# Patient Record
Sex: Female | Born: 1945
Health system: Southern US, Community
[De-identification: ages and names within clinical notes are randomized; demographics above are authoritative.]

## PROBLEM LIST (undated history)

## (undated) ENCOUNTER — Emergency Department (HOSPITAL_COMMUNITY): Admission: EM | Payer: BC Managed Care – PPO | Source: Home / Self Care

## (undated) DIAGNOSIS — M4310 Spondylolisthesis, site unspecified: Secondary | ICD-10-CM

## (undated) DIAGNOSIS — I10 Essential (primary) hypertension: Secondary | ICD-10-CM

## (undated) DIAGNOSIS — L309 Dermatitis, unspecified: Secondary | ICD-10-CM

## (undated) DIAGNOSIS — N8501 Benign endometrial hyperplasia: Secondary | ICD-10-CM

## (undated) DIAGNOSIS — I341 Nonrheumatic mitral (valve) prolapse: Secondary | ICD-10-CM

## (undated) DIAGNOSIS — H269 Unspecified cataract: Secondary | ICD-10-CM

## (undated) DIAGNOSIS — E7800 Pure hypercholesterolemia, unspecified: Secondary | ICD-10-CM

## (undated) DIAGNOSIS — M7989 Other specified soft tissue disorders: Secondary | ICD-10-CM

## (undated) DIAGNOSIS — H409 Unspecified glaucoma: Secondary | ICD-10-CM

## (undated) DIAGNOSIS — K219 Gastro-esophageal reflux disease without esophagitis: Secondary | ICD-10-CM

## (undated) DIAGNOSIS — E119 Type 2 diabetes mellitus without complications: Secondary | ICD-10-CM

## (undated) DIAGNOSIS — C801 Malignant (primary) neoplasm, unspecified: Secondary | ICD-10-CM

## (undated) HISTORY — DX: Essential (primary) hypertension: I10

## (undated) HISTORY — DX: Benign endometrial hyperplasia: N85.01

## (undated) HISTORY — DX: Unspecified glaucoma: H40.9

## (undated) HISTORY — PX: OTHER SURGICAL HISTORY: SHX169

## (undated) HISTORY — DX: Unspecified cataract: H26.9

## (undated) HISTORY — DX: Pure hypercholesterolemia, unspecified: E78.00

## (undated) HISTORY — DX: Type 2 diabetes mellitus without complications: E11.9

## (undated) HISTORY — PX: MOUTH SURGERY: SHX715

## (undated) HISTORY — DX: Spondylolisthesis, site unspecified: M43.10

---

## 2004-09-27 ENCOUNTER — Emergency Department (HOSPITAL_COMMUNITY): Admission: EM | Admit: 2004-09-27 | Discharge: 2004-09-28 | Payer: Self-pay | Admitting: Emergency Medicine

## 2004-10-15 ENCOUNTER — Encounter: Admission: RE | Admit: 2004-10-15 | Discharge: 2005-01-13 | Payer: Self-pay | Admitting: Sports Medicine

## 2005-01-14 ENCOUNTER — Encounter: Admission: RE | Admit: 2005-01-14 | Discharge: 2005-01-17 | Payer: Self-pay | Admitting: Sports Medicine

## 2010-06-12 ENCOUNTER — Encounter: Admission: RE | Admit: 2010-06-12 | Discharge: 2010-06-12 | Payer: Self-pay | Admitting: Orthopedic Surgery

## 2010-08-08 ENCOUNTER — Encounter: Admission: RE | Admit: 2010-08-08 | Discharge: 2010-08-08 | Payer: Self-pay | Admitting: Family Medicine

## 2010-09-18 ENCOUNTER — Ambulatory Visit (HOSPITAL_COMMUNITY): Admission: RE | Admit: 2010-09-18 | Discharge: 2010-09-18 | Payer: Self-pay | Admitting: Neurological Surgery

## 2011-02-05 LAB — GLUCOSE, CAPILLARY: Glucose-Capillary: 162 mg/dL — ABNORMAL HIGH (ref 70–99)

## 2011-10-02 IMAGING — CT CT L SPINE W/ CM
3 of 9 series · 12 of 33 positions shown, 14 images · non-contrast
Comparison: CT lumbar spine 08/08/2010.

CLINICAL DATA: Back and leg pain.

LUMBAR MYELOGRAM AND POST MYELOGRAM CT
MYELOGRAM LUMBAR
TECHNIQUE: Contrast was injected by Dr.Tuferu at the L1-2 level.
Contrast was negotiated into the lumbar spine without difficulty.
Fluoroscopy time:  1.1-minute
TECHNIQUE: Multidetector CT imaging of the lumbar spine was
performed following myelography.  Multiplanar CT image
reconstructions were also generated.

[Series 4: 2mm axial soft tissue · axial · 0.29mm/px · z∈[-315,-195]mm · 4 of 102 slices shown]
[im 21/102  soft-tissue]
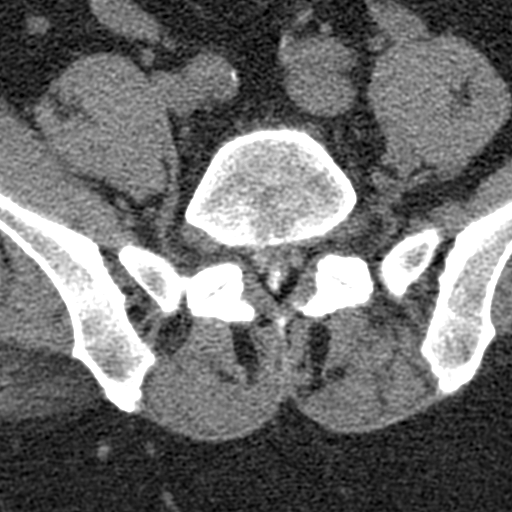
[im 41/102  soft-tissue]
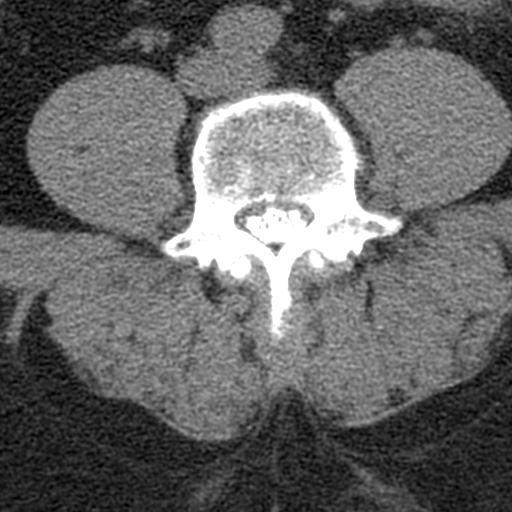
[im 61/102  soft-tissue]
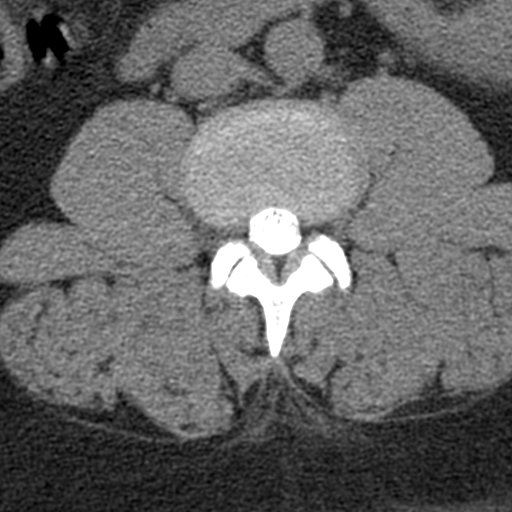
[im 81/102  soft-tissue]
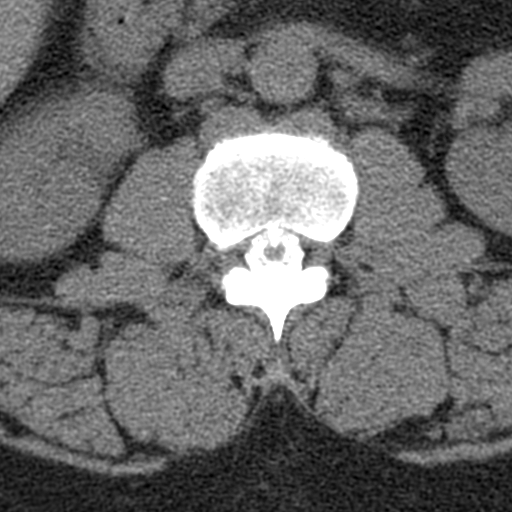

[Series 603: disc space st · axial · 0.27mm/px · z∈[-359,-253]mm · 3 of 55 slices shown, 4 images]
[im 1/55  soft-tissue]
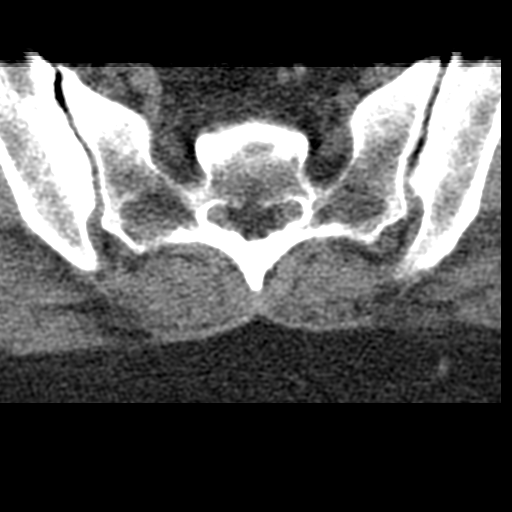
[im 1/55  bone]
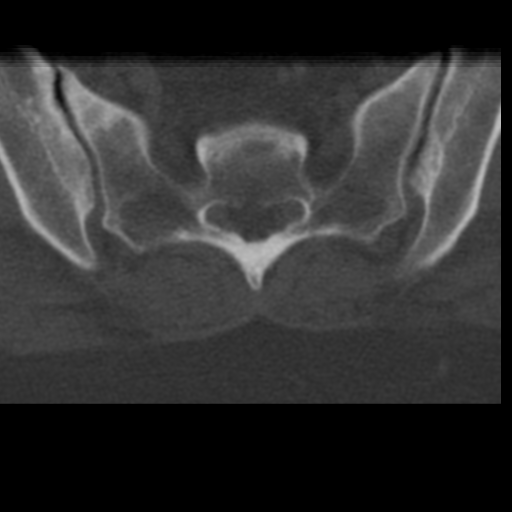
[im 28/55  bone]
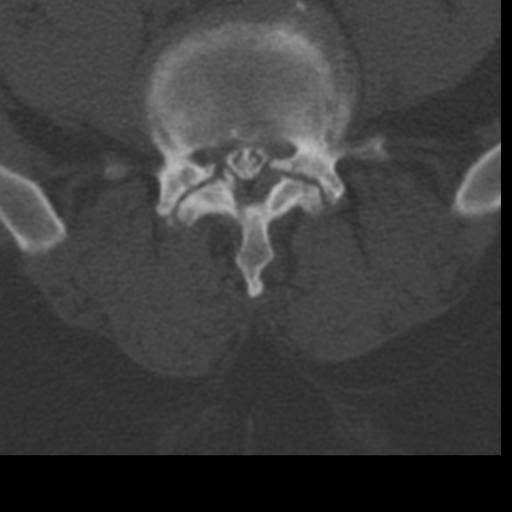
[im 55/55  bone]
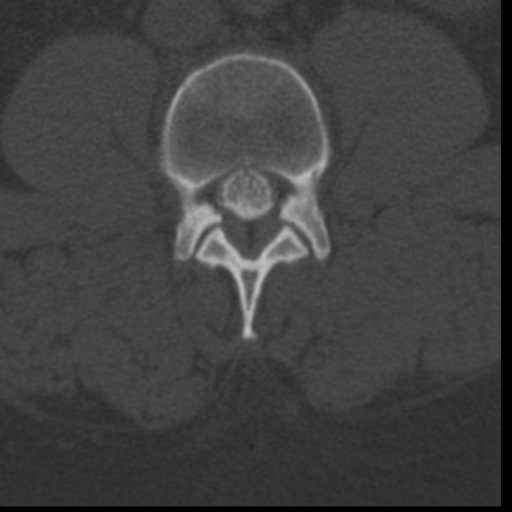

[Series 606: st sag · sagittal · 0.40mm/px · 5 of 44 slices shown, 6 images]
[im 15/44  bone]
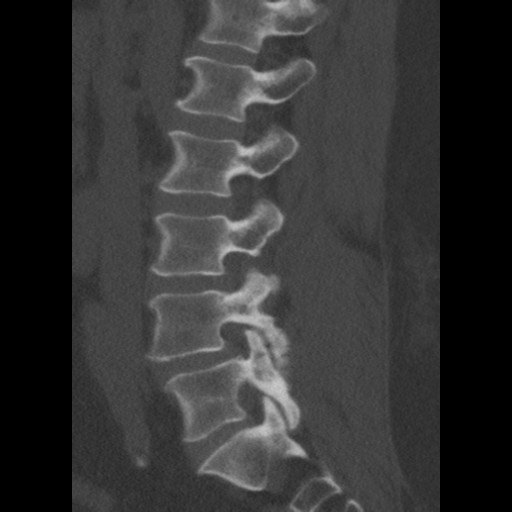
[im 18/44  bone]
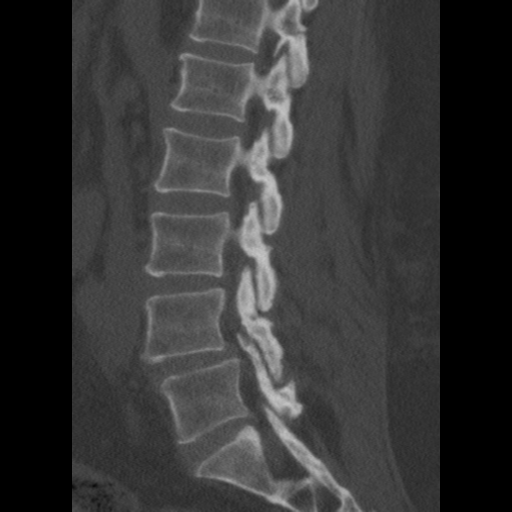
[im 22/44  soft-tissue]
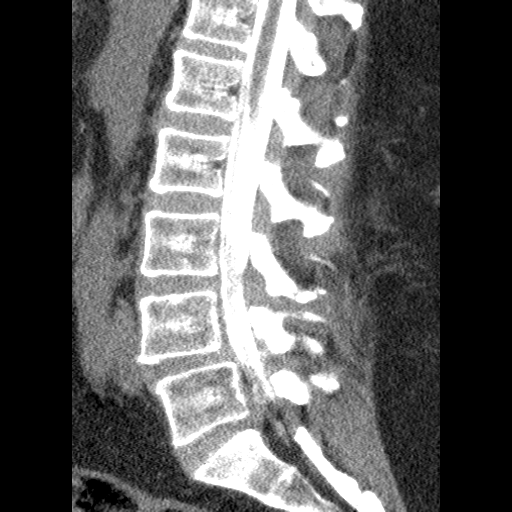
[im 22/44  bone]
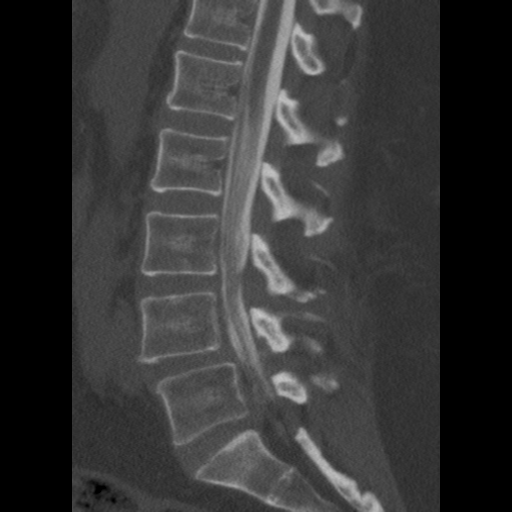
[im 26/44  bone]
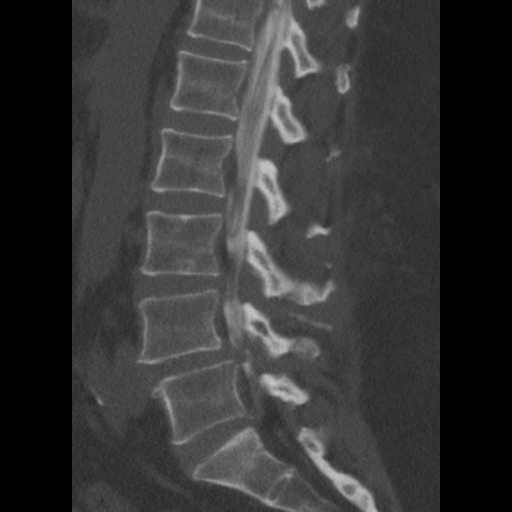
[im 29/44  bone]
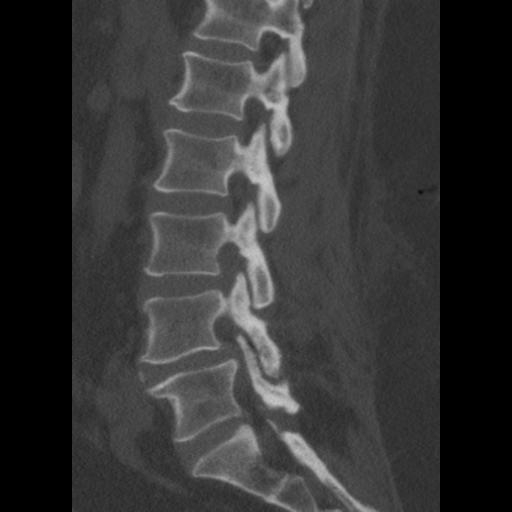

[12 of 33 positions shown; findings below may reference images not displayed]

FINDINGS: Mild anterior slip of L4.

No abnormal motion between flexion and extension.

Mild circumferential narrowing L3-4.

Impression upon the lateral aspect of the thecal sac at the L4-5
level and underfilling of the L5 nerve roots more notable on the
left.
IMPRESSION: Mild anterior slip of L4.

No abnormal motion between flexion and extension.

Mild circumferential narrowing L3-4.

Impression upon the lateral aspect of the thecal sac at the L4-5
level and underfilling of the L5 nerve roots more notable on the
left.

CT MYELOGRAPHY LUMBAR SPINE
FINDINGS: Last fully open disc space is labeled L5-S1.  Present
examination incorporates from T12 through the mid to lower sacrum.
Conus just below the L1-2 disc space.

Iliac arteries and lower abdominal aorta calcifications.

T12-L1:  Negative.

L1-2:  Negative.

L2-3:  Mild  ligamentum flavum hypertrophy.  Slight prominence
ventral epidural veins.  Mild prominence epidural fat.  Very mild
narrowing of the thecal sac.

L3-4:  Mild bilateral facet joint degenerative changes.  Moderate
ligamentum flavum hypertrophy.  Moderate bulge.  Moderate bilateral
foraminal narrowing.  Mild to moderate spinal stenosis slightly
greater on the left.

L4-5:  Moderate bilateral facet joint degenerative changes.
Anterior slip of L4 by 4.8 mm.  Ligament flavum hypertrophy.
Moderate bulge slightly greater to the left.  Moderate bilateral
lateral recess stenosis greater on the left.  Moderate spinal
stenosis.  Mild to moderate bilateral foraminal narrowing.

L5-S1:  Mild bilateral facet joint degenerative changes.  Mild
bulge.  Very mild bilateral foraminal narrowing spinal stenosis.
IMPRESSION: L3-4 mild bilateral facet joint degenerative changes.  Moderate
ligamentum flavum hypertrophy.  Moderate bulge.  Moderate bilateral
foraminal narrowing.  Mild to moderate spinal stenosis slightly
greater on the left.

L4-5 moderate bilateral facet joint degenerative changes.  Anterior
slip of L4 by 4.8 mm.  Ligament flavum hypertrophy.  Moderate bulge
slightly greater to the left.  Moderate bilateral  lateral recess
stenosis greater on the left.  Moderate spinal stenosis.  Mild to
moderate bilateral foraminal narrowing.

L5-S1 mild bulge.  Very mild bilateral foraminal narrowing spinal
stenosis.

Please see above.

## 2013-05-13 ENCOUNTER — Ambulatory Visit
Admission: RE | Admit: 2013-05-13 | Discharge: 2013-05-13 | Disposition: A | Discharge status: Home / Self Care | Payer: BC Managed Care – PPO | Source: Ambulatory Visit | Attending: Family Medicine | Admitting: Family Medicine

## 2013-05-13 ENCOUNTER — Other Ambulatory Visit: Payer: Self-pay | Admitting: Family Medicine

## 2013-05-13 DIAGNOSIS — M25572 Pain in left ankle and joints of left foot: Secondary | ICD-10-CM

## 2015-01-16 ENCOUNTER — Ambulatory Visit
Admission: RE | Admit: 2015-01-16 | Discharge: 2015-01-16 | Disposition: A | Discharge status: Home / Self Care | Payer: BC Managed Care – PPO | Source: Ambulatory Visit | Attending: Family Medicine | Admitting: Family Medicine

## 2015-01-16 ENCOUNTER — Other Ambulatory Visit: Payer: Self-pay | Admitting: Family Medicine

## 2015-01-16 DIAGNOSIS — G542 Cervical root disorders, not elsewhere classified: Secondary | ICD-10-CM

## 2016-05-14 ENCOUNTER — Ambulatory Visit: Payer: Self-pay | Admitting: Gynecology

## 2016-06-06 ENCOUNTER — Encounter: Payer: Self-pay | Admitting: Gynecology

## 2016-06-06 ENCOUNTER — Ambulatory Visit (INDEPENDENT_AMBULATORY_CARE_PROVIDER_SITE_OTHER): Payer: BC Managed Care – PPO | Admitting: Gynecology

## 2016-06-06 VITALS — BP 124/76 | Ht 59.0 in | Wt 222.0 lb

## 2016-06-06 DIAGNOSIS — Z124 Encounter for screening for malignant neoplasm of cervix: Secondary | ICD-10-CM

## 2016-06-06 DIAGNOSIS — N841 Polyp of cervix uteri: Secondary | ICD-10-CM

## 2016-06-06 DIAGNOSIS — N950 Postmenopausal bleeding: Secondary | ICD-10-CM | POA: Diagnosis not present

## 2016-06-06 DIAGNOSIS — N9089 Other specified noninflammatory disorders of vulva and perineum: Secondary | ICD-10-CM | POA: Diagnosis not present

## 2016-06-06 NOTE — Progress Notes (Signed)
Erica [name] [birth year] [id number]        70 y.o.  G2P2  new patient referred due to postmenopausal bleeding. Patient notes it's been years since she has had a gynecologic exam. Over the past 8 months or so she has noticed spotting on and off. Light, never heavy and no associated cramping or pelvic discomfort. Also notes a growth on her left perineal region. Has progressively gotten larger over the last several months. No history of significant gynecologic abnormalities in the past.  No significant menopausal symptoms such as hot flushes night sweats or vaginal dryness.  Had ultrasound ordered by her primary physician due to the postmenopausal bleeding which shows the uterus with a 9 mm endometrial echo. Right left ovaries not visualized. No adnexal pathology noted.  Past medical history,surgical history, problem list, medications, allergies, family history and social history were all reviewed and documented as reviewed in the EPIC chart.  ROS:  Performed with pertinent positives and negatives included in the history, assessment and plan.   Additional significant findings :  None   Exam: [name] assistant Filed Vitals:   06/06/16 1032  BP: 124/76  Height: 4\' 11"  (1.499 m)  Weight: 222 lb (100.699 kg)   General appearance:  Normal affect, orientation and appearance. Skin: Grossly normal HEENT: Without gross lesions.  No cervical or supraclavicular adenopathy. Thyroid normal.  Lungs:  Clear without wheezing, rales or rhonchi Cardiac: RR, without RMG Abdominal:  Soft, nontender, without masses, guarding, rebound, organomegaly or hernia Breasts:  Without masses, retractions, discharge or axillary adenopathy. Pelvic:  Ext/BUS/Vagina with atrophic changes. 3 cm pedunculated fleshy mass crease of the left gluteal fold and thigh consistent with lipoma.  Cervix with endocervical polyp extruding from the os. Polyp was biopsied off and sent to pathology. Pap smear done  Uterus unable  to palpate due to abdominal girth but no gross masses or tenderness   Adnexa without gross masses or tenderness    Anus and perineum normal   Rectovaginal normal sphincter tone without palpated masses or tenderness.    Assessment/Plan:  70 y.o. G2P2 female with history as above.  1. Postmenopausal bleeding. The question is whether this is from the cervical polyp or is from other pathology higher up. Endometrial echo generous at 9 mm. Recommended follow up with sonohysterogram to rule out endometrial pathology and patient agrees to schedule. Various scenarios and differential reviewed to include hyperplasia and cancer. Patient will schedule her sonohysterogram and follow up for this.  We will call the patient with the endocervical polyp pathology results in several days. 2. Fleshy mass left crease of groin consistent with lipoma. Bothersome to the patient. Will plan to excise either intraoperatively if patient and supple ultimately having hysteroscopy D&C or in the office if we clear her endometrium. Will require several sutures. 3. Pap smear done today. No history of abnormal Pap smears previously. Patient reports it has been "years" since her last Pap smear. 4. Mammography 10/2015. Continue with annual mammography when due. SBE monthly reviewed. 5. Colonoscopy never. I strongly recommended patient schedule a screening colonoscopy. Second-most common cancer in women stressed. Patient acknowledges my recommendation. She will not commit to scheduling now though. 6. DEXA never. Offered the schedule today but patient declined. Again she understands my recommendation but does not commit to schedule at this point. 7. Health maintenance. No routine lab work done as this is done elsewhere. Follow up for the sonohysterogram as scheduled and the cervical polyp pathology results.  Anastasio Auerbach MD, 11:15 AM 06/06/2016

## 2016-06-06 NOTE — Addendum Note (Signed)
Addended by: Nelva Nay on: 06/06/2016 12:04 PM   Modules accepted: Orders

## 2016-06-06 NOTE — Patient Instructions (Signed)
Follow up for the sonohysterogram as scheduled.  Office will call you with the biopsy results from the cervical polyp I removed.

## 2016-06-10 LAB — PATHOLOGY

## 2016-06-10 LAB — PAP IG W/ RFLX HPV ASCU

## 2016-06-11 ENCOUNTER — Other Ambulatory Visit: Payer: Self-pay | Admitting: Gynecology

## 2016-06-11 DIAGNOSIS — N950 Postmenopausal bleeding: Secondary | ICD-10-CM

## 2016-06-17 ENCOUNTER — Telehealth: Payer: Self-pay | Admitting: Gynecology

## 2016-06-17 NOTE — Telephone Encounter (Signed)
06/17/16-I tried to reach pt today but no answering machine on home phone. I wanted to advised her that her [facility] ins will cover the sonohysterogram/bx if needed with her $25 copay. Per [email or web address] [phone]

## 2016-06-27 ENCOUNTER — Ambulatory Visit (INDEPENDENT_AMBULATORY_CARE_PROVIDER_SITE_OTHER): Payer: BC Managed Care – PPO

## 2016-06-27 ENCOUNTER — Encounter: Payer: Self-pay | Admitting: Gynecology

## 2016-06-27 ENCOUNTER — Ambulatory Visit (INDEPENDENT_AMBULATORY_CARE_PROVIDER_SITE_OTHER): Payer: BC Managed Care – PPO | Admitting: Gynecology

## 2016-06-27 ENCOUNTER — Other Ambulatory Visit: Payer: Self-pay | Admitting: Gynecology

## 2016-06-27 VITALS — BP 140/84

## 2016-06-27 DIAGNOSIS — N9489 Other specified conditions associated with female genital organs and menstrual cycle: Secondary | ICD-10-CM | POA: Diagnosis not present

## 2016-06-27 DIAGNOSIS — R9389 Abnormal findings on diagnostic imaging of other specified body structures: Secondary | ICD-10-CM

## 2016-06-27 DIAGNOSIS — N950 Postmenopausal bleeding: Secondary | ICD-10-CM

## 2016-06-27 DIAGNOSIS — R938 Abnormal findings on diagnostic imaging of other specified body structures: Secondary | ICD-10-CM | POA: Diagnosis not present

## 2016-06-27 DIAGNOSIS — N840 Polyp of corpus uteri: Secondary | ICD-10-CM

## 2016-06-27 DIAGNOSIS — L918 Other hypertrophic disorders of the skin: Secondary | ICD-10-CM

## 2016-06-27 NOTE — Progress Notes (Signed)
    Erica [name] 01/24/1946 [id number]        70 y.o.  G2P2 presents for sonohysterogram. History of post menopausal bleeding with ultrasound showing thickened endometrial echo. Recently had mixed endometrial/endocervical polyp removed from her cervix. Also has a large pedunculated skin tag from her inner upper left thigh that she wants removed.  Past medical history,surgical history, problem list, medications, allergies, family history and social history were all reviewed and documented in the EPIC chart.  Directed ROS with pertinent positives and negatives documented in the history of present illness/assessment and plan.  Exam: Erica [name] assistant Vitals:   06/27/16 1256  BP: 140/84   General appearance:  Normal Abdomen obese with no gross masses or tenderness Elevate external BUS vagina atrophic and tight admitting the thin long narrow speculum. Cervix atrophic. Uterus unable to palpate. Adnexa without gross masses or tenderness  Ultrasound shows uterus small with heterogeneous echo pattern. Endometrial echo 12 mm. Right and left ovaries atrophic and normal in appearance. Cul-de-sac negative.  Sonohysterogram performed, sterile technique, single-tooth tenaculum anterior lip stabilization, easy catheter introduction, good distention with 22 x 18 x 25 mm solid-appearing mass within the cavity consistent with polyp. Endometrial sample taken. Take patient tolerated well  Assessment/Plan:  70 y.o. G2P2 with postmenopausal bleeding and probable large endometrial polyp. Patient will follow up for biopsy results. Recommended proceeding with hysteroscopy D&C with resection of the polyp. Also resection of the large upper inner thigh polypoid mass. I reviewed in general's involved with both of these procedures and postoperative stitches discussed. Patient will follow up for pathology results. Patient will proceed to schedule her surgery. We'll have preoperative clearance by her primary  physician.    Anastasio Auerbach MD, 1:06 PM 06/27/2016

## 2016-06-27 NOTE — Patient Instructions (Signed)
Office will call you with biopsy results and to arrange surgery. 

## 2016-07-01 ENCOUNTER — Telehealth: Payer: Self-pay

## 2016-07-01 NOTE — Telephone Encounter (Signed)
I called and left message for patient to call me about scheduling surgery. 

## 2016-07-02 ENCOUNTER — Telehealth: Payer: Self-pay

## 2016-07-02 MED ORDER — MISOPROSTOL 200 MCG PO TABS: ORAL_TABLET | ORAL | 0 refills | Status: DC

## 2016-07-02 NOTE — Telephone Encounter (Signed)
I called patient to inform her that I scheduled her appt with her PCP Dr. Minette Brine for 07/13/16 at 12:15pm. Records have been faxed.    I am holding time for her surgeyr on Tues., Sept 12 and will determine time closer to date and let her know.   Pre op appt with Dr. Loetta Rough was scheduled.  I advised patient about the need for Cytotec tab vaginally hs before surgery and that Rx was sent to her pharmacy.  I did let her know that she might experience some cramping or light bleeding after using the Cytotec that evening and that is not a problem and just indicates the medication is working.

## 2016-07-10 ENCOUNTER — Telehealth: Payer: Self-pay

## 2016-07-10 NOTE — Telephone Encounter (Signed)
Patient called with some general questions about surgery that is scheduled. Questions were answered.

## 2016-07-25 DIAGNOSIS — N8501 Benign endometrial hyperplasia: Secondary | ICD-10-CM

## 2016-07-25 HISTORY — DX: Benign endometrial hyperplasia: N85.01

## 2016-07-30 ENCOUNTER — Telehealth: Payer: Self-pay

## 2016-07-30 ENCOUNTER — Ambulatory Visit (INDEPENDENT_AMBULATORY_CARE_PROVIDER_SITE_OTHER): Payer: BC Managed Care – PPO | Admitting: Gynecology

## 2016-07-30 VITALS — BP 134/80

## 2016-07-30 DIAGNOSIS — N840 Polyp of corpus uteri: Secondary | ICD-10-CM | POA: Diagnosis not present

## 2016-07-30 DIAGNOSIS — N950 Postmenopausal bleeding: Secondary | ICD-10-CM | POA: Diagnosis not present

## 2016-07-30 DIAGNOSIS — L989 Disorder of the skin and subcutaneous tissue, unspecified: Secondary | ICD-10-CM | POA: Diagnosis not present

## 2016-07-30 MED ORDER — OXYCODONE-ACETAMINOPHEN 5-325 MG PO TABS: 1.0000 | ORAL_TABLET | ORAL | 0 refills | Status: DC | PRN

## 2016-07-30 NOTE — Progress Notes (Signed)
Erica [name] 11-28-1945 [id number]   Preoperative consult  Chief complaint: Post menopausal bleeding, endometrial polyp, lipomatous skin mass left thigh  History of present illness: 70 y.o. G2P2 with history of postmenopausal bleeding over the past 9 months spotting on and off. Initial evaluation showed a cervical polyp which was removed and histologically was a mixed endocervical endometrial polyp.  Pap smear was also done which was normal.  She had a follow up sonohysterogram which showed a 22 x 18 x 25 mm solid-appearing mass intracavitary consistent with polyp.  Endometrial biopsy showed polypoid cystic atrophy.  Patient is admitted for hysteroscopy D&C with resection of the endometrial polyp. She also has a 3 cm fleshy pedunculated lipomatous mass upper inner left thigh that she would like removed. Has been present for years unchanged.  Past medical history,surgical history, medications, allergies, family history and social history were all reviewed and documented in the EPIC chart.  ROS:  Was performed and pertinent positives and negatives are included in the history of present illness.  Exam:  [name] assistant Vitals:   07/30/16 1030  BP: 134/80   General: well developed, well nourished female, no acute distress HEENT: normal  Lungs: clear to auscultation without wheezing, rales or rhonchi  Cardiac: regular rate without rubs, murmurs or gallops  Abdomen: soft, nontender without masses, guarding, rebound, organomegaly  Pelvic: external bus vagina: normal excepting 3 cm pedunculated fleshy lipomatous mass upper inner left thigh Cervix: grossly normal  Uterus: Difficult to palpate due to abdominal girth. No gross masses or tenderness  Adnexa: without gross masses or tenderness    Assessment/Plan:  70 y.o. G2P2 with postmenopausal bleeding initial evaluation showing a mixed endocervical/endometrial polyp. Follow up sonohysterogram showing a large endometrial polyp for  hysteroscopy D&C and resection of the endometrial polyp. Also has fleshy lipomatous mass upper inner left thigh that she would like removed.  I reviewed the proposed surgery with the patient to include the expected intraoperative and postoperative courses as well as the recovery period. The use of the hysteroscope, resectoscope and the D&C portion were all discussed. Excision of the mass with subsequent sutures and the need for postoperative care was discussed. The risks of surgery to include infection, prolonged antibiotics, hemorrhage necessitating transfusion and the risks of transfusion, including transfusion reaction, hepatitis, HIV, mad cow disease and other unknown entities were all discussed understood and accepted. The risk of damage to internal organs during the procedure, either immediately recognized or delay recognized, including vagina, cervix, uterus, possible perforation causing damage to bowel, bladder, ureters, vessels and nerves necessitating major exploratory reparative surgery and future reparative surgeries including bladder repair, ureteral damage repair, bowel resection, ostomy formation was also discussed understood and accepted. The potential for distended media absorption leading to metabolic complications such as fluid overload, coma and seizures was also discussed understood and accepted. She understands there are no guarantees that this will relieve her bleeding which may continue. The patient's questions were answered to her satisfaction and she is ready to proceed with surgery.  She was given a prescription for oxycodone/acetaminophen 5/325 #10 to have available for postoperative pain. Patient also saw her primary physician for preoperative clearance.    Anastasio Auerbach MD, 10:50 AM 07/30/2016

## 2016-07-30 NOTE — Telephone Encounter (Signed)
I called patient and per DPR access note left a detailed message on her work phone.  I called today to [place] to see if CPT codes [phone] and 11200 required prior authorization.  I was told that [phone] was a valid and billable code with no prior auth required but that 11200 was an uncovered service. Patient was informed of this.

## 2016-07-30 NOTE — Patient Instructions (Signed)
Your procedure is scheduled on:  Tuesday, Sept . 12, 2017  Enter through the [employer] [facility] at:  6:00 AM  Pick up the phone at the desk and dial [phone].  Call this number if you have problems the morning of surgery: [phone].  Remember: Do NOT eat food or drink after:  Midnight Monday  Take these medicines the morning of surgery with a SIP OF WATER:  Triamterene  Do NOT wear jewelry (body piercing), metal hair clips/bobby pins, make-up, or nail polish. Do NOT wear lotions, powders, or perfumes.  You may wear deodorant. Do NOT shave for 48 hours prior to surgery. Do NOT bring valuables to the hospital. Contacts, dentures, or bridgework may not be worn into surgery.  Have a responsible adult drive you home and stay with you for 24 hours after your procedure

## 2016-07-30 NOTE — H&P (Signed)
  Erica [name] 04-May-1946 [id number]   History and Physical  Chief complaint: Post menopausal bleeding, endometrial polyp, lipomatous skin mass left thigh  History of present illness: 70 y.o. G2P2 with history of postmenopausal bleeding over the past 9 months spotting on and off. Initial evaluation showed a cervical polyp which was removed and histologically was a mixed endocervical endometrial polyp.  Pap smear was also done which was normal.  She had a follow up sonohysterogram which showed a 22 x 18 x 25 mm solid-appearing mass intracavitary consistent with polyp.  Endometrial biopsy showed polypoid cystic atrophy.  Patient is admitted for hysteroscopy D&C with resection of the endometrial polyp. She also has a 3 cm fleshy pedunculated lipomatous mass upper inner left thigh that she would like removed. Has been present for years unchanged.  Past medical history,surgical history, medications, allergies, family history and social history were all reviewed and documented in the EPIC chart.  ROS:  Was performed and pertinent positives and negatives are included in the history of present illness.  Exam:  [name] assistant 07/30/2016 Vitals:   07/30/16 1030  BP: 134/80   General: well developed, well nourished female, no acute distress HEENT: normal  Lungs: clear to auscultation without wheezing, rales or rhonchi  Cardiac: regular rate without rubs, murmurs or gallops  Abdomen: soft, nontender without masses, guarding, rebound, organomegaly  Pelvic: external bus vagina: normal excepting 3 cm pedunculated fleshy lipomatous mass upper inner left thigh Cervix: grossly normal  Uterus: Difficult to palpate due to abdominal girth. No gross masses or tenderness  Adnexa: without gross masses or tenderness    Assessment/Plan:  70 y.o. G2P2 with postmenopausal bleeding initial evaluation showing a mixed endocervical/endometrial polyp. Follow up sonohysterogram showing a large endometrial polyp  for hysteroscopy D&C and resection of the endometrial polyp. Also has fleshy lipomatous mass upper inner left thigh that she would like removed.  I reviewed the proposed surgery with the patient to include the expected intraoperative and postoperative courses as well as the recovery period. The use of the hysteroscope, resectoscope and the D&C portion were all discussed. Excision of the mass with subsequent sutures and the need for postoperative care was discussed. The risks of surgery to include infection, prolonged antibiotics, hemorrhage necessitating transfusion and the risks of transfusion, including transfusion reaction, hepatitis, HIV, mad cow disease and other unknown entities were all discussed understood and accepted. The risk of damage to internal organs during the procedure, either immediately recognized or delay recognized, including vagina, cervix, uterus, possible perforation causing damage to bowel, bladder, ureters, vessels and nerves necessitating major exploratory reparative surgery and future reparative surgeries including bladder repair, ureteral damage repair, bowel resection, ostomy formation was also discussed understood and accepted. The potential for distended media absorption leading to metabolic complications such as fluid overload, coma and seizures was also discussed understood and accepted. She understands there are no guarantees that this will relieve her bleeding which may continue. The patient's questions were answered to her satisfaction and she is ready to proceed with surgery.  She was given a prescription for oxycodone/acetaminophen 5/325 #10 to have available for postoperative pain. Patient also saw her primary physician for preoperative clearance.   Anastasio Auerbach MD, 10:58 AM 07/30/2016

## 2016-07-30 NOTE — Patient Instructions (Signed)
Followup for surgery as scheduled. 

## 2016-07-31 ENCOUNTER — Telehealth: Payer: Self-pay

## 2016-07-31 ENCOUNTER — Other Ambulatory Visit: Payer: Self-pay

## 2016-07-31 ENCOUNTER — Encounter (HOSPITAL_COMMUNITY): Payer: Self-pay

## 2016-07-31 ENCOUNTER — Encounter (HOSPITAL_COMMUNITY)
Admission: RE | Admit: 2016-07-31 | Discharge: 2016-07-31 | Disposition: A | Discharge status: Home / Self Care | Payer: BC Managed Care – PPO | Source: Ambulatory Visit | Attending: Gynecology | Admitting: Gynecology

## 2016-07-31 DIAGNOSIS — Z01818 Encounter for other preprocedural examination: Secondary | ICD-10-CM | POA: Diagnosis present

## 2016-07-31 HISTORY — DX: Nonrheumatic mitral (valve) prolapse: I34.1

## 2016-07-31 HISTORY — DX: Gastro-esophageal reflux disease without esophagitis: K21.9

## 2016-07-31 HISTORY — DX: Other specified soft tissue disorders: M79.89

## 2016-07-31 HISTORY — DX: Dermatitis, unspecified: L30.9

## 2016-07-31 LAB — COMPREHENSIVE METABOLIC PANEL
ALT: 30 U/L (ref 14–54)
ANION GAP: 9 (ref 5–15)
AST: 26 U/L (ref 15–41)
Albumin: 4.6 g/dL (ref 3.5–5.0)
Alkaline Phosphatase: 63 U/L (ref 38–126)
BILIRUBIN TOTAL: 0.5 mg/dL (ref 0.3–1.2)
BUN: 13 mg/dL (ref 6–20)
CHLORIDE: 99 mmol/L — AB (ref 101–111)
CO2: 27 mmol/L (ref 22–32)
Calcium: 9.7 mg/dL (ref 8.9–10.3)
Creatinine, Ser: 0.75 mg/dL (ref 0.44–1.00)
Glucose, Bld: 285 mg/dL — ABNORMAL HIGH (ref 65–99)
POTASSIUM: 3.7 mmol/L (ref 3.5–5.1)
Sodium: 135 mmol/L (ref 135–145)
TOTAL PROTEIN: 7.5 g/dL (ref 6.5–8.1)

## 2016-07-31 LAB — CBC
HCT: 43.8 % (ref 36.0–46.0)
Hemoglobin: 15 g/dL (ref 12.0–15.0)
MCH: 28.6 pg (ref 26.0–34.0)
MCHC: 34.2 g/dL (ref 30.0–36.0)
MCV: 83.4 fL (ref 78.0–100.0)
PLATELETS: 183 10*3/uL (ref 150–400)
RBC: 5.25 MIL/uL — ABNORMAL HIGH (ref 3.87–5.11)
RDW: 13.1 % (ref 11.5–15.5)
WBC: 7.8 10*3/uL (ref 4.0–10.5)

## 2016-07-31 NOTE — Telephone Encounter (Signed)
Erica [name] saw this patient for pre op today. Hyst D&C and removal of lesion on thigh is scheduled for 08/05/16.   Patient told her she is a diet controlled diabetic.  Her random glucose today was 285.  I have put the note from her PCP on your desk to remind you.  He said "she tends to have poor compliance with her medications" HgbA1c was 11.1. "she promises she will work hard to take her medications regularly between now and her procedure." "I think she can be medically cleared for surgery, although her blood sugar should be closely monitored during admission".

## 2016-07-31 NOTE — Telephone Encounter (Signed)
I think she should be okay as this is a relatively minor procedure.

## 2016-07-31 NOTE — Pre-Procedure Instructions (Addendum)
Dr. Sharlene Motts made aware of Erica [name] glucose elevated glucose. He did not recommend surgery cancellation, however he does want her to follow up with her primary care provider for better glucose control long term. I also left a message for Juliann Pulse at Dr. Zelphia Cairo office to make them aware as well.

## 2016-08-01 NOTE — Pre-Procedure Instructions (Signed)
Erica [name] returned call that she did receive the information in reference to Erica [name]'s elevated glucose.  She has faxed over medical clearance from primary care provider, that clearance has been place in Erica [name]'s chart.

## 2016-08-04 MED ORDER — METRONIDAZOLE IN NACL 5-0.79 MG/ML-% IV SOLN
500.0000 mg | INTRAVENOUS | Status: AC
  Administered 2016-08-05: 500 mg via INTRAVENOUS
  Filled 2016-08-04: qty 100

## 2016-08-04 MED ORDER — GENTAMICIN SULFATE 40 MG/ML IJ SOLN
5.0000 mg/kg | INTRAVENOUS | Status: AC
  Administered 2016-08-05: 112.5 mg via INTRAVENOUS
  Filled 2016-08-04: qty 12.25

## 2016-08-05 ENCOUNTER — Ambulatory Visit (HOSPITAL_COMMUNITY)
Admission: RE | Admit: 2016-08-05 | Discharge: 2016-08-05 | Disposition: A | Discharge status: Home / Self Care | Payer: BC Managed Care – PPO | Source: Ambulatory Visit | Attending: Gynecology | Admitting: Gynecology

## 2016-08-05 ENCOUNTER — Ambulatory Visit (HOSPITAL_COMMUNITY): Payer: BC Managed Care – PPO | Admitting: Anesthesiology

## 2016-08-05 ENCOUNTER — Encounter (HOSPITAL_COMMUNITY)
Admission: RE | Disposition: A | Discharge status: Home / Self Care | Payer: Self-pay | Source: Ambulatory Visit | Attending: Gynecology

## 2016-08-05 DIAGNOSIS — L989 Disorder of the skin and subcutaneous tissue, unspecified: Secondary | ICD-10-CM | POA: Diagnosis not present

## 2016-08-05 DIAGNOSIS — N950 Postmenopausal bleeding: Secondary | ICD-10-CM | POA: Diagnosis not present

## 2016-08-05 DIAGNOSIS — Z6841 Body Mass Index (BMI) 40.0 and over, adult: Secondary | ICD-10-CM | POA: Diagnosis not present

## 2016-08-05 DIAGNOSIS — L918 Other hypertrophic disorders of the skin: Secondary | ICD-10-CM | POA: Insufficient documentation

## 2016-08-05 DIAGNOSIS — N840 Polyp of corpus uteri: Secondary | ICD-10-CM | POA: Insufficient documentation

## 2016-08-05 HISTORY — PX: DILATATION & CURETTAGE/HYSTEROSCOPY WITH MYOSURE: SHX6511

## 2016-08-05 HISTORY — PX: EXCISION OF SKIN TAG: SHX6270

## 2016-08-05 LAB — GLUCOSE, CAPILLARY
Glucose-Capillary: 273 mg/dL — ABNORMAL HIGH (ref 65–99)
Glucose-Capillary: 243 mg/dL — ABNORMAL HIGH (ref 65–99)
Glucose-Capillary: 250 mg/dL — ABNORMAL HIGH (ref 65–99)

## 2016-08-05 SURGERY — DILATATION & CURETTAGE/HYSTEROSCOPY WITH MYOSURE
Anesthesia: General | Site: Uterus

## 2016-08-05 MED ORDER — INSULIN ASPART 100 UNIT/ML ~~LOC~~ SOLN
SUBCUTANEOUS | Status: AC
  Filled 2016-08-05: qty 1

## 2016-08-05 MED ORDER — ONDANSETRON HCL 4 MG/2ML IJ SOLN
INTRAMUSCULAR | Status: AC
  Filled 2016-08-05: qty 2

## 2016-08-05 MED ORDER — LIDOCAINE-EPINEPHRINE (PF) 1 %-1:200000 IJ SOLN
INTRAMUSCULAR | Status: AC
  Filled 2016-08-05: qty 30

## 2016-08-05 MED ORDER — INSULIN ASPART 100 UNIT/ML ~~LOC~~ SOLN
SUBCUTANEOUS | Status: DC | PRN
  Administered 2016-08-05: 8 [IU] via SUBCUTANEOUS

## 2016-08-05 MED ORDER — KETOROLAC TROMETHAMINE 30 MG/ML IJ SOLN
INTRAMUSCULAR | Status: AC
  Filled 2016-08-05: qty 1

## 2016-08-05 MED ORDER — PROMETHAZINE HCL 25 MG/ML IJ SOLN
6.2500 mg | INTRAMUSCULAR | Status: DC | PRN
Start: 2016-08-05 — End: 2016-08-05

## 2016-08-05 MED ORDER — PROPOFOL 10 MG/ML IV BOLUS
INTRAVENOUS | Status: DC | PRN
  Administered 2016-08-05 (×2): 100 mg via INTRAVENOUS

## 2016-08-05 MED ORDER — FENTANYL CITRATE (PF) 100 MCG/2ML IJ SOLN
INTRAMUSCULAR | Status: AC
  Filled 2016-08-05: qty 2

## 2016-08-05 MED ORDER — LIDOCAINE-EPINEPHRINE 1 %-1:100000 IJ SOLN
INTRAMUSCULAR | Status: AC
  Filled 2016-08-05: qty 1

## 2016-08-05 MED ORDER — PROPOFOL 10 MG/ML IV BOLUS
INTRAVENOUS | Status: AC
  Filled 2016-08-05: qty 20

## 2016-08-05 MED ORDER — LIDOCAINE HCL (CARDIAC) 20 MG/ML IV SOLN
INTRAVENOUS | Status: AC
  Filled 2016-08-05: qty 5

## 2016-08-05 MED ORDER — LIDOCAINE HCL 1 % IJ SOLN
INTRAMUSCULAR | Status: AC
  Filled 2016-08-05: qty 20

## 2016-08-05 MED ORDER — ONDANSETRON HCL 4 MG/2ML IJ SOLN
INTRAMUSCULAR | Status: DC | PRN
  Administered 2016-08-05: 4 mg via INTRAVENOUS

## 2016-08-05 MED ORDER — OXYCODONE HCL 5 MG/5ML PO SOLN: 5.0000 mg | Freq: Once | ORAL | Status: DC | PRN

## 2016-08-05 MED ORDER — KETOROLAC TROMETHAMINE 30 MG/ML IJ SOLN
INTRAMUSCULAR | Status: DC | PRN
  Administered 2016-08-05: 15 mg via INTRAVENOUS

## 2016-08-05 MED ORDER — FENTANYL CITRATE (PF) 100 MCG/2ML IJ SOLN
INTRAMUSCULAR | Status: DC | PRN
  Administered 2016-08-05 (×3): 50 ug via INTRAVENOUS

## 2016-08-05 MED ORDER — MIDAZOLAM HCL 2 MG/2ML IJ SOLN
INTRAMUSCULAR | Status: AC
  Filled 2016-08-05: qty 2

## 2016-08-05 MED ORDER — FENTANYL CITRATE (PF) 100 MCG/2ML IJ SOLN: 25.0000 ug | INTRAMUSCULAR | Status: DC | PRN

## 2016-08-05 MED ORDER — SODIUM CHLORIDE 0.9 % IR SOLN
Status: DC | PRN
  Administered 2016-08-05: 3000 mL

## 2016-08-05 MED ORDER — OXYCODONE HCL 5 MG PO TABS: 5.0000 mg | ORAL_TABLET | Freq: Once | ORAL | Status: DC | PRN

## 2016-08-05 MED ORDER — SCOPOLAMINE 1 MG/3DAYS TD PT72: 1.0000 | MEDICATED_PATCH | Freq: Once | TRANSDERMAL | Status: DC

## 2016-08-05 MED ORDER — LIDOCAINE HCL 1 % IJ SOLN
INTRAMUSCULAR | Status: DC | PRN
Start: 2016-08-05 — End: 2016-08-05
  Administered 2016-08-05: 10 mL

## 2016-08-05 MED ORDER — LACTATED RINGERS IV SOLN
INTRAVENOUS | Status: DC
  Administered 2016-08-05 (×2): via INTRAVENOUS

## 2016-08-05 MED ORDER — MIDAZOLAM HCL 2 MG/2ML IJ SOLN
INTRAMUSCULAR | Status: DC | PRN
  Administered 2016-08-05: 1 mg via INTRAVENOUS

## 2016-08-05 SURGICAL SUPPLY — 19 items
CATH ROBINSON RED A/P 16FR (CATHETERS) ×4 IMPLANT
CLOTH BEACON ORANGE TIMEOUT ST (SAFETY) ×4 IMPLANT
CONTAINER PREFILL 10% NBF 60ML (FORM) ×8 IMPLANT
DEVICE MYOSURE LITE (MISCELLANEOUS) IMPLANT
DEVICE MYOSURE REACH (MISCELLANEOUS) ×4 IMPLANT
FILTER ARTHROSCOPY CONVERTOR (FILTER) ×4 IMPLANT
GLOVE BIO SURGEON STRL SZ7.5 (GLOVE) ×8 IMPLANT
GLOVE BIOGEL PI IND STRL 7.0 (GLOVE) ×2 IMPLANT
GLOVE BIOGEL PI INDICATOR 7.0 (GLOVE) ×2
GOWN STRL REUS W/TWL LRG LVL3 (GOWN DISPOSABLE) ×8 IMPLANT
PACK VAGINAL MINOR WOMEN LF (CUSTOM PROCEDURE TRAY) ×4 IMPLANT
PAD OB MATERNITY 4.3X12.25 (PERSONAL CARE ITEMS) ×4 IMPLANT
SEAL ROD LENS SCOPE MYOSURE (ABLATOR) ×4 IMPLANT
SUT VIC AB 3-0 SH 27 (SUTURE) ×2
SUT VIC AB 3-0 SH 27X BRD (SUTURE) ×2 IMPLANT
TOWEL OR 17X24 6PK STRL BLUE (TOWEL DISPOSABLE) ×8 IMPLANT
TUBING AQUILEX INFLOW (TUBING) ×4 IMPLANT
TUBING AQUILEX OUTFLOW (TUBING) ×4 IMPLANT
WATER STERILE IRR 1000ML POUR (IV SOLUTION) ×4 IMPLANT

## 2016-08-05 NOTE — Anesthesia Preprocedure Evaluation (Signed)
Anesthesia Evaluation  Patient identified by MRN, date of birth, ID band Patient awake    Reviewed: Allergy & Precautions, NPO status , Patient's Chart, lab work & pertinent test results  Airway Mallampati: II  TM Distance: >3 FB Neck ROM: Full    Dental no notable dental hx.    Pulmonary neg pulmonary ROS,    Pulmonary exam normal breath sounds clear to auscultation       Cardiovascular hypertension, Normal cardiovascular exam Rhythm:Regular Rate:Normal     Neuro/Psych negative neurological ROS  negative psych ROS   GI/Hepatic negative GI ROS, Neg liver ROS,   Endo/Other  diabetesMorbid obesity  Renal/GU negative Renal ROS  negative genitourinary   Musculoskeletal negative musculoskeletal ROS (+)   Abdominal   Peds negative pediatric ROS (+)  Hematology negative hematology ROS (+)   Anesthesia Other Findings   Reproductive/Obstetrics negative OB ROS                             Anesthesia Physical Anesthesia Plan  ASA: III  Anesthesia Plan: General   Post-op Pain Management:    Induction: Intravenous  Airway Management Planned: LMA  Additional Equipment:   Intra-op Plan:   Post-operative Plan: Extubation in OR  Informed Consent: I have reviewed the patients History and Physical, chart, labs and discussed the procedure including the risks, benefits and alternatives for the proposed anesthesia with the patient or authorized representative who has indicated his/her understanding and acceptance.   Dental advisory given  Plan Discussed with: CRNA and Surgeon  Anesthesia Plan Comments:         Anesthesia Quick Evaluation

## 2016-08-05 NOTE — Transfer of Care (Signed)
Immediate Anesthesia Transfer of Care Note  Patient: Erica [name]  Procedure(s) Performed: Procedure(s): DILATATION & CURETTAGE/HYSTEROSCOPY WITH MYOSURE (N/A) EXCISION OF SKIN TAG (Left)  Patient Location: PACU  Anesthesia Type:General  Level of Consciousness: awake, alert  and oriented  Airway & Oxygen Therapy: Patient Spontanous Breathing and Patient connected to nasal cannula oxygen  Post-op Assessment: Report given to RN and Post -op Vital signs reviewed and stable  Post vital signs: Reviewed and stable  Last Vitals:  Vitals:   08/05/16 0646 08/05/16 0651  BP:  (!) 141/87  Pulse: 96   Resp: 20   Temp: 37.1 C     Last Pain:  Vitals:   08/05/16 0646  TempSrc: Oral      Patients Stated Pain Goal: 3 ([id number])  Complications: No apparent anesthesia complications

## 2016-08-05 NOTE — Discharge Instructions (Signed)
°  Post Anesthesia Home Care Instructions ° °Activity: °Get plenty of rest for the remainder of the day. A responsible adult should stay with you for 24 hours following the procedure.  °For the next 24 hours, DO NOT: °-Drive a car °-Operate machinery °-Drink alcoholic beverages °-Take any medication unless instructed by your physician °-Make any legal decisions or sign important papers. ° °Meals: °Start with liquid foods such as gelatin or soup. Progress to regular foods as tolerated. Avoid greasy, spicy, heavy foods. If nausea and/or vomiting occur, drink only clear liquids until the nausea and/or vomiting subsides. Call your physician if vomiting continues. ° °Special Instructions/Symptoms: °Your throat may feel dry or sore from the anesthesia or the breathing tube placed in your throat during surgery. If this causes discomfort, gargle with warm salt water. The discomfort should disappear within 24 hours. ° °If you had a scopolamine patch placed behind your ear for the management of post- operative nausea and/or vomiting: ° °1. The medication in the patch is effective for 72 hours, after which it should be removed.  Wrap patch in a tissue and discard in the trash. Wash hands thoroughly with soap and water. °2. You may remove the patch earlier than 72 hours if you experience unpleasant side effects which may include dry mouth, dizziness or visual disturbances. °3. Avoid touching the patch. Wash your hands with soap and water after contact with the patch. °  ° ° °Postoperative Instructions Hysteroscopy D & C ° °Dr. Fontaine and the nursing staff have discussed postoperative instructions with you.  If you have any questions please ask them before you leave the hospital, or call Dr Fontaine’s office at [phone].   ° °We would like to emphasize the following instructions: ° ° °? Call the office to make your follow-up appointment as recommended by Dr Fontaine (usually 1-2 weeks). ° °? You were given a prescription,  or one was ordered for you at the pharmacy you designated.  Get that prescription filled and take the medication according to instructions. ° °? You may eat a regular diet, but slowly until you start having bowel movements. ° °? Drink plenty of water daily. ° °? Nothing in the vagina (intercourse, douching, objects of any kind) for two weeks.  When reinitiating intercourse, if it is uncomfortable, stop and make an appointment with Dr Fontaine to be evaluated. ° °? No driving for one to two days until the effects of anesthesia has worn off.  No traveling out of town for several days. ° °? You may shower, but no baths for one week.  Walking up and down stairs is ok.  No heavy lifting, prolonged standing, repeated bending or any “working out” until your post op check. ° °? Rest frequently, listen to your body and do not push yourself and overdo it. ° °? Call if: ° °o Your pain medication does not seem strong enough. °o Worsening pain or abdominal bloating °o Persistent nausea or vomiting °o Difficulty with urination or bowel movements. °o Temperature of 101 degrees or higher. °o Heavy vaginal bleeding.  If your period is due, you may use tampons. °o You have any questions or concerns ° ° ° ° °

## 2016-08-05 NOTE — Progress Notes (Signed)
Inpatient Diabetes Program Recommendations  AACE/ADA: New Consensus Statement on Inpatient Glycemic Control (2015)  Target Ranges:  Prepandial:   less than 140 mg/dL      Peak postprandial:   less than 180 mg/dL (1-2 hours)      Critically ill patients:  140 - 180 mg/dL   Results for MIRTA, COGSWELL (MRN [id number]) as of 08/05/2016 07:52  Ref. Range 08/05/2016 06:22  Glucose-Capillary Latest Ref Range: 65 - 99 mg/dL 273 (H)   Review of Glycemic Control  Diabetes history: DM2 Outpatient Diabetes medications: Metformin 500 mg BID (note on home medication list that patient has not taken in months) Current orders for Inpatient glycemic control: None; in Peri-Op/OR   Inpatient Diabetes Program Recommendations: Correction (SSI): If patient is admitted following surgery, while inpatient please consider using Glycemic Control order set to order CBGs with Novolog correction scale ACHS. Oral Agents: Noted Metformin 500 mg BID is listed on home medication list with note that patient has not taken in months. Patient's initial glucose was 273 mg/dl at 6:22 am today. Patient needs to be on DM medication as an outpatient. HgbA1C: Please consider ordering an A1C to evaluate glycemic control over the past 2-3 months. Outpatient Follow Up: Please have patient to follow up with PCP regarding glycemic control. Diet: If admitted and ordered diet, please consider ordering a Carb Modified diet.  Thanks, Barnie Alderman, RN, MSN, CDE Diabetes Coordinator Inpatient Diabetes Program [phone] (Team Pager from [place] to [place]) [phone] ([place] office) [phone] [facility] office) [phone] [facility] office)

## 2016-08-05 NOTE — H&P (Signed)
  The patient was examined.  I reviewed the proposed surgery and consent form with the patient.  The dictated history and physical is current and accurate and all questions were answered. The patient is ready to proceed with surgery and has a realistic understanding and expectation for the outcome.   Anastasio Auerbach MD, 7:04 AM 08/05/2016

## 2016-08-05 NOTE — OR Nursing (Signed)
Dr Sharlene Motts, Anesthesia, aware of blood sugar  273, pt does not take any meds for her diabetes, no treatment at this time. Will proceed with surgery, Dr Phineas Real aware. Kristine Royal, RN

## 2016-08-05 NOTE — Anesthesia Procedure Notes (Signed)
Procedure Name: LMA Insertion Date/Time: 08/05/2016 7:42 AM Performed by: Jonna Munro Pre-anesthesia Checklist: Patient identified, Emergency Drugs available, Suction available, Patient being monitored and Timeout performed Patient Re-evaluated:Patient Re-evaluated prior to inductionOxygen Delivery Method: Circle system utilized Preoxygenation: Pre-oxygenation with 100% oxygen Intubation Type: IV induction Ventilation: Mask ventilation without difficulty LMA: LMA with gastric port inserted LMA Size: 4.0 Number of attempts: 2 Placement Confirmation: positive ETCO2 and breath sounds checked- equal and bilateral Tube secured with: Tape Comments: 4 LMA inserted, unable to ventilate well, removed LMA mask ventilated easily. Placed 4 LMA supreme with adequate ventilation established, VSS.

## 2016-08-05 NOTE — Op Note (Signed)
Erica [name] 11-20-1946 [id number]   Post Operative Note   Date of surgery:  08/05/2016  Pre Op Dx:  Postmenopausal bleeding, endometrial polyp, skin tag  Post Op Dx:  Postmenopausal bleeding, endometrial polyp, skin tag  Procedure:  Hysteroscopy, D&C, Myosure resection of endometrial polyp, excision of skin tag  Surgeon:  Anastasio Auerbach  Anesthesia:  General  EBL:  Minimal  Distended media discrepancy:  [id number] cc saline  Complications:  None  Specimen:  #1 endometrial polyp #2 endometrial curetting #3 skin tag to pathology  Findings: EUA:  External BUS vagina with lipomatous skin tag left gluteal fold. Atrophic genital changes. Cervix atrophic. Uterus unable to palpate due to abdominal girth but no gross pelvic masses.   Hysteroscopy:  Adequate noting fundus, right/left tubal ostia, anterior posterior endometrial surfaces, lower uterine segment, endocervical canal all visualized. Large endometrial polyp from fundus extending down to the upper cervical canal. Resected in its entirety to the level the surrounding endometrium. Endometrium otherwise atrophic in appearance.  Procedure:  The patient was taken to the operating room, was placed in the low dorsal lithotomy position, underwent general anesthesia, received a perineal/vaginal preparation with Betadine solution per nursing personnel. The timeout was performed by the surgical team. An EUA was performed. The patient was draped in the usual fashion. The cervix was visualized with a speculum, anterior lip grasped with a single-tooth tenaculum and a paracervical block was placed using 10 cc's of 1% lidocaine. The cervix was gently dilated to admit the Myosure hysteroscope and hysteroscopy was performed with findings noted above. Using the Myosure Reach resectoscopic wand the polyp was resected in its entirety to the level the surrounding endometrium. A gentle sharp curettage was performed. Both specimens were sent separately to  pathology.  Repeat hysteroscopy showed an empty cavity with good distention and no evidence of perforation. The instruments were removed and adequate hemostasis was visualized at the tenaculum site and external cervical os. Attention was then turned to the large lipomatous skin tag. The skin tag was elevated, injected with 1% lidocaine at the base and sharply excised to the level the surrounding skin. The skin was then reapproximated using 3-0 Vicryl and interrupted cutaneous stitch. Adequate hemostasis was achieved and a sterile dressing was applied. The patient was given intraoperative Toradol, was awakened without difficulty and was taken to the recovery room in good condition having tolerated the procedure well.     Anastasio Auerbach MD, 8:10 AM 08/05/2016

## 2016-08-05 NOTE — Anesthesia Postprocedure Evaluation (Signed)
Anesthesia Post Note  Patient: KIMBERLIE TINNEY  Procedure(s) Performed: Procedure(s) (LRB): DILATATION & CURETTAGE/HYSTEROSCOPY WITH MYOSURE (N/A) EXCISION OF SKIN TAG (Left)  Patient location during evaluation: PACU Anesthesia Type: General Level of consciousness: awake and alert Pain management: pain level controlled Vital Signs Assessment: post-procedure vital signs reviewed and stable Respiratory status: spontaneous breathing, nonlabored ventilation, respiratory function stable and patient connected to nasal cannula oxygen Cardiovascular status: blood pressure returned to baseline and stable Postop Assessment: no signs of nausea or vomiting Anesthetic complications: no    Last Vitals:  Vitals:   08/05/16 0915 08/05/16 0930  BP: 111/71   Pulse: 75 84  Resp: 16 15  Temp:      Last Pain:  Vitals:   08/05/16 0820  TempSrc:   PainSc: 0-No pain                 Lenzy Kerschner S

## 2016-08-06 ENCOUNTER — Telehealth: Payer: Self-pay

## 2016-08-06 NOTE — Telephone Encounter (Signed)
Post op visit changed to 08/18/16 at 2:00pm. Patient informed. She will check with employer to see if forms or she needs a note. She will let me know.

## 2016-08-06 NOTE — Telephone Encounter (Signed)
Patient had surgery yesterday. She is asking about return to work date. She said she does not have disability insurance but does have plenty of earned time to take off.  She said she would like to take 2 weeks off to allow the place on her thigh to recover as she sits all day.  She said that would be 9/26, however she has her post op visit with you on 9/28 and wondered if she should plan on returning after that.  Either is fine with her.  Ok for 2 weeks out of work? 9/28?

## 2016-08-06 NOTE — Telephone Encounter (Signed)
2 weeks okay with return 9/26, bring her in before for postop visit

## 2016-08-07 ENCOUNTER — Encounter (HOSPITAL_COMMUNITY): Payer: Self-pay | Admitting: Gynecology

## 2016-08-08 ENCOUNTER — Telehealth: Payer: Self-pay

## 2016-08-08 NOTE — Telephone Encounter (Signed)
I put sutures through the skin. I did not put any tape so this probably is a residual from her bandage. She can remove all of the tape but leave the sutures alone.

## 2016-08-08 NOTE — Telephone Encounter (Signed)
Patient informed. 

## 2016-08-08 NOTE — Telephone Encounter (Signed)
Patient said there is a piece of tape on the area you excised on her thigh. ? Steri-strip.  She asked should she do anything to it?

## 2016-08-18 ENCOUNTER — Encounter: Payer: Self-pay | Admitting: Gynecology

## 2016-08-18 ENCOUNTER — Ambulatory Visit (INDEPENDENT_AMBULATORY_CARE_PROVIDER_SITE_OTHER): Payer: BC Managed Care – PPO | Admitting: Gynecology

## 2016-08-18 ENCOUNTER — Telehealth: Payer: Self-pay

## 2016-08-18 VITALS — BP 140/84

## 2016-08-18 DIAGNOSIS — Z9889 Other specified postprocedural states: Secondary | ICD-10-CM

## 2016-08-18 NOTE — Patient Instructions (Signed)
Follow up July 2018 for annual exam and follow up ultrasound

## 2016-08-18 NOTE — Progress Notes (Signed)
    Erica [name] 08/21/1946 [id number]        70 y.o.  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 Has done well but still having a lot of discomfort at the thigh incision site.  Past medical history,surgical history, problem list, medications, allergies, family history and social history were all reviewed and documented in the EPIC chart.  Directed ROS with pertinent positives and negatives documented in the history of present illness/assessment and plan.  Exam: Erica [name] assistant Vitals:   08/18/16 1411  BP: 140/84   General appearance:  Normal Abdomen soft without masses or tenderness Pelvic external BUS vagina atrophic. Cervix atrophic. Uterus unable to palpate but no gross masses or tenderness. Adnexa without gross masses or tenderness.  Left upper thigh incision site healing nicely. 5 Vicryl sutures still in place. All were removed with skin remaining intact.  Assessment/Plan:  70 y.o. G2P2 with normal postoperative visit. Added 1 week additional time for recovery at her request due to tenderness at her incision site. Incision itself is healing perfectly without evidence of infection irritation or erythema. Reviewed pictures from the surgery and pathology. The endometrial polyp did show focal simple hyperplasia without atypia. Surrounding endometrial curettage showed inactive endometrium. Discussed this with the patient in the probable removal in the polyp. Did recommend follow up ultrasound in one year to look at the endometrial echo possible biopsy. Patient knows the importance of follow up and will follow up then.    Erica Auerbach MD, 2:32 PM 08/18/2016

## 2016-08-18 NOTE — Telephone Encounter (Signed)
Letter mailed to patient for RTW 08/25/16 per Dr. Loetta Rough office note and patient's request.

## 2016-08-19 ENCOUNTER — Other Ambulatory Visit: Payer: Self-pay

## 2016-08-19 ENCOUNTER — Encounter: Payer: Self-pay | Admitting: Gynecology

## 2016-08-19 NOTE — Progress Notes (Unsigned)
Letter for employer mailed to patient today. Copy is in EPIC chart.

## 2016-08-21 ENCOUNTER — Ambulatory Visit: Payer: BC Managed Care – PPO | Admitting: Gynecology

## 2017-04-08 ENCOUNTER — Encounter: Payer: Self-pay | Admitting: Gynecology

## 2017-06-26 ENCOUNTER — Encounter: Payer: BC Managed Care – PPO | Admitting: Gynecology

## 2017-07-20 ENCOUNTER — Encounter: Payer: BC Managed Care – PPO | Admitting: Gynecology

## 2017-07-23 ENCOUNTER — Ambulatory Visit (INDEPENDENT_AMBULATORY_CARE_PROVIDER_SITE_OTHER): Payer: BC Managed Care – PPO | Admitting: Orthopedic Surgery

## 2017-07-23 ENCOUNTER — Encounter (INDEPENDENT_AMBULATORY_CARE_PROVIDER_SITE_OTHER): Payer: Self-pay | Admitting: Orthopedic Surgery

## 2017-07-23 DIAGNOSIS — B353 Tinea pedis: Secondary | ICD-10-CM | POA: Diagnosis not present

## 2017-07-23 DIAGNOSIS — B351 Tinea unguium: Secondary | ICD-10-CM | POA: Diagnosis not present

## 2017-07-23 NOTE — Progress Notes (Signed)
Office Visit Note   Patient: Erica [name]           Date of Birth: 09/03/1946           MRN: [id number] Visit Date: 07/23/2017              Requested by: No referring provider defined for this encounter. PCP: Shirline Frees, MD  Chief Complaint  Patient presents with  . Right Foot - Follow-up      HPI: Patient presents complaining of a painful ulcer in the fourth webspace between the fourth and fifth toe. Patient states that she has been using cotton in the webspace. She states this is been present for several months. 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 She states she has left-sided lumbar spine pain and discussed that she was recommended possible lumbar spine surgery by Dr. Ellene Route. Patient also complains of onychomycotic nails.  Assessment & Plan: Visit Diagnoses:  1. Fungal infection right foot   2. Onychomycosis     Plan: Recommended superglue on her toenails for 6 months 1 week at a time. Recommended the wall medical compression sock to wake away the moisture and try to resolve the interdigital ulcer. Discussed that if this does not work in 4 weeks to call and follow up will reevaluation.  Follow-Up Instructions: Return if symptoms worsen or fail to improve.   Ortho Exam  Patient is alert, oriented, no adenopathy, well-dressed, normal affect, normal respiratory effort. Examination patient does have left-sided radicular pain that radiates down the lateral aspect of her left leg she has no focal motor weakness. Examination of right foot she has a good dorsalis pedis pulse she has had a hard scab in the webspace between the fourth and fifth toe there is no drainage no odor no cellulitis. There are no corns or calluses no evidence of pressure points.  Imaging: No results found. No images are attached to the encounter.  Labs: No results found for: HGBA1C,  ESRSEDRATE, CRP, LABURIC, REPTSTATUS, GRAMSTAIN, CULT, LABORGA  Orders:  No orders of the defined types were placed in this encounter.  No orders of the defined types were placed in this encounter.    Procedures: No procedures performed  Clinical Data: No additional findings.  ROS:  All other systems negative, except as noted in the HPI. Review of Systems  Objective: Vital Signs: There were no vitals taken for this visit.  Specialty Comments:  No specialty comments available.  PMFS History: There are no active problems to display for this patient.  Past Medical History:  Diagnosis Date  . Bilateral swelling of feet   . Cataract   . Diabetes mellitus without complication (HCC)    diet controlled  . Eczema   . Elevated cholesterol   . GERD (gastroesophageal reflux disease)   . Glaucoma   . Hypertension   . Mitral valve prolapse   . 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 Spondylisthesis     Family History  Problem Relation Age of Onset  . Heart disease Mother   . Heart disease Father   . Breast cancer Sister 9    Past Surgical History:  Procedure Laterality Date  . CESAREAN SECTION    . DILATATION & CURETTAGE/HYSTEROSCOPY WITH MYOSURE N/A 08/05/2016   Procedure: DILATATION &  CURETTAGE/HYSTEROSCOPY WITH MYOSURE;  Surgeon: Anastasio Auerbach, MD;  Location: [facility];  Service: Gynecology;  Laterality: N/A;  . EXCISION OF SKIN TAG Left 08/05/2016   Procedure: EXCISION OF SKIN TAG;  Surgeon: Anastasio Auerbach, MD;  Location: [facility];  Service: Gynecology;  Laterality: Left;  . MOUTH SURGERY    . spinal tap     Social History   Occupational History  . Not on file.   Social History Main Topics  . Smoking status: Never Smoker  . Smokeless tobacco: Never Used  . Alcohol use No  . Drug use: No  . Sexual activity: Not Currently     Birth control/ protection: Post-menopausal     Comment: 1st intercourse 71 yo-Fewer than 5 partners

## 2017-08-05 ENCOUNTER — Encounter: Payer: Self-pay | Admitting: Gynecology

## 2017-08-05 ENCOUNTER — Ambulatory Visit (INDEPENDENT_AMBULATORY_CARE_PROVIDER_SITE_OTHER): Payer: BC Managed Care – PPO | Admitting: Gynecology

## 2017-08-05 VITALS — BP 126/80 | Ht 59.0 in | Wt 200.0 lb

## 2017-08-05 DIAGNOSIS — Z01411 Encounter for gynecological examination (general) (routine) with abnormal findings: Secondary | ICD-10-CM | POA: Diagnosis not present

## 2017-08-05 DIAGNOSIS — N952 Postmenopausal atrophic vaginitis: Secondary | ICD-10-CM

## 2017-08-05 DIAGNOSIS — N8500 Endometrial hyperplasia, unspecified: Secondary | ICD-10-CM | POA: Diagnosis not present

## 2017-08-05 NOTE — Progress Notes (Signed)
    Erica [name] 11-Mar-1946 [id number]        71 y.o.  G2P2 for annual gynecologic exam.  History of postmenopausal bleeding last year. Ultimately underwent hysteroscopy D&C where pathology showed a hyperplastic polyp without atypia. Surrounding endometrium was atrophic. Has done well since with no bleeding.  Past medical history,surgical history, problem list, medications, allergies, family history and social history were all reviewed and documented as reviewed in the EPIC chart.  ROS:  Performed with pertinent positives and negatives included in the history, assessment and plan.   Additional significant findings :  None   Exam: Wandra Scot assistant Vitals:   08/05/17 1014  BP: 126/80  Weight: 200 lb (90.7 kg)  Height: 4\' 11"  (1.499 m)   Body mass index is 40.4 kg/m.  General appearance:  Normal affect, orientation and appearance. Skin: Grossly normal HEENT: Without gross lesions.  No cervical or supraclavicular adenopathy. Thyroid normal.  Lungs:  Clear without wheezing, rales or rhonchi Cardiac: RR, without RMG Abdominal:  Soft, nontender, without masses, guarding, rebound, organomegaly or hernia Breasts:  Examined lying and sitting without masses, retractions, discharge or axillary adenopathy. Pelvic:  Ext, BUS, Vagina: With atrophic changes  Cervix: With atrophic changes  Uterus: Difficult to palpate but no gross masses or tenderness  Adnexa: Without gross masses or tenderness    Anus and perineum: Normal   Rectovaginal: Normal sphincter tone without palpated masses or tenderness.    Assessment/Plan:  71 y.o. G2P2 female for annual gynecologic exam.   1. History of hyperplastic polyp without atypia and surrounding atrophic endometrium. Options for management to include expectant and report any bleeding, ultrasound for endometrial echo assessment, endometrial biopsy all reviewed. The pros and cons of each approach discussed. Ultimately we decided on ultrasound for  endometrial echo assessment and she'll schedule this in follow up for this. 2. Mammography coming due in December and I reminded her to schedule this. Rest exam normal today. 3. Pap smear 2017. No Pap smear done today. No history of abnormal Pap smears previously. 4. Colonoscopy never. I again strongly recommended patient schedule a screening colonoscopy. Second most common cancer in women. Benefits of precancerous polyp removal and early detection reviewed. Patient acknowledges my recommendation but does not appear that she is going to schedule. Names and numbers provided 5. DEXA never. I strongly recommended patient schedule a baseline bone density. Issues of osteoporosis and fracture discussed with available treatment available. Patient acknowledges my recommendation but at this time does not appear that she wants to schedule. 6. Health maintenance. No routine lab work ordered as she does this elsewhere. Follow up for the ultrasound. Follow up in one year for annual exam.   Anastasio Auerbach MD, 10:36 AM 08/05/2017

## 2017-08-05 NOTE — Patient Instructions (Addendum)
Follow up for the ultrasound as scheduled.  Schedule your bone density  Schedule your colonoscopy with either:  [facility]   Address: [address], [place], [place] [address]  [phone]    or  [facility]  Address: [address], [place], [place] [address]  [phone]

## 2017-08-17 ENCOUNTER — Other Ambulatory Visit: Payer: Self-pay | Admitting: Gynecology

## 2017-08-17 ENCOUNTER — Ambulatory Visit (INDEPENDENT_AMBULATORY_CARE_PROVIDER_SITE_OTHER): Payer: BC Managed Care – PPO | Admitting: Gynecology

## 2017-08-17 ENCOUNTER — Encounter: Payer: Self-pay | Admitting: Gynecology

## 2017-08-17 ENCOUNTER — Ambulatory Visit (INDEPENDENT_AMBULATORY_CARE_PROVIDER_SITE_OTHER): Payer: BC Managed Care – PPO

## 2017-08-17 VITALS — BP 130/84

## 2017-08-17 DIAGNOSIS — R102 Pelvic and perineal pain: Secondary | ICD-10-CM

## 2017-08-17 DIAGNOSIS — N840 Polyp of corpus uteri: Secondary | ICD-10-CM | POA: Diagnosis not present

## 2017-08-17 DIAGNOSIS — N8500 Endometrial hyperplasia, unspecified: Secondary | ICD-10-CM

## 2017-08-17 NOTE — Progress Notes (Signed)
    Erica [name] [birth year] [id number]        71 y.o.  G2P2 presents for ultrasound. History of hysteroscopy D&C last year which showed a hyperplastic polyp without atypia and the surrounding endometrium on curettage showed atrophic endometrium. I ordered the ultrasound to be sure that she was not redeveloping hyperplastic changes/polyp.  Past medical history,surgical history, problem list, medications, allergies, family history and social history were all reviewed and documented in the EPIC chart.  Directed ROS with pertinent positives and negatives documented in the history of present illness/assessment and plan.  Exam: Vitals:   08/17/17 1138  BP: 130/84   General appearance:  Normal  Ultrasound transvaginal and transabdominal shows uterus normal size and echotexture. Endometrial echo 4.6 mm. Right ovary atrophic. Right adnexa negative. Left ovary not visualized. Left adnexa negative. Cul-de-sac negative.  Assessment/Plan:  71 y.o. G2P2 with history of hyperplastic polyp without atypia resected last year. Surrounding endometrium was atrophic. Ultrasound shows endometrial echo 4.6 mm. Prior ultrasound endometrial echo was 12 mm before her polypectomy. I reviewed with the patient she has a thin endometrium. Unlikely at 4.6 mm she has any significant pathology. I did discuss the 4 mm ACOG guidelines versus 5 mm. Patient's comfortable with no further evaluation as am I at this point. Patient knows to call or follow up if any vaginal bleeding as she had with her prior polyp. Follow up in one year for annual exam    Anastasio Auerbach MD, 11:49 AM 08/17/2017

## 2017-08-17 NOTE — Patient Instructions (Signed)
Follow up in one year for annual exam 

## 2017-09-01 ENCOUNTER — Other Ambulatory Visit: Payer: Self-pay | Admitting: Family Medicine

## 2017-09-01 ENCOUNTER — Ambulatory Visit
Admission: RE | Admit: 2017-09-01 | Discharge: 2017-09-01 | Disposition: A | Discharge status: Home / Self Care | Payer: BC Managed Care – PPO | Source: Ambulatory Visit | Attending: Family Medicine | Admitting: Family Medicine

## 2017-09-01 DIAGNOSIS — M5432 Sciatica, left side: Secondary | ICD-10-CM

## 2017-09-01 DIAGNOSIS — R0789 Other chest pain: Secondary | ICD-10-CM

## 2017-11-09 ENCOUNTER — Ambulatory Visit (INDEPENDENT_AMBULATORY_CARE_PROVIDER_SITE_OTHER): Payer: BC Managed Care – PPO | Admitting: Orthopedic Surgery

## 2017-11-09 ENCOUNTER — Encounter (INDEPENDENT_AMBULATORY_CARE_PROVIDER_SITE_OTHER): Payer: Self-pay | Admitting: Orthopedic Surgery

## 2017-11-09 VITALS — Ht 59.0 in | Wt 200.0 lb

## 2017-11-09 DIAGNOSIS — L84 Corns and callosities: Secondary | ICD-10-CM | POA: Diagnosis not present

## 2017-11-09 DIAGNOSIS — B353 Tinea pedis: Secondary | ICD-10-CM | POA: Diagnosis not present

## 2017-11-09 DIAGNOSIS — B351 Tinea unguium: Secondary | ICD-10-CM

## 2017-11-09 NOTE — Progress Notes (Signed)
Office Visit Note   Patient: Erica [name]           Date of Birth: 04/07/1946           MRN: [id number] Visit Date: 11/09/2017              Requested by: Shirline Frees, MD [address], [place] [id number] PCP: Shirline Frees, MD  Chief Complaint  Patient presents with  . Right Foot - Follow-up    Right foot ulcer 4th webspace       HPI: Patient is a 71 year old woman who presents with a painful corn fourth webspace PIP joint of the fourth and fifth toe.  Assessment & Plan: Visit Diagnoses:  1. Fungal infection right foot   2. Onychomycosis   3. Corn of foot     Plan: Recommended wearing the pad to unload pressure in the fourth webspace recommended a new balance extra wide flat shoes she does not need arch support due to her chronic posterior tibial tendon insufficiency.  Follow-Up Instructions: Return if symptoms worsen or fail to improve.   Ortho Exam  Patient is alert, oriented, no adenopathy, well-dressed, normal affect, normal respiratory effort. Examination patient has an antalgic gait she has good pulses her foot is plantigrade with pes planus secondary to chronic posterior tibial tendon insufficiency.  She has no active fungal infection at this time.  She does have a painful corn in the fourth webspace over the PIP joint of the fourth and fifth toes.  After informed consent a 10 blade knife was used to pare the callus patient tolerated this well there is no bleeding no open ulcer.  A webspace pad was placed and she was given additional samples.  Imaging: No results found. No images are attached to the encounter.  Labs: No results found for: HGBA1C, ESRSEDRATE, CRP, LABURIC, REPTSTATUS, GRAMSTAIN, CULT, LABORGA  @LABSALLVALUES (HGBA1)@  Body mass index is 40.4 kg/m.  Orders:  No orders of the defined types were placed in this encounter.  No orders of the defined types were placed in this encounter.    Procedures: No  procedures performed  Clinical Data: No additional findings.  ROS:  All other systems negative, except as noted in the HPI. Review of Systems  Objective: Vital Signs: Ht 4\' 11"  (1.499 m)   Wt 200 lb (90.7 kg)   BMI 40.40 kg/m   Specialty Comments:  No specialty comments available.  PMFS History: There are no active problems to display for this patient.  Past Medical History:  Diagnosis Date  . Bilateral swelling of feet   . Cataract   . Diabetes mellitus without complication (HCC)    diet controlled  . Eczema   . Elevated cholesterol   . GERD (gastroesophageal reflux disease)   . Glaucoma   . Hypertension   . Mitral valve prolapse   . 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 Spondylisthesis     Family History  Problem Relation Age of Onset  . Heart disease Mother   . Heart disease Father   . Breast cancer Sister 74    Past Surgical History:  Procedure Laterality Date  . CESAREAN SECTION    . DILATATION & CURETTAGE/HYSTEROSCOPY WITH MYOSURE N/A 08/05/2016   Procedure: DILATATION & CURETTAGE/HYSTEROSCOPY WITH MYOSURE;  Surgeon: Anastasio Auerbach, MD;  Location: [facility];  Service: Gynecology;  Laterality:  N/A;  . EXCISION OF SKIN TAG Left 08/05/2016   Procedure: EXCISION OF SKIN TAG;  Surgeon: Anastasio Auerbach, MD;  Location: [facility];  Service: Gynecology;  Laterality: Left;  . MOUTH SURGERY    . spinal tap     Social History   Occupational History  . Not on file  Tobacco Use  . Smoking status: Never Smoker  . Smokeless tobacco: Never Used  Substance and Sexual Activity  . Alcohol use: No    Alcohol/week: 0.0 oz  . Drug use: No  . Sexual activity: Not Currently    Birth control/protection: Post-menopausal    Comment: 1st intercourse 71 yo-Fewer than 5 partners

## 2018-08-18 ENCOUNTER — Other Ambulatory Visit: Payer: Self-pay | Admitting: Family Medicine

## 2018-08-18 ENCOUNTER — Ambulatory Visit
Admission: RE | Admit: 2018-08-18 | Discharge: 2018-08-18 | Disposition: A | Discharge status: Home / Self Care | Payer: BC Managed Care – PPO | Source: Ambulatory Visit | Attending: Family Medicine | Admitting: Family Medicine

## 2018-08-18 DIAGNOSIS — M47812 Spondylosis without myelopathy or radiculopathy, cervical region: Secondary | ICD-10-CM

## 2018-09-16 ENCOUNTER — Encounter: Payer: BC Managed Care – PPO | Admitting: Gynecology

## 2018-11-11 ENCOUNTER — Ambulatory Visit: Payer: BC Managed Care – PPO | Admitting: Gynecology

## 2018-11-11 ENCOUNTER — Encounter: Payer: Self-pay | Admitting: Gynecology

## 2018-11-11 VITALS — BP 124/82 | Ht 59.0 in | Wt 207.0 lb

## 2018-11-11 DIAGNOSIS — Z124 Encounter for screening for malignant neoplasm of cervix: Secondary | ICD-10-CM

## 2018-11-11 DIAGNOSIS — N952 Postmenopausal atrophic vaginitis: Secondary | ICD-10-CM

## 2018-11-11 DIAGNOSIS — Z01419 Encounter for gynecological examination (general) (routine) without abnormal findings: Secondary | ICD-10-CM

## 2018-11-11 NOTE — Progress Notes (Signed)
    Erica [name] 03-12-1946 [id number]        72 y.o.  G2P2 for annual gynecologic exam.  Without gynecologic complaints.  Past medical history,surgical history, problem list, medications, allergies, family history and social history were all reviewed and documented as reviewed in the EPIC chart.  ROS:  Performed with pertinent positives and negatives included in the history, assessment and plan.   Additional significant findings : None   Exam: Erica [name] assistant Vitals:   11/11/18 1119  BP: 124/82  Weight: 207 lb (93.9 kg)  Height: 4\' 11"  (1.499 m)   Body mass index is 41.81 kg/m.  General appearance:  Normal affect, orientation and appearance. Skin: Grossly normal HEENT: Without gross lesions.  No cervical or supraclavicular adenopathy. Thyroid normal.  Lungs:  Clear without wheezing, rales or rhonchi Cardiac: RR, without RMG Abdominal:  Soft, nontender, without masses, guarding, rebound, organomegaly or hernia Breasts:  Examined lying and sitting without masses, retractions, discharge or axillary adenopathy. Pelvic:  Ext, BUS, Vagina: With atrophic changes  Cervix: With atrophic changes  Uterus: Able to palpate but no gross masses or tenderness  Adnexa: Without gross masses or tenderness    Anus and perineum: Normal   Rectovaginal: Normal sphincter tone without palpated masses or tenderness.    Assessment/Plan:  72 y.o. G2P2 female for annual gynecologic exam.   1. Postmenopausal.  No significant menopausal symptoms or any vaginal bleeding.  Report any vaginal bleeding. 2. Mammography due now and patient is going to schedule.  Breast exam normal today. 3. Pap smear 2017.  Pap smear done today.  No history of abnormal Pap smears previously.  Options to stop screening per current screening guidelines based on age reviewed.  Will readdress on an annual basis. 4. Colonoscopy never.  I again strongly recommended patient schedule a screening colonoscopy.  Second most  common cancer in women stressed.  The patient refuses. 5. DEXA never.  I again strongly recommended she schedule a baseline bone density.  The patient refuses. 6. Health maintenance.  No routine lab work done as patient does this elsewhere.  Follow-up 1 year, sooner as needed.   Anastasio Auerbach MD, 12:13 PM 11/11/2018

## 2018-11-11 NOTE — Patient Instructions (Signed)
Please consider scheduling a screening colonoscopy and a bone density.  Follow-up in 1 year for annual exam

## 2018-11-15 LAB — PAP IG W/ RFLX HPV ASCU

## 2019-08-31 ENCOUNTER — Encounter: Payer: Self-pay | Admitting: Gynecology

## 2019-11-15 ENCOUNTER — Other Ambulatory Visit: Payer: Self-pay

## 2019-11-16 ENCOUNTER — Ambulatory Visit: Payer: BC Managed Care – PPO | Admitting: Gynecology

## 2019-11-16 ENCOUNTER — Encounter: Payer: Self-pay | Admitting: Gynecology

## 2019-11-16 VITALS — BP 130/76 | Ht 59.0 in | Wt 221.0 lb

## 2019-11-16 DIAGNOSIS — N952 Postmenopausal atrophic vaginitis: Secondary | ICD-10-CM | POA: Diagnosis not present

## 2019-11-16 DIAGNOSIS — Z01419 Encounter for gynecological examination (general) (routine) without abnormal findings: Secondary | ICD-10-CM

## 2019-11-16 NOTE — Patient Instructions (Signed)
Schedule colonoscopy  Schedule bone density  Follow-up in 1 year for annual exam

## 2019-11-16 NOTE — Progress Notes (Signed)
    Erica [name] [birth year] [id number]        73 y.o.  G2P2 for annual gynecologic exam.  Without gynecologic complaints  Past medical history,surgical history, problem list, medications, allergies, family history and social history were all reviewed and documented as reviewed in the EPIC chart.  ROS:  Performed with pertinent positives and negatives included in the history, assessment and plan.   Additional significant findings : None   Exam: [name] assistant Vitals:   11/16/19 1055  BP: 130/76  Weight: 221 lb (100.2 kg)  Height: 4\' 11"  (1.499 m)   Body mass index is 44.64 kg/m.  General appearance:  Normal affect, orientation and appearance. Skin: Grossly normal HEENT: Without gross lesions.  No cervical or supraclavicular adenopathy. Thyroid normal.  Lungs:  Clear without wheezing, rales or rhonchi Cardiac: RR, without RMG Abdominal:  Soft, nontender, without masses, guarding, rebound, organomegaly or hernia Breasts:  Examined lying and sitting without masses, retractions, discharge or axillary adenopathy. Pelvic:  Ext, BUS, Vagina: With atrophic changes  Cervix: With atrophic changes  Uterus: Unable to palpate.  No gross masses or tenderness  Adnexa: Without masses or tenderness    Anus and perineum: Normal   Rectovaginal: Normal sphincter tone without palpated masses or tenderness.    Assessment/Plan:  73 y.o. G2P2 female for annual gynecologic exam.   1. Postmenopausal.No significant menopausal symptoms or any vaginal bleeding. 2. Pap smear 2019.  No Pap smear done today.  No history of abnormal Pap smears. 3. DEXA never.  Strongly recommended patient schedule baseline DEXA.  Patient refuses at this time. 4. Colonoscopy never.  Strongly recommended patient schedule colonoscopy.  Patient refuses at this time. 5. Mammography scheduled end of December breast exam normal today. 6. Health maintenance.  No routine lab work done as patient does this elsewhere.   Follow-up in 1 year, sooner as needed.   Anastasio Auerbach MD, 11:20 AM 11/16/2019

## 2020-04-14 ENCOUNTER — Ambulatory Visit: Payer: BC Managed Care – PPO | Attending: Internal Medicine

## 2020-04-14 DIAGNOSIS — Z23 Encounter for immunization: Secondary | ICD-10-CM

## 2020-04-14 NOTE — Progress Notes (Signed)
   Covid-19 Vaccination Clinic  Name:  Erica [name]    MRN: [id number] DOB: [birth year]  04/14/2020  Ms. Read was observed post Covid-19 immunization for 15 minutes without incident. She was provided with Vaccine Information Sheet and instruction to access the V-Safe system.   [name] was instructed to call 911 with any severe reactions post vaccine: Marland Kitchen Difficulty breathing  . Swelling of face and throat  . A fast heartbeat  . A bad rash all over body  . Dizziness and weakness   Immunizations Administered    Name Date Dose VIS Date Route   Pfizer COVID-19 Vaccine 04/14/2020 11:56 AM 0.3 mL 01/18/2019 Intramuscular   Manufacturer: [employer], [employer]   Lot: [id number]   [place]: [id number]

## 2020-05-07 ENCOUNTER — Ambulatory Visit: Payer: BC Managed Care – PPO

## 2020-05-10 ENCOUNTER — Ambulatory Visit: Payer: BC Managed Care – PPO | Attending: Internal Medicine

## 2020-05-10 DIAGNOSIS — Z23 Encounter for immunization: Secondary | ICD-10-CM

## 2020-05-10 NOTE — Progress Notes (Signed)
   Covid-19 Vaccination Clinic  Name:  Erica [name]    MRN: [id number] DOB: [birth year]  05/10/2020  [name] was observed post Covid-19 immunization for 15 minutes without incident. She was provided with Vaccine Information Sheet and instruction to access the V-Safe system.   [name] was instructed to call 911 with any severe reactions post vaccine: Marland Kitchen Difficulty breathing  . Swelling of face and throat  . A fast heartbeat  . A bad rash all over body  . Dizziness and weakness   Immunizations Administered    Name Date Dose VIS Date Route   Pfizer COVID-19 Vaccine 05/10/2020  4:43 PM 0.3 mL 01/18/2019 Intramuscular   Manufacturer: [place]   Lot: [id number]   [place]: [id number]

## 2020-10-23 ENCOUNTER — Telehealth: Payer: Self-pay | Admitting: Hematology

## 2020-10-23 NOTE — Telephone Encounter (Signed)
Received a new pt referral from Dr. Kenton Kingfisher for leukocytosis. Ms. Handyside has been cld and scheduled to see Dr. Irene Limbo on 12/1 at 840am. Pt aware to arrive at 8:15am to be checked in on time.

## 2020-10-23 NOTE — Progress Notes (Signed)
HEMATOLOGY/ONCOLOGY CONSULTATION NOTE  Date of Service: 10/24/2020  Patient Care Team: Shirline Frees, MD as PCP - General (Family Medicine)  CHIEF COMPLAINTS/PURPOSE OF CONSULTATION:  Leukocytosis  HISTORY OF PRESENTING ILLNESS:   Erica [name] is a wonderful 74 y.o. female who has been referred to [place] by Dr. Kenton Kingfisher for evaluation and management of leukocytosis and thrombocytosis. The pt reports that she is doing well overall.   The pt reports that her labs in [name] of 2021 were normal. Her most recent labs was the first time that she was told of her leukocytosis and thrombocytosis. Pt notes that she has been more fatigued over the last month. Pt hit her leg in August and has had consistent leg swelling since then. She is currently on Lasix for leg swelling. Pt does not feel that her Diabetes has been stable as she has been eating too many sugary foods and was told to work on weight loss. Her Optometrist saw something of concern during her eye exam, which prompted him to order the 10/12/20 labs. Pt does not think that she was diagnosed with Diabetic Retinopathy. She has Glaucoma and avoids steroid use for that reason. Pt notes that Penicilin and Sulfa drugs cause rash.  Most recent lab results (10/12/2020) of CBC is as follows: all values are WNL except for WBC at 42.1K, RDW at 15.7, PLT at 875K, Neutro Abs at 25.3K, Lymphs Abs at 6.3K, Mono Abs at 1.3K, Baso Abs at 2.1K, nRBC at 7, Glucose at 274.  On review of systems, pt reports fatigue, leg swelling and denies bone pain, abnormal/excessive bleeding, low appetite, unexpected weight loss, back pain, abdominal pain and any other symptoms.   On PMHx the pt reports Diabetes, D&C, Cataract Surgery, Glaucoma, HTN, HLD, Eczema. On Social Hx the pt reports that she is a non-smoker and does not drink much alcohol. Pt currently works in the [employer] office at [employer]. On Family Hx the pt reports that her sister had Breast Cancer.  MEDICAL  HISTORY:  Past Medical History:  Diagnosis Date  . Bilateral swelling of feet   . Cataract   . Diabetes mellitus without complication (HCC)    diet controlled  . Eczema   . Elevated cholesterol   . GERD (gastroesophageal reflux disease)   . Glaucoma   . Hypertension   . Mitral valve prolapse   . 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 Spondylisthesis     SURGICAL HISTORY: Past Surgical History:  Procedure Laterality Date  . CESAREAN SECTION    . DILATATION & CURETTAGE/HYSTEROSCOPY WITH MYOSURE N/A 08/05/2016   Procedure: DILATATION & CURETTAGE/HYSTEROSCOPY WITH MYOSURE;  Surgeon: Anastasio Auerbach, MD;  Location: [facility];  Service: Gynecology;  Laterality: N/A;  . EXCISION OF SKIN TAG Left 08/05/2016   Procedure: EXCISION OF SKIN TAG;  Surgeon: Anastasio Auerbach, MD;  Location: [facility];  Service: Gynecology;  Laterality: Left;  . MOUTH SURGERY    . spinal tap      SOCIAL HISTORY: Social History   Socioeconomic History  . Marital status: Divorced    Spouse name: Not on file  . Number of children: Not on file  . Years of education: Not on file  . Highest education level: Not on file  Occupational History  . Not on file  Tobacco Use  . Smoking status: Never Smoker  . Smokeless tobacco: Never  Used  Vaping Use  . Vaping Use: Never used  Substance and Sexual Activity  . Alcohol use: No    Alcohol/week: 0.0 standard drinks  . Drug use: No  . Sexual activity: Not Currently    Birth control/protection: Post-menopausal    Comment: 1st intercourse 74 yo-Fewer than 5 partners  Other Topics Concern  . Not on file  Social History Narrative  . Not on file   Social Determinants of Health   Financial Resource Strain:   . Difficulty of Paying Living Expenses: Not on file  Food Insecurity:   . Worried About Charity fundraiser in the Last  Year: Not on file  . Ran Out of Food in the Last Year: Not on file  Transportation Needs:   . Lack of Transportation (Medical): Not on file  . Lack of Transportation (Non-Medical): Not on file  Physical Activity:   . Days of Exercise per Week: Not on file  . Minutes of Exercise per Session: Not on file  Stress:   . Feeling of Stress : Not on file  Social Connections:   . Frequency of Communication with Friends and Family: Not on file  . Frequency of Social Gatherings with Friends and Family: Not on file  . Attends Religious Services: Not on file  . Active Member of Clubs or Organizations: Not on file  . Attends Archivist Meetings: Not on file  . Marital Status: Not on file  Intimate Partner Violence:   . Fear of Current or Ex-Partner: Not on file  . Emotionally Abused: Not on file  . Physically Abused: Not on file  . Sexually Abused: Not on file    FAMILY HISTORY: Family History  Problem Relation Age of Onset  . Heart disease Mother   . Heart disease Father   . Breast cancer Sister 63    ALLERGIES:  is allergic to penicillins and sulfa antibiotics.  MEDICATIONS:  Current Outpatient Medications  Medication Sig Dispense Refill  . doxycycline (VIBRA-TABS) 100 MG tablet Take 100 mg by mouth 2 (two) times daily.    . furosemide (LASIX) 20 MG tablet Take 20 mg by mouth daily.    Marland Kitchen gabapentin (NEURONTIN) 100 MG capsule Take 100 mg by mouth 3 (three) times daily.    Marland Kitchen latanoprost (XALATAN) 0.005 % ophthalmic solution Place 1 drop into both eyes at bedtime.     Marland Kitchen loratadine (CLARITIN) 10 MG tablet Take 10 mg by mouth daily as needed for allergies.    . metFORMIN (GLUCOPHAGE) 500 MG tablet Take by mouth 2 (two) times daily with a meal.    . naproxen sodium (ALEVE) 220 MG tablet Take 220 mg by mouth daily as needed (for foot pain).    . propranolol (INDERAL) 10 MG tablet Take 10 mg by mouth 3 (three) times daily.    . traMADol (ULTRAM) 50 MG tablet Take 50 mg by mouth  every 6 (six) hours as needed for moderate pain.     Marland Kitchen triamcinolone cream (KENALOG) 0.1 % Apply 1 application topically 2 (two) times daily. Reported on 06/06/2016    . triamterene-hydrochlorothiazide (DYAZIDE) 37.5-25 MG capsule Take 1 capsule by mouth daily.     No current facility-administered medications for this visit.    REVIEW OF SYSTEMS:    10 Point review of Systems was done is negative except as noted above.  PHYSICAL EXAMINATION: ECOG PERFORMANCE STATUS: 1 - Symptomatic but completely ambulatory  . Vitals:   10/24/20 0909  BP:  140/72  Pulse: 78  Resp: 18  Temp: 99.1 F (37.3 C)  SpO2: 99%   Filed Weights   10/24/20 0909  Weight: 208 lb 1.6 oz (94.4 kg)   .Body mass index is 42.03 kg/m.  GENERAL:alert, in no acute distress and comfortable SKIN: no acute rashes, no significant lesions EYES: conjunctiva are pink and non-injected, sclera anicteric OROPHARYNX: MMM, no exudates, no oropharyngeal erythema or ulceration NECK: supple, no JVD LYMPH:  no palpable lymphadenopathy in the cervical, axillary or inguinal regions LUNGS: clear to auscultation b/l with normal respiratory effort HEART: regular rate & rhythm ABDOMEN:  normoactive bowel sounds , non tender, not distended. Barely palpable spleen. Extremity: no pedal edema PSYCH: alert & oriented x 3 with fluent speech NEURO: no focal motor/sensory deficits  LABORATORY DATA:  I have reviewed the data as listed  . CBC Latest Ref Rng & Units 10/24/2020 07/31/2016  WBC 4.0 - 10.5 K/uL 45.5(H) 7.8  Hemoglobin 12.0 - 15.0 g/dL 11.6(L) 15.0  Hematocrit 36 - 46 % 36.0 43.8  Platelets 150 - 400 K/uL 841(H) 183    . CMP Latest Ref Rng & Units 10/24/2020 07/31/2016  Glucose 70 - 99 mg/dL 300(H) 285(H)  BUN 8 - 23 mg/dL 13 13  Creatinine 0.44 - 1.00 mg/dL 0.93 0.75  Sodium 135 - 145 mmol/L 136 135  Potassium 3.5 - 5.1 mmol/L 4.5 3.7  Chloride 98 - 111 mmol/L 101 99(L)  CO2 22 - 32 mmol/L 25 27  Calcium 8.9 - 10.3  mg/dL 9.6 9.7  Total Protein 6.5 - 8.1 g/dL 6.8 7.5  Total Bilirubin 0.3 - 1.2 mg/dL 0.5 0.5  Alkaline Phos 38 - 126 U/L 97 63  AST 15 - 41 U/L 42(H) 26  ALT 0 - 44 U/L 33 30     RADIOGRAPHIC STUDIES: I have personally reviewed the radiological images as listed and agreed with the findings in the report. No results found.  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 PLAN: -Discussed patient's most recent labs from 10/12/2020, severe neutrophilia and thrombocytosis, Glucose is elevated. -Advised pt that the pattern of WBC elevation could be due to a primary bone marrow disorder (most likely CML) or inflammation from a reactive process. -Advised pt that a complete w/o includes blood work, Abdominal US, and a BM Bx. Pt is adamant that she will not have a BM Bx under any circumstances. She is also not interested in getting an abdominal US. -Advised pt that it is unlikely that we will get a definitive diagnosis without the completion of the w/o - pt is okay with this.  -Discussed CDC guidelines regarding the [place] booster. Recommend she receive the booster at this time as there is a concern for a primary bone marrow problem.  -Will get labs today - including molecular studies -Will see back in 10-12 days    FOLLOW UP: Labs today RTC with Dr Irene Limbo in 10-12 days   All of the patients questions were answered with apparent satisfaction. The patient knows to call the clinic with any problems, questions or concerns.  I spent 45 mins counseling the patient face to face. The total time spent in the appointment was 60  minutes and more than 50% was on counseling and direct patient cares.    Sullivan Lone MD [place] AAHIVMS [facility] Hematology/Oncology Physician [place]  [place]  (Office):       [phone] (Work cell):   [phone] (Fax):           [phone]  10/24/2020 4:50 PM  I, Yevette Edwards, am acting as a scribe for Dr. Sullivan Lone.   .I have reviewed the above documentation for accuracy and completeness, and I agree with the above. Brunetta Genera MD   ADDENDUM  After flow cytometry results available sowing 19% I called the patient by phone and discussed concerning for MPN with accelerated phase vs blast crisis vs overt Acute leukemia and need for BM Bx for further evaluation and treatment determination. Patient noted that she refuses a BM Bx and invasive evaluation under all circumstances and notes "do not try to convince me about BM Bx I simply will not do it". Patient understands concerns for acute leukemia and notes " she is not keen on any aggressive interventions and wants to just wait on remaining labs and then will decide how to proceed". She was recommended to go to ED for any acute new symptoms pending lab results  .Brunetta Genera MD

## 2020-10-24 ENCOUNTER — Inpatient Hospital Stay: Payer: BC Managed Care – PPO | Attending: Hematology | Admitting: Hematology

## 2020-10-24 ENCOUNTER — Inpatient Hospital Stay: Payer: BC Managed Care – PPO

## 2020-10-24 ENCOUNTER — Other Ambulatory Visit: Payer: Self-pay

## 2020-10-24 VITALS — BP 140/72 | HR 78 | Temp 99.1°F | Resp 18 | Ht 59.0 in | Wt 208.1 lb

## 2020-10-24 DIAGNOSIS — D75839 Thrombocytosis, unspecified: Secondary | ICD-10-CM | POA: Insufficient documentation

## 2020-10-24 DIAGNOSIS — D471 Chronic myeloproliferative disease: Secondary | ICD-10-CM

## 2020-10-24 DIAGNOSIS — D72829 Elevated white blood cell count, unspecified: Secondary | ICD-10-CM | POA: Diagnosis not present

## 2020-10-24 LAB — CMP (CANCER CENTER ONLY)
ALT: 33 U/L (ref 0–44)
AST: 42 U/L — ABNORMAL HIGH (ref 15–41)
Albumin: 3.9 g/dL (ref 3.5–5.0)
Alkaline Phosphatase: 97 U/L (ref 38–126)
Anion gap: 10 (ref 5–15)
BUN: 13 mg/dL (ref 8–23)
CO2: 25 mmol/L (ref 22–32)
Calcium: 9.6 mg/dL (ref 8.9–10.3)
Chloride: 101 mmol/L (ref 98–111)
Creatinine: 0.93 mg/dL (ref 0.44–1.00)
GFR, Estimated: >60 mL/min (ref >60)
Glucose, Bld: 300 mg/dL — ABNORMAL HIGH (ref 70–99)
Potassium: 4.5 mmol/L (ref 3.5–5.1)
Sodium: 136 mmol/L (ref 135–145)
Total Bilirubin: 0.5 mg/dL (ref 0.3–1.2)
Total Protein: 6.8 g/dL (ref 6.5–8.1)

## 2020-10-24 LAB — SAMPLE TO BLOOD BANK

## 2020-10-24 LAB — CBC WITH DIFFERENTIAL/PLATELET
Abs Immature Granulocytes: 2.3 10*3/uL — ABNORMAL HIGH (ref 0.00–0.07)
Band Neutrophils: 7 %
Eosinophils Absolute: 0 10*3/uL (ref 0.0–0.5)
Lymphs Abs: 7.3 10*3/uL — ABNORMAL HIGH (ref 0.7–4.0)
Monocytes Absolute: 1.8 10*3/uL — ABNORMAL HIGH (ref 0.1–1.0)
RBC: 3.81 MIL/uL — ABNORMAL LOW (ref 3.87–5.11)
RDW: 17.6 % — ABNORMAL HIGH (ref 11.5–15.5)

## 2020-10-24 LAB — SURGICAL PATHOLOGY

## 2020-10-24 LAB — URIC ACID: Uric Acid, Serum: 3.9 mg/dL (ref 2.5–7.1)

## 2020-10-24 NOTE — Patient Instructions (Signed)
Thank you for choosing [facility] to provide your oncology and hematology care.   Should you have questions after your visit to the [facility] ([facility]), please contact this office at [phone] between 8:30 AM and 4:30 PM.  Voice mails left after 4:00 PM may not be returned until the following business day.  Calls received after 4:30 PM will be answered by an off-site Nurse Triage Line.    Prescription Refills:  Please have your pharmacy contact us directly for most prescription requests.  Contact the office directly for refills of narcotics (pain medications). Allow 48-72 hours for refills.  Appointments: Please contact the [facility] scheduling department [phone] for questions regarding [facility] appointment scheduling.  Contact the schedulers with any scheduling changes so that your appointment can be rescheduled in a timely manner.   Central Scheduling for [facility] [phone] - Call to schedule procedures such as PET scans, CT scans, MRI, Ultrasound, etc.  To afford each patient quality time with our providers, please arrive 30 minutes before your scheduled appointment time.  If you arrive late for your appointment, you may be asked to reschedule.  We strive to give you quality time with our providers, and arriving late affects you and other patients whose appointments are after yours. If you are a no show for multiple scheduled visits, you may be dismissed from the clinic at the providers discretion.     Resources: [facility] Social Workers [phone] for additional information on assistance programs or assistance connecting with community support programs   [place] DSS  [phone]: Information regarding food stamps, Medicaid, and utility assistance [place] [employer] [place] 336-333-6589   [place] [employer]'s shared-ride transportation service for eligible riders who have a disability that prevents them from riding the fixed route bus.   Medicare  Rights Center 800-333-4114 Helps people with Medicare understand their rights and benefits, navigate the Medicare system, and secure the quality healthcare they deserve [employer] 800-227-2345 Assists patients locate various types of support and financial assistance Cancer Care: 1-800-813-HOPE (4673) Provides financial assistance, online support groups, medication/co-pay assistance.   Transportation Assistance for appointments at [facility]: Transportation Coordinator [phone]  Again, thank you for choosing  Cancer Center for your care.       

## 2020-10-25 LAB — CBC WITH DIFFERENTIAL/PLATELET
Band Neutrophils: 0 %
Basophils Absolute: 0 10*3/uL (ref 0.0–0.1)
Blasts: 16 %
Eosinophils Absolute: 0 10*3/uL (ref 0.0–0.5)
Eosinophils Relative: 0 %
HCT: 36 % (ref 36.0–46.0)
Hemoglobin: 11.6 g/dL — ABNORMAL LOW (ref 12.0–15.0)
Lymphs Abs: 6.8 10*3/uL — ABNORMAL HIGH (ref 0.7–4.0)
MCH: 30.4 pg (ref 26.0–34.0)
MCHC: 32.2 g/dL (ref 30.0–36.0)
MCV: 94.5 fL (ref 80.0–100.0)
Metamyelocytes Relative: 0 %
Monocytes Relative: 3 %
Myelocytes: 4 %
Neutrophils Relative %: 62 %
Platelets: 841 10*3/uL — ABNORMAL HIGH (ref 150–400)
WBC: 45.5 10*3/uL — ABNORMAL HIGH (ref 4.0–10.5)
nRBC: 4.5 % — ABNORMAL HIGH (ref 0.0–0.2)
nRBC: 6 /100 WBC — ABNORMAL HIGH

## 2020-10-25 LAB — PATHOLOGIST SMEAR REVIEW

## 2020-10-26 LAB — FLOW CYTOMETRY

## 2020-11-01 LAB — MPN-ET/MYELOFIBROSIS (JAK2 V617F-MPL515-CALR)

## 2020-11-01 LAB — BCR ABL1 FISH (GENPATH)

## 2020-11-01 LAB — BCR/ABL

## 2020-11-06 ENCOUNTER — Telehealth: Payer: Self-pay | Admitting: *Deleted

## 2020-11-06 NOTE — Telephone Encounter (Signed)
Per Dr. Irene Limbo - contacted patient to offer appt on 12/15 at 12:30. Attempted home number x 2 - no answer. Attempted cell number x 2 - no answer and "VM not set up"

## 2020-11-07 ENCOUNTER — Other Ambulatory Visit: Payer: Self-pay | Admitting: Hematology

## 2020-11-07 ENCOUNTER — Telehealth: Payer: Self-pay | Admitting: Pharmacist

## 2020-11-07 ENCOUNTER — Telehealth: Payer: Self-pay

## 2020-11-07 ENCOUNTER — Other Ambulatory Visit: Payer: Self-pay

## 2020-11-07 ENCOUNTER — Inpatient Hospital Stay (HOSPITAL_BASED_OUTPATIENT_CLINIC_OR_DEPARTMENT_OTHER): Payer: BC Managed Care – PPO | Admitting: Hematology

## 2020-11-07 VITALS — BP 124/48 | HR 79 | Temp 99.0°F | Resp 18 | Ht 59.0 in | Wt 207.9 lb

## 2020-11-07 DIAGNOSIS — C9210 Chronic myeloid leukemia, BCR/ABL-positive, not having achieved remission: Secondary | ICD-10-CM | POA: Diagnosis not present

## 2020-11-07 DIAGNOSIS — D75839 Thrombocytosis, unspecified: Secondary | ICD-10-CM

## 2020-11-07 DIAGNOSIS — D72829 Elevated white blood cell count, unspecified: Secondary | ICD-10-CM | POA: Diagnosis not present

## 2020-11-07 MED ORDER — DASATINIB 100 MG PO TABS: 100.0000 mg | ORAL_TABLET | Freq: Every day | ORAL | 1 refills | Status: DC

## 2020-11-07 MED ORDER — ASPIRIN EC 81 MG PO TBEC: 81.0000 mg | DELAYED_RELEASE_TABLET | Freq: Every day | ORAL | 1 refills | Status: DC

## 2020-11-07 MED ORDER — ALLOPURINOL 100 MG PO TABS: 100.0000 mg | ORAL_TABLET | Freq: Two times a day (BID) | ORAL | 0 refills | Status: DC

## 2020-11-07 MED FILL — ALLOPURINOL 100 MG TABS: 100 | 30 days supply | Qty: 60 | Fill #0

## 2020-11-07 NOTE — Telephone Encounter (Signed)
Oral Oncology Patient Advocate Encounter  Received notification from [facility]  that prior authorization for [employer] is required.  PA submitted on CoverMyMeds Key [id number] Status is pending  Oral Oncology Clinic will continue to follow.  [facility] Phone [phone] Fax [phone] 11/07/2020 2:12 PM

## 2020-11-07 NOTE — Telephone Encounter (Signed)
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  Prescription dose and frequency assessed for appropriateness. Appropriate for therapy initiation.   CBC w/ Diff, CMP, and uric acid from 10/24/20 assessed, WBC 45.5 K/uL, pltc 841 K/uL, uric acid 3.9 mg/dL WNL - no baseline dose adjustments required.  Current medication list in Epic reviewed, DDIs with Sprycel identified:  Category C DDI between Sprycel and aspirin as well as naproxen - concomitant use of agents can increase bleed risk in patient. No dose adjustments recommended or change in therapy. Would recommend patient utilize acetaminophen PRN instead of naproxen.   Evaluated chart and no patient barriers to medication adherence noted.   Prescription has been e-scribed to the [facility] for benefits analysis and approval.  Oral Oncology Clinic will continue to follow for insurance authorization, copayment issues, initial counseling and start date.  Leron Croak, PharmD, BCPS Hematology/Oncology Clinical Pharmacist [facility] [phone] 11/07/2020 3:15 PM

## 2020-11-07 NOTE — Telephone Encounter (Signed)
Oral Oncology Patient Advocate Encounter  Prior Authorization for Sprycel has been approved.    PA# [id number] Effective dates: 11/07/20 through 06/07/21  Patients co-pay is $250  Oral Oncology Clinic will continue to follow.   Erica [name] Patient [place] Phone [phone] Fax [phone] 11/07/2020 4:11 PM

## 2020-11-07 NOTE — Telephone Encounter (Addendum)
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  Counseled patient on administration, dosing, side effects, monitoring, drug-food interactions, safe handling, storage, and disposal.  Patient will take Sprycel 100 mg tablets, 1 tablet by mouth once daily without regard to food.  Patient instructed that administering with food may decrease GI irritation.   Patient knows to avoid grapefruit or grapefruit juice while on therapy with Sprycel.  Sprycel start date: 11/12/20  Adverse effects include but are not limited to: fluid retention, nausea, diarrhea, rash, fatigue, dyspnea, hemorrhage, and thrombocytopenia.  Patient will obtain anti diarrheal and alert the office of 4 or more loose stools above baseline.  Reviewed with patient importance of keeping a medication schedule and plan for any missed doses. No barriers to medication adherence identified.  Medication reconciliation performed and medication/allergy list updated.  Insurance authorization currently pending for Sprycel . Copay card was obtained for patient bringing the copay for 1st fill of Sprycel to $0. Patient will pick up Sprycel fis rom [facility] on 11/09/20.   All questions answered.  Ms. Atkerson voiced understanding and appreciation.   Medication education handout given to patient. Patient knows to call the office with questions or concerns. Oral Chemotherapy Clinic phone number provided to patient.   Leron Croak, PharmD, BCPS Hematology/Oncology Clinical Pharmacist [facility] [phone] 11/07/2020 3:21 PM

## 2020-11-07 NOTE — Progress Notes (Signed)
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 female who has been referred to [place] by Dr. Kenton Kingfisher for evaluation and management of leukocytosis and thrombocytosis. The pt reports that she is doing well overall.   The pt reports that her labs in January of 2021 were normal. Her most recent labs was the first time that she was told of her leukocytosis and thrombocytosis. Pt notes that she has been more fatigued over the last month. Pt hit her leg in August and has had consistent leg swelling since then. She is currently on Lasix for leg swelling. Pt does not feel that her Diabetes has been stable as she has been eating too many sugary foods and was told to work on weight loss. Her Optometrist saw something of concern during her eye exam, which prompted him to order the 10/12/20 labs. Pt does not think that she was diagnosed with Diabetic Retinopathy. She has Glaucoma and avoids steroid use for that reason. Pt notes that Penicilin and Sulfa drugs cause rash.  Most recent lab results (10/12/2020) of CBC is as follows: all values are WNL except for WBC at 42.1K, RDW at 15.7, PLT at 875K, Neutro Abs at 25.3K, Lymphs Abs at 6.3K, Mono Abs at 1.3K, Baso Abs at 2.1K, nRBC at 7, Glucose at 274.  On review of systems, pt reports fatigue, leg swelling and denies bone pain, abnormal/excessive bleeding, low appetite, unexpected weight loss, back pain, abdominal pain and any other symptoms.   On PMHx the pt reports Diabetes, D&C, Cataract Surgery, Glaucoma, HTN, HLD, Eczema. On Social Hx the pt reports that she is a non-smoker and does not drink much alcohol. Pt currently works in the [employer] office at [employer]. On Family  Hx the pt reports that her sister had Breast Cancer.  INTERVAL HISTORY:   Erica [name] is a wonderful 74 y.o. female who is here for evaluation and management of leukocytosis and thrombocytosis. The patient's last visit with [place] was on 10/24/2020. The pt reports that she is doing well overall.  The pt reports she has had no new symptoms or concerns since our last visit. She is having some fatigue and stress associated with her new diagnosis. Pt has a Mammogram scheduled for 11/22/20. She has a follow up with Dr. Kenton Kingfisher at the beginning of the new year.   Of note since the patient's last visit, pt has had Flow Pathology Report [id number]) completed on 10/24/2020 with results revealing "Significant CD34-positive blastic population identified (19%)."  Lab results (10/24/20) of CBC w/diff and CMP is as follows: all values are WNL except for WBC at 45.5K, RBC at 3.81, Hgb at 11.6, RDW at 17.6, PLT at 841K, nRBC at 4.5, Neutro Abs at 28.2K, Lymphs Abs at 6.8K, Mono Abs at 1.4K, Abs Immature Granulocytes at 1.82K, Glucose at 300, AST at 42. 10/24/2020 Uric acid at 3.9  On review of systems, pt reports fatigue, stress and denies fevers, chills, night sweats, new bone pain, abdominal pain and any other symptoms.  MEDICAL HISTORY:  Past Medical History:  Diagnosis Date  . Bilateral swelling of feet   . Cataract   . Diabetes mellitus without complication (HCC)    diet controlled  . Eczema   .  Elevated cholesterol   . GERD (gastroesophageal reflux disease)   . Glaucoma   . Hypertension   . Mitral valve prolapse   . 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 Spondylisthesis     SURGICAL HISTORY: Past Surgical History:  Procedure Laterality Date  . CESAREAN SECTION    . DILATATION & CURETTAGE/HYSTEROSCOPY WITH MYOSURE N/A 08/05/2016   Procedure:  DILATATION & CURETTAGE/HYSTEROSCOPY WITH MYOSURE;  Surgeon: Anastasio Auerbach, MD;  Location: [facility];  Service: Gynecology;  Laterality: N/A;  . EXCISION OF SKIN TAG Left 08/05/2016   Procedure: EXCISION OF SKIN TAG;  Surgeon: Anastasio Auerbach, MD;  Location: [facility];  Service: Gynecology;  Laterality: Left;  . MOUTH SURGERY    . spinal tap      SOCIAL HISTORY: Social History   Socioeconomic History  . Marital status: Divorced    Spouse name: Not on file  . Number of children: Not on file  . Years of education: Not on file  . Highest education level: Not on file  Occupational History  . Not on file  Tobacco Use  . Smoking status: Never Smoker  . Smokeless tobacco: Never Used  Vaping Use  . Vaping Use: Never used  Substance and Sexual Activity  . Alcohol use: No    Alcohol/week: 0.0 standard drinks  . Drug use: No  . Sexual activity: Not Currently    Birth control/protection: Post-menopausal    Comment: 1st intercourse 74 yo-Fewer than 5 partners  Other Topics Concern  . Not on file  Social History Narrative  . Not on file   Social Determinants of Health   Financial Resource Strain: Not on file  Food Insecurity: Not on file  Transportation Needs: Not on file  Physical Activity: Not on file  Stress: Not on file  Social Connections: Not on file  Intimate Partner Violence: Not on file    FAMILY HISTORY: Family History  Problem Relation Age of Onset  . Heart disease Mother   . Heart disease Father   . Breast cancer Sister 63    ALLERGIES:  is allergic to penicillins and sulfa antibiotics.  MEDICATIONS:  Current Outpatient Medications  Medication Sig Dispense Refill  . allopurinol (ZYLOPRIM) 100 MG tablet Take 1 tablet (100 mg total) by mouth 2 (two) times daily. 60 tablet 0  . aspirin EC 81 MG tablet Take 1 tablet (81 mg total) by mouth daily. Swallow whole. 30 tablet 1  . dasatinib (SPRYCEL) 100 MG tablet Take 1 tablet (100 mg total) by mouth daily. 30 tablet  1  . doxycycline (VIBRA-TABS) 100 MG tablet Take 100 mg by mouth 2 (two) times daily.    . furosemide (LASIX) 20 MG tablet Take 20 mg by mouth daily.    Marland Kitchen gabapentin (NEURONTIN) 100 MG capsule Take 100 mg by mouth 3 (three) times daily.    Marland Kitchen latanoprost (XALATAN) 0.005 % ophthalmic solution Place 1 drop into both eyes at bedtime.     Marland Kitchen loratadine (CLARITIN) 10 MG tablet Take 10 mg by mouth daily as needed for allergies.    . metFORMIN (GLUCOPHAGE) 500 MG tablet Take by mouth 2 (two) times daily with a meal.    . naproxen sodium (ALEVE) 220 MG tablet Take 220 mg by mouth daily as needed (for foot pain).    . propranolol (INDERAL) 10 MG tablet Take 10 mg by mouth  3 (three) times daily.    . traMADol (ULTRAM) 50 MG tablet Take 50 mg by mouth every 6 (six) hours as needed for moderate pain.     Marland Kitchen triamcinolone cream (KENALOG) 0.1 % Apply 1 application topically 2 (two) times daily. Reported on 06/06/2016    . triamterene-hydrochlorothiazide (DYAZIDE) 37.5-25 MG capsule Take 1 capsule by mouth daily.     No current facility-administered medications for this visit.    REVIEW OF SYSTEMS:   A 10+ POINT REVIEW OF SYSTEMS WAS OBTAINED including neurology, dermatology, psychiatry, cardiac, respiratory, lymph, extremities, GI, GU, Musculoskeletal, constitutional, breasts, reproductive, HEENT.  All pertinent positives are noted in the HPI.  All others are negative.   PHYSICAL EXAMINATION: ECOG PERFORMANCE STATUS: 1 - Symptomatic but completely ambulatory  . Vitals:   11/07/20 1318  BP: (!) 124/48  Pulse: 79  Resp: 18  Temp: 99 F (37.2 C)  SpO2: 99%   Filed Weights   11/07/20 1318  Weight: 207 lb 14.4 oz (94.3 kg)   .Body mass index is 41.99 kg/m.  Exam was given in a chair   GENERAL:alert, in no acute distress and comfortable SKIN: no acute rashes, no significant lesions EYES: conjunctiva are pink and non-injected, sclera anicteric OROPHARYNX: MMM, no exudates, no oropharyngeal  erythema or ulceration NECK: supple, no JVD LYMPH:  no palpable lymphadenopathy in the cervical, axillary or inguinal regions LUNGS: clear to auscultation b/l with normal respiratory effort HEART: regular rate & rhythm ABDOMEN:  normoactive bowel sounds , non tender, not distended. No palpable hepatosplenomegaly.  Extremity: no pedal edema PSYCH: alert & oriented x 3 with fluent speech NEURO: no focal motor/sensory deficits   LABORATORY DATA:  I have reviewed the data as listed  . CBC Latest Ref Rng & Units 10/24/2020 07/31/2016  WBC 4.0 - 10.5 K/uL 45.5(H) 7.8  Hemoglobin 12.0 - 15.0 g/dL 11.6(L) 15.0  Hematocrit 36.0 - 46.0 % 36.0 43.8  Platelets 150 - 400 K/uL 841(H) 183    . CMP Latest Ref Rng & Units 10/24/2020 07/31/2016  Glucose 70 - 99 mg/dL 300(H) 285(H)  BUN 8 - 23 mg/dL 13 13  Creatinine 0.44 - 1.00 mg/dL 0.93 0.75  Sodium 135 - 145 mmol/L 136 135  Potassium 3.5 - 5.1 mmol/L 4.5 3.7  Chloride 98 - 111 mmol/L 101 99(L)  CO2 22 - 32 mmol/L 25 27  Calcium 8.9 - 10.3 mg/dL 9.6 9.7  Total Protein 6.5 - 8.1 g/dL 6.8 7.5  Total Bilirubin 0.3 - 1.2 mg/dL 0.5 0.5  Alkaline Phos 38 - 126 U/L 97 63  AST 15 - 41 U/L 42(H) 26  ALT 0 - 44 U/L 33 30   10/24/2020 Quantitative BCR/ABL:   10/24/2020 FISH - BCR/ABL:   10/24/2020 JAK2, MPL, & CALR sequencing:   10/24/2020 Flow Pathology Report [phone]):   RADIOGRAPHIC STUDIES: I have personally reviewed the radiological images as listed and agreed with the findings in the report. No results found.  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  Leucocytosis with myeloid left shift and increased blast 19% on flow cytometry  Thrombocytosis Patient declined BM Bx and other invasive evaluation Significant anxiety issues and chooses to pursue a limited conservative attempt at treatment. Previous discussion about BM Bx led to the patient not wanting to engage in any treatment or  f/u considerations. PLAN: -Discussed pt labwork, 10/24/20; WBC continue to climb, mild anemia, PLT are steady, Glucose is significantly  elevated, Uric acid is WNL. -Discussed 10/24/2020 Flow Pathology [phone]) which revealed "Significant CD34-positive blastic population identified (19%)." -Advised pt that her increasing WBC & PLT could impair the formation of other blood cells, causing anemia, and increase the risk of blood clots, hear attacks, and strokes.  -Advised pt that typical ALL would not show as much PLT elevation and would show more dramatic cytopenias. More likely CML.  -Advised pt that blasts in chronic phase CML are typically <10%. Accelerated phase CML is characterized by blasts between 11-19%.  -Advised pt that if blasts >20% this would be considered an acute leukemia -Advised pt that treating as if this is CML without complete evaluation could cause suboptimal treatment and missed diagnosis pt is okay with this.  -Advised pt that if she does not respond to Dasatinib we would likely have to complete additional testing for a definitive diagnosis- patient is non commital regarding this. -previously discussed her medical situation with her PCP - Dr Kenton Kingfisher as well who also helped with explaining the situation to her. -Advised pt that Dasatinib can cause fatigue and cytopenias and starting treatment would require frequent labwork.  -Advised pt that there are no contraindications to completing a Mammogram at this time. -Recommend pt hold Cataract procedure until blood counts are stable.  -Discussed referral to a mental health advocate - pt declines at this time.  -Will give pt the contact information for Edwyna Shell, our Social Worker. -Recommend pt f/u with Dr. Kenton Kingfisher for DM management . -Recommend pt begin a daily baby Aspirin -Rx Dasatinib @ 100mg  po daily. If tolerated and based on response might need to escalate to higher dose for accelerated phase CML   FOLLOW UP: Will  setup based on start date for dasatinib  The total time spent in the appt was 40 minutes and more than 50% was on counseling and direct patient cares.  All of the patient's questions were answered with apparent satisfaction. The patient knows to call the clinic with any problems, questions or concerns.    Sullivan Lone MD [place] AAHIVMS [facility] Hematology/Oncology Physician [facility]  (Office):       [phone] (Work cell):  [phone] (Fax):           [phone]  11/07/2020 3:27 PM  I, Yevette Edwards, am acting as a scribe for Dr. Sullivan Lone.   .I have reviewed the above documentation for accuracy and completeness, and I agree with the above. Brunetta Genera MD

## 2020-11-07 NOTE — Telephone Encounter (Signed)
Obtained a Sprycel BMS Copay Card to reduce patient's copay to $0.  Patient is allowed $15000 per year.  Bin [id number] PCN [id number] ID [id number]  [place] Patient [place] Phone [phone] Fax [phone] 11/07/2020 4:12 PM

## 2020-11-07 NOTE — Telephone Encounter (Signed)
Contacted patient - she will come today at 12:30 PM

## 2020-11-08 MED FILL — SPRYCEL 100 MG TABLET: 100 | 30 days supply | Qty: 30 | Fill #0

## 2020-11-09 NOTE — Addendum Note (Signed)
Addended byBritt Boozer on: 11/09/2020 03:24 PM   Modules accepted: Orders

## 2020-11-12 ENCOUNTER — Other Ambulatory Visit: Payer: Self-pay | Admitting: Hematology

## 2020-11-12 DIAGNOSIS — D471 Chronic myeloproliferative disease: Secondary | ICD-10-CM

## 2020-11-12 NOTE — Progress Notes (Signed)
Started on dasatinib for CML--Accelerated phase  High priority scheduling msg sent to schedule for clinic f/u with me in 2 weeks with labs for tox check (Dasatinib started on 12/20)  .[place]

## 2020-11-13 ENCOUNTER — Telehealth: Payer: Self-pay | Admitting: *Deleted

## 2020-11-13 ENCOUNTER — Telehealth: Payer: Self-pay | Admitting: Hematology

## 2020-11-13 NOTE — Telephone Encounter (Signed)
Scheduled apt per 12/20 sch msg - pt is aware of appt date and time

## 2020-11-13 NOTE — Telephone Encounter (Signed)
Pt called and stated she gets dizzy when taking allopurinol with aspirin and sprycel so she completely stopped taking it. Advised pt to take at least one tablet and at bedtime until Provider is made aware and directions are given. Also advised pt to space medication out and not to drive if having dizziness. Pt verbalized understanding.

## 2020-11-27 ENCOUNTER — Ambulatory Visit
Admission: RE | Admit: 2020-11-27 | Discharge: 2020-11-27 | Disposition: A | Discharge status: Home / Self Care | Payer: BC Managed Care – PPO | Source: Ambulatory Visit | Attending: Family Medicine | Admitting: Family Medicine

## 2020-11-27 ENCOUNTER — Other Ambulatory Visit: Payer: Self-pay | Admitting: Family Medicine

## 2020-11-27 DIAGNOSIS — M25552 Pain in left hip: Secondary | ICD-10-CM

## 2020-11-28 ENCOUNTER — Telehealth: Payer: Self-pay | Admitting: Hematology

## 2020-11-28 ENCOUNTER — Inpatient Hospital Stay: Payer: BC Managed Care – PPO | Attending: Hematology | Admitting: Hematology

## 2020-11-28 ENCOUNTER — Other Ambulatory Visit: Payer: Self-pay

## 2020-11-28 ENCOUNTER — Inpatient Hospital Stay: Payer: BC Managed Care – PPO

## 2020-11-28 VITALS — BP 140/61 | HR 75 | Temp 98.1°F | Resp 20 | Ht 59.0 in | Wt 207.2 lb

## 2020-11-28 DIAGNOSIS — M79605 Pain in left leg: Secondary | ICD-10-CM

## 2020-11-28 DIAGNOSIS — D471 Chronic myeloproliferative disease: Secondary | ICD-10-CM

## 2020-11-28 DIAGNOSIS — E119 Type 2 diabetes mellitus without complications: Secondary | ICD-10-CM | POA: Diagnosis not present

## 2020-11-28 DIAGNOSIS — D72829 Elevated white blood cell count, unspecified: Secondary | ICD-10-CM | POA: Insufficient documentation

## 2020-11-28 DIAGNOSIS — C9210 Chronic myeloid leukemia, BCR/ABL-positive, not having achieved remission: Secondary | ICD-10-CM | POA: Diagnosis not present

## 2020-11-28 DIAGNOSIS — H409 Unspecified glaucoma: Secondary | ICD-10-CM | POA: Insufficient documentation

## 2020-11-28 DIAGNOSIS — M7989 Other specified soft tissue disorders: Secondary | ICD-10-CM | POA: Insufficient documentation

## 2020-11-28 DIAGNOSIS — D75839 Thrombocytosis, unspecified: Secondary | ICD-10-CM | POA: Insufficient documentation

## 2020-11-28 DIAGNOSIS — K5900 Constipation, unspecified: Secondary | ICD-10-CM | POA: Insufficient documentation

## 2020-11-28 DIAGNOSIS — D649 Anemia, unspecified: Secondary | ICD-10-CM | POA: Insufficient documentation

## 2020-11-28 LAB — CBC WITH DIFFERENTIAL/PLATELET
Abs Immature Granulocytes: 0.04 10*3/uL (ref 0.00–0.07)
Basophils Absolute: 0.1 10*3/uL (ref 0.0–0.1)
Basophils Relative: 1 %
Eosinophils Absolute: 0.1 10*3/uL (ref 0.0–0.5)
Eosinophils Relative: 1 %
HCT: 31.9 % — ABNORMAL LOW (ref 36.0–46.0)
Hemoglobin: 10.4 g/dL — ABNORMAL LOW (ref 12.0–15.0)
Immature Granulocytes: 0 %
Lymphocytes Relative: 42 %
Lymphs Abs: 4.8 10*3/uL — ABNORMAL HIGH (ref 0.7–4.0)
MCH: 31.6 pg (ref 26.0–34.0)
MCHC: 32.6 g/dL (ref 30.0–36.0)
MCV: 97 fL (ref 80.0–100.0)
Monocytes Absolute: 0.5 10*3/uL (ref 0.1–1.0)
Monocytes Relative: 4 %
Neutro Abs: 6 10*3/uL (ref 1.7–7.7)
Neutrophils Relative %: 52 %
Platelets: 443 10*3/uL — ABNORMAL HIGH (ref 150–400)
RBC: 3.29 MIL/uL — ABNORMAL LOW (ref 3.87–5.11)
RDW: 16.8 % — ABNORMAL HIGH (ref 11.5–15.5)
WBC: 11.5 10*3/uL — ABNORMAL HIGH (ref 4.0–10.5)
nRBC: 0.2 % (ref 0.0–0.2)

## 2020-11-28 LAB — CMP (CANCER CENTER ONLY)
ALT: 24 U/L (ref 0–44)
AST: 21 U/L (ref 15–41)
Albumin: 3.9 g/dL (ref 3.5–5.0)
Alkaline Phosphatase: 117 U/L (ref 38–126)
Anion gap: 9 (ref 5–15)
BUN: 10 mg/dL (ref 8–23)
CO2: 27 mmol/L (ref 22–32)
Calcium: 9.2 mg/dL (ref 8.9–10.3)
Chloride: 104 mmol/L (ref 98–111)
Creatinine: 0.92 mg/dL (ref 0.44–1.00)
GFR, Estimated: >60 mL/min (ref >60)
Glucose, Bld: 191 mg/dL — ABNORMAL HIGH (ref 70–99)
Potassium: 4.1 mmol/L (ref 3.5–5.1)
Sodium: 140 mmol/L (ref 135–145)
Total Bilirubin: 0.7 mg/dL (ref 0.3–1.2)
Total Protein: 6.7 g/dL (ref 6.5–8.1)

## 2020-11-28 LAB — RETICULOCYTES
Immature Retic Fract: 28.1 % — ABNORMAL HIGH (ref 2.3–15.9)
RBC.: 3.31 MIL/uL — ABNORMAL LOW (ref 3.87–5.11)
Retic Count, Absolute: 97.6 10*3/uL (ref 19.0–186.0)
Retic Ct Pct: 3 % (ref 0.4–3.1)

## 2020-11-28 LAB — SAMPLE TO BLOOD BANK

## 2020-11-28 LAB — PHOSPHORUS: Phosphorus: 4 mg/dL (ref 2.5–4.6)

## 2020-11-28 LAB — URIC ACID: Uric Acid, Serum: 2 mg/dL — ABNORMAL LOW (ref 2.5–7.1)

## 2020-11-28 NOTE — Progress Notes (Signed)
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 female who has been referred to [place] by Dr. Kenton Kingfisher for evaluation and management of leukocytosis and thrombocytosis. The pt reports that she is doing well overall.   The pt reports that her labs in January of 2021 were normal. Her most recent labs was the first time that she was told of her leukocytosis and thrombocytosis. Pt notes that she has been more fatigued over the last month. Pt hit her leg in August and has had consistent leg swelling since then. She is currently on Lasix for leg swelling. Pt does not feel that her Diabetes has been stable as she has been eating too many sugary foods and was told to work on weight loss. Her Optometrist saw something of concern during her eye exam, which prompted him to order the 10/12/20 labs. Pt does not think that she was diagnosed with Diabetic Retinopathy. She has Glaucoma and avoids steroid use for that reason. Pt notes that Penicilin and Sulfa drugs cause rash.  Most recent lab results (10/12/2020) of CBC is as follows: all values are WNL except for WBC at 42.1K, RDW at 15.7, PLT at 875K, Neutro Abs at 25.3K, Lymphs Abs at 6.3K, Mono Abs at 1.3K, Baso Abs at 2.1K, nRBC at 7, Glucose at 274.  On review of systems, pt reports fatigue, leg swelling and denies bone pain, abnormal/excessive bleeding, low appetite, unexpected weight loss, back pain, abdominal pain and any other symptoms.   On PMHx the pt reports Diabetes, D&C, Cataract Surgery, Glaucoma, HTN, HLD, Eczema. On Social Hx the pt reports that she is a non-smoker and does not drink much alcohol. Pt currently works in the [employer] office at [employer]. On Family Hx  the pt reports that her sister had Breast Cancer.  INTERVAL HISTORY:   Erica [name] is a wonderful 75 y.o. female who is here for evaluation and management of leukocytosis and thrombocytosis. The patient's last visit with [place] was on 11/07/2020. The pt reports that she is doing well overall.  The pt reports that she has been following her Sprycel regiment for two weeks as instructed.   Since last visit, she has been experiencing leg pain that has affected her walking. There was no fall or event that led to this. She received an X-ray yesterday, which showed evidence of moderate left hip Osteoarthritis.   The pt reports that she has gotten both doses of COVID vaccine, but has not received booster.   Lab results today (11/28/20) of CBC w/diff and CMP is as follows: all values are WNL except for WBC at 11.5, RBC at 3.29, Hgb at 10.4, HCT at 31.9, RDW at 16.8, PLT at 443K, Glucose at 191. 11/28/2020 Uric acid at 2.0 11/28/2020 Phophorus at 4.0 11/28/2020 Reticulocytes of Retic Ct Pct of 3.0, Retic Count Abs at 97.6K, and Immature Retic Fract at 28.1.  On review of systems, pt reports slight leg pain/swelling, mild anxiety and denies fevers, chills, diarrhea, nausea, vomiting, abdominal pain and any other symptoms.   MEDICAL HISTORY:  Past Medical History:  Diagnosis Date  . Bilateral swelling of feet   . Cataract   . Diabetes mellitus without complication (HCC)    diet controlled  .  Eczema   . Elevated cholesterol   . GERD (gastroesophageal reflux disease)   . Glaucoma   . Hypertension   . Mitral valve prolapse   . 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 Spondylisthesis     SURGICAL HISTORY: Past Surgical History:  Procedure Laterality Date  . CESAREAN SECTION    . DILATATION & CURETTAGE/HYSTEROSCOPY WITH MYOSURE N/A 08/05/2016   Procedure:  DILATATION & CURETTAGE/HYSTEROSCOPY WITH MYOSURE;  Surgeon: Anastasio Auerbach, MD;  Location: [facility];  Service: Gynecology;  Laterality: N/A;  . EXCISION OF SKIN TAG Left 08/05/2016   Procedure: EXCISION OF SKIN TAG;  Surgeon: Anastasio Auerbach, MD;  Location: [facility];  Service: Gynecology;  Laterality: Left;  . MOUTH SURGERY    . spinal tap      SOCIAL HISTORY: Social History   Socioeconomic History  . Marital status: Divorced    Spouse name: Not on file  . Number of children: Not on file  . Years of education: Not on file  . Highest education level: Not on file  Occupational History  . Not on file  Tobacco Use  . Smoking status: Never Smoker  . Smokeless tobacco: Never Used  Vaping Use  . Vaping Use: Never used  Substance and Sexual Activity  . Alcohol use: No    Alcohol/week: 0.0 standard drinks  . Drug use: No  . Sexual activity: Not Currently    Birth control/protection: Post-menopausal    Comment: 1st intercourse 75 yo-Fewer than 5 partners  Other Topics Concern  . Not on file  Social History Narrative  . Not on file   Social Determinants of Health   Financial Resource Strain: Not on file  Food Insecurity: Not on file  Transportation Needs: Not on file  Physical Activity: Not on file  Stress: Not on file  Social Connections: Not on file  Intimate Partner Violence: Not on file    FAMILY HISTORY: Family History  Problem Relation Age of Onset  . Heart disease Mother   . Heart disease Father   . Breast cancer Sister 33    ALLERGIES:  is allergic to penicillins and sulfa antibiotics.  MEDICATIONS:  Current Outpatient Medications  Medication Sig Dispense Refill  . allopurinol (ZYLOPRIM) 100 MG tablet Take 1 tablet (100 mg total) by mouth 2 (two) times daily. 60 tablet 0  . aspirin EC 81 MG tablet Take 1 tablet (81 mg total) by mouth daily. Swallow whole. 30 tablet 1  . dasatinib (SPRYCEL) 100 MG tablet Take 1 tablet (100 mg total) by mouth daily. 30 tablet  1  . doxycycline (VIBRA-TABS) 100 MG tablet Take 100 mg by mouth 2 (two) times daily.    . furosemide (LASIX) 20 MG tablet Take 20 mg by mouth daily.    Marland Kitchen gabapentin (NEURONTIN) 100 MG capsule Take 100 mg by mouth 3 (three) times daily.    Marland Kitchen latanoprost (XALATAN) 0.005 % ophthalmic solution Place 1 drop into both eyes at bedtime.     Marland Kitchen loratadine (CLARITIN) 10 MG tablet Take 10 mg by mouth daily as needed for allergies.    . metFORMIN (GLUCOPHAGE) 500 MG tablet Take by mouth 2 (two) times daily with a meal.    . propranolol (INDERAL) 10 MG tablet Take 10 mg by mouth 3 (three) times daily.    . traMADol (ULTRAM) 50 MG tablet Take 50 mg by  mouth every 6 (six) hours as needed for moderate pain.     Marland Kitchen triamcinolone cream (KENALOG) 0.1 % Apply 1 application topically 2 (two) times daily. Reported on 06/06/2016    . triamterene-hydrochlorothiazide (DYAZIDE) 37.5-25 MG capsule Take 1 capsule by mouth daily.     No current facility-administered medications for this visit.    REVIEW OF SYSTEMS:   A 10+ POINT REVIEW OF SYSTEMS WAS OBTAINED including neurology, dermatology, psychiatry, cardiac, respiratory, lymph, extremities, GI, GU, Musculoskeletal, constitutional, breasts, reproductive, HEENT.  All pertinent positives are noted in the HPI.  All others are negative.   PHYSICAL EXAMINATION: ECOG PERFORMANCE STATUS: 1 - Symptomatic but completely ambulatory  . Vitals:   11/28/20 1158  BP: 140/61  Pulse: 75  Resp: 20  Temp: 98.1 F (36.7 C)  SpO2: 100%   Filed Weights   11/28/20 1158  Weight: 207 lb 3.2 oz (94 kg)   .Body mass index is 41.85 kg/m.   GENERAL:alert, in no acute distress and comfortable SKIN: no acute rashes, no significant lesions EYES: conjunctiva are pink and non-injected, sclera anicteric OROPHARYNX: MMM, no exudates, no oropharyngeal erythema or ulceration NECK: supple, no JVD LYMPH:  no palpable lymphadenopathy in the cervical, axillary or inguinal regions LUNGS:  clear to auscultation b/l with normal respiratory effort HEART: regular rate & rhythm ABDOMEN:  normoactive bowel sounds , non tender, not distended. No palpable hepatosplenomegaly.  Extremity: 1+ pedal edema b/l PSYCH: alert & oriented x 3 with fluent speech NEURO: no focal motor/sensory deficits  LABORATORY DATA:  I have reviewed the data as listed  . CBC Latest Ref Rng & Units 11/28/2020 10/24/2020 07/31/2016  WBC 4.0 - 10.5 K/uL 11.5(H) 45.5(H) 7.8  Hemoglobin 12.0 - 15.0 g/dL 10.4(L) 11.6(L) 15.0  Hematocrit 36.0 - 46.0 % 31.9(L) 36.0 43.8  Platelets 150 - 400 K/uL 443(H) 841(H) 183    . CMP Latest Ref Rng & Units 11/28/2020 10/24/2020 07/31/2016  Glucose 70 - 99 mg/dL 191(H) 300(H) 285(H)  BUN 8 - 23 mg/dL 10 13 13   Creatinine 0.44 - 1.00 mg/dL 0.92 0.93 0.75  Sodium 135 - 145 mmol/L 140 136 135  Potassium 3.5 - 5.1 mmol/L 4.1 4.5 3.7  Chloride 98 - 111 mmol/L 104 101 99(L)  CO2 22 - 32 mmol/L 27 25 27   Calcium 8.9 - 10.3 mg/dL 9.2 9.6 9.7  Total Protein 6.5 - 8.1 g/dL 6.7 6.8 7.5  Total Bilirubin 0.3 - 1.2 mg/dL 0.7 0.5 0.5  Alkaline Phos 38 - 126 U/L 117 97 63  AST 15 - 41 U/L 21 42(H) 26  ALT 0 - 44 U/L 24 33 30   10/24/2020 Quantitative BCR/ABL:   10/24/2020 FISH - BCR/ABL:   10/24/2020 JAK2, MPL, & CALR sequencing:   10/24/2020 Flow Pathology Report [id number]   RADIOGRAPHIC STUDIES: I have personally reviewed the radiological images as listed and agreed with the findings in the report. DG HIP UNILAT WITH PELVIS 2-3 VIEWS LEFT  Result Date: 11/27/2020 CLINICAL DATA:  Left hip pain. EXAM: DG HIP (WITH OR WITHOUT PELVIS) 2-3V LEFT COMPARISON:  09/01/2017 FINDINGS: There is moderate left hip osteoarthritis. There is no acute displaced fracture or dislocation. And the osseous mineralization is within normal limits. IMPRESSION: Moderate left hip osteoarthritis. Electronically Signed   By: Constance Holster M.D.   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  Leucocytosis with myeloid left shift and  increased blast 19% on flow cytometry  Thrombocytosis Patient declined BM Bx and other invasive evaluation Significant anxiety issues and chooses to pursue a limited conservative attempt at treatment. Previous discussion about BM Bx led to the patient not wanting to engage in any treatment or f/u considerations. PLAN: -Discussed pt labwork today, 11/28/20; WBC have nearly normalized, PLT have decreased close to nml, mild but unconcerning anemia, Uric acid looks good, Phosphorus WNL.  -The pt has no prohibitive toxicities from continuing 100 mg Sprycel daily at this time. -Continue ASA, Lasix, and Allopurinol. -Recommend pt get Venous Ultrasound LLE to r/o DVT.  -Advised pt to receive COVID19 booster ASAP. She has agreed to schedule it alongside labs in 2 weeks.  -Will see back in 2 weeks with labs.   FOLLOW UP: US venous LLE stat today Labs and MD visit in 2 weeks and appointment for covid booster vaccine   The total time spent in the appt was 30 minutes and more than 50% was on counseling and direct patient cares.  All of the patient's questions were answered with apparent satisfaction. The patient knows to call the clinic with any problems, questions or concerns.    Wyvonnia Lora MD MS AAHIVMS [facility] Hematology/Oncology Physician [facility]  (Office):       [phone] (Work cell):  [phone] (Fax):           [phone]  11/28/2020 1:03 PM  I, Carollee Herter, am acting as a scribe for Dr. Wyvonnia Lora.   .I have reviewed the above documentation for accuracy and completeness, and I agree with the above. Johney Maine MD   ADDENDUM  US venous LE -- no evidence of DVT  .Johney Maine MD

## 2020-11-28 NOTE — Telephone Encounter (Signed)
Scheduled appointments per 1/5 los. Spoke to patient who is aware of appointments dates and times. Gave patient calendar print out.  

## 2020-12-03 ENCOUNTER — Ambulatory Visit (HOSPITAL_COMMUNITY)
Admission: RE | Admit: 2020-12-03 | Discharge: 2020-12-03 | Disposition: A | Discharge status: Home / Self Care | Payer: BC Managed Care – PPO | Source: Ambulatory Visit | Attending: Hematology | Admitting: Hematology

## 2020-12-03 ENCOUNTER — Other Ambulatory Visit: Payer: Self-pay

## 2020-12-03 DIAGNOSIS — M79605 Pain in left leg: Secondary | ICD-10-CM | POA: Diagnosis not present

## 2020-12-03 DIAGNOSIS — C9210 Chronic myeloid leukemia, BCR/ABL-positive, not having achieved remission: Secondary | ICD-10-CM | POA: Insufficient documentation

## 2020-12-03 NOTE — Progress Notes (Signed)
Left lower extremity venous study completed.     Please see CV Proc for preliminary results.   Vonzell Schlatter, RVT

## 2020-12-05 ENCOUNTER — Telehealth: Payer: Self-pay | Admitting: *Deleted

## 2020-12-05 NOTE — Telephone Encounter (Signed)
Information given to patient - patient verbalized understanding

## 2020-12-05 NOTE — Telephone Encounter (Signed)
-----   Message from Brunetta Genera, MD sent at 12/04/2020  3:59 PM EST ----- PLz let patient know -- no blood clots noted on [place]. Leg swelling likely from fluid retention. Continue lasix as recommended and minimize salt intake. thx GK

## 2020-12-10 MED FILL — SPRYCEL 100 MG TABLET: 100 | 30 days supply | Qty: 30 | Fill #1

## 2020-12-12 ENCOUNTER — Other Ambulatory Visit: Payer: Self-pay | Admitting: Hematology

## 2020-12-12 ENCOUNTER — Other Ambulatory Visit: Payer: Self-pay | Admitting: *Deleted

## 2020-12-12 DIAGNOSIS — C9210 Chronic myeloid leukemia, BCR/ABL-positive, not having achieved remission: Secondary | ICD-10-CM

## 2020-12-12 MED ORDER — ALLOPURINOL 100 MG PO TABS: 100.0000 mg | ORAL_TABLET | Freq: Two times a day (BID) | ORAL | 0 refills | Status: DC

## 2020-12-12 MED FILL — ALLOPURINOL 100 MG TABS: 100 | 30 days supply | Qty: 60 | Fill #0

## 2020-12-16 NOTE — Progress Notes (Signed)
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 female who has been referred to [place] by Dr. Kenton Kingfisher for evaluation and management of leukocytosis and thrombocytosis. The pt reports that she is doing well overall.   The pt reports that her labs in January of 2021 were normal. Her most recent labs was the first time that she was told of her leukocytosis and thrombocytosis. Pt notes that she has been more fatigued over the last month. Pt hit her leg in August and has had consistent leg swelling since then. She is currently on Lasix for leg swelling. Pt does not feel that her Diabetes has been stable as she has been eating too many sugary foods and was told to work on weight loss. Her Optometrist saw something of concern during her eye exam, which prompted him to order the 10/12/20 labs. Pt does not think that she was diagnosed with Diabetic Retinopathy. She has Glaucoma and avoids steroid use for that reason. Pt notes that Penicilin and Sulfa drugs cause rash.  Most recent lab results (10/12/2020) of CBC is as follows: all values are WNL except for WBC at 42.1K, RDW at 15.7, PLT at 875K, Neutro Abs at 25.3K, Lymphs Abs at 6.3K, Mono Abs at 1.3K, Baso Abs at 2.1K, nRBC at 7, Glucose at 274.  On review of systems, pt reports fatigue, leg swelling and denies bone pain, abnormal/excessive bleeding, low appetite, unexpected weight loss, back pain, abdominal pain and any other symptoms.   On PMHx the pt reports Diabetes, D&C, Cataract Surgery, Glaucoma, HTN, HLD, Eczema. On Social Hx the pt reports that she is a non-smoker and does not drink much alcohol. Pt currently works in the [employer] office at [employer]. On Family Hx  the pt reports that her sister had Breast Cancer.  INTERVAL HISTORY:   Erica [name] is a wonderful 75 y.o. female who is here for evaluation and management of leukocytosis and thrombocytosis. The patient's last visit with [place] was on 11/28/2020. The pt reports that she is doing well overall.  The pt reports no new symptoms or concerns at this time. She notes that the pain and soreness in her leg has been improved. She notes no new medication changes.   The pt notes that she has been experiencing some constipation.  The pt notes that she is pondering retirement soon and desires to enjoy her life at home.   Of note since the patient's last visit, pt has had VAS [place] Lower Extremities Venous completed on 12/03/2020, which revealed "RIGHT: - No evidence of common femoral vein obstruction. LEFT: - There is no evidence of deep vein thrombosis in the lower extremity. - No cystic structure found in the popliteal fossa."  Lab results today 12/17/2020 of CBC w/diff and CMP is as follows: all values are WNL except for RBC at 3.58, Hgb at 11.3, HCT at 34.9, Glucose at 205. 12/17/2020 Uric Acid of 2.6.   On review of systems, pt reports constipation and denies SOB, leg swelling, n/v/d, new bone pain, abdominal pain, back pain and any other symptoms.  MEDICAL HISTORY:  Past Medical History:  Diagnosis Date  . Bilateral swelling of feet   . Cataract   . Diabetes mellitus without complication (  [place])    diet controlled  . Eczema   . Elevated cholesterol   . GERD (gastroesophageal reflux disease)   . Glaucoma   . Hypertension   . Mitral valve prolapse   . 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 Spondylisthesis     SURGICAL HISTORY: Past Surgical History:  Procedure Laterality Date  . CESAREAN SECTION    . DILATATION & CURETTAGE/HYSTEROSCOPY WITH MYOSURE N/A  08/05/2016   Procedure: DILATATION & CURETTAGE/HYSTEROSCOPY WITH MYOSURE;  Surgeon: Anastasio Auerbach, MD;  Location: [facility];  Service: Gynecology;  Laterality: N/A;  . EXCISION OF SKIN TAG Left 08/05/2016   Procedure: EXCISION OF SKIN TAG;  Surgeon: Anastasio Auerbach, MD;  Location: [facility];  Service: Gynecology;  Laterality: Left;  . MOUTH SURGERY    . spinal tap      SOCIAL HISTORY: Social History   Socioeconomic History  . Marital status: Divorced    Spouse name: Not on file  . Number of children: Not on file  . Years of education: Not on file  . Highest education level: Not on file  Occupational History  . Not on file  Tobacco Use  . Smoking status: Never Smoker  . Smokeless tobacco: Never Used  Vaping Use  . Vaping Use: Never used  Substance and Sexual Activity  . Alcohol use: No    Alcohol/week: 0.0 standard drinks  . Drug use: No  . Sexual activity: Not Currently    Birth control/protection: Post-menopausal    Comment: 1st intercourse 75 yo-Fewer than 5 partners  Other Topics Concern  . Not on file  Social History Narrative  . Not on file   Social Determinants of Health   Financial Resource Strain: Not on file  Food Insecurity: Not on file  Transportation Needs: Not on file  Physical Activity: Not on file  Stress: Not on file  Social Connections: Not on file  Intimate Partner Violence: Not on file    FAMILY HISTORY: Family History  Problem Relation Age of Onset  . Heart disease Mother   . Heart disease Father   . Breast cancer Sister 104    ALLERGIES:  is allergic to penicillins and sulfa antibiotics.  MEDICATIONS:  Current Outpatient Medications  Medication Sig Dispense Refill  . allopurinol (ZYLOPRIM) 100 MG tablet Take 1 tablet (100 mg total) by mouth 2 (two) times daily. 60 tablet 0  . aspirin EC 81 MG tablet Take 1 tablet (81 mg total) by mouth daily. Swallow whole. 30 tablet 1  . dasatinib (SPRYCEL) 100 MG tablet Take 1 tablet (100 mg total)  by mouth daily. 30 tablet 1  . doxycycline (VIBRA-TABS) 100 MG tablet Take 100 mg by mouth 2 (two) times daily.    . furosemide (LASIX) 20 MG tablet Take 20 mg by mouth daily.    Marland Kitchen gabapentin (NEURONTIN) 100 MG capsule Take 100 mg by mouth 3 (three) times daily.    Marland Kitchen latanoprost (XALATAN) 0.005 % ophthalmic solution Place 1 drop into both eyes at bedtime.     Marland Kitchen loratadine (CLARITIN) 10 MG tablet Take 10 mg by mouth daily as needed for allergies.    . metFORMIN (GLUCOPHAGE) 500 MG tablet Take by mouth 2 (two) times daily with a meal.    . propranolol (INDERAL) 10 MG tablet Take 10 mg by mouth 3 (three) times daily.    . traMADol (  ULTRAM) 50 MG tablet Take 50 mg by mouth every 6 (six) hours as needed for moderate pain.     Marland Kitchen triamcinolone cream (KENALOG) 0.1 % Apply 1 application topically 2 (two) times daily. Reported on 06/06/2016    . triamterene-hydrochlorothiazide (DYAZIDE) 37.5-25 MG capsule Take 1 capsule by mouth daily.     No current facility-administered medications for this visit.    REVIEW OF SYSTEMS:   10 Point review of Systems was done is negative except as noted above.  PHYSICAL EXAMINATION: ECOG PERFORMANCE STATUS: 1 - Symptomatic but completely ambulatory  . Vitals:   12/17/20 0939  BP: (!) 185/72  Pulse: 82  Resp: 18  Temp: 98.1 F (36.7 C)  SpO2: 100%   Filed Weights   12/17/20 0939  Weight: 200 lb 4.8 oz (90.9 kg)   .Body mass index is 40.46 kg/m.    GENERAL:alert, in no acute distress and comfortable SKIN: no acute rashes, no significant lesions EYES: conjunctiva are pink and non-injected, sclera anicteric OROPHARYNX: MMM, no exudates, no oropharyngeal erythema or ulceration NECK: supple, no JVD LYMPH:  no palpable lymphadenopathy in the cervical, axillary or inguinal regions LUNGS: clear to auscultation b/l with normal respiratory effort HEART: regular rate & rhythm ABDOMEN:  normoactive bowel sounds , non tender, not distended. Extremity: no pedal  edema PSYCH: alert & oriented x 3 with fluent speech NEURO: no focal motor/sensory deficits  LABORATORY DATA:  I have reviewed the data as listed  . CBC Latest Ref Rng & Units 12/17/2020 11/28/2020 10/24/2020  WBC 4.0 - 10.5 K/uL 4.0 11.5(H) 45.5(H)  Hemoglobin 12.0 - 15.0 g/dL 11.3(L) 10.4(L) 11.6(L)  Hematocrit 36.0 - 46.0 % 34.9(L) 31.9(L) 36.0  Platelets 150 - 400 K/uL 154 443(H) 841(H)    . CMP Latest Ref Rng & Units 12/17/2020 11/28/2020 10/24/2020  Glucose 70 - 99 mg/dL 205(H) 191(H) 300(H)  BUN 8 - 23 mg/dL 16 10 13   Creatinine 0.44 - 1.00 mg/dL 0.96 0.92 0.93  Sodium 135 - 145 mmol/L 137 140 136  Potassium 3.5 - 5.1 mmol/L 4.0 4.1 4.5  Chloride 98 - 111 mmol/L 104 104 101  CO2 22 - 32 mmol/L 25 27 25   Calcium 8.9 - 10.3 mg/dL 9.5 9.2 9.6  Total Protein 6.5 - 8.1 g/dL 7.0 6.7 6.8  Total Bilirubin 0.3 - 1.2 mg/dL 0.6 0.7 0.5  Alkaline Phos 38 - 126 U/L 102 117 97  AST 15 - 41 U/L 23 21 42(H)  ALT 0 - 44 U/L 22 24 33    10/24/2020 Quantitative BCR/ABL:   10/24/2020 FISH - BCR/ABL:   10/24/2020 JAK2, MPL, & CALR sequencing:   10/24/2020 Flow Pathology Report [phone]):   RADIOGRAPHIC STUDIES: I have personally reviewed the radiological images as listed and agreed with the findings in the report. DG HIP UNILAT WITH PELVIS 2-3 VIEWS LEFT  Result Date: 11/27/2020 CLINICAL DATA:  Left hip pain. EXAM: DG HIP (WITH OR WITHOUT PELVIS) 2-3V LEFT COMPARISON:  09/01/2017 FINDINGS: There is moderate left hip osteoarthritis. There is no acute displaced fracture or dislocation. And the osseous mineralization is within normal limits. IMPRESSION: Moderate left hip osteoarthritis. Electronically Signed   By: Constance Holster M.D.   On: 11/27/2020 15:42   VAS [place] LOWER EXTREMITY VENOUS (DVT)  Result Date: 12/03/2020  Lower Venous DVT Study Indications: Pain.  Risk Factors: None identified. Comparison Study: No previous exam Performing Technologist: Vonzell Schlatter RVT  Examination  Guidelines: A complete evaluation includes B-mode imaging, spectral Doppler, color Doppler,  and power Doppler as needed of all accessible portions of each vessel. Bilateral testing is considered an integral part of a complete examination. Limited examinations for reoccurring indications may be performed as noted. The reflux portion of the exam is performed with the patient in reverse Trendelenburg.  +-----+---------------+---------+-----------+----------+--------------+ RIGHTCompressibilityPhasicitySpontaneityPropertiesThrombus Aging +-----+---------------+---------+-----------+----------+--------------+ CFV  Full           Yes      Yes                                 +-----+---------------+---------+-----------+----------+--------------+ SFJ  Full                                                        +-----+---------------+---------+-----------+----------+--------------+   +---------+---------------+---------+-----------+----------+--------------+ LEFT     CompressibilityPhasicitySpontaneityPropertiesThrombus Aging +---------+---------------+---------+-----------+----------+--------------+ CFV      Full           Yes      Yes                                 +---------+---------------+---------+-----------+----------+--------------+ SFJ      Full                                                        +---------+---------------+---------+-----------+----------+--------------+ FV Prox  Full                                                        +---------+---------------+---------+-----------+----------+--------------+ FV Mid   Full                                                        +---------+---------------+---------+-----------+----------+--------------+ FV DistalFull                                                        +---------+---------------+---------+-----------+----------+--------------+ PFV      Full                                                         +---------+---------------+---------+-----------+----------+--------------+ POP      Full           Yes      Yes                                 +---------+---------------+---------+-----------+----------+--------------+ PTV  Full                                                        +---------+---------------+---------+-----------+----------+--------------+ PERO     Full                                                        +---------+---------------+---------+-----------+----------+--------------+     Summary: RIGHT: - No evidence of common femoral vein obstruction.  LEFT: - There is no evidence of deep vein thrombosis in the lower extremity.  - No cystic structure found in the popliteal fossa.  *See table(s) above for measurements and observations. Electronically signed by Jamelle Haring on 12/03/2020 at 7:18:45 PM.    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 Previous discussion about BM Bx led to the patient not wanting to engage in any treatment or f/u considerations.  PLAN: -Discussed pt labwork today, 12/17/2020; uric acid normal, blood counts and chemistries good, Glucose level elevated.  -Discussed 12/03/2020 VAS [place] Lower Extremities Venous; no evidence of blood clot in lower leg.  -The pt has no prohibitive toxicities from continuing 100 mg Sprycel daily at this time. Continue full dose, no need to elevate dose due to normalized WBC.  -Advised pt she can stop Allopurinol due to normal Uric acid levels.  -Advised pt that goal is to normalize counts before 3 months and have BCR ABL levels below 10%. The pts wbc count has normalized within a month and we will continue to  monitor the levels.  -Advised pt that we will continue Sprycel until mutation has remained undetectable for two years.  -Recommend pt increase water intake and drink at least 48-64 oz/daily. -Continue ASA, Lasix. -Will get COVID booster today. -Will get labs in 4 weeks.  -Will see back in 2 months with labs.   FOLLOW UP: Covid booster today Labs in 1 month RTC with Dr Irene Limbo with labs in 2 months    The total time spent in the appointment was 30 minutes and more than 50% was on counseling and direct patient cares.  All of the patient's questions were answered with apparent satisfaction. The patient knows to call the clinic with any problems, questions or concerns.    Sullivan Lone MD [place] [facility] Hematology/Oncology Physician [facility]  (Office):       [phone] (Work cell):  [phone] (Fax):           [phone]  12/16/2020 9:02 AM  I, Reinaldo Raddle, am acting as scribe for Dr. Sullivan Lone, MD.    I have reviewed the above documentation for accuracy and completeness, and I agree with the above. Brunetta Genera MD

## 2020-12-17 ENCOUNTER — Inpatient Hospital Stay: Payer: BC Managed Care – PPO | Admitting: Hematology

## 2020-12-17 ENCOUNTER — Other Ambulatory Visit: Payer: Self-pay

## 2020-12-17 ENCOUNTER — Inpatient Hospital Stay: Payer: BC Managed Care – PPO

## 2020-12-17 VITALS — BP 185/72 | HR 82 | Temp 98.1°F | Resp 18 | Ht 59.0 in | Wt 200.3 lb

## 2020-12-17 DIAGNOSIS — C9210 Chronic myeloid leukemia, BCR/ABL-positive, not having achieved remission: Secondary | ICD-10-CM | POA: Diagnosis not present

## 2020-12-17 DIAGNOSIS — D72829 Elevated white blood cell count, unspecified: Secondary | ICD-10-CM | POA: Diagnosis not present

## 2020-12-17 LAB — CMP (CANCER CENTER ONLY)
ALT: 22 U/L (ref 0–44)
AST: 23 U/L (ref 15–41)
Albumin: 4.2 g/dL (ref 3.5–5.0)
Alkaline Phosphatase: 102 U/L (ref 38–126)
Anion gap: 8 (ref 5–15)
BUN: 16 mg/dL (ref 8–23)
CO2: 25 mmol/L (ref 22–32)
Calcium: 9.5 mg/dL (ref 8.9–10.3)
Chloride: 104 mmol/L (ref 98–111)
Creatinine: 0.96 mg/dL (ref 0.44–1.00)
GFR, Estimated: >60 mL/min (ref >60)
Glucose, Bld: 205 mg/dL — ABNORMAL HIGH (ref 70–99)
Potassium: 4 mmol/L (ref 3.5–5.1)
Sodium: 137 mmol/L (ref 135–145)
Total Bilirubin: 0.6 mg/dL (ref 0.3–1.2)
Total Protein: 7 g/dL (ref 6.5–8.1)

## 2020-12-17 LAB — CBC WITH DIFFERENTIAL/PLATELET
Abs Immature Granulocytes: 0.01 10*3/uL (ref 0.00–0.07)
Basophils Absolute: 0 10*3/uL (ref 0.0–0.1)
Basophils Relative: 0 %
Eosinophils Absolute: 0 10*3/uL (ref 0.0–0.5)
Eosinophils Relative: 1 %
HCT: 34.9 % — ABNORMAL LOW (ref 36.0–46.0)
Hemoglobin: 11.3 g/dL — ABNORMAL LOW (ref 12.0–15.0)
Immature Granulocytes: 0 %
Lymphocytes Relative: 34 %
Lymphs Abs: 1.4 10*3/uL (ref 0.7–4.0)
MCH: 31.6 pg (ref 26.0–34.0)
MCHC: 32.4 g/dL (ref 30.0–36.0)
MCV: 97.5 fL (ref 80.0–100.0)
Monocytes Absolute: 0.2 10*3/uL (ref 0.1–1.0)
Monocytes Relative: 6 %
Neutro Abs: 2.4 10*3/uL (ref 1.7–7.7)
Neutrophils Relative %: 59 %
Platelets: 154 10*3/uL (ref 150–400)
RBC: 3.58 MIL/uL — ABNORMAL LOW (ref 3.87–5.11)
RDW: 15.5 % (ref 11.5–15.5)
WBC: 4 10*3/uL (ref 4.0–10.5)
nRBC: 0 % (ref 0.0–0.2)

## 2020-12-17 LAB — SAMPLE TO BLOOD BANK

## 2020-12-17 LAB — URIC ACID: Uric Acid, Serum: 2.6 mg/dL (ref 2.5–7.1)

## 2020-12-18 ENCOUNTER — Telehealth: Payer: Self-pay | Admitting: Hematology

## 2020-12-18 NOTE — Telephone Encounter (Signed)
Attempted to contact patient regarding scheduling a COVID booster per 1/25 schedule message. Home phone unavailable and cell phone has no voicemail capabilities.

## 2021-01-01 ENCOUNTER — Other Ambulatory Visit: Payer: Self-pay | Admitting: Hematology

## 2021-01-03 MED FILL — SPRYCEL 100 MG TABLET: 100 | 30 days supply | Qty: 30 | Fill #0

## 2021-01-29 ENCOUNTER — Telehealth: Payer: Self-pay | Admitting: Hematology

## 2021-01-29 NOTE — Telephone Encounter (Signed)
Scheduled follow-up appointment per 3/8 schedule message. Patient is aware.

## 2021-01-30 MED FILL — SPRYCEL 100 MG TABLET: 100 | 30 days supply | Qty: 30 | Fill #1

## 2021-02-11 ENCOUNTER — Other Ambulatory Visit: Payer: BC Managed Care – PPO

## 2021-02-13 ENCOUNTER — Other Ambulatory Visit: Payer: Self-pay | Admitting: *Deleted

## 2021-02-13 DIAGNOSIS — C9210 Chronic myeloid leukemia, BCR/ABL-positive, not having achieved remission: Secondary | ICD-10-CM

## 2021-02-14 NOTE — Progress Notes (Signed)
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 female who has been referred to [place] by Dr. Kenton Kingfisher for evaluation and management of leukocytosis and thrombocytosis. The pt reports that she is doing well overall.   The pt reports that her labs in January of 2021 were normal. Her most recent labs was the first time that she was told of her leukocytosis and thrombocytosis. Pt notes that she has been more fatigued over the last month. Pt hit her leg in August and has had consistent leg swelling since then. She is currently on Lasix for leg swelling. Pt does not feel that her Diabetes has been stable as she has been eating too many sugary foods and was told to work on weight loss. Her Optometrist saw something of concern during her eye exam, which prompted him to order the 10/12/20 labs. Pt does not think that she was diagnosed with Diabetic Retinopathy. She has Glaucoma and avoids steroid use for that reason. Pt notes that Penicilin and Sulfa drugs cause rash.  Most recent lab results (10/12/2020) of CBC is as follows: all values are WNL except for WBC at 42.1K, RDW at 15.7, PLT at 875K, Neutro Abs at 25.3K, Lymphs Abs at 6.3K, Mono Abs at 1.3K, Baso Abs at 2.1K, nRBC at 7, Glucose at 274.  On review of systems, pt reports fatigue, leg swelling and denies bone pain, abnormal/excessive bleeding, low appetite, unexpected weight loss, back pain, abdominal pain and any other symptoms.   On PMHx the pt reports Diabetes, D&C, Cataract Surgery, Glaucoma, HTN, HLD, Eczema. On Social Hx the pt reports that she is a non-smoker and does not drink much alcohol. Pt currently works in the [employer] office at [employer]. On Family Hx the pt reports that  her sister had Breast Cancer.  INTERVAL HISTORY:  Erica [name] is a wonderful 75 y.o. female who is here for evaluation and management of leukocytosis and thrombocytosis. The patient's last visit with [place] was on 12/17/2020. The pt reports that she is doing well overall.  The pt reports no new concerns over the last two months. She notes that she felt a bump on her left leg close to her ankle. The pt notes this is similar to the lump she has on her left arm that the doctor told her was unconcerning. The pt has had no issues taking or tolerating her Sprycel and notes no missed doses. The pt notes she has been sleeping well and drinking enough water. The pt notes some mild fatigue, but this is not bothersome nor impedes her way of life daily.  Lab results today 02/15/2021 of CBC w/diff and CMP is as follows: all values are WNL except for WBC of 12.4K, Lymphs Abs of 5.9K, Glucose of 189. 02/15/2021 Uric Acid of 2.8. 02/15/2021 LDH of 246.   On review of systems, pt reports intermittent acne, fatigue, anxiety, intermittent gas and denies n/v/d, acute skin rashes, new bone pains, back pain, abdominal pain, changes in bowel habits, and any other symptoms.  MEDICAL HISTORY:  Past Medical History:  Diagnosis Date  . Bilateral swelling of feet   . Cataract   . Diabetes mellitus without complication (HCC)    diet controlled  . Eczema   . Elevated  cholesterol   . GERD (gastroesophageal reflux disease)   . Glaucoma   . Hypertension   . Mitral valve prolapse   . 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 Spondylisthesis     SURGICAL HISTORY: Past Surgical History:  Procedure Laterality Date  . CESAREAN SECTION    . DILATATION & CURETTAGE/HYSTEROSCOPY WITH MYOSURE N/A 08/05/2016   Procedure: DILATATION & CURETTAGE/HYSTEROSCOPY WITH MYOSURE;  Surgeon: Anastasio Auerbach, MD;  Location: [facility];  Service: Gynecology;  Laterality: N/A;  . EXCISION OF SKIN TAG Left 08/05/2016   Procedure: EXCISION OF SKIN TAG;  Surgeon: Anastasio Auerbach, MD;  Location: [facility];  Service: Gynecology;  Laterality: Left;  . MOUTH SURGERY    . spinal tap      SOCIAL HISTORY: Social History   Socioeconomic History  . Marital status: Divorced    Spouse name: Not on file  . Number of children: Not on file  . Years of education: Not on file  . Highest education level: Not on file  Occupational History  . Not on file  Tobacco Use  . Smoking status: Never Smoker  . Smokeless tobacco: Never Used  Vaping Use  . Vaping Use: Never used  Substance and Sexual Activity  . Alcohol use: No    Alcohol/week: 0.0 standard drinks  . Drug use: No  . Sexual activity: Not Currently    Birth control/protection: Post-menopausal    Comment: 1st intercourse 75 yo-Fewer than 5 partners  Other Topics Concern  . Not on file  Social History Narrative  . Not on file   Social Determinants of Health   Financial Resource Strain: Not on file  Food Insecurity: Not on file  Transportation Needs: Not on file  Physical Activity: Not on file  Stress: Not on file  Social Connections: Not on file  Intimate Partner Violence: Not on file    FAMILY HISTORY: Family History  Problem Relation Age of Onset  . Heart disease Mother   . Heart disease Father   . Breast cancer Sister 15    ALLERGIES:  is allergic to penicillins and sulfa antibiotics.  MEDICATIONS:  Current Outpatient Medications  Medication Sig Dispense Refill  . allopurinol (ZYLOPRIM) 100 MG tablet Take 1 tablet (100 mg total) by mouth 2 (two) times daily. 60 tablet 0  . aspirin EC 81 MG tablet Take 1 tablet (81 mg total) by mouth daily. Swallow whole. 30 tablet 1  . doxycycline (VIBRA-TABS) 100 MG tablet Take 100 mg by mouth 2 (two) times daily.    . furosemide (LASIX) 20 MG tablet Take 20 mg by mouth daily.    Marland Kitchen  gabapentin (NEURONTIN) 100 MG capsule Take 100 mg by mouth 3 (three) times daily.    Marland Kitchen latanoprost (XALATAN) 0.005 % ophthalmic solution Place 1 drop into both eyes at bedtime.     Marland Kitchen loratadine (CLARITIN) 10 MG tablet Take 10 mg by mouth daily as needed for allergies.    . metFORMIN (GLUCOPHAGE) 500 MG tablet Take by mouth 2 (two) times daily with a meal.    . propranolol (INDERAL) 10 MG tablet Take 10 mg by mouth 3 (three) times daily.    . SPRYCEL 100 MG tablet TAKE 1 TABLET BY MOUTH DAILY. 30 tablet 1  . traMADol (ULTRAM) 50 MG tablet Take 50 mg by mouth every 6 (six) hours as needed for moderate  pain.     . triamcinolone cream (KENALOG) 0.1 % Apply 1 application topically 2 (two) times daily. Reported on 06/06/2016    . triamterene-hydrochlorothiazide (DYAZIDE) 37.5-25 MG capsule Take 1 capsule by mouth daily.     No current facility-administered medications for this visit.    REVIEW OF SYSTEMS:   10 Point review of Systems was done is negative except as noted above.  PHYSICAL EXAMINATION: ECOG PERFORMANCE STATUS: 1 - Symptomatic but completely ambulatory  . Vitals:   02/15/21 1217  BP: 140/65  Pulse: 70  Resp: 18  Temp: (!) 97.5 F (36.4 C)  SpO2: 97%   Filed Weights   02/15/21 1217  Weight: 195 lb 14.4 oz (88.9 kg)   .Body mass index is 39.57 kg/m.    GENERAL:alert, in no acute distress and comfortable SKIN: no acute rashes, no significant lesions EYES: conjunctiva are pink and non-injected, sclera anicteric OROPHARYNX: MMM, no exudates, no oropharyngeal erythema or ulceration NECK: supple, no JVD LYMPH:  no palpable lymphadenopathy in the cervical, axillary or inguinal regions LUNGS: clear to auscultation b/l with normal respiratory effort HEART: regular rate & rhythm ABDOMEN:  normoactive bowel sounds , non tender, not distended. Extremity: no pedal edema PSYCH: alert & oriented x 3 with fluent speech NEURO: no focal motor/sensory deficits  LABORATORY DATA:   I have reviewed the data as listed  . CBC Latest Ref Rng & Units 02/15/2021 12/17/2020 11/28/2020  WBC 4.0 - 10.5 K/uL 12.4(H) 4.0 11.5(H)  Hemoglobin 12.0 - 15.0 g/dL 13.4 11.3(L) 10.4(L)  Hematocrit 36.0 - 46.0 % 40.9 34.9(L) 31.9(L)  Platelets 150 - 400 K/uL 204 154 443(H)    . CMP Latest Ref Rng & Units 02/15/2021 12/17/2020 11/28/2020  Glucose 70 - 99 mg/dL 189(H) 205(H) 191(H)  BUN 8 - 23 mg/dL 13 16 10   Creatinine 0.44 - 1.00 mg/dL 0.97 0.96 0.92  Sodium 135 - 145 mmol/L 139 137 140  Potassium 3.5 - 5.1 mmol/L 4.0 4.0 4.1  Chloride 98 - 111 mmol/L 103 104 104  CO2 22 - 32 mmol/L 25 25 27   Calcium 8.9 - 10.3 mg/dL 9.5 9.5 9.2  Total Protein 6.5 - 8.1 g/dL 7.3 7.0 6.7  Total Bilirubin 0.3 - 1.2 mg/dL 0.4 0.6 0.7  Alkaline Phos 38 - 126 U/L 83 102 117  AST 15 - 41 U/L 23 23 21   ALT 0 - 44 U/L 30 22 24     10/24/2020 Quantitative BCR/ABL:   10/24/2020 FISH - BCR/ABL:   10/24/2020 JAK2, MPL, & CALR sequencing:   10/24/2020 Flow Pathology Report [id number]):   RADIOGRAPHIC STUDIES: I have personally reviewed the radiological images as listed and agreed with the findings in the report. No results found.  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 Previous discussion about BM Bx led to the patient not wanting to engage in any treatment or f/u considerations.  PLAN: -Discussed pt labwork today, 02/15/2021; Hgb normalized, Plt normal, WBC and lymphocytes elevated, chemistries normal, uric acid normal. -Advised pt that her WBC elevation is due to lymphocytes and not related to her MPN. -Advised pt to wait six months prior to cataract surgery if not too bothersome to pt. The pt is agreeable to this. -The pt  has no  prohibitive toxicities from continuing 100 mg Sprycel daily at this time. Continue at this dosage at this time.   -Recommend pt increase water intake and drink at least 48-64 oz/daily. -Recommended pt walk around and avoid carbonated drinks when feeling gaseous. -Continue ASA, Lasix. -Will see back in 8 weeks. Will get labs 2 weeks prior.   FOLLOW UP: Labs in 6 weeks RTC with Dr Irene Limbo in 8 weeks   The total time spent in the appointment was 20 minutes and more than 50% was on counseling and direct patient cares.   All of the patient's questions were answered with apparent satisfaction. The patient knows to call the clinic with any problems, questions or concerns.    Sullivan Lone MD [place] [facility]IVMS [facility] Hematology/Oncology Physician [facility]  (Office):       [phone] (Work cell):  [phone] (Fax):           [phone]  02/15/2021 12:36 PM  I, Reinaldo Raddle, am acting as scribe for Dr. Sullivan Lone, MD.  .I have reviewed the above documentation for accuracy and completeness, and I agree with the above. Brunetta Genera MD

## 2021-02-15 ENCOUNTER — Inpatient Hospital Stay: Payer: BC Managed Care – PPO | Attending: Hematology | Admitting: Hematology

## 2021-02-15 ENCOUNTER — Inpatient Hospital Stay: Payer: BC Managed Care – PPO

## 2021-02-15 ENCOUNTER — Other Ambulatory Visit (HOSPITAL_COMMUNITY): Payer: Self-pay

## 2021-02-15 ENCOUNTER — Other Ambulatory Visit: Payer: Self-pay

## 2021-02-15 ENCOUNTER — Other Ambulatory Visit: Payer: Self-pay | Admitting: Hematology

## 2021-02-15 VITALS — BP 140/65 | HR 70 | Temp 97.5°F | Resp 18 | Ht 59.0 in | Wt 195.9 lb

## 2021-02-15 DIAGNOSIS — Z8249 Family history of ischemic heart disease and other diseases of the circulatory system: Secondary | ICD-10-CM | POA: Insufficient documentation

## 2021-02-15 DIAGNOSIS — Z79899 Other long term (current) drug therapy: Secondary | ICD-10-CM | POA: Insufficient documentation

## 2021-02-15 DIAGNOSIS — Z7982 Long term (current) use of aspirin: Secondary | ICD-10-CM | POA: Diagnosis not present

## 2021-02-15 DIAGNOSIS — F419 Anxiety disorder, unspecified: Secondary | ICD-10-CM | POA: Insufficient documentation

## 2021-02-15 DIAGNOSIS — I10 Essential (primary) hypertension: Secondary | ICD-10-CM | POA: Insufficient documentation

## 2021-02-15 DIAGNOSIS — Z803 Family history of malignant neoplasm of breast: Secondary | ICD-10-CM | POA: Insufficient documentation

## 2021-02-15 DIAGNOSIS — Z7984 Long term (current) use of oral hypoglycemic drugs: Secondary | ICD-10-CM | POA: Insufficient documentation

## 2021-02-15 DIAGNOSIS — C9210 Chronic myeloid leukemia, BCR/ABL-positive, not having achieved remission: Secondary | ICD-10-CM

## 2021-02-15 DIAGNOSIS — D72829 Elevated white blood cell count, unspecified: Secondary | ICD-10-CM | POA: Diagnosis not present

## 2021-02-15 DIAGNOSIS — E119 Type 2 diabetes mellitus without complications: Secondary | ICD-10-CM | POA: Insufficient documentation

## 2021-02-15 LAB — CMP (CANCER CENTER ONLY)
ALT: 30 U/L (ref 0–44)
AST: 23 U/L (ref 15–41)
Albumin: 4.2 g/dL (ref 3.5–5.0)
Alkaline Phosphatase: 83 U/L (ref 38–126)
Anion gap: 11 (ref 5–15)
BUN: 13 mg/dL (ref 8–23)
CO2: 25 mmol/L (ref 22–32)
Calcium: 9.5 mg/dL (ref 8.9–10.3)
Chloride: 103 mmol/L (ref 98–111)
Creatinine: 0.97 mg/dL (ref 0.44–1.00)
GFR, Estimated: >60 mL/min (ref >60)
Glucose, Bld: 189 mg/dL — ABNORMAL HIGH (ref 70–99)
Potassium: 4 mmol/L (ref 3.5–5.1)
Sodium: 139 mmol/L (ref 135–145)
Total Bilirubin: 0.4 mg/dL (ref 0.3–1.2)
Total Protein: 7.3 g/dL (ref 6.5–8.1)

## 2021-02-15 LAB — CBC WITH DIFFERENTIAL (CANCER CENTER ONLY)
Abs Immature Granulocytes: 0.03 10*3/uL (ref 0.00–0.07)
Basophils Absolute: 0.1 10*3/uL (ref 0.0–0.1)
Basophils Relative: 0 %
Eosinophils Absolute: 0.1 10*3/uL (ref 0.0–0.5)
Eosinophils Relative: 1 %
HCT: 40.9 % (ref 36.0–46.0)
Hemoglobin: 13.4 g/dL (ref 12.0–15.0)
Immature Granulocytes: 0 %
Lymphocytes Relative: 48 %
Lymphs Abs: 5.9 10*3/uL — ABNORMAL HIGH (ref 0.7–4.0)
MCH: 29.5 pg (ref 26.0–34.0)
MCHC: 32.8 g/dL (ref 30.0–36.0)
MCV: 90.1 fL (ref 80.0–100.0)
Monocytes Absolute: 0.9 10*3/uL (ref 0.1–1.0)
Monocytes Relative: 7 %
Neutro Abs: 5.5 10*3/uL (ref 1.7–7.7)
Neutrophils Relative %: 44 %
Platelet Count: 204 10*3/uL (ref 150–400)
RBC: 4.54 MIL/uL (ref 3.87–5.11)
RDW: 13.4 % (ref 11.5–15.5)
WBC Count: 12.4 10*3/uL — ABNORMAL HIGH (ref 4.0–10.5)
nRBC: 0 % (ref 0.0–0.2)

## 2021-02-15 LAB — SAMPLE TO BLOOD BANK

## 2021-02-15 LAB — URIC ACID: Uric Acid, Serum: 2.8 mg/dL (ref 2.5–7.1)

## 2021-02-15 LAB — LACTATE DEHYDROGENASE: LDH: 246 U/L — ABNORMAL HIGH (ref 98–192)

## 2021-02-15 MED ORDER — DASATINIB 100 MG PO TABS: 100.0000 mg | ORAL_TABLET | Freq: Every day | ORAL | 11 refills | Status: DC

## 2021-02-19 ENCOUNTER — Telehealth: Payer: Self-pay | Admitting: Hematology

## 2021-02-19 NOTE — Telephone Encounter (Signed)
Scheduled follow-up appointments per 3/25 los. Patient is aware.

## 2021-02-28 ENCOUNTER — Other Ambulatory Visit (HOSPITAL_COMMUNITY): Payer: Self-pay

## 2021-03-04 ENCOUNTER — Other Ambulatory Visit (HOSPITAL_COMMUNITY): Payer: Self-pay

## 2021-03-04 MED FILL — Dasatinib Tab 100 MG: ORAL | 30 days supply | Qty: 30 | Fill #0 | Status: AC

## 2021-03-06 ENCOUNTER — Other Ambulatory Visit (HOSPITAL_COMMUNITY): Payer: Self-pay

## 2021-03-11 ENCOUNTER — Ambulatory Visit: Payer: BC Managed Care – PPO | Admitting: Hematology

## 2021-03-11 ENCOUNTER — Other Ambulatory Visit: Payer: BC Managed Care – PPO

## 2021-03-22 ENCOUNTER — Telehealth: Payer: Self-pay

## 2021-03-22 NOTE — Telephone Encounter (Signed)
Returned call to pt regarding change of appointments. Pt given new appointment dates and times. Pt acknowledged.

## 2021-03-29 ENCOUNTER — Inpatient Hospital Stay: Payer: BC Managed Care – PPO

## 2021-04-01 ENCOUNTER — Other Ambulatory Visit (HOSPITAL_COMMUNITY): Payer: Self-pay

## 2021-04-01 MED FILL — Dasatinib Tab 100 MG: ORAL | 30 days supply | Qty: 30 | Fill #1 | Status: AC

## 2021-04-03 ENCOUNTER — Inpatient Hospital Stay: Payer: BC Managed Care – PPO | Attending: Hematology

## 2021-04-03 ENCOUNTER — Other Ambulatory Visit: Payer: Self-pay

## 2021-04-03 DIAGNOSIS — D72829 Elevated white blood cell count, unspecified: Secondary | ICD-10-CM | POA: Diagnosis present

## 2021-04-03 DIAGNOSIS — D75839 Thrombocytosis, unspecified: Secondary | ICD-10-CM | POA: Diagnosis not present

## 2021-04-03 DIAGNOSIS — C9210 Chronic myeloid leukemia, BCR/ABL-positive, not having achieved remission: Secondary | ICD-10-CM

## 2021-04-03 LAB — CBC WITH DIFFERENTIAL/PLATELET
Abs Immature Granulocytes: 0.01 10*3/uL (ref 0.00–0.07)
Basophils Absolute: 0 10*3/uL (ref 0.0–0.1)
Basophils Relative: 0 %
Eosinophils Absolute: 0.1 10*3/uL (ref 0.0–0.5)
Eosinophils Relative: 1 %
HCT: 40.1 % (ref 36.0–46.0)
Hemoglobin: 13.1 g/dL (ref 12.0–15.0)
Immature Granulocytes: 0 %
Lymphocytes Relative: 36 %
Lymphs Abs: 2.6 10*3/uL (ref 0.7–4.0)
MCH: 28.5 pg (ref 26.0–34.0)
MCHC: 32.7 g/dL (ref 30.0–36.0)
MCV: 87.4 fL (ref 80.0–100.0)
Monocytes Absolute: 0.5 10*3/uL (ref 0.1–1.0)
Monocytes Relative: 7 %
Neutro Abs: 4 10*3/uL (ref 1.7–7.7)
Neutrophils Relative %: 56 %
Platelets: 198 10*3/uL (ref 150–400)
RBC: 4.59 MIL/uL (ref 3.87–5.11)
RDW: 14.6 % (ref 11.5–15.5)
WBC: 7.2 10*3/uL (ref 4.0–10.5)
nRBC: 0 % (ref 0.0–0.2)

## 2021-04-03 LAB — CMP (CANCER CENTER ONLY)
ALT: 26 U/L (ref 0–44)
AST: 25 U/L (ref 15–41)
Albumin: 4 g/dL (ref 3.5–5.0)
Alkaline Phosphatase: 73 U/L (ref 38–126)
Anion gap: 9 (ref 5–15)
BUN: 15 mg/dL (ref 8–23)
CO2: 27 mmol/L (ref 22–32)
Calcium: 9.5 mg/dL (ref 8.9–10.3)
Chloride: 104 mmol/L (ref 98–111)
Creatinine: 0.85 mg/dL (ref 0.44–1.00)
GFR, Estimated: >60 mL/min (ref >60)
Glucose, Bld: 147 mg/dL — ABNORMAL HIGH (ref 70–99)
Potassium: 4 mmol/L (ref 3.5–5.1)
Sodium: 140 mmol/L (ref 135–145)
Total Bilirubin: 0.5 mg/dL (ref 0.3–1.2)
Total Protein: 6.9 g/dL (ref 6.5–8.1)

## 2021-04-03 LAB — LACTATE DEHYDROGENASE: LDH: 257 U/L — ABNORMAL HIGH (ref 98–192)

## 2021-04-04 ENCOUNTER — Other Ambulatory Visit (HOSPITAL_COMMUNITY): Payer: Self-pay

## 2021-04-08 ENCOUNTER — Other Ambulatory Visit (HOSPITAL_COMMUNITY): Payer: Self-pay

## 2021-04-12 ENCOUNTER — Ambulatory Visit: Payer: BC Managed Care – PPO | Admitting: Hematology

## 2021-04-16 NOTE — Progress Notes (Signed)
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 female who has been referred to [place] by Dr. Kenton Kingfisher for evaluation and management of leukocytosis and thrombocytosis. The pt reports that she is doing well overall.   The pt reports that her labs in January of 2021 were normal. Her most recent labs was the first time that she was told of her leukocytosis and thrombocytosis. Pt notes that she has been more fatigued over the last month. Pt hit her leg in August and has had consistent leg swelling since then. She is currently on Lasix for leg swelling. Pt does not feel that her Diabetes has been stable as she has been eating too many sugary foods and was told to work on weight loss. Her Optometrist saw something of concern during her eye exam, which prompted him to order the 10/12/20 labs. Pt does not think that she was diagnosed with Diabetic Retinopathy. She has Glaucoma and avoids steroid use for that reason. Pt notes that Penicilin and Sulfa drugs cause rash.  Most recent lab results (10/12/2020) of CBC is as follows: all values are WNL except for WBC at 42.1K, RDW at 15.7, PLT at 875K, Neutro Abs at 25.3K, Lymphs Abs at 6.3K, Mono Abs at 1.3K, Baso Abs at 2.1K, nRBC at 7, Glucose at 274.  On review of systems, pt reports fatigue, leg swelling and denies bone pain, abnormal/excessive bleeding, low appetite, unexpected weight loss, back pain, abdominal pain and any other symptoms.   On PMHx the pt reports Diabetes, D&C, Cataract Surgery, Glaucoma, HTN, HLD, Eczema. On Social Hx the pt reports that she is a non-smoker and does not drink much alcohol. Pt currently works in the [employer] office at [employer]. On Family Hx the pt reports  that her sister had Breast Cancer.  INTERVAL HISTORY:  Erica [name] is a wonderful 75 y.o. female who is here for evaluation and management of leukocytosis and thrombocytosis. The patient's last visit with [place] was on 02/15/2021. The pt reports that she is doing well overall.  The pt reports no new concerns or symptoms. She has continued to be eating and sleeping well. Her energy levels are improving. She is tolerating the continued Sprycel well. She recently got back from [place] visiting her son and had a great time.  Lab results 04/03/2021 of CBC w/diff and CMP is as follows: all values are WNL except for Glucose of 147. 04/03/2021 LDH of 257.  On review of systems, pt denies leg swelling, SOB, chest pain, abdominal pain, and any other symptoms.  MEDICAL HISTORY:  Past Medical History:  Diagnosis Date  . Bilateral swelling of feet   . Cataract   . Diabetes mellitus without complication (HCC)    diet controlled  . Eczema   . Elevated cholesterol   . GERD (gastroesophageal reflux disease)   . Glaucoma   . Hypertension   . Mitral valve prolapse   . 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 Spondylisthesis     SURGICAL HISTORY: Past Surgical History:  Procedure Laterality Date  . CESAREAN SECTION    . DILATATION & CURETTAGE/HYSTEROSCOPY WITH MYOSURE N/A 08/05/2016  Procedure: [place];  Surgeon: Anastasio Auerbach, MD;  Location: [facility];  Service: Gynecology;  Laterality: N/A;  . EXCISION OF SKIN TAG Left 08/05/2016   Procedure: EXCISION OF SKIN TAG;  Surgeon: Anastasio Auerbach, MD;  Location: [facility];  Service: Gynecology;  Laterality: Left;  . MOUTH SURGERY    . spinal tap      SOCIAL HISTORY: Social History   Socioeconomic History  . Marital status: Divorced    Spouse name: Not on file  .  Number of children: Not on file  . Years of education: Not on file  . Highest education level: Not on file  Occupational History  . Not on file  Tobacco Use  . Smoking status: Never Smoker  . Smokeless tobacco: Never Used  Vaping Use  . Vaping Use: Never used  Substance and Sexual Activity  . Alcohol use: No    Alcohol/week: 0.0 standard drinks  . Drug use: No  . Sexual activity: Not Currently    Birth control/protection: Post-menopausal    Comment: 1st intercourse 75 yo-Fewer than 5 partners  Other Topics Concern  . Not on file  Social History Narrative  . Not on file   Social Determinants of Health   Financial Resource Strain: Not on file  Food Insecurity: Not on file  Transportation Needs: Not on file  Physical Activity: Not on file  Stress: Not on file  Social Connections: Not on file  Intimate Partner Violence: Not on file    FAMILY HISTORY: Family History  Problem Relation Age of Onset  . Heart disease Mother   . Heart disease Father   . Breast cancer Sister 11    ALLERGIES:  is allergic to penicillins and sulfa antibiotics.  MEDICATIONS:  Current Outpatient Medications  Medication Sig Dispense Refill  . allopurinol (ZYLOPRIM) 100 MG tablet TAKE 1 TABLET BY MOUTH 2 TIMES DAILY 60 tablet 0  . aspirin EC 81 MG tablet Take 1 tablet (81 mg total) by mouth daily. Swallow whole. 30 tablet 1  . dasatinib (SPRYCEL) 100 MG tablet TAKE 1 TABLET (100 MG TOTAL) BY MOUTH DAILY. 30 tablet 11  . doxycycline (VIBRA-TABS) 100 MG tablet Take 100 mg by mouth 2 (two) times daily.    . furosemide (LASIX) 20 MG tablet Take 20 mg by mouth daily.    Marland Kitchen gabapentin (NEURONTIN) 100 MG capsule Take 100 mg by mouth 3 (three) times daily.    Marland Kitchen latanoprost (XALATAN) 0.005 % ophthalmic solution Place 1 drop into both eyes at bedtime.     Marland Kitchen loratadine (CLARITIN) 10 MG tablet Take 10 mg by mouth daily as needed for allergies.    . metFORMIN (GLUCOPHAGE) 500 MG tablet Take by mouth 2 (two)  times daily with a meal.    . propranolol (INDERAL) 10 MG tablet Take 10 mg by mouth 3 (three) times daily.    . traMADol (ULTRAM) 50 MG tablet Take 50 mg by mouth every 6 (six) hours as needed for moderate pain.     Marland Kitchen triamcinolone cream (KENALOG) 0.1 % Apply 1 application topically 2 (two) times daily. Reported on 06/06/2016    . triamterene-hydrochlorothiazide (DYAZIDE) 37.5-25 MG capsule Take 1 capsule by mouth daily.     No current facility-administered medications for this visit.    REVIEW OF SYSTEMS:   10 Point review of Systems was done is negative except as noted above.  PHYSICAL EXAMINATION: ECOG PERFORMANCE STATUS: 1 - Symptomatic but completely ambulatory  .  Vitals:   04/17/21 1117  BP: (!) 163/67  Pulse: 62  Resp: 18  Temp: 98.4 F (36.9 C)  SpO2: 100%   Filed Weights   04/17/21 1117  Weight: 195 lb 6.4 oz (88.6 kg)   .Body mass index is 39.47 kg/m.   NAD. GENERAL:alert, in no acute distress and comfortable SKIN: no acute rashes, no significant lesions EYES: conjunctiva are pink and non-injected, sclera anicteric OROPHARYNX: MMM, no exudates, no oropharyngeal erythema or ulceration NECK: supple, no JVD LYMPH:  no palpable lymphadenopathy in the cervical, axillary or inguinal regions LUNGS: clear to auscultation b/l with normal respiratory effort HEART: regular rate & rhythm ABDOMEN:  normoactive bowel sounds , non tender, not distended. Extremity: no pedal edema PSYCH: alert & oriented x 3 with fluent speech NEURO: no focal motor/sensory deficits  LABORATORY DATA:  I have reviewed the data as listed  . CBC Latest Ref Rng & Units 04/03/2021 02/15/2021 12/17/2020  WBC 4.0 - 10.5 K/uL 7.2 12.4(H) 4.0  Hemoglobin 12.0 - 15.0 g/dL 13.1 13.4 11.3(L)  Hematocrit 36.0 - 46.0 % 40.1 40.9 34.9(L)  Platelets 150 - 400 K/uL 198 204 154    . CMP Latest Ref Rng & Units 04/03/2021 02/15/2021 12/17/2020  Glucose 70 - 99 mg/dL 147(H) 189(H) 205(H)  BUN 8 - 23 mg/dL 15  13 16   Creatinine 0.44 - 1.00 mg/dL 0.85 0.97 0.96  Sodium 135 - 145 mmol/L 140 139 137  Potassium 3.5 - 5.1 mmol/L 4.0 4.0 4.0  Chloride 98 - 111 mmol/L 104 103 104  CO2 22 - 32 mmol/L 27 25 25   Calcium 8.9 - 10.3 mg/dL 9.5 9.5 9.5  Total Protein 6.5 - 8.1 g/dL 6.9 7.3 7.0  Total Bilirubin 0.3 - 1.2 mg/dL 0.5 0.4 0.6  Alkaline Phos 38 - 126 U/L 73 83 102  AST 15 - 41 U/L 25 23 23   ALT 0 - 44 U/L 26 30 22     10/24/2020 Quantitative BCR/ABL:   10/24/2020 FISH - BCR/ABL:   10/24/2020 JAK2, MPL, & CALR sequencing:   10/24/2020 Flow Pathology Report [id number]):   RADIOGRAPHIC STUDIES: I have personally reviewed the radiological images as listed and agreed with the findings in the report. No results found.  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 Previous discussion about BM Bx led to the patient not wanting to engage in any treatment or f/u considerations.  PLAN: -Discussed pt labwork, 04/03/2021; blood counts and chemistries normal, LDH improved.  -Advised pt we would like to draw one tube today to get the BCR ABL. The pt is agreeable to this -Advised pt that she is already in hematologic remission. We want to observe the level of molecular remission she is in given she has been on Sprycel for around six months (started in December). -The pt has no prohibitive toxicities from continuing 100 mg Sprycel daily at this time. Continue at this dosage at this time.   -Recommend pt increase water intake and drink at least 48-64 oz/daily. -Continue ASA, Lasix. -Will see back in 6 weeks with labs.   FOLLOW UP: Labs today RTC w Dr Irene Limbo in 6 weeks with labs   The total time spent in the appointment was 20  minutes and more than  50% was on counseling and direct patient cares.   All of the patient's questions were answered with apparent satisfaction. The patient knows to call the clinic with any problems, questions or concerns.    Sullivan Lone MD [place] A[facility]MS [facility] Hematology/Oncology Physician [facility]  (Office):       [phone] (Work cell):  [phone] (Fax):           [phone]  04/17/2021 11:47 AM  I, Reinaldo Raddle, am acting as scribe for Dr. Sullivan Lone, MD. .I have reviewed the above documentation for accuracy and completeness, and I agree with the above. Brunetta Genera MD

## 2021-04-17 ENCOUNTER — Telehealth: Payer: Self-pay | Admitting: Hematology

## 2021-04-17 ENCOUNTER — Inpatient Hospital Stay (HOSPITAL_BASED_OUTPATIENT_CLINIC_OR_DEPARTMENT_OTHER): Payer: BC Managed Care – PPO | Admitting: Hematology

## 2021-04-17 ENCOUNTER — Inpatient Hospital Stay: Payer: BC Managed Care – PPO

## 2021-04-17 ENCOUNTER — Other Ambulatory Visit: Payer: Self-pay | Admitting: Hematology

## 2021-04-17 ENCOUNTER — Other Ambulatory Visit: Payer: Self-pay

## 2021-04-17 VITALS — BP 163/67 | HR 62 | Temp 98.4°F | Resp 18 | Wt 195.4 lb

## 2021-04-17 DIAGNOSIS — D72829 Elevated white blood cell count, unspecified: Secondary | ICD-10-CM | POA: Diagnosis not present

## 2021-04-17 DIAGNOSIS — C9210 Chronic myeloid leukemia, BCR/ABL-positive, not having achieved remission: Secondary | ICD-10-CM

## 2021-04-17 NOTE — Telephone Encounter (Signed)
Scheduled follow-up appointments per 5/25 los. Patient is aware.

## 2021-04-25 LAB — BCR/ABL

## 2021-04-26 ENCOUNTER — Other Ambulatory Visit (HOSPITAL_COMMUNITY): Payer: Self-pay

## 2021-04-29 ENCOUNTER — Other Ambulatory Visit (HOSPITAL_COMMUNITY): Payer: Self-pay

## 2021-04-29 MED FILL — Dasatinib Tab 100 MG: ORAL | 30 days supply | Qty: 30 | Fill #2 | Status: AC

## 2021-05-03 ENCOUNTER — Other Ambulatory Visit (HOSPITAL_COMMUNITY): Payer: Self-pay

## 2021-05-16 ENCOUNTER — Ambulatory Visit: Payer: BC Managed Care – PPO | Admitting: Orthopedic Surgery

## 2021-05-28 ENCOUNTER — Other Ambulatory Visit (HOSPITAL_COMMUNITY): Payer: Self-pay

## 2021-05-30 ENCOUNTER — Ambulatory Visit: Payer: BC Managed Care – PPO | Admitting: Orthopedic Surgery

## 2021-05-30 ENCOUNTER — Other Ambulatory Visit (HOSPITAL_COMMUNITY): Payer: Self-pay

## 2021-05-30 MED FILL — Dasatinib Tab 100 MG: ORAL | 30 days supply | Qty: 30 | Fill #3 | Status: AC

## 2021-05-30 NOTE — Progress Notes (Signed)
HEMATOLOGY/ONCOLOGY CONSULTATION NOTE  Date of Service: 05/31/2021  Patient Care Team: Shirline Frees, MD as PCP - General (Family Medicine)  CHIEF COMPLAINTS/PURPOSE OF CONSULTATION:  F/u for mx of CML  HISTORY OF PRESENTING ILLNESS:   Erica [name] is a wonderful 75 y.o. female who has been referred to [place] by Dr. Kenton Kingfisher for evaluation and management of leukocytosis and thrombocytosis. The pt reports that she is doing well overall.   The pt reports that her labs in January of 2021 were normal. Her most recent labs was the first time that she was told of her leukocytosis and thrombocytosis. Pt notes that she has been more fatigued over the last month. Pt hit her leg in August and has had consistent leg swelling since then. She is currently on Lasix for leg swelling. Pt does not feel that her Diabetes has been stable as she has been eating too many sugary foods and was told to work on weight loss. Her Optometrist saw something of concern during her eye exam, which prompted him to order the 10/12/20 labs. Pt does not think that she was diagnosed with Diabetic Retinopathy. She has Glaucoma and avoids steroid use for that reason. Pt notes that Penicilin and Sulfa drugs cause rash.  Most recent lab results (10/12/2020) of CBC is as follows: all values are WNL except for WBC at 42.1K, RDW at 15.7, PLT at 875K, Neutro Abs at 25.3K, Lymphs Abs at 6.3K, Mono Abs at 1.3K, Baso Abs at 2.1K, nRBC at 7, Glucose at 274.  On review of systems, pt reports fatigue, leg swelling and denies bone pain, abnormal/excessive bleeding, low appetite, unexpected weight loss, back pain, abdominal pain and any other symptoms.   On PMHx the pt reports Diabetes, D&C, Cataract Surgery, Glaucoma, HTN, HLD, Eczema. On Social Hx the pt reports that she is a non-smoker and does not drink much alcohol. Pt currently works in the [employer] office at [employer]. On Family Hx the pt reports that her sister had Breast  Cancer.  INTERVAL HISTORY:   Erica [name] is a wonderful 75 y.o. female who is here for evaluation and management of leukocytosis and thrombocytosis. The patient's last visit with [place] was on 04/17/2021. The pt reports that she is doing well overall.  The pt reports that she has been doing well with no new symptoms or concerns. She notes some stress in her life still. She continues to work. She takes the Sprycel regularly with no missed doses or issues. She no longer takes the Allopurinol.    The patient notes she recently went to her eye doctor and he wanted to inject some dye in her to see what was wrong with it and why she could not fully see after the cataract surgery. She decided not to do this due to the effect it may have on her kidneys and vision.  Lab results today 05/31/2021 of CBC w/diff and CMP is as follows: all values are WNL. CMP pending.  On review of systems, pt reports stress and denies abdominal pain, leg swelling, back pain, and any other symptoms.  MEDICAL HISTORY:  Past Medical History:  Diagnosis Date   Bilateral swelling of feet    Cataract    Diabetes mellitus without complication (HCC)    diet controlled   Eczema    Elevated cholesterol    GERD (gastroesophageal reflux disease)    Glaucoma    Hypertension    Mitral valve prolapse    Simple endometrial hyperplasia  without atypia 07/2016   In endometrial polyp with surrounding endometrium inactive recommend follow up ultrasound for endometrial echo 1 year   Spondylisthesis     SURGICAL HISTORY: Past Surgical History:  Procedure Laterality Date   CESAREAN SECTION     DILATATION & CURETTAGE/HYSTEROSCOPY WITH MYOSURE N/A 08/05/2016   Procedure: DILATATION & CURETTAGE/HYSTEROSCOPY WITH MYOSURE;  Surgeon: Anastasio Auerbach, MD;  Location: [facility];  Service: Gynecology;  Laterality: N/A;   EXCISION OF SKIN TAG Left 08/05/2016   Procedure: EXCISION OF SKIN TAG;  Surgeon: Anastasio Auerbach, MD;  Location: [facility];  Service: Gynecology;  Laterality: Left;   MOUTH SURGERY     spinal tap      SOCIAL HISTORY: Social History   Socioeconomic History   Marital status: Divorced    Spouse name: Not on file   Number of children: Not on file   Years of education: Not on file   Highest education level: Not on file  Occupational History   Not on file  Tobacco Use   Smoking status: Never   Smokeless tobacco: Never  Vaping Use   Vaping Use: Never used  Substance and Sexual Activity   Alcohol use: No    Alcohol/week: 0.0 standard drinks   Drug use: No   Sexual activity: Not Currently    Birth control/protection: Post-menopausal    Comment: 1st intercourse 75 yo-Fewer than 5 partners  Other Topics Concern   Not on file  Social History Narrative   Not on file   Social Determinants of Health   Financial Resource Strain: Not on file  Food Insecurity: Not on file  Transportation Needs: Not on file  Physical Activity: Not on file  Stress: Not on file  Social Connections: Not on file  Intimate Partner Violence: Not on file    FAMILY HISTORY: Family History  Problem Relation Age of Onset   Heart disease Mother    Heart disease Father    Breast cancer Sister 85    ALLERGIES:  is allergic to penicillins and sulfa antibiotics.  MEDICATIONS:  Current Outpatient Medications  Medication Sig Dispense Refill   aspirin EC 81 MG tablet Take 1 tablet (81 mg total) by mouth daily. Swallow whole. 30 tablet 1   dasatinib (SPRYCEL) 100 MG tablet TAKE 1 TABLET (100 MG TOTAL) BY MOUTH DAILY. 30 tablet 11   furosemide (LASIX) 20 MG tablet Take 20 mg by mouth daily.     gabapentin (NEURONTIN) 100 MG capsule Take 100 mg by mouth 3 (three) times daily.     latanoprost (XALATAN) 0.005 % ophthalmic solution Place 1 drop into both eyes at bedtime.      loratadine (CLARITIN) 10 MG tablet Take 10 mg by mouth daily as needed for allergies.     metFORMIN (GLUCOPHAGE) 500 MG tablet Take by mouth 2 (two) times  daily with a meal.     propranolol (INDERAL) 10 MG tablet Take 10 mg by mouth 3 (three) times daily.     traMADol (ULTRAM) 50 MG tablet Take 50 mg by mouth every 6 (six) hours as needed for moderate pain.      triamcinolone cream (KENALOG) 0.1 % Apply 1 application topically 2 (two) times daily. Reported on 06/06/2016     triamterene-hydrochlorothiazide (DYAZIDE) 37.5-25 MG capsule Take 1 capsule by mouth daily.     No current facility-administered medications for this visit.    REVIEW OF SYSTEMS:   10 Point review of Systems was done is negative except  as noted above.  PHYSICAL EXAMINATION: ECOG PERFORMANCE STATUS: 1 - Symptomatic but completely ambulatory  . Vitals:   05/31/21 1019  BP: (!) 154/76  Pulse: 70  Resp: 18  Temp: 99.3 F (37.4 C)  SpO2: 100%    Filed Weights   05/31/21 1019  Weight: 190 lb 3.2 oz (86.3 kg)    .Body mass index is 38.42 kg/m.    GENERAL:alert, in no acute distress and comfortable SKIN: no acute rashes, no significant lesions EYES: conjunctiva are pink and non-injected, sclera anicteric OROPHARYNX: MMM, no exudates, no oropharyngeal erythema or ulceration NECK: supple, no JVD LYMPH:  no palpable lymphadenopathy in the cervical, axillary or inguinal regions LUNGS: clear to auscultation b/l with normal respiratory effort HEART: regular rate & rhythm ABDOMEN:  normoactive bowel sounds , non tender, not distended. Extremity: no pedal edema PSYCH: alert & oriented x 3 with fluent speech NEURO: no focal motor/sensory deficits  LABORATORY DATA:  I have reviewed the data as listed  . CBC Latest Ref Rng & Units 05/31/2021 04/03/2021 02/15/2021  WBC 4.0 - 10.5 K/uL 6.0 7.2 12.4(H)  Hemoglobin 12.0 - 15.0 g/dL 13.1 13.1 13.4  Hematocrit 36.0 - 46.0 % 39.8 40.1 40.9  Platelets 150 - 400 K/uL 187 198 204    . CMP Latest Ref Rng & Units 05/31/2021 04/03/2021 02/15/2021  Glucose 70 - 99 mg/dL 163(H) 147(H) 189(H)  BUN 8 - 23 mg/dL 14 15 13    Creatinine 0.44 - 1.00 mg/dL 0.92 0.85 0.97  Sodium 135 - 145 mmol/L 140 140 139  Potassium 3.5 - 5.1 mmol/L 3.9 4.0 4.0  Chloride 98 - 111 mmol/L 104 104 103  CO2 22 - 32 mmol/L 28 27 25   Calcium 8.9 - 10.3 mg/dL 9.8 9.5 9.5  Total Protein 6.5 - 8.1 g/dL 7.0 6.9 7.3  Total Bilirubin 0.3 - 1.2 mg/dL 0.7 0.5 0.4  Alkaline Phos 38 - 126 U/L 60 73 83  AST 15 - 41 U/L 21 25 23   ALT 0 - 44 U/L 23 26 30     10/24/2020 Quantitative BCR/ABL:   10/24/2020 FISH - BCR/ABL:   10/24/2020 JAK2, MPL, & CALR sequencing:   10/24/2020 Flow Pathology Report [id number]):   04/17/2021 BCR-ABL    RADIOGRAPHIC STUDIES: I have personally reviewed the radiological images as listed and agreed with the findings in the report. No results found.  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 Previous discussion about BM Bx led to the patient not wanting to engage in any treatment or f/u considerations.  PLAN: -Discussed pt labwork, 05/31/2021; blood counts all completely normal, chemistries pending. -Discussed 04/17/2021 BCR ABL-- already at less than 0.1% after three months and is in major molecular remission. The current goal is now to make it undetectable. This is exceptional response thus far. -Advised pt that from a CML standpoint, everything is very stable and under control. Acceptable to have cataract surgery at this time if needed. -Advised pt that if it becomes undetectable and remains so for two years, we can discuss stopping the Sprycel. -The pt has no prohibitive toxicities from continuing 100 mg Sprycel daily at this time. Continue at this dosage at this time.   -Recommend pt increase water intake and drink at least  48-64 oz/daily. -Recommended pt walk 20-30 minutes each day and stay active. -Advised pt to continue to monitor and limit her salt intake. -Continue ASA, Lasix. -Will see back in 2 months with labs 2 week prior.   FOLLOW UP: Labs in 6 weeks RTC with Dr Irene Limbo in 8 weeks   The total time spent in the appointment was 20 minutes and more than 50% was on counseling and direct patient cares.   All of the patient's questions were answered with apparent satisfaction. The patient knows to call the clinic with any problems, questions or concerns.    Sullivan Lone MD [place] AAHIVMS [facility] Hematology/Oncology Physician [facility]  (Office):       [phone] (Work cell):  [phone] (Fax):           [phone]  05/31/2021 10:44 AM  I, Reinaldo Raddle, am acting as scribe for Dr. Sullivan Lone, MD.  .I have reviewed the above documentation for accuracy and completeness, and I agree with the above. Brunetta Genera MD

## 2021-05-31 ENCOUNTER — Inpatient Hospital Stay: Payer: BC Managed Care – PPO | Attending: Hematology | Admitting: Hematology

## 2021-05-31 ENCOUNTER — Inpatient Hospital Stay: Payer: BC Managed Care – PPO

## 2021-05-31 ENCOUNTER — Other Ambulatory Visit: Payer: Self-pay

## 2021-05-31 VITALS — BP 154/76 | HR 70 | Temp 99.3°F | Resp 18 | Wt 190.2 lb

## 2021-05-31 DIAGNOSIS — C9210 Chronic myeloid leukemia, BCR/ABL-positive, not having achieved remission: Secondary | ICD-10-CM | POA: Diagnosis present

## 2021-05-31 LAB — CMP (CANCER CENTER ONLY)
ALT: 23 U/L (ref 0–44)
AST: 21 U/L (ref 15–41)
Albumin: 3.9 g/dL (ref 3.5–5.0)
Alkaline Phosphatase: 60 U/L (ref 38–126)
Anion gap: 8 (ref 5–15)
BUN: 14 mg/dL (ref 8–23)
CO2: 28 mmol/L (ref 22–32)
Calcium: 9.8 mg/dL (ref 8.9–10.3)
Chloride: 104 mmol/L (ref 98–111)
Creatinine: 0.92 mg/dL (ref 0.44–1.00)
GFR, Estimated: >60 mL/min (ref >60)
Glucose, Bld: 163 mg/dL — ABNORMAL HIGH (ref 70–99)
Potassium: 3.9 mmol/L (ref 3.5–5.1)
Sodium: 140 mmol/L (ref 135–145)
Total Bilirubin: 0.7 mg/dL (ref 0.3–1.2)
Total Protein: 7 g/dL (ref 6.5–8.1)

## 2021-05-31 LAB — CBC WITH DIFFERENTIAL/PLATELET
Abs Immature Granulocytes: 0 10*3/uL (ref 0.00–0.07)
Basophils Absolute: 0 10*3/uL (ref 0.0–0.1)
Basophils Relative: 0 %
Eosinophils Absolute: 0 10*3/uL (ref 0.0–0.5)
Eosinophils Relative: 1 %
HCT: 39.8 % (ref 36.0–46.0)
Hemoglobin: 13.1 g/dL (ref 12.0–15.0)
Immature Granulocytes: 0 %
Lymphocytes Relative: 31 %
Lymphs Abs: 1.9 10*3/uL (ref 0.7–4.0)
MCH: 28.9 pg (ref 26.0–34.0)
MCHC: 32.9 g/dL (ref 30.0–36.0)
MCV: 87.9 fL (ref 80.0–100.0)
Monocytes Absolute: 0.5 10*3/uL (ref 0.1–1.0)
Monocytes Relative: 8 %
Neutro Abs: 3.6 10*3/uL (ref 1.7–7.7)
Neutrophils Relative %: 60 %
Platelets: 187 10*3/uL (ref 150–400)
RBC: 4.53 MIL/uL (ref 3.87–5.11)
RDW: 14.3 % (ref 11.5–15.5)
WBC: 6 10*3/uL (ref 4.0–10.5)
nRBC: 0 % (ref 0.0–0.2)

## 2021-06-05 ENCOUNTER — Telehealth: Payer: Self-pay | Admitting: Hematology

## 2021-06-05 ENCOUNTER — Other Ambulatory Visit (HOSPITAL_COMMUNITY): Payer: Self-pay

## 2021-06-05 NOTE — Telephone Encounter (Signed)
Scheduled follow-up appointments per 7/8 los. Patient is aware.

## 2021-06-19 ENCOUNTER — Other Ambulatory Visit (HOSPITAL_COMMUNITY): Payer: Self-pay

## 2021-06-25 ENCOUNTER — Other Ambulatory Visit (HOSPITAL_COMMUNITY): Payer: Self-pay

## 2021-06-25 MED FILL — Dasatinib Tab 100 MG: ORAL | 30 days supply | Qty: 30 | Fill #4 | Status: CN

## 2021-07-05 ENCOUNTER — Telehealth: Payer: Self-pay

## 2021-07-05 ENCOUNTER — Other Ambulatory Visit (HOSPITAL_COMMUNITY): Payer: Self-pay

## 2021-07-05 MED FILL — Dasatinib Tab 100 MG: ORAL | 30 days supply | Qty: 30 | Fill #4 | Status: AC

## 2021-07-05 NOTE — Telephone Encounter (Signed)
Oral Oncology Patient Advocate Encounter   Received notification from [place] that prior authorization for Sprycel is required.   PA submitted on CoverMyMeds Key [id number] Status is pending   Oral Oncology Clinic will continue to follow.  [place] Patient [place] Phone [phone] Fax [phone] 07/05/2021 9:00 AM

## 2021-07-05 NOTE — Telephone Encounter (Signed)
Oral Oncology Patient Advocate Encounter  Prior Authorization for Sprycel has been approved.    PA# [id number] Effective dates: 07/05/21 through 07/05/22    Oral Oncology Clinic will continue to follow.   [place] Patient [place] Phone [phone] Fax [phone] 07/05/2021 11:22 AM

## 2021-07-12 ENCOUNTER — Other Ambulatory Visit: Payer: Self-pay

## 2021-07-12 ENCOUNTER — Inpatient Hospital Stay: Payer: BC Managed Care – PPO | Attending: Hematology

## 2021-07-12 DIAGNOSIS — C9210 Chronic myeloid leukemia, BCR/ABL-positive, not having achieved remission: Secondary | ICD-10-CM | POA: Insufficient documentation

## 2021-07-12 LAB — CMP (CANCER CENTER ONLY)
ALT: 21 U/L (ref 0–44)
AST: 22 U/L (ref 15–41)
Albumin: 4.1 g/dL (ref 3.5–5.0)
Alkaline Phosphatase: 58 U/L (ref 38–126)
Anion gap: 10 (ref 5–15)
BUN: 16 mg/dL (ref 8–23)
CO2: 25 mmol/L (ref 22–32)
Calcium: 9.7 mg/dL (ref 8.9–10.3)
Chloride: 105 mmol/L (ref 98–111)
Creatinine: 0.93 mg/dL (ref 0.44–1.00)
GFR, Estimated: >60 mL/min (ref >60)
Glucose, Bld: 146 mg/dL — ABNORMAL HIGH (ref 70–99)
Potassium: 4 mmol/L (ref 3.5–5.1)
Sodium: 140 mmol/L (ref 135–145)
Total Bilirubin: 0.5 mg/dL (ref 0.3–1.2)
Total Protein: 6.9 g/dL (ref 6.5–8.1)

## 2021-07-12 LAB — CBC WITH DIFFERENTIAL/PLATELET
Abs Immature Granulocytes: 0.02 10*3/uL (ref 0.00–0.07)
Basophils Absolute: 0 10*3/uL (ref 0.0–0.1)
Basophils Relative: 1 %
Eosinophils Absolute: 0 10*3/uL (ref 0.0–0.5)
Eosinophils Relative: 1 %
HCT: 40.3 % (ref 36.0–46.0)
Hemoglobin: 13.6 g/dL (ref 12.0–15.0)
Immature Granulocytes: 0 %
Lymphocytes Relative: 35 %
Lymphs Abs: 2.3 10*3/uL (ref 0.7–4.0)
MCH: 29.4 pg (ref 26.0–34.0)
MCHC: 33.7 g/dL (ref 30.0–36.0)
MCV: 87.2 fL (ref 80.0–100.0)
Monocytes Absolute: 0.5 10*3/uL (ref 0.1–1.0)
Monocytes Relative: 7 %
Neutro Abs: 3.7 10*3/uL (ref 1.7–7.7)
Neutrophils Relative %: 56 %
Platelets: 177 10*3/uL (ref 150–400)
RBC: 4.62 MIL/uL (ref 3.87–5.11)
RDW: 13.6 % (ref 11.5–15.5)
WBC: 6.5 10*3/uL (ref 4.0–10.5)
nRBC: 0 % (ref 0.0–0.2)

## 2021-07-18 LAB — BCR/ABL

## 2021-07-25 ENCOUNTER — Other Ambulatory Visit: Payer: Self-pay

## 2021-07-25 DIAGNOSIS — C9210 Chronic myeloid leukemia, BCR/ABL-positive, not having achieved remission: Secondary | ICD-10-CM

## 2021-07-26 ENCOUNTER — Other Ambulatory Visit (HOSPITAL_COMMUNITY): Payer: Self-pay

## 2021-07-26 ENCOUNTER — Inpatient Hospital Stay: Payer: BC Managed Care – PPO | Attending: Hematology | Admitting: Hematology

## 2021-07-26 ENCOUNTER — Other Ambulatory Visit: Payer: Self-pay

## 2021-07-26 VITALS — BP 146/68 | HR 76 | Temp 99.1°F | Resp 18 | Wt 189.7 lb

## 2021-07-26 DIAGNOSIS — C9210 Chronic myeloid leukemia, BCR/ABL-positive, not having achieved remission: Secondary | ICD-10-CM | POA: Insufficient documentation

## 2021-07-30 ENCOUNTER — Telehealth: Payer: Self-pay | Admitting: Hematology

## 2021-07-30 ENCOUNTER — Other Ambulatory Visit (HOSPITAL_COMMUNITY): Payer: Self-pay

## 2021-07-30 NOTE — Telephone Encounter (Signed)
Scheduled follow-up appointment per 9/2 los. Patient is aware. 

## 2021-08-01 NOTE — Progress Notes (Signed)
HEMATOLOGY/ONCOLOGY CLINIC NOTE  Date of Service: .07/26/2021   Patient Care Team: Shirline Frees, MD as PCP - General (Family Medicine)  CHIEF COMPLAINTS/PURPOSE OF CONSULTATION:  F/u for mx of CML  HISTORY OF PRESENTING ILLNESS:   Erica [name] is a wonderful 75 y.o. female who has been referred to [place] by Dr. Kenton Kingfisher for evaluation and management of leukocytosis and thrombocytosis. The pt reports that she is doing well overall.   The pt reports that her labs in January of 2021 were normal. Her most recent labs was the first time that she was told of her leukocytosis and thrombocytosis. Pt notes that she has been more fatigued over the last month. Pt hit her leg in August and has had consistent leg swelling since then. She is currently on Lasix for leg swelling. Pt does not feel that her Diabetes has been stable as she has been eating too many sugary foods and was told to work on weight loss. Her Optometrist saw something of concern during her eye exam, which prompted him to order the 10/12/20 labs. Pt does not think that she was diagnosed with Diabetic Retinopathy. She has Glaucoma and avoids steroid use for that reason. Pt notes that Penicilin and Sulfa drugs cause rash.  Most recent lab results (10/12/2020) of CBC is as follows: all values are WNL except for WBC at 42.1K, RDW at 15.7, PLT at 875K, Neutro Abs at 25.3K, Lymphs Abs at 6.3K, Mono Abs at 1.3K, Baso Abs at 2.1K, nRBC at 7, Glucose at 274.  On review of systems, pt reports fatigue, leg swelling and denies bone pain, abnormal/excessive bleeding, low appetite, unexpected weight loss, back pain, abdominal pain and any other symptoms.   On PMHx the pt reports Diabetes, D&C, Cataract Surgery, Glaucoma, HTN, HLD, Eczema. On Social Hx the pt reports that she is a non-smoker and does not drink much alcohol. Pt currently works in the [employer] office at [employer]. On Family Hx the pt reports that her sister had Breast  Cancer.  INTERVAL HISTORY:   Erica [name] is a wonderful 75 y.o. female who is here for evaluation and management of leukocytosis and thrombocytosis. The patient's last visit with [place] was on 05/31/2021. The pt reports that she is doing well overall.  The pt reports that she has been doing well with no new symptoms or concerns. She continues to work. She takes the Sprycel regularly with no missed doses or issues. She no longer takes the Allopurinol.    Lab results today 0 07/12/2021 of CBC w/diff within normal limits and CMP unremarkable other than a blood sugar of 163. BCR-ABL at 0.25%  On review of systems, pt reports stress and denies abdominal pain, leg swelling, back pain, and any other symptoms.  MEDICAL HISTORY:  Past Medical History:  Diagnosis Date   Bilateral swelling of feet    Cataract    Diabetes mellitus without complication (HCC)    diet controlled   Eczema    Elevated cholesterol    GERD (gastroesophageal reflux disease)    Glaucoma    Hypertension    Mitral valve prolapse    Simple endometrial hyperplasia without atypia 07/2016   In endometrial polyp with surrounding endometrium inactive recommend follow up ultrasound for endometrial echo 1 year   Spondylisthesis     SURGICAL HISTORY: Past Surgical History:  Procedure Laterality Date   [place] N/A 08/05/2016   Procedure: DILATATION &  CURETTAGE/HYSTEROSCOPY WITH MYOSURE;  Surgeon: Anastasio Auerbach, MD;  Location: [facility];  Service: Gynecology;  Laterality: N/A;   EXCISION OF SKIN TAG Left 08/05/2016   Procedure: EXCISION OF SKIN TAG;  Surgeon: Anastasio Auerbach, MD;  Location: [facility];  Service: Gynecology;  Laterality: Left;   MOUTH SURGERY     spinal tap      SOCIAL HISTORY: Social History   Socioeconomic History   Marital status: Divorced    Spouse name: Not on file   Number of children: Not on file   Years of education: Not on file    Highest education level: Not on file  Occupational History   Not on file  Tobacco Use   Smoking status: Never   Smokeless tobacco: Never  Vaping Use   Vaping Use: Never used  Substance and Sexual Activity   Alcohol use: No    Alcohol/week: 0.0 standard drinks   Drug use: No   Sexual activity: Not Currently    Birth control/protection: Post-menopausal    Comment: 1st intercourse 75 yo-Fewer than 5 partners  Other Topics Concern   Not on file  Social History Narrative   Not on file   Social Determinants of Health   Financial Resource Strain: Not on file  Food Insecurity: Not on file  Transportation Needs: Not on file  Physical Activity: Not on file  Stress: Not on file  Social Connections: Not on file  Intimate Partner Violence: Not on file    FAMILY HISTORY: Family History  Problem Relation Age of Onset   Heart disease Mother    Heart disease Father    Breast cancer Sister 37    ALLERGIES:  is allergic to penicillins and sulfa antibiotics.  MEDICATIONS:  Current Outpatient Medications  Medication Sig Dispense Refill   aspirin EC 81 MG tablet Take 1 tablet (81 mg total) by mouth daily. Swallow whole. 30 tablet 1   dasatinib (SPRYCEL) 100 MG tablet TAKE 1 TABLET (100 MG TOTAL) BY MOUTH DAILY. 30 tablet 11   furosemide (LASIX) 20 MG tablet Take 20 mg by mouth daily.     gabapentin (NEURONTIN) 100 MG capsule Take 100 mg by mouth 3 (three) times daily.     latanoprost (XALATAN) 0.005 % ophthalmic solution Place 1 drop into both eyes at bedtime.      loratadine (CLARITIN) 10 MG tablet Take 10 mg by mouth daily as needed for allergies.     metFORMIN (GLUCOPHAGE) 500 MG tablet Take by mouth 2 (two) times daily with a meal.     propranolol (INDERAL) 10 MG tablet Take 10 mg by mouth 3 (three) times daily.     traMADol (ULTRAM) 50 MG tablet Take 50 mg by mouth every 6 (six) hours as needed for moderate pain.      triamcinolone cream (KENALOG) 0.1 % Apply 1 application  topically 2 (two) times daily. Reported on 06/06/2016     triamterene-hydrochlorothiazide (DYAZIDE) 37.5-25 MG capsule Take 1 capsule by mouth daily.     No current facility-administered medications for this visit.    REVIEW OF SYSTEMS:   .10 Point review of Systems was done is negative except as noted above.   PHYSICAL EXAMINATION: ECOG PERFORMANCE STATUS: 1 - Symptomatic but completely ambulatory  . Vitals:   07/26/21 1128  BP: (!) 146/68  Pulse: 76  Resp: 18  Temp: 99.1 F (37.3 C)  SpO2: 99%    Filed Weights   07/26/21 1128  Weight: 189 lb 11.2 oz (  86 kg)    .Body mass index is 38.31 kg/m.   [place] GENERAL:alert, in no acute distress and comfortable SKIN: no acute rashes, no significant lesions EYES: conjunctiva are pink and non-injected, sclera anicteric OROPHARYNX: MMM, no exudates, no oropharyngeal erythema or ulceration NECK: supple, no JVD LYMPH:  no palpable lymphadenopathy in the cervical, axillary or inguinal regions LUNGS: clear to auscultation b/l with normal respiratory effort HEART: regular rate & rhythm ABDOMEN:  normoactive bowel sounds , non tender, not distended. Extremity: no pedal edema PSYCH: alert & oriented x 3 with fluent speech NEURO: no focal motor/sensory deficits   LABORATORY DATA:  I have reviewed the data as listed  . CBC Latest Ref Rng & Units 07/12/2021 05/31/2021 04/03/2021  WBC 4.0 - 10.5 K/uL 6.5 6.0 7.2  Hemoglobin 12.0 - 15.0 g/dL 13.6 13.1 13.1  Hematocrit 36.0 - 46.0 % 40.3 39.8 40.1  Platelets 150 - 400 K/uL 177 187 198    . CMP Latest Ref Rng & Units 07/12/2021 05/31/2021 04/03/2021  Glucose 70 - 99 mg/dL 146(H) 163(H) 147(H)  BUN 8 - 23 mg/dL '16 14 15  ' Creatinine 0.44 - 1.00 mg/dL 0.93 0.92 0.85  Sodium 135 - 145 mmol/L 140 140 140  Potassium 3.5 - 5.1 mmol/L 4.0 3.9 4.0  Chloride 98 - 111 mmol/L 105 104 104  CO2 22 - 32 mmol/L '25 28 27  ' Calcium 8.9 - 10.3 mg/dL 9.7 9.8 9.5  Total Protein 6.5 - 8.1 g/dL 6.9 7.0 6.9   Total Bilirubin 0.3 - 1.2 mg/dL 0.5 0.7 0.5  Alkaline Phos 38 - 126 U/L 58 60 73  AST 15 - 41 U/L '22 21 25  ' ALT 0 - 44 U/L '21 23 26    ' 10/24/2020 Quantitative BCR/ABL:   10/24/2020 FISH - BCR/ABL:   10/24/2020 JAK2, MPL, & CALR sequencing:   10/24/2020 Flow Pathology Report [id number]):   04/17/2021 BCR-ABL    RADIOGRAPHIC STUDIES: I have personally reviewed the radiological images as listed and agreed with the findings in the report. No results found.  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 Previous discussion about BM Bx led to the patient not wanting to engage in any treatment or f/u considerations.  PLAN: -Discussed pt labwork, 07/12/2021 blood counts all completely normal, chemistries pending. -Discussed 04/17/2021 BCR ABL-- already at less than 0.1% after three months and is in major molecular remission. The current goal is now to make it undetectable. This is exceptional response thus far. BCR ABL on 8/25-- show loss of MMR -- will need to continue to trend this.  Patient does note complete compliance with the medication and taking it appropriately.  No new acid suppressants or PPIs. -Advised pt that from a CML standpoint, everything is very stable and under control. Acceptable to have cataract surgery at this time if needed. -Recommend pt increase water intake and drink at least 48-64 oz/daily. -Recommended pt walk 20-30 minutes each day and stay active. -Advised pt to continue to monitor and limit her salt intake. -Continue ASA, Lasix.   FOLLOW UP: Labs in 8 weeks RTC with Dr Irene Limbo in 10 weeks  . The total time spent in the appointment was 21 minutes and more than 50% was on counseling  and  direct patient cares.   All of the patient's questions were answered with apparent satisfaction. The patient knows to call the clinic with any problems, questions or concerns.    Sullivan Lone MD [place] AAHIVMS [facility] Hematology/Oncology Physician [facility]  (Office):       [phone] (Work cell):  [phone] (Fax):           [phone]

## 2021-08-06 ENCOUNTER — Other Ambulatory Visit (HOSPITAL_COMMUNITY): Payer: Self-pay

## 2021-08-06 MED FILL — Dasatinib Tab 100 MG: ORAL | 30 days supply | Qty: 30 | Fill #5 | Status: AC

## 2021-08-29 ENCOUNTER — Other Ambulatory Visit (HOSPITAL_COMMUNITY): Payer: Self-pay

## 2021-08-29 MED FILL — Dasatinib Tab 100 MG: ORAL | 30 days supply | Qty: 30 | Fill #6 | Status: AC

## 2021-09-03 ENCOUNTER — Other Ambulatory Visit (HOSPITAL_COMMUNITY): Payer: Self-pay

## 2021-09-20 ENCOUNTER — Other Ambulatory Visit: Payer: Self-pay

## 2021-09-20 ENCOUNTER — Inpatient Hospital Stay: Payer: BC Managed Care – PPO | Attending: Hematology

## 2021-09-20 DIAGNOSIS — C9210 Chronic myeloid leukemia, BCR/ABL-positive, not having achieved remission: Secondary | ICD-10-CM | POA: Insufficient documentation

## 2021-09-20 LAB — CMP (CANCER CENTER ONLY)
ALT: 24 U/L (ref 0–44)
AST: 24 U/L (ref 15–41)
Albumin: 4.2 g/dL (ref 3.5–5.0)
Alkaline Phosphatase: 66 U/L (ref 38–126)
Anion gap: 10 (ref 5–15)
BUN: 19 mg/dL (ref 8–23)
CO2: 26 mmol/L (ref 22–32)
Calcium: 9.6 mg/dL (ref 8.9–10.3)
Chloride: 105 mmol/L (ref 98–111)
Creatinine: 0.9 mg/dL (ref 0.44–1.00)
GFR, Estimated: >60 mL/min (ref >60)
Glucose, Bld: 149 mg/dL — ABNORMAL HIGH (ref 70–99)
Potassium: 3.9 mmol/L (ref 3.5–5.1)
Sodium: 141 mmol/L (ref 135–145)
Total Bilirubin: 0.7 mg/dL (ref 0.3–1.2)
Total Protein: 7.4 g/dL (ref 6.5–8.1)

## 2021-09-20 LAB — CBC WITH DIFFERENTIAL (CANCER CENTER ONLY)
Abs Immature Granulocytes: 0.01 10*3/uL (ref 0.00–0.07)
Basophils Absolute: 0.1 10*3/uL (ref 0.0–0.1)
Basophils Relative: 1 %
Eosinophils Absolute: 0.1 10*3/uL (ref 0.0–0.5)
Eosinophils Relative: 1 %
HCT: 41 % (ref 36.0–46.0)
Hemoglobin: 13.2 g/dL (ref 12.0–15.0)
Immature Granulocytes: 0 %
Lymphocytes Relative: 44 %
Lymphs Abs: 2.4 10*3/uL (ref 0.7–4.0)
MCH: 28.2 pg (ref 26.0–34.0)
MCHC: 32.2 g/dL (ref 30.0–36.0)
MCV: 87.6 fL (ref 80.0–100.0)
Monocytes Absolute: 0.5 10*3/uL (ref 0.1–1.0)
Monocytes Relative: 9 %
Neutro Abs: 2.4 10*3/uL (ref 1.7–7.7)
Neutrophils Relative %: 45 %
Platelet Count: 174 10*3/uL (ref 150–400)
RBC: 4.68 MIL/uL (ref 3.87–5.11)
RDW: 13.6 % (ref 11.5–15.5)
WBC Count: 5.4 10*3/uL (ref 4.0–10.5)
nRBC: 0 % (ref 0.0–0.2)

## 2021-09-26 ENCOUNTER — Other Ambulatory Visit (HOSPITAL_COMMUNITY): Payer: Self-pay

## 2021-09-26 MED FILL — Dasatinib Tab 100 MG: ORAL | 30 days supply | Qty: 30 | Fill #7 | Status: AC

## 2021-10-01 ENCOUNTER — Other Ambulatory Visit (HOSPITAL_COMMUNITY): Payer: Self-pay

## 2021-10-04 ENCOUNTER — Inpatient Hospital Stay: Payer: BC Managed Care – PPO | Attending: Hematology | Admitting: Hematology

## 2021-10-04 ENCOUNTER — Other Ambulatory Visit: Payer: Self-pay

## 2021-10-04 ENCOUNTER — Inpatient Hospital Stay: Payer: BC Managed Care – PPO

## 2021-10-04 VITALS — BP 171/66 | HR 66 | Temp 97.5°F | Resp 18 | Wt 191.6 lb

## 2021-10-04 DIAGNOSIS — C9210 Chronic myeloid leukemia, BCR/ABL-positive, not having achieved remission: Secondary | ICD-10-CM

## 2021-10-11 LAB — BCR/ABL

## 2021-10-14 NOTE — Progress Notes (Signed)
HEMATOLOGY/ONCOLOGY CLINIC NOTE  Date of Service: .10/04/2021   Patient Care Team: Shirline Frees, MD as PCP - General (Family Medicine)  CHIEF COMPLAINTS/PURPOSE OF CONSULTATION:  F/u for mx of CML  HISTORY OF PRESENTING ILLNESS:   Erica [name] is a wonderful 75 y.o. female who has been referred to [place] by Dr. Kenton Kingfisher for evaluation and management of leukocytosis and thrombocytosis. The pt reports that she is doing well overall.   The pt reports that her labs in January of 2021 were normal. Her most recent labs was the first time that she was told of her leukocytosis and thrombocytosis. Pt notes that she has been more fatigued over the last month. Pt hit her leg in August and has had consistent leg swelling since then. She is currently on Lasix for leg swelling. Pt does not feel that her Diabetes has been stable as she has been eating too many sugary foods and was told to work on weight loss. Her Optometrist saw something of concern during her eye exam, which prompted him to order the 10/12/20 labs. Pt does not think that she was diagnosed with Diabetic Retinopathy. She has Glaucoma and avoids steroid use for that reason. Pt notes that Penicilin and Sulfa drugs cause rash.  Most recent lab results (10/12/2020) of CBC is as follows: all values are WNL except for WBC at 42.1K, RDW at 15.7, PLT at 875K, Neutro Abs at 25.3K, Lymphs Abs at 6.3K, Mono Abs at 1.3K, Baso Abs at 2.1K, nRBC at 7, Glucose at 274.  On review of systems, pt reports fatigue, leg swelling and denies bone pain, abnormal/excessive bleeding, low appetite, unexpected weight loss, back pain, abdominal pain and any other symptoms.   On PMHx the pt reports Diabetes, D&C, Cataract Surgery, Glaucoma, HTN, HLD, Eczema. On Social Hx the pt reports that she is a non-smoker and does not drink much alcohol. Pt currently works in the [employer] office at [employer]. On Family Hx the pt reports that her sister had Breast  Cancer.  INTERVAL HISTORY:   Erica [name] is a wonderful 75 y.o. female who is here for evaluation and management of CML. The patient's last visit with [place] was on 07/26/2021. The pt reports that she is doing well overall.  The pt reports that she has been doing well with no new symptoms or concerns. She continues to work. She takes the Sprycel regularly with no missed doses or issues.   Lab results today 10/04/2021 of CBC w/diff within normal limits and CMP unremarkable other than a blood sugar of 163. BCR-ABL pending.  On review of systems, pt reports stress and denies abdominal pain, leg swelling, back pain, and any other symptoms.  MEDICAL HISTORY:  Past Medical History:  Diagnosis Date   Bilateral swelling of feet    Cataract    Diabetes mellitus without complication (HCC)    diet controlled   Eczema    Elevated cholesterol    GERD (gastroesophageal reflux disease)    Glaucoma    Hypertension    Mitral valve prolapse    Simple endometrial hyperplasia without atypia 07/2016   In endometrial polyp with surrounding endometrium inactive recommend follow up ultrasound for endometrial echo 1 year   Spondylisthesis     SURGICAL HISTORY: Past Surgical History:  Procedure Laterality Date   CESAREAN SECTION     DILATATION & CURETTAGE/HYSTEROSCOPY WITH MYOSURE N/A 08/05/2016   Procedure: [place];  Surgeon: Anastasio Auerbach, MD;  Location:  [facility];  Service: Gynecology;  Laterality: N/A;   EXCISION OF SKIN TAG Left 08/05/2016   Procedure: EXCISION OF SKIN TAG;  Surgeon: Anastasio Auerbach, MD;  Location: [facility];  Service: Gynecology;  Laterality: Left;   MOUTH SURGERY     spinal tap      SOCIAL HISTORY: Social History   Socioeconomic History   Marital status: Divorced    Spouse name: Not on file   Number of children: Not on file   Years of education: Not on file   Highest education level: Not on file  Occupational History   Not on  file  Tobacco Use   Smoking status: Never   Smokeless tobacco: Never  Vaping Use   Vaping Use: Never used  Substance and Sexual Activity   Alcohol use: No    Alcohol/week: 0.0 standard drinks   Drug use: No   Sexual activity: Not Currently    Birth control/protection: Post-menopausal    Comment: 1st intercourse 75 yo-Fewer than 5 partners  Other Topics Concern   Not on file  Social History Narrative   Not on file   Social Determinants of Health   Financial Resource Strain: Not on file  Food Insecurity: Not on file  Transportation Needs: Not on file  Physical Activity: Not on file  Stress: Not on file  Social Connections: Not on file  Intimate Partner Violence: Not on file    FAMILY HISTORY: Family History  Problem Relation Age of Onset   Heart disease Mother    Heart disease Father    Breast cancer Sister 2    ALLERGIES:  is allergic to penicillins and sulfa antibiotics.  MEDICATIONS:  Current Outpatient Medications  Medication Sig Dispense Refill   aspirin EC 81 MG tablet Take 1 tablet (81 mg total) by mouth daily. Swallow whole. 30 tablet 1   dasatinib (SPRYCEL) 100 MG tablet TAKE 1 TABLET (100 MG TOTAL) BY MOUTH DAILY. 30 tablet 11   furosemide (LASIX) 20 MG tablet Take 20 mg by mouth daily.     gabapentin (NEURONTIN) 100 MG capsule Take 100 mg by mouth 3 (three) times daily.     latanoprost (XALATAN) 0.005 % ophthalmic solution Place 1 drop into both eyes at bedtime.      loratadine (CLARITIN) 10 MG tablet Take 10 mg by mouth daily as needed for allergies.     metFORMIN (GLUCOPHAGE) 500 MG tablet Take by mouth 2 (two) times daily with a meal.     propranolol (INDERAL) 10 MG tablet Take 10 mg by mouth 3 (three) times daily.     traMADol (ULTRAM) 50 MG tablet Take 50 mg by mouth every 6 (six) hours as needed for moderate pain.      triamcinolone cream (KENALOG) 0.1 % Apply 1 application topically 2 (two) times daily. Reported on 06/06/2016      triamterene-hydrochlorothiazide (DYAZIDE) 37.5-25 MG capsule Take 1 capsule by mouth daily.     No current facility-administered medications for this visit.    REVIEW OF SYSTEMS:   .10 Point review of Systems was done is negative except as noted above.  PHYSICAL EXAMINATION: ECOG PERFORMANCE STATUS: 1 - Symptomatic but completely ambulatory  . Vitals:   10/04/21 1111  BP: (!) 171/66  Pulse: 66  Resp: 18  Temp: (!) 97.5 F (36.4 C)  SpO2: 98%    Filed Weights   10/04/21 1111  Weight: 191 lb 9 oz (86.9 kg)    .Body mass index is 38.69 kg/m.   Marland Kitchen  GENERAL:alert, in no acute distress and comfortable SKIN: no acute rashes, no significant lesions EYES: conjunctiva are pink and non-injected, sclera anicteric OROPHARYNX: MMM, no exudates, no oropharyngeal erythema or ulceration NECK: supple, no JVD LYMPH:  no palpable lymphadenopathy in the cervical, axillary or inguinal regions LUNGS: clear to auscultation b/l with normal respiratory effort HEART: regular rate & rhythm ABDOMEN:  normoactive bowel sounds , non tender, not distended. Extremity: no pedal edema PSYCH: alert & oriented x 3 with fluent speech NEURO: no focal motor/sensory deficits   LABORATORY DATA:  I have reviewed the data as listed  . CBC Latest Ref Rng & Units 09/20/2021 07/12/2021 05/31/2021  WBC 4.0 - 10.5 K/uL 5.4 6.5 6.0  Hemoglobin 12.0 - 15.0 g/dL 13.2 13.6 13.1  Hematocrit 36.0 - 46.0 % 41.0 40.3 39.8  Platelets 150 - 400 K/uL 174 177 187    . CMP Latest Ref Rng & Units 09/20/2021 07/12/2021 05/31/2021  Glucose 70 - 99 mg/dL 149(H) 146(H) 163(H)  BUN 8 - 23 mg/dL _0 Creatinine 0.44 - 1.00 mg/dL 0.90 0.93 0.92  Sodium 135 - 145 mmol/L 141 140 140  Potassium 3.5 - 5.1 mmol/L 3.9 4.0 3.9  Chloride 98 - 111 mmol/L 105 105 104  CO2 22 - 32 mmol/L _1 Calcium 8.9 - 10.3 mg/dL 9.6 9.7 9.8  Total Protein 6.5 - 8.1 g/dL 7.4 6.9 7.0  Total Bilirubin 0.3 - 1.2 mg/dL 0.7 0.5 0.7  Alkaline  Phos 38 - 126 U/L 66 58 60  AST 15 - 41 U/L _2 ALT 0 - 44 U/L _3 10/24/2020 Quantitative BCR/ABL:   10/24/2020 FISH - BCR/ABL:   10/24/2020 JAK2, MPL, & CALR sequencing:   10/24/2020 Flow Pathology Report [id number]):   04/17/2021 BCR-ABL    RADIOGRAPHIC STUDIES: I have personally reviewed the radiological images as listed and agreed with the findings in the report. No results found.  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 Previous discussion about BM Bx led to the patient not wanting to engage in any treatment or f/u considerations.  PLAN: -Discussed pt labwork, 10/04/2021 blood counts all completely normal, chemistries wnl. -rpt BCR ABL pending -BCR abl kinase domain mutation testing sent out since the patient's transcript levels were increasing.  To evaluate for treatment resistance. BCR ABL on 8/25-- show loss of MMR -- will need to continue to trend this.  Patient does note complete compliance with the medication and taking it appropriately.  No new acid suppressants or PPIs. -Recommend pt increase water intake and drink at least 48-64 oz/daily. -Recommended pt walk 20-30 minutes each day and stay active. -Advised pt to continue to monitor and limit her salt intake. -Continue ASA, Lasix.   FOLLOW UP: Additional labs today RTC with Dr Irene Limbo in 10 weeks Labs in 8 weeks  . The total time spent in the appointment was 23 minutes and more than 50% was on counseling and direct patient cares.    All of the patient's questions were answered with apparent satisfaction. The patient knows to call the clinic with any problems, questions or concerns.    Sullivan Lone MD [place] [facility]IVMS [facility]  [facility] Hematology/Oncology Physician [place]  [place]

## 2021-10-21 ENCOUNTER — Other Ambulatory Visit (HOSPITAL_COMMUNITY): Payer: Self-pay

## 2021-10-24 ENCOUNTER — Encounter: Payer: Self-pay | Admitting: Orthopedic Surgery

## 2021-10-24 ENCOUNTER — Ambulatory Visit (INDEPENDENT_AMBULATORY_CARE_PROVIDER_SITE_OTHER): Payer: BC Managed Care – PPO | Admitting: Orthopedic Surgery

## 2021-10-24 DIAGNOSIS — I878 Other specified disorders of veins: Secondary | ICD-10-CM

## 2021-10-24 NOTE — Progress Notes (Signed)
Office Visit Note   Patient: Erica [name]           Date of Birth: 11/05/1946           MRN: [id number] Visit Date: 10/24/2021              Requested by: Shirline Frees, MD [address],  [place] [address] PCP: Shirline Frees, MD  Chief Complaint  Patient presents with   Right Foot - Wound Check    Ulcer on 4th webspace and PIP joint 4th and 5th toe      HPI: Patient is a 75 year old woman who presents in follow-up for both lower extremities she complains of painful corns over the PIP joint of the little toe bilaterally and painful venous swelling in the right leg.  Patient states she is on chemotherapy.  Assessment & Plan: Visit Diagnoses:  1. Venous stasis of both lower extremities     Plan: Patient was given a prescription for extra-large knee-high compression stockings.  Calluses were pared on both feet.  Follow-Up Instructions: Return in about 3 months (around 01/22/2022).   Ortho Exam  Patient is alert, oriented, no adenopathy, well-dressed, normal affect, normal respiratory effort. Examination patient has hypertrophic calluses over the PIP joint of the little toe bilaterally.  After informed consent a 10 blade knife was used to pare the calluses.  There is no abscess no ulceration no signs of infection.  Patient does have tender to palpation varicose veins worse on the right than the left mid calf laterally.  No evidence of a DVT.  No pain with dorsiflexion of the ankle.  Imaging: No results found. No images are attached to the encounter.  Labs: Lab Results  Component Value Date   LABURIC 2.8 02/15/2021   LABURIC 2.6 12/17/2020   LABURIC 2.0 (L) 11/28/2020     Lab Results  Component Value Date   ALBUMIN 4.2 09/20/2021   ALBUMIN 4.1 07/12/2021   ALBUMIN 3.9 05/31/2021    No results found for: MG No results found for: VD25OH  No results found for: PREALBUMIN CBC EXTENDED Latest Ref Rng & Units 09/20/2021 07/12/2021 05/31/2021   WBC 4.0 - 10.5 K/uL 5.4 6.5 6.0  RBC 3.87 - 5.11 MIL/uL 4.68 4.62 4.53  HGB 12.0 - 15.0 g/dL 13.2 13.6 13.1  HCT 36.0 - 46.0 % 41.0 40.3 39.8  PLT 150 - 400 K/uL 174 177 187  NEUTROABS 1.7 - 7.7 K/uL 2.4 3.7 3.6  LYMPHSABS 0.7 - 4.0 K/uL 2.4 2.3 1.9     There is no height or weight on file to calculate BMI.  Orders:  No orders of the defined types were placed in this encounter.  No orders of the defined types were placed in this encounter.    Procedures: No procedures performed  Clinical Data: No additional findings.  ROS:  All other systems negative, except as noted in the HPI. Review of Systems  Objective: Vital Signs: There were no vitals taken for this visit.  Specialty Comments:  No specialty comments available.  PMFS History: There are no problems to display for this patient.  Past Medical History:  Diagnosis Date   Bilateral swelling of feet    Cataract    Diabetes mellitus without complication (HCC)    diet controlled   Eczema    Elevated cholesterol    GERD (gastroesophageal reflux disease)    Glaucoma    Hypertension    Mitral valve prolapse  Simple endometrial hyperplasia without atypia 07/2016   In endometrial polyp with surrounding endometrium inactive recommend follow up ultrasound for endometrial echo 1 year   Spondylisthesis     Family History  Problem Relation Age of Onset   Heart disease Mother    Heart disease Father    Breast cancer Sister 73    Past Surgical History:  Procedure Laterality Date   CESAREAN SECTION     DILATATION & CURETTAGE/HYSTEROSCOPY WITH MYOSURE N/A 08/05/2016   Procedure: DILATATION & CURETTAGE/HYSTEROSCOPY WITH MYOSURE;  Surgeon: Anastasio Auerbach, MD;  Location: [facility];  Service: Gynecology;  Laterality: N/A;   EXCISION OF SKIN TAG Left 08/05/2016   Procedure: EXCISION OF SKIN TAG;  Surgeon: Anastasio Auerbach, MD;  Location: [facility];  Service: Gynecology;  Laterality: Left;   MOUTH SURGERY     spinal tap      Social History   Occupational History   Not on file  Tobacco Use   Smoking status: Never   Smokeless tobacco: Never  Vaping Use   Vaping Use: Never used  Substance and Sexual Activity   Alcohol use: No    Alcohol/week: 0.0 standard drinks   Drug use: No   Sexual activity: Not Currently    Birth control/protection: Post-menopausal    Comment: 1st intercourse 75 yo-Fewer than 5 partners

## 2021-10-25 ENCOUNTER — Encounter (HOSPITAL_COMMUNITY): Payer: Self-pay | Admitting: Emergency Medicine

## 2021-10-25 ENCOUNTER — Emergency Department (HOSPITAL_COMMUNITY)
Admission: EM | Admit: 2021-10-25 | Discharge: 2021-10-26 | Disposition: A | Discharge status: Home / Self Care | Payer: BC Managed Care – PPO | Attending: Emergency Medicine | Admitting: Emergency Medicine

## 2021-10-25 ENCOUNTER — Encounter (HOSPITAL_COMMUNITY): Payer: Self-pay

## 2021-10-25 ENCOUNTER — Ambulatory Visit (HOSPITAL_COMMUNITY)
Admission: EM | Admit: 2021-10-25 | Discharge: 2021-10-25 | Disposition: A | Discharge status: Home / Self Care | Payer: BC Managed Care – PPO

## 2021-10-25 ENCOUNTER — Other Ambulatory Visit: Payer: Self-pay

## 2021-10-25 ENCOUNTER — Emergency Department (HOSPITAL_COMMUNITY): Payer: BC Managed Care – PPO

## 2021-10-25 DIAGNOSIS — E119 Type 2 diabetes mellitus without complications: Secondary | ICD-10-CM | POA: Insufficient documentation

## 2021-10-25 DIAGNOSIS — Z79899 Other long term (current) drug therapy: Secondary | ICD-10-CM | POA: Diagnosis not present

## 2021-10-25 DIAGNOSIS — R509 Fever, unspecified: Secondary | ICD-10-CM

## 2021-10-25 DIAGNOSIS — Z859 Personal history of malignant neoplasm, unspecified: Secondary | ICD-10-CM | POA: Insufficient documentation

## 2021-10-25 DIAGNOSIS — C9210 Chronic myeloid leukemia, BCR/ABL-positive, not having achieved remission: Secondary | ICD-10-CM | POA: Diagnosis not present

## 2021-10-25 DIAGNOSIS — Z7984 Long term (current) use of oral hypoglycemic drugs: Secondary | ICD-10-CM | POA: Diagnosis not present

## 2021-10-25 DIAGNOSIS — U071 COVID-19: Secondary | ICD-10-CM | POA: Diagnosis not present

## 2021-10-25 DIAGNOSIS — I10 Essential (primary) hypertension: Secondary | ICD-10-CM | POA: Insufficient documentation

## 2021-10-25 DIAGNOSIS — Z7982 Long term (current) use of aspirin: Secondary | ICD-10-CM | POA: Diagnosis not present

## 2021-10-25 HISTORY — DX: Malignant (primary) neoplasm, unspecified: C80.1

## 2021-10-25 LAB — COMPREHENSIVE METABOLIC PANEL
ALT: 27 U/L (ref 0–44)
AST: 34 U/L (ref 15–41)
Albumin: 4.5 g/dL (ref 3.5–5.0)
Alkaline Phosphatase: 49 U/L (ref 38–126)
Anion gap: 10 (ref 5–15)
BUN: 11 mg/dL (ref 8–23)
CO2: 26 mmol/L (ref 22–32)
Calcium: 8.8 mg/dL — ABNORMAL LOW (ref 8.9–10.3)
Chloride: 96 mmol/L — ABNORMAL LOW (ref 98–111)
Creatinine, Ser: 0.92 mg/dL (ref 0.44–1.00)
GFR, Estimated: >60 mL/min (ref >60)
Glucose, Bld: 156 mg/dL — ABNORMAL HIGH (ref 70–99)
Potassium: 3.2 mmol/L — ABNORMAL LOW (ref 3.5–5.1)
Sodium: 132 mmol/L — ABNORMAL LOW (ref 135–145)
Total Bilirubin: 0.8 mg/dL (ref 0.3–1.2)
Total Protein: 7.5 g/dL (ref 6.5–8.1)

## 2021-10-25 LAB — CBC
HCT: 42.4 % (ref 36.0–46.0)
Hemoglobin: 13.7 g/dL (ref 12.0–15.0)
MCH: 28.8 pg (ref 26.0–34.0)
MCHC: 32.3 g/dL (ref 30.0–36.0)
MCV: 89.3 fL (ref 80.0–100.0)
Platelets: 152 10*3/uL (ref 150–400)
RBC: 4.75 MIL/uL (ref 3.87–5.11)
RDW: 13.8 % (ref 11.5–15.5)
WBC: 10 10*3/uL (ref 4.0–10.5)
nRBC: 0 % (ref 0.0–0.2)

## 2021-10-25 MED ORDER — POTASSIUM CHLORIDE CRYS ER 20 MEQ PO TBCR
20.0000 meq | EXTENDED_RELEASE_TABLET | Freq: Once | ORAL | Status: AC
  Administered 2021-10-26: 20 meq via ORAL
  Filled 2021-10-25: qty 1

## 2021-10-25 NOTE — ED Provider Notes (Signed)
[facility] DEPT Provider Note   CSN: [id number] Arrival date & time: 10/25/21  1934     History Chief Complaint  Patient presents with   Fever   Nasal Congestion    Erica [name] is a 75 y.o. female.  Patient complains of cough congestion fever.  She did not get her flu shot.  The history is provided by the patient and medical records. No language interpreter was used.  Fever Max temp prior to arrival:  100.5 Temp source:  Oral Severity:  Moderate Onset quality:  Sudden Timing:  Constant Progression:  Waxing and waning Chronicity:  New Relieved by:  Nothing Worsened by:  Nothing Ineffective treatments:  None tried Associated symptoms: cough   Associated symptoms: no chest pain, no congestion, no diarrhea, no headaches and no rash   Risk factors: no contaminated food       Past Medical History:  Diagnosis Date   Bilateral swelling of feet    Cancer (HCC)    Cataract    Diabetes mellitus without complication (HCC)    diet controlled   Eczema    Elevated cholesterol    GERD (gastroesophageal reflux disease)    Glaucoma    Hypertension    Mitral valve prolapse    Simple endometrial hyperplasia without atypia 07/2016   In endometrial polyp with surrounding endometrium inactive recommend follow up ultrasound for endometrial echo 1 year   Spondylisthesis     There are no problems to display for this patient.   Past Surgical History:  Procedure Laterality Date   CESAREAN SECTION     DILATATION & CURETTAGE/HYSTEROSCOPY WITH MYOSURE N/A 08/05/2016   Procedure: DILATATION & CURETTAGE/HYSTEROSCOPY WITH MYOSURE;  Surgeon: Anastasio Auerbach, MD;  Location: [facility];  Service: Gynecology;  Laterality: N/A;   EXCISION OF SKIN TAG Left 08/05/2016   Procedure: EXCISION OF SKIN TAG;  Surgeon: Anastasio Auerbach, MD;  Location: [facility];  Service: Gynecology;  Laterality: Left;   MOUTH SURGERY     spinal tap       OB History     Gravida   2   Para  2   Term      Preterm      AB      Living  1      SAB      IAB      Ectopic      Multiple      Live Births              Family History  Problem Relation Age of Onset   Heart disease Mother    Heart disease Father    Breast cancer Sister 36    Social History   Tobacco Use   Smoking status: Never   Smokeless tobacco: Never  Vaping Use   Vaping Use: Never used  Substance Use Topics   Alcohol use: No    Alcohol/week: 0.0 standard drinks   Drug use: No    Home Medications Prior to Admission medications   Medication Sig Start Date End Date Taking? Authorizing Provider  aspirin EC 81 MG tablet Take 1 tablet (81 mg total) by mouth daily. Swallow whole. 11/07/20   Brunetta Genera, MD  dasatinib (SPRYCEL) 100 MG tablet TAKE 1 TABLET (100 MG TOTAL) BY MOUTH DAILY. 02/15/21 02/15/22  Brunetta Genera, MD  furosemide (LASIX) 20 MG tablet Take 20 mg by mouth daily. 09/06/20   [provider]  gabapentin (NEURONTIN) 100  MG capsule Take 100 mg by mouth 3 (three) times daily. 10/23/20   [provider]  latanoprost (XALATAN) 0.005 % ophthalmic solution Place 1 drop into both eyes at bedtime.     [provider]  loratadine (CLARITIN) 10 MG tablet Take 10 mg by mouth daily as needed for allergies.    [provider]  metFORMIN (GLUCOPHAGE) 500 MG tablet Take by mouth 2 (two) times daily with a meal.    [provider]  propranolol (INDERAL) 10 MG tablet Take 10 mg by mouth 3 (three) times daily.    [provider]  traMADol (ULTRAM) 50 MG tablet Take 50 mg by mouth every 6 (six) hours as needed for moderate pain.     [provider]  triamcinolone cream (KENALOG) 0.1 % Apply 1 application topically 2 (two) times daily. Reported on 06/06/2016    [provider]  triamterene-hydrochlorothiazide (DYAZIDE) 37.5-25 MG capsule Take 1 capsule by mouth daily.    [provider]     Allergies    Penicillins and Sulfa antibiotics  Review of Systems   Review of Systems  Constitutional:  Positive for fever. Negative for appetite change and fatigue.  HENT:  Negative for congestion, ear discharge and sinus pressure.   Eyes:  Negative for discharge.  Respiratory:  Positive for cough.   Cardiovascular:  Negative for chest pain.  Gastrointestinal:  Negative for abdominal pain and diarrhea.  Genitourinary:  Negative for frequency and hematuria.  Musculoskeletal:  Negative for back pain.  Skin:  Negative for rash.  Neurological:  Negative for seizures and headaches.  Psychiatric/Behavioral:  Negative for hallucinations.    Physical Exam Updated Vital Signs BP (!) 146/68   Pulse 96   Temp (!) 100.5 F (38.1 C) (Oral)   Resp 15   Ht 4\' 11"  (1.499 m)   Wt 77.1 kg   SpO2 90%   BMI 34.34 kg/m   Physical Exam Vitals and nursing note reviewed.  Constitutional:      Appearance: She is well-developed.  HENT:     Head: Normocephalic.     Nose: Nose normal.  Eyes:     General: No scleral icterus.    Conjunctiva/sclera: Conjunctivae normal.  Neck:     Thyroid: No thyromegaly.  Cardiovascular:     Rate and Rhythm: Normal rate and regular rhythm.     Heart sounds: No murmur heard.   No friction rub. No gallop.  Pulmonary:     Breath sounds: No stridor. No wheezing or rales.  Chest:     Chest wall: No tenderness.  Abdominal:     General: There is no distension.     Tenderness: There is no abdominal tenderness. There is no rebound.  Musculoskeletal:        General: Normal range of motion.     Cervical back: Neck supple.  Lymphadenopathy:     Cervical: No cervical adenopathy.  Skin:    Findings: No erythema or rash.  Neurological:     Mental Status: She is alert and oriented to person, place, and time.     Motor: No abnormal muscle tone.     Coordination: Coordination normal.  Psychiatric:        Behavior: Behavior normal.    ED Results /  Procedures / Treatments   Labs (all labs ordered are listed, but only abnormal results are displayed) Labs Reviewed  COMPREHENSIVE METABOLIC PANEL - Abnormal; Notable for the following components:      Result  Value   Sodium 132 (*)    Potassium 3.2 (*)    Chloride 96 (*)    Glucose, Bld 156 (*)    Calcium 8.8 (*)    All other components within normal limits  GROUP A STREP BY PCR  RESP PANEL BY RT-PCR (FLU A&B, COVID) ARPGX2  CBC    EKG None  Radiology DG Chest Portable 1 View  Result Date: 10/25/2021 CLINICAL DATA:  Fever EXAM: PORTABLE CHEST 1 VIEW COMPARISON:  09/01/2017 FINDINGS: The heart size and mediastinal contours are within normal limits. Both lungs are clear. The visualized skeletal structures are unremarkable. IMPRESSION: No active disease. Electronically Signed   By: Ulyses Jarred M.D.   On: 10/25/2021 20:44    Procedures Procedures   Medications Ordered in ED Medications - No data to display  ED Course  I have reviewed the triage vital signs and the nursing notes.  Pertinent labs & imaging results that were available during my care of the patient were reviewed by me and considered in my medical decision making (see chart for details).    MDM Rules/Calculators/A&P                          Patient with COVID-19.  Disposition determined by colleague.  She will follow-up with her PCP as needed  Final Clinical Impression(s) / ED Diagnoses Final diagnoses:  None    Rx / DC Orders ED Discharge Orders     None        Milton Ferguson, MD 10/26/21 1912

## 2021-10-25 NOTE — ED Notes (Signed)
Patient is being discharged from the Urgent Care and sent to the Emergency Department via private vehicle . Per LG, PA, patient is in need of higher level of care due to high fever, cancer patient. Patient is aware and verbalizes understanding of plan of care.  Vitals:   10/25/21 1852  BP: (!) 171/74  Pulse: 86  Resp: 16  Temp: (!) 103.2 F (39.6 C)  SpO2: 94%

## 2021-10-25 NOTE — ED Provider Notes (Signed)
Emergency Medicine Provider Triage Evaluation Note  Erica [name] , a 75 y.o. female  was evaluated in triage.  Pt complains of a fever. Pt denies exposure to covid or influenza  Pt has a history of CML followed by Dr. Irene [name]   Review of Systems  Positive: Fever to 103 Negative: Chest pain or bodyaches  Physical Exam  BP (!) 160/76 (BP Location: Left Arm)   Pulse 94   Temp (!) 100.5 F (38.1 C) (Oral)   Resp 15   Ht 4\' 11"  (1.499 m)   Wt 77.1 kg   SpO2 95%   BMI 34.34 kg/m  Gen:   Awake, no distress   Resp:  Normal effort  MSK:   Moves extremities without difficulty  Other:    Medical Decision Making  Medically screening exam initiated at 8:13 PM.  Appropriate orders placed.  Erica [name] was informed that the remainder of the evaluation will be completed by another provider, this initial triage assessment does not replace that evaluation, and the importance of remaining in the ED until their evaluation is complete.     Fransico Meadow, [place] 10/25/21 2019    Tegeler, Gwenyth Allegra, MD 10/25/21 [phone]

## 2021-10-25 NOTE — ED Triage Notes (Signed)
Patient said she has 103F fever. She said she feels tired, has congestion, ears hurt. Patient has chronic M Leukemia.

## 2021-10-25 NOTE — ED Provider Notes (Signed)
[place]    CSN: [id number] Arrival date & time: 10/25/21  1821      History   Chief Complaint Chief Complaint  Patient presents with   Cough   Nasal Congestion   Sore Throat    HPI Erica [name] is a 75 y.o. female presenting with viral syndrome for 2 days.  Medical history CML, not in remission.  Of note, she takes Sprycel for her cancer, and cannot take ibuprofen due to this. States she cannot take tylenol either though I do not see record of allergy or drug interaction with this.  Describes generalized body aches, weakness, fatigue, decreased appetite.  Denies chest pain, shortness of breath.  HPI  Past Medical History:  Diagnosis Date   Bilateral swelling of feet    Cataract    Diabetes mellitus without complication (HCC)    diet controlled   Eczema    Elevated cholesterol    GERD (gastroesophageal reflux disease)    Glaucoma    Hypertension    Mitral valve prolapse    Simple endometrial hyperplasia without atypia 07/2016   In endometrial polyp with surrounding endometrium inactive recommend follow up ultrasound for endometrial echo 1 year   Spondylisthesis     There are no problems to display for this patient.   Past Surgical History:  Procedure Laterality Date   CESAREAN SECTION     DILATATION & CURETTAGE/HYSTEROSCOPY WITH MYOSURE N/A 08/05/2016   Procedure: DILATATION & CURETTAGE/HYSTEROSCOPY WITH MYOSURE;  Surgeon: Anastasio Auerbach, MD;  Location: [facility];  Service: Gynecology;  Laterality: N/A;   EXCISION OF SKIN TAG Left 08/05/2016   Procedure: EXCISION OF SKIN TAG;  Surgeon: Anastasio Auerbach, MD;  Location: [facility];  Service: Gynecology;  Laterality: Left;   MOUTH SURGERY     spinal tap      OB History     Gravida  2   Para  2   Term      Preterm      AB      Living  1      SAB      IAB      Ectopic      Multiple      Live Births               Home Medications    Prior to Admission medications    Medication Sig Start Date End Date Taking? Authorizing Provider  aspirin EC 81 MG tablet Take 1 tablet (81 mg total) by mouth daily. Swallow whole. 11/07/20   Brunetta Genera, MD  dasatinib (SPRYCEL) 100 MG tablet TAKE 1 TABLET (100 MG TOTAL) BY MOUTH DAILY. 02/15/21 02/15/22  Brunetta Genera, MD  furosemide (LASIX) 20 MG tablet Take 20 mg by mouth daily. 09/06/20   [provider]  gabapentin (NEURONTIN) 100 MG capsule Take 100 mg by mouth 3 (three) times daily. 10/23/20   [provider]  latanoprost (XALATAN) 0.005 % ophthalmic solution Place 1 drop into both eyes at bedtime.     [provider]  loratadine (CLARITIN) 10 MG tablet Take 10 mg by mouth daily as needed for allergies.    [provider]  metFORMIN (GLUCOPHAGE) 500 MG tablet Take by mouth 2 (two) times daily with a meal.    [provider]  propranolol (INDERAL) 10 MG tablet Take 10 mg by mouth 3 (three) times daily.    [provider]  traMADol (ULTRAM) 50 MG tablet Take 50  mg by mouth every 6 (six) hours as needed for moderate pain.     [provider]  triamcinolone cream (KENALOG) 0.1 % Apply 1 application topically 2 (two) times daily. Reported on 06/06/2016    [provider]  triamterene-hydrochlorothiazide (DYAZIDE) 37.5-25 MG capsule Take 1 capsule by mouth daily.    [provider]    Family History Family History  Problem Relation Age of Onset   Heart disease Mother    Heart disease Father    Breast cancer Sister 66    Social History Social History   Tobacco Use   Smoking status: Never   Smokeless tobacco: Never  Vaping Use   Vaping Use: Never used  Substance Use Topics   Alcohol use: No    Alcohol/week: 0.0 standard drinks   Drug use: No     Allergies   Penicillins and Sulfa antibiotics   Review of Systems Review of Systems  Constitutional:  Negative for appetite change, chills and fever.  HENT:  Negative  for congestion, ear pain, rhinorrhea, sinus pressure, sinus pain and sore throat.   Eyes:  Negative for redness and visual disturbance.  Respiratory:  Negative for cough, chest tightness, shortness of breath and wheezing.   Cardiovascular:  Negative for chest pain and palpitations.  Gastrointestinal:  Negative for abdominal pain, constipation, diarrhea, nausea and vomiting.  Genitourinary:  Negative for dysuria, frequency and urgency.  Musculoskeletal:  Positive for myalgias.  Neurological:  Negative for dizziness, weakness and headaches.  Psychiatric/Behavioral:  Negative for confusion.   All other systems reviewed and are negative.   Physical Exam Triage Vital Signs ED Triage Vitals  Enc Vitals Group     BP 10/25/21 1852 (!) 171/74     Pulse Rate 10/25/21 1852 86     Resp 10/25/21 1852 16     Temp 10/25/21 1852 (!) 103.2 F (39.6 C)     Temp Source 10/25/21 1852 Oral     SpO2 10/25/21 1852 94 %     Weight --      Height --      Head Circumference --      Peak Flow --      Pain Score 10/25/21 1851 4     Pain Loc --      Pain Edu? --      Excl. in [place]? --    No data found.  Updated Vital Signs BP (!) 171/74   Pulse 86   Temp (!) 103.2 F (39.6 C) (Oral)   Resp 16   SpO2 94%   Visual Acuity Right Eye Distance:   Left Eye Distance:   Bilateral Distance:    Right Eye Near:   Left Eye Near:    Bilateral Near:     Physical Exam Vitals reviewed.  Constitutional:      General: She is not in acute distress.    Appearance: Normal appearance. She is not ill-appearing.  HENT:     Head: Normocephalic and atraumatic.     Right Ear: Tympanic membrane, ear canal and external ear normal. No tenderness. No middle ear effusion. There is no impacted cerumen. Tympanic membrane is not perforated, erythematous, retracted or bulging.     Left Ear: Tympanic membrane, ear canal and external ear normal. No tenderness.  No middle ear effusion. There is no impacted cerumen. Tympanic  membrane is not perforated, erythematous, retracted or bulging.     Nose: Nose normal. No congestion.     Mouth/Throat:  Mouth: Mucous membranes are moist.     Pharynx: Uvula midline. No oropharyngeal exudate or posterior oropharyngeal erythema.  Eyes:     Extraocular Movements: Extraocular movements intact.     Pupils: Pupils are equal, round, and reactive to light.  Cardiovascular:     Rate and Rhythm: Normal rate and regular rhythm.     Heart sounds: Normal heart sounds.  Pulmonary:     Effort: Pulmonary effort is normal.     Breath sounds: Normal breath sounds. No decreased breath sounds, wheezing, rhonchi or rales.  Abdominal:     Palpations: Abdomen is soft.     Tenderness: There is no abdominal tenderness. There is no guarding or rebound.  Lymphadenopathy:     Cervical: No cervical adenopathy.     Right cervical: No superficial cervical adenopathy.    Left cervical: No superficial cervical adenopathy.  Neurological:     General: No focal deficit present.     Mental Status: She is alert and oriented to person, place, and time.  Psychiatric:        Mood and Affect: Mood normal.        Behavior: Behavior normal.        Thought Content: Thought content normal.        Judgment: Judgment normal.     UC Treatments / Results  Labs (all labs ordered are listed, but only abnormal results are displayed) Labs Reviewed - No data to display  EKG   Radiology No results found.  Procedures Procedures (including critical care time)  Medications Ordered in UC Medications - No data to display  Initial Impression / Assessment and Plan / UC Course  I have reviewed the triage vital signs and the nursing notes.  Pertinent labs & imaging results that were available during my care of the patient were reviewed by me and considered in my medical decision making (see chart for details).     This patient is a very pleasant 75 y.o. year old female presenting with febrile illness  (temperature currently 103.2). pt with CML that does not appear to be in remission. She states she cannot take tylenol or ibuprofen per pt as this interacts with her Sprycel. Ddx is likely influenza, but unfortunately we do not have rapid influenza tests at this urgent care. Ddx also includes pneumonia, sepsis, etc. Head straight to ED in personal vehicle driven by friend.  Final Clinical Impressions(s) / UC Diagnoses   Final diagnoses:  Febrile illness  CML (chronic myelocytic leukemia) ([place])     Discharge Instructions      -I am very concerned about your high fever considering you have leukemia and cannot take Tylenol or ibuprofen.  Please go straight to the ED. They will need to rule out systemic infections like sepsis.   ED Prescriptions   None    PDMP not reviewed this encounter.   Hazel Sams, PA-C 10/25/21 [phone]

## 2021-10-25 NOTE — ED Triage Notes (Signed)
PT has CML, sees cancer center.  PT reports sore throat, cough, congestion, fatigue, started yesterday.

## 2021-10-25 NOTE — ED Provider Notes (Signed)
Received sign out from previous provider, please see his note for complete H&P.  Pt here with cold sxs.  If flu positive, treat with tamiflu and if negative, will d/c with doxy.    Pt sign out to oncoming team who will f/u on viral resp panel   Domenic Moras, PA-C 10/26/21 Lysle Morales, MD 10/26/21 [phone]

## 2021-10-25 NOTE — Discharge Instructions (Addendum)
-  I am very concerned about your high fever considering you have leukemia and cannot take Tylenol or ibuprofen.  Please go straight to the ED. They will need to rule out systemic infections like sepsis.

## 2021-10-25 NOTE — ED Notes (Signed)
PT reports she has been told she is not allowed to take any tylenol or ibuprofen with the Sprycel medication that she is taking. She took an 81 mg ASA this AM and hoped it would treat her fever.

## 2021-10-25 NOTE — ED Notes (Signed)
Provider made aware of patient temp and history.

## 2021-10-26 LAB — RESP PANEL BY RT-PCR (FLU A&B, COVID) ARPGX2
Influenza A by PCR: NEGATIVE
Influenza B by PCR: NEGATIVE
SARS Coronavirus 2 by RT PCR: POSITIVE — AB

## 2021-10-26 MED ORDER — MOLNUPIRAVIR EUA 200MG CAPSULE: 4.0000 | ORAL_CAPSULE | Freq: Two times a day (BID) | ORAL | 0 refills | Status: AC

## 2021-10-26 NOTE — ED Provider Notes (Signed)
  12:37 AM RVP with Covid +.  Able to ambulate in the ED and maintain saturations of 94-95%.  Patient desiring discharge which seems reasonable.  She is on Dasatinib for her CML which has interaction with paxlovid so will start molnupiravir.  Will have her follow-up closely with PCP.  Given strict return precautions for any new/acute changes.   Larene Pickett, PA-C 10/26/21 0111    Ezequiel Essex, MD 10/26/21 [phone]

## 2021-10-26 NOTE — ED Notes (Signed)
Ambulated pt around the room, maintained O2 saturation of 94-95%

## 2021-10-26 NOTE — Discharge Instructions (Signed)
Your covid test was positive. Take the prescribed medication as directed.  Make sure to rest and hydrate. Follow-up with your primary care doctor. Return to the ED for new or worsening symptoms-- worsening shortness of breath, uncontrolled fevers, coughing up blood, etc.

## 2021-10-29 LAB — BCR-ABL1 KINASE DOMAIN MUTATION ANALYSIS

## 2021-10-30 ENCOUNTER — Other Ambulatory Visit (HOSPITAL_COMMUNITY): Payer: Self-pay

## 2021-10-30 MED FILL — Dasatinib Tab 100 MG: ORAL | 30 days supply | Qty: 30 | Fill #8 | Status: AC

## 2021-11-02 ENCOUNTER — Emergency Department (HOSPITAL_BASED_OUTPATIENT_CLINIC_OR_DEPARTMENT_OTHER)
Admission: EM | Admit: 2021-11-02 | Discharge: 2021-11-02 | Disposition: A | Discharge status: Home / Self Care | Payer: BC Managed Care – PPO | Attending: Emergency Medicine | Admitting: Emergency Medicine

## 2021-11-02 ENCOUNTER — Emergency Department (HOSPITAL_BASED_OUTPATIENT_CLINIC_OR_DEPARTMENT_OTHER): Payer: BC Managed Care – PPO

## 2021-11-02 ENCOUNTER — Encounter (HOSPITAL_COMMUNITY): Payer: Self-pay

## 2021-11-02 ENCOUNTER — Ambulatory Visit (HOSPITAL_COMMUNITY)
Admission: EM | Admit: 2021-11-02 | Discharge: 2021-11-02 | Disposition: A | Discharge status: Home / Self Care | Payer: BC Managed Care – PPO

## 2021-11-02 ENCOUNTER — Other Ambulatory Visit: Payer: Self-pay

## 2021-11-02 ENCOUNTER — Encounter (HOSPITAL_BASED_OUTPATIENT_CLINIC_OR_DEPARTMENT_OTHER): Payer: Self-pay | Admitting: *Deleted

## 2021-11-02 DIAGNOSIS — R072 Precordial pain: Secondary | ICD-10-CM | POA: Diagnosis not present

## 2021-11-02 DIAGNOSIS — R079 Chest pain, unspecified: Secondary | ICD-10-CM

## 2021-11-02 DIAGNOSIS — E119 Type 2 diabetes mellitus without complications: Secondary | ICD-10-CM | POA: Insufficient documentation

## 2021-11-02 DIAGNOSIS — Z859 Personal history of malignant neoplasm, unspecified: Secondary | ICD-10-CM | POA: Insufficient documentation

## 2021-11-02 DIAGNOSIS — I10 Essential (primary) hypertension: Secondary | ICD-10-CM | POA: Diagnosis not present

## 2021-11-02 LAB — CBC WITH DIFFERENTIAL/PLATELET
Abs Immature Granulocytes: 0.04 10*3/uL (ref 0.00–0.07)
Basophils Absolute: 0 10*3/uL (ref 0.0–0.1)
Basophils Relative: 0 %
Eosinophils Absolute: 0.1 10*3/uL (ref 0.0–0.5)
Eosinophils Relative: 1 %
HCT: 39.4 % (ref 36.0–46.0)
Hemoglobin: 13 g/dL (ref 12.0–15.0)
Immature Granulocytes: 1 %
Lymphocytes Relative: 32 %
Lymphs Abs: 2.5 10*3/uL (ref 0.7–4.0)
MCH: 28.7 pg (ref 26.0–34.0)
MCHC: 33 g/dL (ref 30.0–36.0)
MCV: 87 fL (ref 80.0–100.0)
Monocytes Absolute: 0.8 10*3/uL (ref 0.1–1.0)
Monocytes Relative: 10 %
Neutro Abs: 4.5 10*3/uL (ref 1.7–7.7)
Neutrophils Relative %: 56 %
Platelets: 272 10*3/uL (ref 150–400)
RBC: 4.53 MIL/uL (ref 3.87–5.11)
RDW: 13.4 % (ref 11.5–15.5)
WBC: 7.9 10*3/uL (ref 4.0–10.5)
nRBC: 0 % (ref 0.0–0.2)

## 2021-11-02 LAB — TROPONIN I (HIGH SENSITIVITY): Troponin I (High Sensitivity): 2 ng/L (ref <18)

## 2021-11-02 LAB — COMPREHENSIVE METABOLIC PANEL
ALT: 23 U/L (ref 0–44)
AST: 31 U/L (ref 15–41)
Albumin: 3.9 g/dL (ref 3.5–5.0)
Alkaline Phosphatase: 48 U/L (ref 38–126)
Anion gap: 9 (ref 5–15)
BUN: 8 mg/dL (ref 8–23)
CO2: 26 mmol/L (ref 22–32)
Calcium: 8.8 mg/dL — ABNORMAL LOW (ref 8.9–10.3)
Chloride: 99 mmol/L (ref 98–111)
Creatinine, Ser: 0.73 mg/dL (ref 0.44–1.00)
GFR, Estimated: >60 mL/min (ref >60)
Glucose, Bld: 141 mg/dL — ABNORMAL HIGH (ref 70–99)
Potassium: 4.2 mmol/L (ref 3.5–5.1)
Sodium: 134 mmol/L — ABNORMAL LOW (ref 135–145)
Total Bilirubin: 0.6 mg/dL (ref 0.3–1.2)
Total Protein: 7.3 g/dL (ref 6.5–8.1)

## 2021-11-02 LAB — LIPASE, BLOOD: Lipase: 23 U/L (ref 11–51)

## 2021-11-02 NOTE — ED Triage Notes (Signed)
Pt reports pain in ribs, worse with certain positions. States she tested covid+ on 12/2

## 2021-11-02 NOTE — ED Provider Notes (Signed)
Emergency Department Provider Note   I have reviewed the triage vital signs and the nursing notes.   HISTORY  Chief Complaint Chest Pain   HPI DORETHEA STRUBEL is a 75 y.o. female with PMH reviewed below including CML presents to the ED from urgent care with chest soreness.  Patient was diagnosed and treated for COVID on 12/2.  She completed antiviral medication is feeling much better from a COVID standpoint.  She is noticed that starting today she developed some soreness in her lower chest slightly worse on the right.  Pain is improved with standing and worse with sitting or leaning.  She is not feeling short of breath.  The pain is very mild and more like a soreness rather than sharp stabbing or ripping pain.  She is not having pleuritic discomfort.  No vomiting or diarrhea.  She went to urgent care with this complaint and was redirected here for further evaluation.  No prior history of ACS or PE.   Past Medical History:  Diagnosis Date   Bilateral swelling of feet    Cancer (HCC)    Cataract    Diabetes mellitus without complication (HCC)    diet controlled   Eczema    Elevated cholesterol    GERD (gastroesophageal reflux disease)    Glaucoma    Hypertension    Mitral valve prolapse    Simple endometrial hyperplasia without atypia 07/2016   In endometrial polyp with surrounding endometrium inactive recommend follow up ultrasound for endometrial echo 1 year   Spondylisthesis     There are no problems to display for this patient.   Past Surgical History:  Procedure Laterality Date   CESAREAN SECTION     DILATATION & CURETTAGE/HYSTEROSCOPY WITH MYOSURE N/A 08/05/2016   Procedure: DILATATION & CURETTAGE/HYSTEROSCOPY WITH MYOSURE;  Surgeon: Anastasio Auerbach, MD;  Location: [facility];  Service: Gynecology;  Laterality: N/A;   EXCISION OF SKIN TAG Left 08/05/2016   Procedure: EXCISION OF SKIN TAG;  Surgeon: Anastasio Auerbach, MD;  Location: [facility];  Service: Gynecology;   Laterality: Left;   MOUTH SURGERY     spinal tap      Allergies Penicillins and Sulfa antibiotics  Family History  Problem Relation Age of Onset   Heart disease Mother    Heart disease Father    Breast cancer Sister 59    Social History Social History   Tobacco Use   Smoking status: Never   Smokeless tobacco: Never  Vaping Use   Vaping Use: Never used  Substance Use Topics   Alcohol use: No    Alcohol/week: 0.0 standard drinks   Drug use: No    Review of Systems  Constitutional: No fever/chills Eyes: No visual changes. ENT: No sore throat. Cardiovascular: Positive chest pain. Respiratory: Denies shortness of breath. Gastrointestinal: No abdominal pain.  No nausea, no vomiting.  No diarrhea.  No constipation. Genitourinary: Negative for dysuria. Musculoskeletal: Negative for back pain. Skin: Negative for rash. Neurological: Negative for headaches, focal weakness or numbness.  10-point ROS otherwise negative.  ____________________________________________   PHYSICAL EXAM:  VITAL SIGNS: ED Triage Vitals [11/02/21 1629]  Enc Vitals Group     BP 137/67     Pulse Rate 68     Resp 14     Temp 98.1 F (36.7 C)     Temp Source Oral     SpO2 97 %    Constitutional: Alert and oriented. Well appearing and in no acute distress. Eyes: Conjunctivae  are normal.  Head: Atraumatic. Nose: No congestion/rhinnorhea. Mouth/Throat: Mucous membranes are moist.   Neck: No stridor.   Cardiovascular: Normal rate, regular rhythm. Good peripheral circulation. Grossly normal heart sounds.   Respiratory: Normal respiratory effort.  No retractions. Lungs CTAB. Gastrointestinal: Soft and nontender. No distention.  Musculoskeletal: No lower extremity tenderness nor edema. No gross deformities of extremities. Neurologic:  Normal speech and language. No gross focal neurologic deficits are appreciated.  Skin:  Skin is warm, dry and intact. No rash  noted.  ____________________________________________   LABS (all labs ordered are listed, but only abnormal results are displayed)  Labs Reviewed  COMPREHENSIVE METABOLIC PANEL - Abnormal; Notable for the following components:      Result Value   Sodium 134 (*)    Glucose, Bld 141 (*)    Calcium 8.8 (*)    All other components within normal limits  LIPASE, BLOOD  CBC WITH DIFFERENTIAL/PLATELET  TROPONIN I (HIGH SENSITIVITY)  TROPONIN I (HIGH SENSITIVITY)   ____________________________________________  EKG  Rate: 70 PR: 129 QTc: 412  Sinus rhythm. Narrow QRS. No ST elevation or depression.  ____________________________________________  RADIOLOGY  DG Chest Portable 1 View  Result Date: 11/02/2021 CLINICAL DATA:  Chest pain EXAM: PORTABLE CHEST 1 VIEW COMPARISON:  Chest x-ray dated October 25, 2021 FINDINGS: Cardiac and mediastinal contours are within normal limits for AP technique. Possible left lower lobe consolidation. No large pleural effusion or evidence of pneumothorax. IMPRESSION: Mild left lower lobe opacity which could be due to atelectasis or infection. Electronically Signed   By: Yetta Glassman M.D.   On: 11/02/2021 17:20    ____________________________________________   PROCEDURES  Procedure(s) performed:   Procedures  None  ____________________________________________   INITIAL IMPRESSION / ASSESSMENT AND PLAN / ED COURSE  Pertinent labs & imaging results that were available during my care of the patient were reviewed by me and considered in my medical decision making (see chart for details).   Patient presents to the ED with CP post-COVID. Vitals, exam, and history are not consistent with PE. Low suspicion for ACS. Plan for labs, CXR, and reassess.   Differential includes all life-threatening causes for chest pain. This includes but is not exclusive to acute coronary syndrome, aortic dissection, pulmonary embolism, cardiac tamponade,  community-acquired pneumonia, pericarditis, musculoskeletal chest wall pain, etc.  Labs reviewed.  Troponin negative.  Lipase and LFTs are within normal limits.  Chest x-ray with some likely atelectasis.  Could be some leftover of her pneumonitis after her recent COVID infection.  She is clinically improving and is not having fever or other symptoms to suggest a secondary bacterial process in the lungs.  Plan to have her take Tylenol and over-the-counter Voltaren gel for her discomfort and return with any new or suddenly worsening symptoms.  ____________________________________________  FINAL CLINICAL IMPRESSION(S) / ED DIAGNOSES  Final diagnoses:  Precordial chest pain    Note:  This document was prepared using Dragon voice recognition software and may include unintentional dictation errors.  Nanda Quinton, MD, [facility]    Marcelle Bebout, [facility], MD 11/02/21 [phone]

## 2021-11-02 NOTE — ED Notes (Signed)
XR at bedside

## 2021-11-02 NOTE — ED Triage Notes (Signed)
Pt present with right side rib pain x 1 day. Pt has h/o leukemia.

## 2021-11-02 NOTE — ED Provider Notes (Signed)
[place]  ____________________________________________  Time seen: Approximately 3:16 PM  I have reviewed the triage vital signs and the nursing notes.   HISTORY  Chief Complaint Chest Pain   Historian Patient     HPI Erica [name] is a 75 y.o. female with a history of chronic myeloid leukemia, diabetes, hyperlipidemia and GERD presents to the emergency department with nonspecific chest pain and upper abdominal pain.  Patient states that she has been diagnosed with COVID-19.  She states that the pain is constant and standing relieves her pain.  She states that she has had multiple episodes of vomiting today.  She denies chest tightness but states that she has occasionally short of breath.  She has been afebrile at home.  No other alleviating measures have been attempted.   Past Medical History:  Diagnosis Date   Bilateral swelling of feet    Cancer (HCC)    Cataract    Diabetes mellitus without complication (HCC)    diet controlled   Eczema    Elevated cholesterol    GERD (gastroesophageal reflux disease)    Glaucoma    Hypertension    Mitral valve prolapse    Simple endometrial hyperplasia without atypia 07/2016   In endometrial polyp with surrounding endometrium inactive recommend follow up ultrasound for endometrial echo 1 year   Spondylisthesis      Immunizations up to date:  Yes.     Past Medical History:  Diagnosis Date   Bilateral swelling of feet    Cancer (HCC)    Cataract    Diabetes mellitus without complication (HCC)    diet controlled   Eczema    Elevated cholesterol    GERD (gastroesophageal reflux disease)    Glaucoma    Hypertension    Mitral valve prolapse    Simple endometrial hyperplasia without atypia 07/2016   In endometrial polyp with surrounding endometrium inactive recommend follow up ultrasound for endometrial echo 1 year   Spondylisthesis     There are no problems to display for this patient.   Past  Surgical History:  Procedure Laterality Date   CESAREAN SECTION     DILATATION & CURETTAGE/HYSTEROSCOPY WITH MYOSURE N/A 08/05/2016   Procedure: DILATATION & CURETTAGE/HYSTEROSCOPY WITH MYOSURE;  Surgeon: Anastasio Auerbach, MD;  Location: [facility];  Service: Gynecology;  Laterality: N/A;   EXCISION OF SKIN TAG Left 08/05/2016   Procedure: EXCISION OF SKIN TAG;  Surgeon: Anastasio Auerbach, MD;  Location: [facility];  Service: Gynecology;  Laterality: Left;   MOUTH SURGERY     spinal tap      Prior to Admission medications   Medication Sig Start Date End Date Taking? Authorizing Provider  aspirin EC 81 MG tablet Take 1 tablet (81 mg total) by mouth daily. Swallow whole. 11/07/20   Brunetta Genera, MD  dasatinib (SPRYCEL) 100 MG tablet TAKE 1 TABLET (100 MG TOTAL) BY MOUTH DAILY. 02/15/21 02/15/22  Brunetta Genera, MD  furosemide (LASIX) 20 MG tablet Take 20 mg by mouth daily. 09/06/20   [provider]  gabapentin (NEURONTIN) 100 MG capsule Take 100 mg by mouth 3 (three) times daily. 10/23/20   [provider]  latanoprost (XALATAN) 0.005 % ophthalmic solution Place 1 drop into both eyes at bedtime.     [provider]  loratadine (CLARITIN) 10 MG tablet Take 10 mg by mouth daily as needed for allergies.    [provider]  metFORMIN (GLUCOPHAGE) 500 MG tablet Take by mouth  2 (two) times daily with a meal.    [provider]  propranolol (INDERAL) 10 MG tablet Take 10 mg by mouth 3 (three) times daily.    [provider]  traMADol (ULTRAM) 50 MG tablet Take 50 mg by mouth every 6 (six) hours as needed for moderate pain.     [provider]  triamcinolone cream (KENALOG) 0.1 % Apply 1 application topically 2 (two) times daily. Reported on 06/06/2016    [provider]  triamterene-hydrochlorothiazide (DYAZIDE) 37.5-25 MG capsule Take 1 capsule by mouth daily.    [provider]    Allergies Penicillins and Sulfa  antibiotics  Family History  Problem Relation Age of Onset   Heart disease Mother    Heart disease Father    Breast cancer Sister 47    Social History Social History   Tobacco Use   Smoking status: Never   Smokeless tobacco: Never  Vaping Use   Vaping Use: Never used  Substance Use Topics   Alcohol use: No    Alcohol/week: 0.0 standard drinks   Drug use: No     Review of Systems  Constitutional: No fever/chills Eyes:  No discharge ENT: No upper respiratory complaints. Respiratory: Patient has SOB.  Gastrointestinal: Patient has abdominal pain.  Musculoskeletal: Negative for musculoskeletal pain. Skin: Negative for rash, abrasions, lacerations, ecchymosis.    ____________________________________________   PHYSICAL EXAM:  VITAL SIGNS: ED Triage Vitals  Enc Vitals Group     BP 11/02/21 1437 (!) 158/95     Pulse Rate 11/02/21 1437 60     Resp 11/02/21 1437 16     Temp 11/02/21 1437 98.4 F (36.9 C)     Temp src --      SpO2 11/02/21 1437 100 %     Weight --      Height --      Head Circumference --      Peak Flow --      Pain Score 11/02/21 1439 8     Pain Loc --      Pain Edu? --      Excl. in [place]? --      Constitutional: Alert and oriented. Patient is lying supine. Eyes: Conjunctivae are normal. PERRL. EOMI. Head: Atraumatic. ENT:      Ears: Tympanic membranes are mildly injected with mild effusion bilaterally.       Nose: No congestion/rhinnorhea.      Mouth/Throat: Mucous membranes are moist. Posterior pharynx is mildly erythematous.  Hematological/Lymphatic/Immunilogical: No cervical lymphadenopathy.  Cardiovascular: Normal rate, regular rhythm. Normal S1 and S2.  Good peripheral circulation. Respiratory: Normal respiratory effort without tachypnea or retractions. Lungs CTAB. Good air entry to the bases with no decreased or absent breath sounds. Gastrointestinal: Bowel sounds 4 quadrants. Soft and nontender to palpation. No guarding or rigidity.  No palpable masses. No distention. No CVA tenderness. Musculoskeletal: Full range of motion to all extremities. No gross deformities appreciated. Neurologic:  Normal speech and language. No gross focal neurologic deficits are appreciated.  Skin:  Skin is warm, dry and intact. No rash noted. Psychiatric: Mood and affect are normal. Speech and behavior are normal. Patient exhibits appropriate insight and judgement.   ____________________________________________   LABS (all labs ordered are listed, but only abnormal results are displayed)  Labs Reviewed - No data to display ____________________________________________  EKG   ____________________________________________  RADIOLOGY   No results found.  ____________________________________________    PROCEDURES  Procedure(s) performed:     Procedures  Medications - No data to display   ____________________________________________   INITIAL IMPRESSION / ASSESSMENT AND PLAN / ED COURSE  Pertinent labs & imaging results that were available during my care of the patient were reviewed by me and considered in my medical decision making (see chart for details).      Assessment and plan Chest pain Abdominal pain 75 year old female with complex past medical history presents to the urgent care with chest pain and abdominal pain.  Given the limited work-up capacity in the urgent care setting, patient was referred to the emergency department for further care and management.     ____________________________________________  FINAL CLINICAL IMPRESSION(S) / ED DIAGNOSES  Final diagnoses:  Chest pain, unspecified type      NEW MEDICATIONS STARTED DURING THIS VISIT:  ED Discharge Orders     None           This chart was dictated using voice recognition software/Dragon. Despite best efforts to proofread, errors can occur which can change the meaning. Any change was purely unintentional.     Lannie Fields, PA-C 11/02/21 1528

## 2021-11-02 NOTE — Discharge Instructions (Signed)
You were seen in the emergency room today with chest discomfort.  Your lab work here is not showing signs of infection.  Your x-ray does not show any obvious pneumonia.  There are some changes on the x-ray which may be related to your recent COVID infection but nothing that looks like you will need additional treatment.

## 2021-11-02 NOTE — Discharge Instructions (Signed)
Please go to emergency department at [facility].

## 2021-11-02 NOTE — ED Notes (Signed)
ED Provider at bedside. 

## 2021-11-02 NOTE — ED Notes (Signed)
Patient is being discharged from the Urgent Care and sent to the Emergency Department via POV . Per [place], [place], patient is in need of higher level of care due to chest pain. Patient is aware and verbalizes understanding of plan of care.  Vitals:   11/02/21 1437  BP: (!) 158/95  Pulse: 60  Resp: 16  Temp: 98.4 F (36.9 C)  SpO2: 100%

## 2021-11-05 ENCOUNTER — Other Ambulatory Visit (HOSPITAL_COMMUNITY): Payer: Self-pay

## 2021-11-15 ENCOUNTER — Other Ambulatory Visit (HOSPITAL_COMMUNITY): Payer: Self-pay

## 2021-11-20 ENCOUNTER — Other Ambulatory Visit (HOSPITAL_COMMUNITY): Payer: Self-pay

## 2021-11-26 ENCOUNTER — Other Ambulatory Visit (HOSPITAL_COMMUNITY): Payer: Self-pay

## 2021-11-26 MED FILL — Dasatinib Tab 100 MG: ORAL | 30 days supply | Qty: 30 | Fill #9 | Status: CN

## 2021-11-27 ENCOUNTER — Other Ambulatory Visit (HOSPITAL_COMMUNITY): Payer: Self-pay

## 2021-11-27 MED FILL — Dasatinib Tab 100 MG: ORAL | 30 days supply | Qty: 30 | Fill #9 | Status: CN

## 2021-11-28 ENCOUNTER — Other Ambulatory Visit (HOSPITAL_COMMUNITY): Payer: Self-pay

## 2021-11-28 ENCOUNTER — Other Ambulatory Visit: Payer: Self-pay

## 2021-11-28 DIAGNOSIS — C9210 Chronic myeloid leukemia, BCR/ABL-positive, not having achieved remission: Secondary | ICD-10-CM

## 2021-11-28 MED FILL — Dasatinib Tab 100 MG: ORAL | 30 days supply | Qty: 30 | Fill #9 | Status: AC

## 2021-11-29 ENCOUNTER — Other Ambulatory Visit (HOSPITAL_COMMUNITY): Payer: Self-pay

## 2021-11-29 ENCOUNTER — Other Ambulatory Visit: Payer: Self-pay

## 2021-11-29 ENCOUNTER — Inpatient Hospital Stay: Payer: BC Managed Care – PPO | Attending: Hematology

## 2021-11-29 DIAGNOSIS — C9210 Chronic myeloid leukemia, BCR/ABL-positive, not having achieved remission: Secondary | ICD-10-CM | POA: Insufficient documentation

## 2021-11-29 LAB — CBC WITH DIFFERENTIAL (CANCER CENTER ONLY)
Abs Immature Granulocytes: 0.02 10*3/uL (ref 0.00–0.07)
Basophils Absolute: 0 10*3/uL (ref 0.0–0.1)
Basophils Relative: 1 %
Eosinophils Absolute: 0.1 10*3/uL (ref 0.0–0.5)
Eosinophils Relative: 1 %
HCT: 36.2 % (ref 36.0–46.0)
Hemoglobin: 11.2 g/dL — ABNORMAL LOW (ref 12.0–15.0)
Immature Granulocytes: 0 %
Lymphocytes Relative: 47 %
Lymphs Abs: 3.3 10*3/uL (ref 0.7–4.0)
MCH: 27.3 pg (ref 26.0–34.0)
MCHC: 30.9 g/dL (ref 30.0–36.0)
MCV: 88.3 fL (ref 80.0–100.0)
Monocytes Absolute: 0.6 10*3/uL (ref 0.1–1.0)
Monocytes Relative: 8 %
Neutro Abs: 3 10*3/uL (ref 1.7–7.7)
Neutrophils Relative %: 43 %
Platelet Count: 281 10*3/uL (ref 150–400)
RBC: 4.1 MIL/uL (ref 3.87–5.11)
RDW: 14.6 % (ref 11.5–15.5)
WBC Count: 7 10*3/uL (ref 4.0–10.5)
nRBC: 0 % (ref 0.0–0.2)

## 2021-11-29 LAB — CMP (CANCER CENTER ONLY)
ALT: 24 U/L (ref 0–44)
AST: 26 U/L (ref 15–41)
Albumin: 3.8 g/dL (ref 3.5–5.0)
Alkaline Phosphatase: 78 U/L (ref 38–126)
Anion gap: 6 (ref 5–15)
BUN: 18 mg/dL (ref 8–23)
CO2: 30 mmol/L (ref 22–32)
Calcium: 9.3 mg/dL (ref 8.9–10.3)
Chloride: 103 mmol/L (ref 98–111)
Creatinine: 0.87 mg/dL (ref 0.44–1.00)
GFR, Estimated: >60 mL/min (ref >60)
Glucose, Bld: 122 mg/dL — ABNORMAL HIGH (ref 70–99)
Potassium: 4 mmol/L (ref 3.5–5.1)
Sodium: 139 mmol/L (ref 135–145)
Total Bilirubin: 0.4 mg/dL (ref 0.3–1.2)
Total Protein: 6.9 g/dL (ref 6.5–8.1)

## 2021-12-03 ENCOUNTER — Other Ambulatory Visit (HOSPITAL_COMMUNITY): Payer: Self-pay

## 2021-12-05 LAB — BCR/ABL

## 2021-12-11 IMAGING — CR DG HIP (WITH OR WITHOUT PELVIS) 2-3V*L*
2 series · 2 of 2 positions shown · non-contrast
Comparison: 09/01/2017

CLINICAL DATA: Left hip pain.

EXAM:
DG HIP (WITH OR WITHOUT PELVIS) 2-3V LEFT

[t hip ap left]
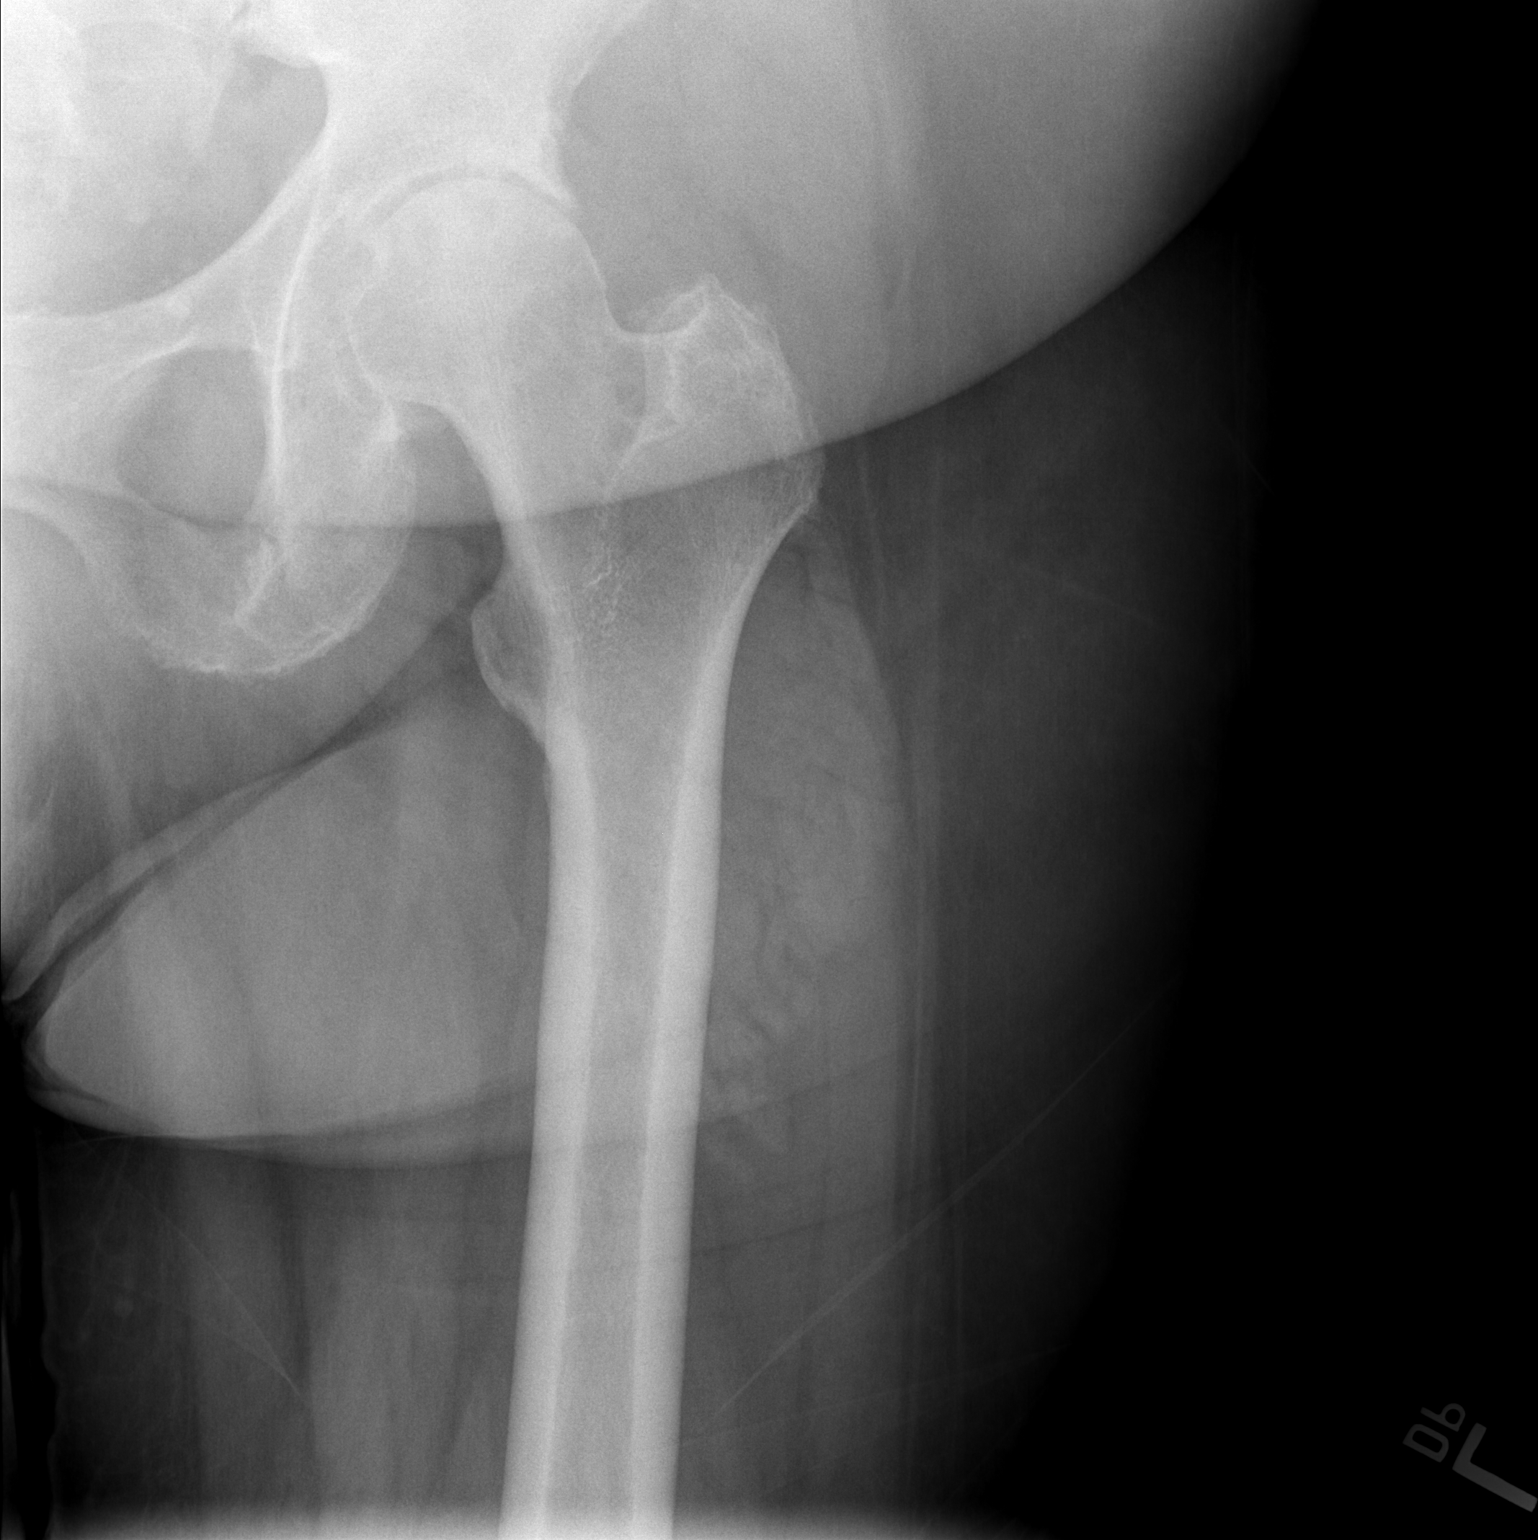

[t hip frog leg left]
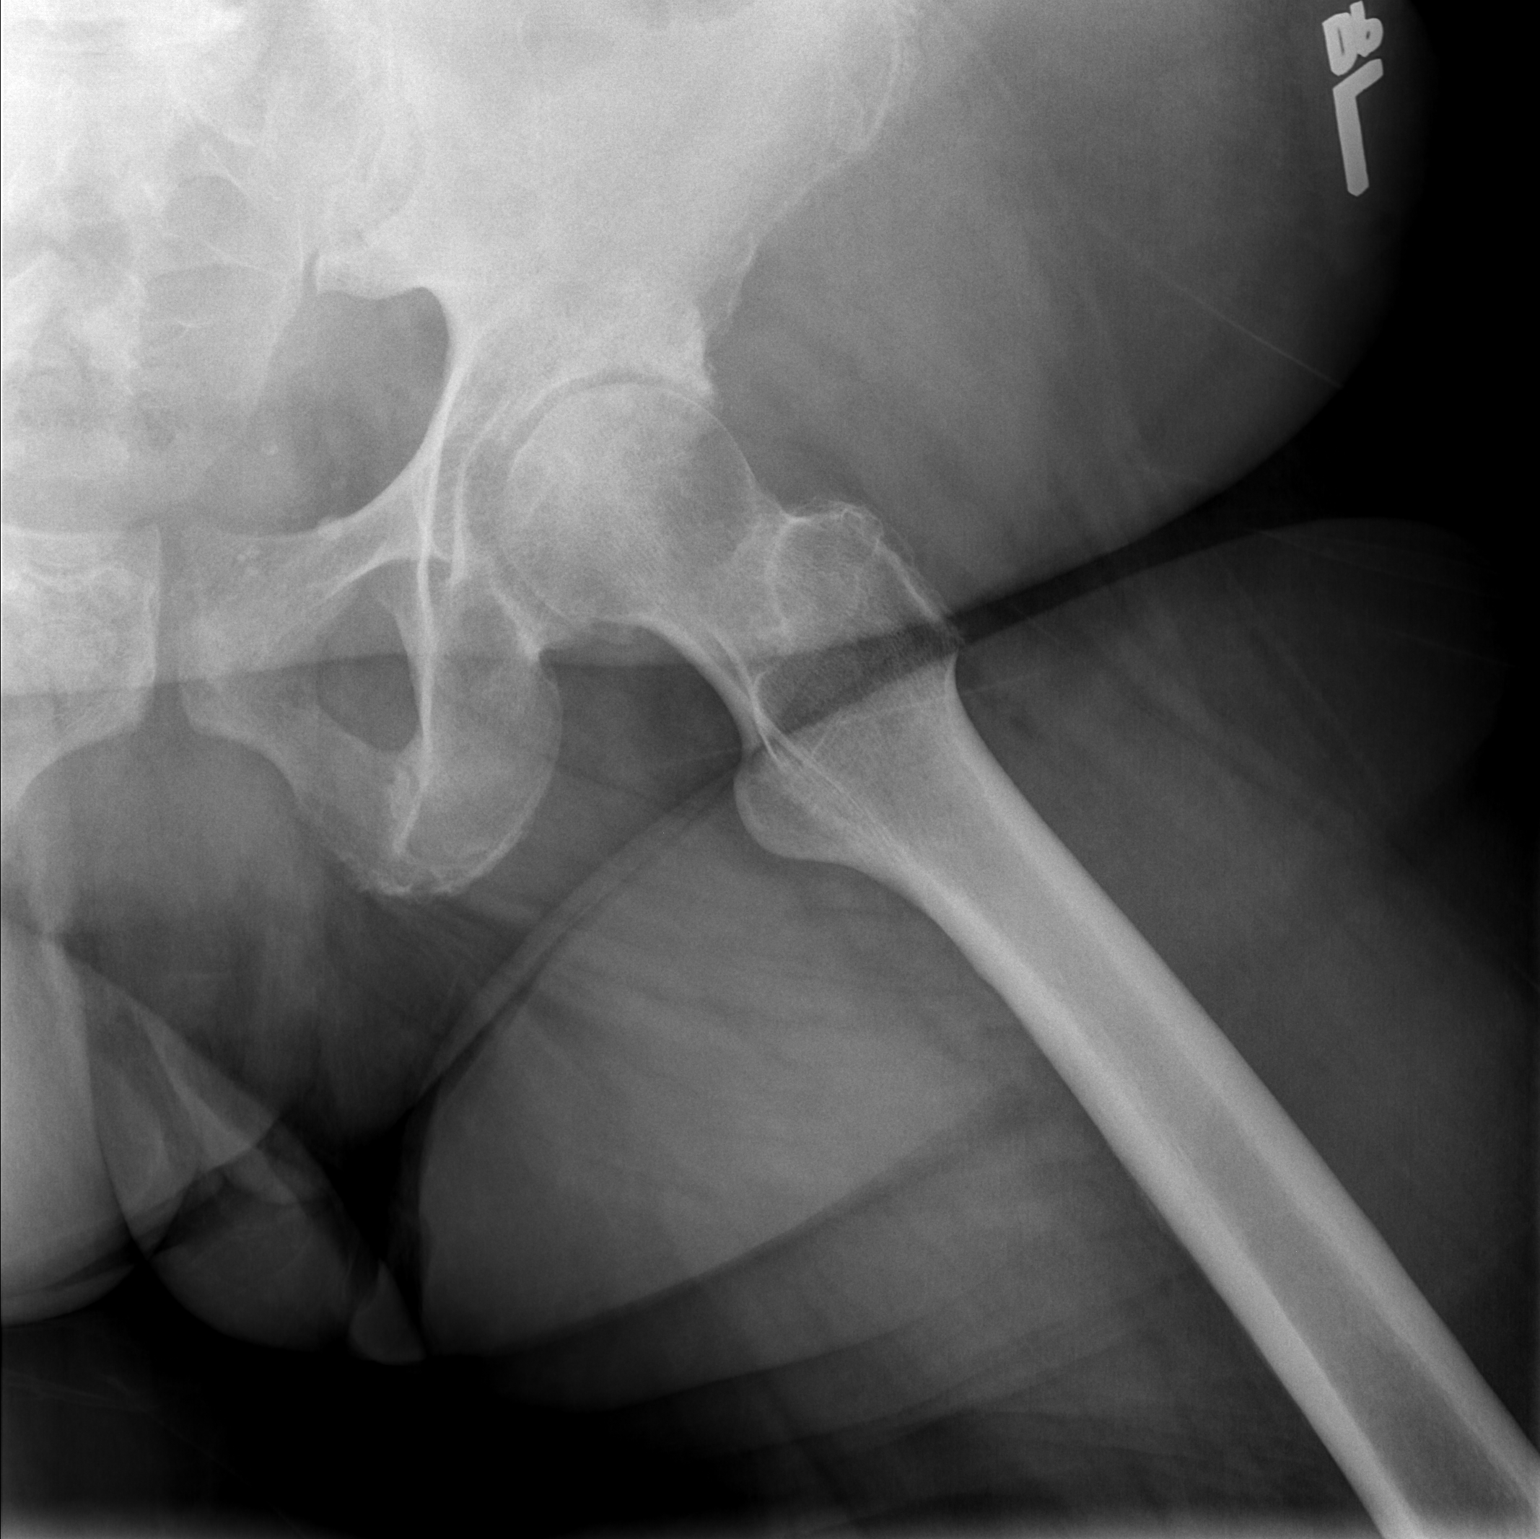

[2 of 2 positions shown; findings below may reference images not displayed]

FINDINGS: There is moderate left hip osteoarthritis. There is no acute
displaced fracture or dislocation. And the osseous mineralization is
within normal limits.
IMPRESSION: Moderate left hip osteoarthritis.

## 2021-12-13 ENCOUNTER — Inpatient Hospital Stay (HOSPITAL_BASED_OUTPATIENT_CLINIC_OR_DEPARTMENT_OTHER): Payer: BC Managed Care – PPO | Admitting: Hematology

## 2021-12-13 ENCOUNTER — Other Ambulatory Visit: Payer: Self-pay

## 2021-12-13 VITALS — BP 150/73 | HR 71 | Temp 97.3°F | Resp 18 | Wt 186.6 lb

## 2021-12-13 DIAGNOSIS — C9210 Chronic myeloid leukemia, BCR/ABL-positive, not having achieved remission: Secondary | ICD-10-CM | POA: Diagnosis not present

## 2021-12-16 ENCOUNTER — Telehealth: Payer: Self-pay | Admitting: Hematology

## 2021-12-16 NOTE — Telephone Encounter (Signed)
Scheduled follow-up appointments per 1/20 los. Patient is aware.

## 2021-12-19 NOTE — Progress Notes (Addendum)
HEMATOLOGY/ONCOLOGY CLINIC NOTE  Date of Service: .12/13/2021   Patient Care Team: Shirline Frees, MD as PCP - General (Family Medicine)  CHIEF COMPLAINTS/PURPOSE OF CONSULTATION:  Follow-up for continued management of CML  HISTORY OF PRESENTING ILLNESS:   Erica [name] is a wonderful 76 y.o. female who has been referred to [place] by Dr. Kenton Kingfisher for evaluation and management of leukocytosis and thrombocytosis. The pt reports that she is doing well overall.   The pt reports that her labs in January of 2021 were normal. Her most recent labs was the first time that she was told of her leukocytosis and thrombocytosis. Pt notes that she has been more fatigued over the last month. Pt hit her leg in August and has had consistent leg swelling since then. She is currently on Lasix for leg swelling. Pt does not feel that her Diabetes has been stable as she has been eating too many sugary foods and was told to work on weight loss. Her Optometrist saw something of concern during her eye exam, which prompted him to order the 10/12/20 labs. Pt does not think that she was diagnosed with Diabetic Retinopathy. She has Glaucoma and avoids steroid use for that reason. Pt notes that Penicilin and Sulfa drugs cause rash.  Most recent lab results (10/12/2020) of CBC is as follows: all values are WNL except for WBC at 42.1K, RDW at 15.7, PLT at 875K, Neutro Abs at 25.3K, Lymphs Abs at 6.3K, Mono Abs at 1.3K, Baso Abs at 2.1K, nRBC at 7, Glucose at 274.  On review of systems, pt reports fatigue, leg swelling and denies bone pain, abnormal/excessive bleeding, low appetite, unexpected weight loss, back pain, abdominal pain and any other symptoms.   On PMHx the pt reports Diabetes, D&C, Cataract Surgery, Glaucoma, HTN, HLD, Eczema. On Social Hx the pt reports that she is a non-smoker and does not drink much alcohol. Pt currently works in the [employer] office at [employer]. On Family Hx the pt reports that her sister had  Breast Cancer.  INTERVAL HISTORY:   Erica [name] is here for continued evaluation and management of her CML. She notes that she has been doing well and has no acute new clinical symptoms or concerns. She has been tolerating her Sprycel well.  No chest pain or shortness of breath.  Minimal leg swelling bilaterally. She notes complete compliance with her medication and no missed doses. She notes she enjoyed her Christmas vacation. Labs done 11/29/2021 show stable CBC with a hemoglobin of 11.2 with normal WBC count and platelets CMP within normal limits BCR ABL PCR profile b2a2 transcript at 0.4143% and b3a2 at 0.4143%.  Not yet in major molecular remission. (Down from previous levels of 0.662% respectively). Patient notes no other acute new symptoms.  Follow-up  MEDICAL HISTORY:  Past Medical History:  Diagnosis Date   Bilateral swelling of feet    Cancer (HCC)    Cataract    Diabetes mellitus without complication (HCC)    diet controlled   Eczema    Elevated cholesterol    GERD (gastroesophageal reflux disease)    Glaucoma    Hypertension    Mitral valve prolapse    Simple endometrial hyperplasia without atypia 07/2016   In endometrial polyp with surrounding endometrium inactive recommend follow up ultrasound for endometrial echo 1 year   Spondylisthesis     SURGICAL HISTORY: Past Surgical History:  Procedure Laterality Date   CESAREAN SECTION     DILATATION & CURETTAGE/HYSTEROSCOPY WITH  MYOSURE N/A 08/05/2016   Procedure: [place];  Surgeon: Anastasio Auerbach, MD;  Location: [facility];  Service: Gynecology;  Laterality: N/A;   EXCISION OF SKIN TAG Left 08/05/2016   Procedure: EXCISION OF SKIN TAG;  Surgeon: Anastasio Auerbach, MD;  Location: [facility];  Service: Gynecology;  Laterality: Left;   MOUTH SURGERY     spinal tap      SOCIAL HISTORY: Social History   Socioeconomic History   Marital status: Divorced    Spouse name: Not on  file   Number of children: Not on file   Years of education: Not on file   Highest education level: Not on file  Occupational History   Not on file  Tobacco Use   Smoking status: Never   Smokeless tobacco: Never  Vaping Use   Vaping Use: Never used  Substance and Sexual Activity   Alcohol use: No    Alcohol/week: 0.0 standard drinks   Drug use: No   Sexual activity: Not Currently    Birth control/protection: Post-menopausal    Comment: 1st intercourse 76 yo-Fewer than 5 partners  Other Topics Concern   Not on file  Social History Narrative   Not on file   Social Determinants of Health   Financial Resource Strain: Not on file  Food Insecurity: Not on file  Transportation Needs: Not on file  Physical Activity: Not on file  Stress: Not on file  Social Connections: Not on file  Intimate Partner Violence: Not on file    FAMILY HISTORY: Family History  Problem Relation Age of Onset   Heart disease Mother    Heart disease Father    Breast cancer Sister 32    ALLERGIES:  is allergic to penicillins and sulfa antibiotics.  MEDICATIONS:  Current Outpatient Medications  Medication Sig Dispense Refill   aspirin EC 81 MG tablet Take 1 tablet (81 mg total) by mouth daily. Swallow whole. 30 tablet 1   dasatinib (SPRYCEL) 100 MG tablet TAKE 1 TABLET (100 MG TOTAL) BY MOUTH DAILY. 30 tablet 11   furosemide (LASIX) 20 MG tablet Take 20 mg by mouth daily.     gabapentin (NEURONTIN) 100 MG capsule Take 100 mg by mouth 3 (three) times daily.     latanoprost (XALATAN) 0.005 % ophthalmic solution Place 1 drop into both eyes at bedtime.      loratadine (CLARITIN) 10 MG tablet Take 10 mg by mouth daily as needed for allergies.     metFORMIN (GLUCOPHAGE) 500 MG tablet Take by mouth 2 (two) times daily with a meal.     propranolol (INDERAL) 10 MG tablet Take 10 mg by mouth 3 (three) times daily.     traMADol (ULTRAM) 50 MG tablet Take 50 mg by mouth every 6 (six) hours as needed for  moderate pain.      triamcinolone cream (KENALOG) 0.1 % Apply 1 application topically 2 (two) times daily. Reported on 06/06/2016     triamterene-hydrochlorothiazide (DYAZIDE) 37.5-25 MG capsule Take 1 capsule by mouth daily.     No current facility-administered medications for this visit.    REVIEW OF SYSTEMS:   .10 Point review of Systems was done is negative except as noted above.  PHYSICAL EXAMINATION: ECOG PERFORMANCE STATUS: 1 - Symptomatic but completely ambulatory  . Vitals:   12/13/21 1130  BP: (!) 150/73  Pulse: 71  Resp: 18  Temp: (!) 97.3 F (36.3 C)  SpO2: 100%    Filed Weights  12/13/21 1130  Weight: 186 lb 9.6 oz (84.6 kg)    .Body mass index is 37.69 kg/m.   Marland Kitchen GENERAL:alert, in no acute distress and comfortable SKIN: no acute rashes, no significant lesions EYES: conjunctiva are pink and non-injected, sclera anicteric OROPHARYNX: MMM, no exudates, no oropharyngeal erythema or ulceration NECK: supple, no JVD LYMPH:  no palpable lymphadenopathy in the cervical, axillary or inguinal regions LUNGS: clear to auscultation b/l with normal respiratory effort HEART: regular rate & rhythm ABDOMEN:  normoactive bowel sounds , non tender, not distended. Extremity: no pedal edema PSYCH: alert & oriented x 3 with fluent speech NEURO: no focal motor/sensory deficits    LABORATORY DATA:  I have reviewed the data as listed  . CBC Latest Ref Rng & Units 11/29/2021 11/02/2021 10/25/2021  WBC 4.0 - 10.5 K/uL 7.0 7.9 10.0  Hemoglobin 12.0 - 15.0 g/dL 11.2(L) 13.0 13.7  Hematocrit 36.0 - 46.0 % 36.2 39.4 42.4  Platelets 150 - 400 K/uL 281 272 152    . CMP Latest Ref Rng & Units 11/29/2021 11/02/2021 10/25/2021  Glucose 70 - 99 mg/dL 122(H) 141(H) 156(H)  BUN 8 - 23 mg/dL 18 8 11   Creatinine 0.44 - 1.00 mg/dL 0.87 0.73 0.92  Sodium 135 - 145 mmol/L 139 134(L) 132(L)  Potassium 3.5 - 5.1 mmol/L 4.0 4.2 3.2(L)  Chloride 98 - 111 mmol/L 103 99 96(L)  CO2 22 - 32  mmol/L 30 26 26   Calcium 8.9 - 10.3 mg/dL 9.3 8.8(L) 8.8(L)  Total Protein 6.5 - 8.1 g/dL 6.9 7.3 7.5  Total Bilirubin 0.3 - 1.2 mg/dL 0.4 0.6 0.8  Alkaline Phos 38 - 126 U/L 78 48 49  AST 15 - 41 U/L 26 31 34  ALT 0 - 44 U/L 24 23 27      10/24/2020 Quantitative BCR/ABL:   10/24/2020 FISH - BCR/ABL:   10/24/2020 JAK2, MPL, & CALR sequencing:   10/24/2020 Flow Pathology Report [id number]):   04/17/2021 BCR-ABL    RADIOGRAPHIC STUDIES: I have personally reviewed the radiological images as listed and agreed with the findings in the report. No results found.  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 Previous discussion about BM Bx led to the patient not wanting to engage in any treatment or f/u considerations.  PLAN: -Discussed labs done 11/29/2021 show stable CBC with a hemoglobin of 11.2 with normal WBC count and platelets CMP within normal limits BCR ABL PCR profile b2a2 transcript at 0.4143% and b3a2 at 0.4143%.  Not yet in major molecular remission. (Down from previous levels of 0.662% respectively). -Confirmed that patient does note complete compliance with the medication and taking it appropriately.  No new acid suppressants or PPIs. -Recommended pt walk 20-30 minutes each day and stay active to help with treatment related fatigue. -Advised pt to continue to monitor and limit her salt intake. -Continue ASA, Lasix.   -No significant toxicities from Sprycel at this time.  Continue Dasatinib 100 mg p.o. daily as instructed.   FOLLOW UP: Labs in 6 weeks RTC with Dr Irene Limbo in 8 weeks  All of the patient's questions were answered with apparent satisfaction. The patient knows to call the clinic with any  problems, questions or concerns.    Sullivan Lone MD MS  AAHIVMS [facility] Hematology/Oncology Physician [facility]

## 2021-12-23 ENCOUNTER — Other Ambulatory Visit (HOSPITAL_COMMUNITY): Payer: Self-pay

## 2021-12-23 MED FILL — Dasatinib Tab 100 MG: ORAL | 30 days supply | Qty: 30 | Fill #10 | Status: AC

## 2021-12-25 ENCOUNTER — Other Ambulatory Visit (HOSPITAL_COMMUNITY): Payer: Self-pay

## 2022-01-17 ENCOUNTER — Other Ambulatory Visit (HOSPITAL_COMMUNITY): Payer: Self-pay

## 2022-01-20 ENCOUNTER — Other Ambulatory Visit (HOSPITAL_COMMUNITY): Payer: Self-pay

## 2022-01-20 MED FILL — Dasatinib Tab 100 MG: ORAL | 30 days supply | Qty: 30 | Fill #11 | Status: AC

## 2022-01-23 ENCOUNTER — Ambulatory Visit (INDEPENDENT_AMBULATORY_CARE_PROVIDER_SITE_OTHER): Payer: BC Managed Care – PPO | Admitting: Orthopedic Surgery

## 2022-01-23 ENCOUNTER — Encounter: Payer: Self-pay | Admitting: Orthopedic Surgery

## 2022-01-23 ENCOUNTER — Other Ambulatory Visit (HOSPITAL_COMMUNITY): Payer: Self-pay

## 2022-01-23 DIAGNOSIS — I878 Other specified disorders of veins: Secondary | ICD-10-CM

## 2022-01-23 NOTE — Progress Notes (Signed)
? ?Office Visit Note ?  ?Patient: Erica [name]           ?Date of Birth: July 29, 1946           ?MRN: [id number] ?Visit Date: 01/23/2022 ?             ?Requested by: Shirline Frees, MD ?[address] ?[address] ?[place],  [place] [address] ?PCP: Shirline Frees, MD ? ?Chief Complaint  ?Patient presents with  ? Right Leg - Follow-up  ? ? ? ? ?HPI: ?Patient is a 76 year old woman with venous insufficiency both lower extremities patient is having difficulty wearing her compression socks.  She has also had a recurrent callus right great toe MTP joint. ? ?Patient has been under treatment for leukemia. ? ?Assessment & Plan: ?Visit Diagnoses:  ?1. Venous stasis of both lower extremities   ? ? ?Plan: Recommended patient try [employer] to obtain a compression sock [place]. ? ?Follow-Up Instructions: Return in about 3 months (around 04/25/2022).  ? ?Ortho Exam ? ?Patient is alert, oriented, no adenopathy, well-dressed, normal affect, normal respiratory effort. ?Examination patient has pes planus bilaterally with dorsiflexion to neutral bilaterally.  She has good palpable pulses she does have varicose veins with swelling but no open ulcers.  She has a callus over the medial border of the right great toe and this was pared with a 10 blade knife. ? ?Imaging: ?No results found. ?No images are attached to the encounter. ? ?Labs: ?Lab Results  ?Component Value Date  ? LABURIC 2.8 02/15/2021  ? LABURIC 2.6 12/17/2020  ? LABURIC 2.0 (L) 11/28/2020  ? ? ? ?Lab Results  ?Component Value Date  ? ALBUMIN 3.8 11/29/2021  ? ALBUMIN 3.9 11/02/2021  ? ALBUMIN 4.5 10/25/2021  ? ? ?No results found for: MG ?No results found for: VD25OH ? ?No results found for: PREALBUMIN ?CBC EXTENDED Latest Ref Rng & Units 11/29/2021 11/02/2021 10/25/2021  ?WBC 4.0 - 10.5 K/uL 7.0 7.9 10.0  ?RBC 3.87 - 5.11 MIL/uL 4.10 4.53 4.75  ?HGB 12.0 - 15.0 g/dL 11.2(L) 13.0 13.7  ?HCT 36.0 - 46.0 % 36.2 39.4 42.4  ?PLT 150 - 400 K/uL 281 272 152  ?NEUTROABS 1.7 - 7.7 K/uL 3.0  4.5 -  ?LYMPHSABS 0.7 - 4.0 K/uL 3.3 2.5 -  ? ? ? ?There is no height or weight on file to calculate BMI. ? ?Orders:  ?No orders of the defined types were placed in this encounter. ? ?No orders of the defined types were placed in this encounter. ? ? ? Procedures: ?No procedures performed ? ?Clinical Data: ?No additional findings. ? ?ROS: ? ?All other systems negative, except as noted in the HPI. ?Review of Systems ? ?Objective: ?Vital Signs: There were no vitals taken for this visit. ? ?Specialty Comments:  ?No specialty comments available. ? ?PMFS History: ?There are no problems to display for this patient. ? ?Past Medical History:  ?Diagnosis Date  ? Bilateral swelling of feet   ? Cancer [facility])   ? Cataract   ? Diabetes mellitus without complication ([place])   ? diet controlled  ? Eczema   ? Elevated cholesterol   ? GERD (gastroesophageal reflux disease)   ? Glaucoma   ? Hypertension   ? Mitral valve prolapse   ? Simple endometrial hyperplasia without atypia 07/2016  ? In endometrial polyp with surrounding endometrium inactive recommend follow up ultrasound for endometrial echo 1 year  ? Spondylisthesis   ?  ?Family History  ?Problem Relation Age of Onset  ?  Heart disease Mother   ? Heart disease Father   ? Breast cancer Sister 42  ?  ?Past Surgical History:  ?Procedure Laterality Date  ? CESAREAN SECTION    ? DILATATION & CURETTAGE/HYSTEROSCOPY WITH MYOSURE N/A 08/05/2016  ? Procedure: [place];  Surgeon: Anastasio Auerbach, MD;  Location: [facility];  Service: Gynecology;  Laterality: N/A;  ? EXCISION OF SKIN TAG Left 08/05/2016  ? Procedure: EXCISION OF SKIN TAG;  Surgeon: Anastasio Auerbach, MD;  Location: [facility];  Service: Gynecology;  Laterality: Left;  ? MOUTH SURGERY    ? spinal tap    ? ?Social History  ? ?Occupational History  ? Not on file  ?Tobacco Use  ? Smoking status: Never  ? Smokeless tobacco: Never  ?Vaping Use  ? Vaping Use: Never used  ?Substance and Sexual  Activity  ? Alcohol use: No  ?  Alcohol/week: 0.0 standard drinks  ? Drug use: No  ? Sexual activity: Not Currently  ?  Birth control/protection: Post-menopausal  ?  Comment: 1st intercourse 76 yo-Fewer than 5 partners  ? ? ? ? ? ?

## 2022-01-24 ENCOUNTER — Other Ambulatory Visit: Payer: Self-pay

## 2022-01-24 ENCOUNTER — Inpatient Hospital Stay: Payer: BC Managed Care – PPO | Attending: Hematology

## 2022-01-24 DIAGNOSIS — C9210 Chronic myeloid leukemia, BCR/ABL-positive, not having achieved remission: Secondary | ICD-10-CM | POA: Diagnosis present

## 2022-01-24 LAB — CBC WITH DIFFERENTIAL/PLATELET
Abs Immature Granulocytes: 0.01 10*3/uL (ref 0.00–0.07)
Basophils Absolute: 0.1 10*3/uL (ref 0.0–0.1)
Basophils Relative: 1 %
Eosinophils Absolute: 0.1 10*3/uL (ref 0.0–0.5)
Eosinophils Relative: 1 %
HCT: 39.1 % (ref 36.0–46.0)
Hemoglobin: 12.4 g/dL (ref 12.0–15.0)
Immature Granulocytes: 0 %
Lymphocytes Relative: 44 %
Lymphs Abs: 2.4 10*3/uL (ref 0.7–4.0)
MCH: 27.6 pg (ref 26.0–34.0)
MCHC: 31.7 g/dL (ref 30.0–36.0)
MCV: 87.1 fL (ref 80.0–100.0)
Monocytes Absolute: 0.5 10*3/uL (ref 0.1–1.0)
Monocytes Relative: 10 %
Neutro Abs: 2.3 10*3/uL (ref 1.7–7.7)
Neutrophils Relative %: 44 %
Platelets: 187 10*3/uL (ref 150–400)
RBC: 4.49 MIL/uL (ref 3.87–5.11)
RDW: 15.2 % (ref 11.5–15.5)
WBC: 5.3 10*3/uL (ref 4.0–10.5)
nRBC: 0 % (ref 0.0–0.2)

## 2022-01-24 LAB — CMP (CANCER CENTER ONLY)
ALT: 20 U/L (ref 0–44)
AST: 22 U/L (ref 15–41)
Albumin: 4.1 g/dL (ref 3.5–5.0)
Alkaline Phosphatase: 72 U/L (ref 38–126)
Anion gap: 6 (ref 5–15)
BUN: 19 mg/dL (ref 8–23)
CO2: 26 mmol/L (ref 22–32)
Calcium: 9.3 mg/dL (ref 8.9–10.3)
Chloride: 107 mmol/L (ref 98–111)
Creatinine: 0.87 mg/dL (ref 0.44–1.00)
GFR, Estimated: >60 mL/min (ref >60)
Glucose, Bld: 170 mg/dL — ABNORMAL HIGH (ref 70–99)
Potassium: 4 mmol/L (ref 3.5–5.1)
Sodium: 139 mmol/L (ref 135–145)
Total Bilirubin: 0.5 mg/dL (ref 0.3–1.2)
Total Protein: 6.9 g/dL (ref 6.5–8.1)

## 2022-01-30 LAB — BCR/ABL

## 2022-02-07 ENCOUNTER — Other Ambulatory Visit: Payer: Self-pay

## 2022-02-07 ENCOUNTER — Inpatient Hospital Stay (HOSPITAL_BASED_OUTPATIENT_CLINIC_OR_DEPARTMENT_OTHER): Payer: BC Managed Care – PPO | Admitting: Hematology

## 2022-02-07 VITALS — BP 137/62 | HR 98 | Temp 97.9°F | Resp 18 | Wt 187.2 lb

## 2022-02-07 DIAGNOSIS — C9210 Chronic myeloid leukemia, BCR/ABL-positive, not having achieved remission: Secondary | ICD-10-CM

## 2022-02-11 ENCOUNTER — Telehealth: Payer: Self-pay | Admitting: Hematology

## 2022-02-11 NOTE — Telephone Encounter (Signed)
Scheduled follow-up appointments per 3/17 los. Patient is aware. ?

## 2022-02-13 ENCOUNTER — Other Ambulatory Visit (HOSPITAL_COMMUNITY): Payer: Self-pay

## 2022-02-14 NOTE — Progress Notes (Signed)
? ? ?HEMATOLOGY/ONCOLOGY CLINIC NOTE ? ?Date of Service: .02/07/2022 ? ? ?Patient Care Team: ?Erica Frees, MD as PCP - General (Family Medicine) ? 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 ?HISTORY OF PRESENTING ILLNESS:  ? ?Erica [name] is a wonderful 76 y.o. female who has been referred to [place] by Dr. Kenton Kingfisher for evaluation and management of leukocytosis and thrombocytosis. The pt reports that she is doing well overall.  ? ?The pt reports that her labs in January of 2021 were normal. Her most recent labs was the first time that she was told of her leukocytosis and thrombocytosis. Pt notes that she has been more fatigued over the last month. Pt hit her leg in August and has had consistent leg swelling since then. She is currently on Lasix for leg swelling. Pt does not feel that her Diabetes has been stable as she has been eating too many sugary foods and was told to work on weight loss. Her Optometrist saw something of concern during her eye exam, which prompted him to order the 10/12/20 labs. Pt does not think that she was diagnosed with Diabetic Retinopathy. She has Glaucoma and avoids steroid use for that reason. Pt notes that Penicilin and Sulfa drugs cause rash. ? ?Most recent lab results (10/12/2020) of CBC is as follows: all values are WNL except for WBC at 42.1K, RDW at 15.7, PLT at 875K, Neutro Abs at 25.3K, Lymphs Abs at 6.3K, Mono Abs at 1.3K, Baso Abs at 2.1K, nRBC at 7, Glucose at 274. ? ?On review of systems, pt reports fatigue, leg swelling and denies bone pain, abnormal/excessive bleeding, low appetite, unexpected weight loss, back pain, abdominal pain and any other symptoms.  ? ?On PMHx the pt reports Diabetes, D&C, Cataract Surgery, Glaucoma, HTN, HLD, Eczema. ?On Social Hx the pt reports that she is a non-smoker and does not drink much alcohol. Pt currently works in the [employer] office at [employer]. ?On Family Hx the pt reports that  her sister had Breast Cancer. ? ?INTERVAL HISTORY:  ? ?Erica [name] is here for continued valuation and management of CML . ?Patient notes no new symptoms since her last clinic visit. ?Patient notes no significant new toxicities from the patient's use of Sprycel at 100 mg p.o. daily.  No leg swelling.  No significant diarrhea.  No shortness of breath or chest pain.  No new rashes. ?Labs on 01/24/2022 showed ?Normal CBC ?Unremarkable CMP except blood sugar of 170 ?BCR-ABL increased to 1.5% ?BCR-ABL kinase domain mutation studies pending. ? ? ?MEDICAL HISTORY:  ?Past Medical History:  ?Diagnosis Date  ? Bilateral swelling of feet   ? Cancer [facility])   ? Cataract   ? Diabetes mellitus without complication ([place])   ? diet controlled  ? Eczema   ? Elevated cholesterol   ? GERD (gastroesophageal reflux disease)   ? Glaucoma   ? Hypertension   ? Mitral valve prolapse   ? Simple endometrial hyperplasia without atypia 07/2016  ? In endometrial polyp with surrounding endometrium inactive recommend follow up ultrasound for endometrial echo 1 year  ? Spondylisthesis   ? ? ?SURGICAL HISTORY: ?Past Surgical History:  ?Procedure Laterality Date  ? CESAREAN SECTION    ? DILATATION & CURETTAGE/HYSTEROSCOPY WITH MYOSURE N/A 08/05/2016  ? Procedure: [place];  Surgeon: Anastasio Auerbach, MD;  Location: [facility];  Service: Gynecology;  Laterality: N/A;  ? EXCISION OF SKIN TAG Left 08/05/2016  ? Procedure:  EXCISION OF SKIN TAG;  Surgeon: Anastasio Auerbach, MD;  Location: [facility];  Service: Gynecology;  Laterality: Left;  ? MOUTH SURGERY    ? spinal tap    ? ? ?SOCIAL HISTORY: ?Social History  ? ?Socioeconomic History  ? Marital status: Divorced  ?  Spouse name: Not on file  ? Number of children: Not on file  ? Years of education: Not on file  ? Highest education level: Not on file  ?Occupational History  ? Not on file  ?Tobacco Use  ? Smoking status: Never  ? Smokeless tobacco: Never  ?Vaping Use  ?  Vaping Use: Never used  ?Substance and Sexual Activity  ? Alcohol use: No  ?  Alcohol/week: 0.0 standard drinks  ? Drug use: No  ? Sexual activity: Not Currently  ?  Birth control/protection: Post-menopausal  ?  Comment: 1st intercourse 76 yo-Fewer than 5 partners  ?Other Topics Concern  ? Not on file  ?Social History Narrative  ? Not on file  ? ?Social Determinants of Health  ? ?Financial Resource Strain: Not on file  ?Food Insecurity: Not on file  ?Transportation Needs: Not on file  ?Physical Activity: Not on file  ?Stress: Not on file  ?Social Connections: Not on file  ?Intimate Partner Violence: Not on file  ? ? ?FAMILY HISTORY: ?Family History  ?Problem Relation Age of Onset  ? Heart disease Mother   ? Heart disease Father   ? Breast cancer Sister 44  ? ? ?ALLERGIES:  is allergic to penicillins and sulfa antibiotics. ? ?MEDICATIONS:  ?Current Outpatient Medications  ?Medication Sig Dispense Refill  ? aspirin EC 81 MG tablet Take 1 tablet (81 mg total) by mouth daily. Swallow whole. 30 tablet 1  ? dasatinib (SPRYCEL) 100 MG tablet TAKE 1 TABLET (100 MG TOTAL) BY MOUTH DAILY. 30 tablet 11  ? furosemide (LASIX) 20 MG tablet Take 20 mg by mouth daily.    ? gabapentin (NEURONTIN) 100 MG capsule Take 100 mg by mouth 3 (three) times daily.    ? latanoprost (XALATAN) 0.005 % ophthalmic solution Place 1 drop into both eyes at bedtime.     ? loratadine (CLARITIN) 10 MG tablet Take 10 mg by mouth daily as needed for allergies.    ? metFORMIN (GLUCOPHAGE) 500 MG tablet Take by mouth 2 (two) times daily with a meal.    ? propranolol (INDERAL) 10 MG tablet Take 10 mg by mouth 3 (three) times daily.    ? traMADol (ULTRAM) 50 MG tablet Take 50 mg by mouth every 6 (six) hours as needed for moderate pain.     ? triamcinolone cream (KENALOG) 0.1 % Apply 1 application topically 2 (two) times daily. Reported on 06/06/2016    ? triamterene-hydrochlorothiazide (DYAZIDE) 37.5-25 MG capsule Take 1 capsule by mouth daily.    ? ?No  current facility-administered medications for this visit.  ? ? ?REVIEW OF SYSTEMS:   ?.10 Point review of Systems was done is negative except as noted above. ? ?PHYSICAL EXAMINATION: ?ECOG PERFORMANCE STATUS: 1 - Symptomatic but completely ambulatory ? ?. ?Vitals:  ? 02/07/22 1054  ?BP: 137/62  ?Pulse: 98  ?Resp: 18  ?Temp: 97.9 ?F (36.6 ?C)  ?SpO2: 99%  ? ? ?Filed Weights  ? 02/07/22 1054  ?Weight: 187 lb 3.2 oz (84.9 kg)  ? ? ?.Body mass index is 37.81 kg/m?.  ? ?. ?GENERAL:alert, in no acute distress and comfortable ?SKIN: no acute rashes, no significant lesions ?EYES: conjunctiva are pink  and non-injected, sclera anicteric ?OROPHARYNX: MMM, no exudates, no oropharyngeal erythema or ulceration ?NECK: supple, no JVD ?LYMPH:  no palpable lymphadenopathy in the cervical, axillary or inguinal regions ?LUNGS: clear to auscultation b/l with normal respiratory effort ?HEART: regular rate & rhythm ?ABDOMEN:  normoactive bowel sounds , non tender, not distended. ?Extremity: no pedal edema ?PSYCH: alert & oriented x 3 with fluent speech ?NEURO: no focal motor/sensory deficits ? ? ? ?LABORATORY DATA:  ?I have reviewed the data as listed ? ?. ? ?  Latest Ref Rng & Units 01/24/2022  ? 10:33 AM 11/29/2021  ? 11:40 AM 11/02/2021  ?  5:00 PM  ?CBC  ?WBC 4.0 - 10.5 K/uL 5.3   7.0   7.9    ?Hemoglobin 12.0 - 15.0 g/dL 12.4   11.2   13.0    ?Hematocrit 36.0 - 46.0 % 39.1   36.2   39.4    ?Platelets 150 - 400 K/uL 187   281   272    ? ? ?. ? ?  Latest Ref Rng & Units 01/24/2022  ? 10:33 AM 11/29/2021  ? 11:40 AM 11/02/2021  ?  5:00 PM  ?CMP  ?Glucose 70 - 99 mg/dL 170   122   141    ?BUN 8 - 23 mg/dL '19   18   8    '$ ?Creatinine 0.44 - 1.00 mg/dL 0.87   0.87   0.73    ?Sodium 135 - 145 mmol/L 139   139   134    ?Potassium 3.5 - 5.1 mmol/L 4.0   4.0   4.2    ?Chloride 98 - 111 mmol/L 107   103   99    ?CO2 22 - 32 mmol/L '26   30   26    '$ ?Calcium 8.9 - 10.3 mg/dL 9.3   9.3   8.8    ?Total Protein 6.5 - 8.1 g/dL 6.9   6.9   7.3    ?Total  Bilirubin 0.3 - 1.2 mg/dL 0.5   0.4   0.6    ?Alkaline Phos 38 - 126 U/L 72   78   48    ?AST 15 - 41 U/L '22   26   31    '$ ?ALT 0 - 44 U/L '20   24   23    '$ ? ? ? ?10/24/2020 Quantitative BCR/ABL: ? ? ?10/24/2020 FISH

## 2022-02-17 ENCOUNTER — Other Ambulatory Visit: Payer: Self-pay | Admitting: Hematology

## 2022-02-17 ENCOUNTER — Other Ambulatory Visit (HOSPITAL_COMMUNITY): Payer: Self-pay

## 2022-02-17 MED ORDER — DASATINIB 100 MG PO TABS
ORAL_TABLET | Freq: Every day | ORAL | 11 refills | Status: DC
  Filled 2022-02-17: qty 30, 30d supply, fill #0
  Filled 2022-03-13: qty 30, 30d supply, fill #1

## 2022-02-18 LAB — BCR-ABL1 KINASE DOMAIN MUTATION ANALYSIS

## 2022-02-24 ENCOUNTER — Other Ambulatory Visit (HOSPITAL_COMMUNITY): Payer: Self-pay

## 2022-03-06 ENCOUNTER — Other Ambulatory Visit: Payer: Self-pay

## 2022-03-06 DIAGNOSIS — C9210 Chronic myeloid leukemia, BCR/ABL-positive, not having achieved remission: Secondary | ICD-10-CM

## 2022-03-07 ENCOUNTER — Inpatient Hospital Stay: Payer: BC Managed Care – PPO | Attending: Hematology

## 2022-03-07 ENCOUNTER — Other Ambulatory Visit: Payer: Self-pay

## 2022-03-07 DIAGNOSIS — Z8249 Family history of ischemic heart disease and other diseases of the circulatory system: Secondary | ICD-10-CM | POA: Diagnosis not present

## 2022-03-07 DIAGNOSIS — C9210 Chronic myeloid leukemia, BCR/ABL-positive, not having achieved remission: Secondary | ICD-10-CM | POA: Insufficient documentation

## 2022-03-07 DIAGNOSIS — Z803 Family history of malignant neoplasm of breast: Secondary | ICD-10-CM | POA: Insufficient documentation

## 2022-03-07 DIAGNOSIS — Z79899 Other long term (current) drug therapy: Secondary | ICD-10-CM | POA: Diagnosis not present

## 2022-03-07 LAB — CMP (CANCER CENTER ONLY)
ALT: 37 U/L (ref 0–44)
AST: 34 U/L (ref 15–41)
Albumin: 3.9 g/dL (ref 3.5–5.0)
Alkaline Phosphatase: 70 U/L (ref 38–126)
Anion gap: 6 (ref 5–15)
BUN: 12 mg/dL (ref 8–23)
CO2: 27 mmol/L (ref 22–32)
Calcium: 9.1 mg/dL (ref 8.9–10.3)
Chloride: 105 mmol/L (ref 98–111)
Creatinine: 0.92 mg/dL (ref 0.44–1.00)
GFR, Estimated: >60 mL/min (ref >60)
Glucose, Bld: 226 mg/dL — ABNORMAL HIGH (ref 70–99)
Potassium: 3.9 mmol/L (ref 3.5–5.1)
Sodium: 138 mmol/L (ref 135–145)
Total Bilirubin: 0.4 mg/dL (ref 0.3–1.2)
Total Protein: 6.8 g/dL (ref 6.5–8.1)

## 2022-03-07 LAB — CBC WITH DIFFERENTIAL (CANCER CENTER ONLY)
Abs Immature Granulocytes: 0.01 10*3/uL (ref 0.00–0.07)
Basophils Absolute: 0.1 10*3/uL (ref 0.0–0.1)
Basophils Relative: 1 %
Eosinophils Absolute: 0.1 10*3/uL (ref 0.0–0.5)
Eosinophils Relative: 1 %
HCT: 38.6 % (ref 36.0–46.0)
Hemoglobin: 12.6 g/dL (ref 12.0–15.0)
Immature Granulocytes: 0 %
Lymphocytes Relative: 36 %
Lymphs Abs: 1.9 10*3/uL (ref 0.7–4.0)
MCH: 28.2 pg (ref 26.0–34.0)
MCHC: 32.6 g/dL (ref 30.0–36.0)
MCV: 86.4 fL (ref 80.0–100.0)
Monocytes Absolute: 0.3 10*3/uL (ref 0.1–1.0)
Monocytes Relative: 6 %
Neutro Abs: 3 10*3/uL (ref 1.7–7.7)
Neutrophils Relative %: 56 %
Platelet Count: 193 10*3/uL (ref 150–400)
RBC: 4.47 MIL/uL (ref 3.87–5.11)
RDW: 14.7 % (ref 11.5–15.5)
WBC Count: 5.4 10*3/uL (ref 4.0–10.5)
nRBC: 0 % (ref 0.0–0.2)

## 2022-03-11 ENCOUNTER — Other Ambulatory Visit (HOSPITAL_COMMUNITY): Payer: Self-pay

## 2022-03-13 ENCOUNTER — Other Ambulatory Visit (HOSPITAL_COMMUNITY): Payer: Self-pay

## 2022-03-18 LAB — BCR/ABL

## 2022-03-19 ENCOUNTER — Other Ambulatory Visit (HOSPITAL_COMMUNITY): Payer: Self-pay

## 2022-03-21 ENCOUNTER — Other Ambulatory Visit (HOSPITAL_COMMUNITY): Payer: Self-pay

## 2022-03-21 ENCOUNTER — Other Ambulatory Visit: Payer: Self-pay

## 2022-03-21 ENCOUNTER — Inpatient Hospital Stay (HOSPITAL_BASED_OUTPATIENT_CLINIC_OR_DEPARTMENT_OTHER): Payer: BC Managed Care – PPO | Admitting: Hematology

## 2022-03-21 VITALS — BP 144/63 | HR 68 | Temp 97.7°F | Resp 18 | Wt 189.6 lb

## 2022-03-21 DIAGNOSIS — C9210 Chronic myeloid leukemia, BCR/ABL-positive, not having achieved remission: Secondary | ICD-10-CM

## 2022-03-21 MED ORDER — ASCIMINIB HCL 40 MG PO TABS
40.0000 mg | ORAL_TABLET | Freq: Two times a day (BID) | ORAL | 0 refills | Status: DC
  Filled 2022-03-21: qty 60, fill #0

## 2022-03-24 ENCOUNTER — Telehealth: Payer: Self-pay

## 2022-03-24 ENCOUNTER — Other Ambulatory Visit (HOSPITAL_COMMUNITY): Payer: Self-pay

## 2022-03-24 NOTE — Telephone Encounter (Signed)
Oral Oncology Patient Advocate Encounter ?  ?Received notification from [facility] that prior authorization for Scemblix is required. ?  ?PA submitted on CoverMyMeds ?Key [id number] ?Status is pending ?  ?Oral Oncology Clinic will continue to follow. ? ?Wynn Maudlin CPHT ?Specialty Pharmacy Patient Advocate ?[place] ?Phone [phone] ?Fax [phone] ?03/24/2022 8:49 AM ? ? ?

## 2022-03-25 ENCOUNTER — Telehealth: Payer: Self-pay | Admitting: Hematology

## 2022-03-25 ENCOUNTER — Telehealth: Payer: Self-pay

## 2022-03-25 ENCOUNTER — Other Ambulatory Visit (HOSPITAL_COMMUNITY): Payer: Self-pay

## 2022-03-25 ENCOUNTER — Telehealth: Payer: Self-pay | Admitting: Pharmacist

## 2022-03-25 DIAGNOSIS — C9210 Chronic myeloid leukemia, BCR/ABL-positive, not having achieved remission: Secondary | ICD-10-CM

## 2022-03-25 MED ORDER — ASCIMINIB HCL 40 MG PO TABS
40.0000 mg | ORAL_TABLET | Freq: Two times a day (BID) | ORAL | 0 refills | Status: DC
  Filled 2022-03-25: qty 60, fill #0

## 2022-03-25 NOTE — Telephone Encounter (Signed)
Oral Oncology Patient Advocate Encounter ?  ?Was successful in obtaining a copay card for Scemblic.  This copay card will make the patients copay $25. ? ?I have spoken with the patient.   ? ?The billing information is as follows and has been shared with [facility] ?  ? ?RxBin: [id number] ?PCN: [id number] ?Member ID: [id number] ?Group ID: [id number] ? ?[employer] pays up to $15,000 max per calendar year. ? ? ?Wynn Maudlin CPHT ?Specialty Pharmacy Patient Advocate ?[place] ?Phone [phone] ?Fax [phone] ?03/25/2022 8:29 AM ? ?

## 2022-03-25 NOTE — Telephone Encounter (Addendum)
Oral Oncology Pharmacist Encounter ? 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 ? ?CBC w/ Diff and CMP from 03/07/22 assessed, noted glucose of 226 mg/dL - Scemblix should not affect glucose levels. Patient BP on 03/21/22 was 144/63 mmHg in office. Recommend continued monitoring of BP as Scemblix may cause increase in BP. Patient on triameterene/HCTZ. MD planning on obtaining repeat baseline CBC/CMP in addition to lipase and amylase prior to starting Scemblix. Prescription dose and frequency assessed - per Dr. Irene Limbo, patient will be started on low dose Scemblix to help improve patient toleration, and then dose-escalated as able to goal dose of 200 mg twice daily. ? ?Current medication list in Epic reviewed, no relevant/significant DDIs with Scemblix identified. ? ?Evaluated chart and no patient barriers to medication adherence noted.  ? ?Prescription has been e-scribed to the [facility] for benefits analysis and approval. ? ?Oral Oncology Clinic will continue to follow for insurance authorization, copayment issues, initial counseling and start date. ? ?Leron Croak, PharmD, BCPS ?Hematology/Oncology Clinical Pharmacist ?[facility] Oral Chemotherapy Navigation Clinics ?[phone] ?03/25/2022 9:38 AM ? ?

## 2022-03-25 NOTE — Telephone Encounter (Signed)
Scheduled follow-up appointment per 4/28 los. Patient is aware. 

## 2022-03-25 NOTE — Telephone Encounter (Signed)
Oral Oncology Patient Advocate Encounter ? ?Prior Authorization for Scemblix has been approved.   ? ?PA# [id number] ?Effective dates: 03/24/22 through 03/25/23 ? ?Patients co-pay is [phone] ? ?Oral Oncology Clinic will continue to follow.  ? ?Wynn Maudlin CPHT ?Specialty Pharmacy Patient Advocate ?[place] ?Phone [phone] ?Fax [phone] ?03/25/2022 8:38 AM ? ?

## 2022-03-26 ENCOUNTER — Other Ambulatory Visit (HOSPITAL_COMMUNITY): Payer: Self-pay

## 2022-03-26 ENCOUNTER — Telehealth: Payer: Self-pay

## 2022-03-26 ENCOUNTER — Other Ambulatory Visit: Payer: Self-pay

## 2022-03-26 DIAGNOSIS — C9210 Chronic myeloid leukemia, BCR/ABL-positive, not having achieved remission: Secondary | ICD-10-CM

## 2022-03-26 NOTE — Telephone Encounter (Signed)
Contacted pt to set up lab work for monitoring when starting new medication Asciminib. Pt states she is not going to take this medication as it can cause issues with her pancreas. Pt very adamant she is not taking. I told pt I will update MD and call her back. Pt acknowledged.  ?

## 2022-03-27 NOTE — Progress Notes (Signed)
? ? ?HEMATOLOGY/ONCOLOGY CLINIC NOTE ? ?Date of Service: .03/21/2022 ? ? ?Patient Care Team: ?Shirline Frees, MD as PCP - General (Family Medicine) ? 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 ? ?HISTORY OF PRESENTING ILLNESS:  ? ?Please see previous note for details on initial presentation ? ?INTERVAL HISTORY:  ? ?Mrs Erica [name] is here for continued evaluation and management of her CML. ?She notes no acute new toxicities from her Dasatinib at this time.  No leg swelling no shortness of breath no chest pain. ?She remains quite anxious about her condition. ?She notes no issues with acute infection no bruising or new fatigue. ?We discussed her labs from 03/07/2022 in details-CBC within normal limits ?CMP stable other than a blood sugar of 226 ?BCR ABL PCR shows progressive loss of molecular control with BCR/ABL 1 IS of 2.7742%. ?Her BCR-ABL kinase domain mutation finally came back for some delays and shows 2 mutations -p.T315I  ?p.[id number]*10 . ?We discussed in detail that the T315I mutation produces resistance to most of the TKI's use for CML treatment. ?We discussed that the Dasatinib would not be useful even if we increase the dose.  It did appear that she had predominant clone that did respond to Dasatinib initially but the mutated clone is going through progressively as expected. ?We discussed with this mutation the choices of treatment are somewhat limited.  We would typically have to consider treatment with asciminib or ponatinib and refer her to a transplant center for consideration of bone marrow transplant.  We discussed that her at her age this may or may not be offered. ?We discussed Asciminib will probably be better tolerated and have less side effects than the ponatinib and that we would start at a lower dose and progressively dose escalate her based on tolerance.  We discussed that the doses of this  admitted for this particular mutation are much higher than the standard doses. ?Patient completely and totally refused any consideration of further work-up with a bone marrow biopsy or referral or consideration of a bone marrow transplant. ?She initially also refused any consideration of asciminib or ponatinib. ?She was focused on the fact that she believes her COVID infection might have created the mutation. ?After extensive discussion of goals of care and her concerns she did decide to proceed with the use of Asciminib. ? ?MEDICAL HISTORY:  ?Past Medical History:  ?Diagnosis Date  ? Bilateral swelling of feet   ? Cancer [facility])   ? Cataract   ? Diabetes mellitus without complication ([place])   ? diet controlled  ? Eczema   ? Elevated cholesterol   ? GERD (gastroesophageal reflux disease)   ? Glaucoma   ? Hypertension   ? Mitral valve prolapse   ? Simple endometrial hyperplasia without atypia 07/2016  ? In endometrial polyp with surrounding endometrium inactive recommend follow up ultrasound for endometrial echo 1 year  ? Spondylisthesis   ? ? ?SURGICAL HISTORY: ?Past Surgical History:  ?Procedure Laterality Date  ? CESAREAN SECTION    ? DILATATION & CURETTAGE/HYSTEROSCOPY WITH MYOSURE N/A 08/05/2016  ? Procedure: [place];  Surgeon: Anastasio Auerbach, MD;  Location: [facility];  Service: Gynecology;  Laterality: N/A;  ? EXCISION OF SKIN TAG Left 08/05/2016  ? Procedure: EXCISION OF SKIN TAG;  Surgeon: Anastasio Auerbach, MD;  Location: [facility];  Service: Gynecology;  Laterality: Left;  ? MOUTH SURGERY    ?  spinal tap    ? ? ?SOCIAL HISTORY: ?Social History  ? ?Socioeconomic History  ? Marital status: Divorced  ?  Spouse name: Not on file  ? Number of children: Not on file  ? Years of education: Not on file  ? Highest education level: Not on file  ?Occupational History  ? Not on file  ?Tobacco Use  ? Smoking status: Never  ? Smokeless tobacco: Never  ?Vaping Use  ? Vaping Use: Never  used  ?Substance and Sexual Activity  ? Alcohol use: No  ?  Alcohol/week: 0.0 standard drinks  ? Drug use: No  ? Sexual activity: Not Currently  ?  Birth control/protection: Post-menopausal  ?  Comment: 1st intercourse 76 yo-Fewer than 5 partners  ?Other Topics Concern  ? Not on file  ?Social History Narrative  ? Not on file  ? ?Social Determinants of Health  ? ?Financial Resource Strain: Not on file  ?Food Insecurity: Not on file  ?Transportation Needs: Not on file  ?Physical Activity: Not on file  ?Stress: Not on file  ?Social Connections: Not on file  ?Intimate Partner Violence: Not on file  ? ? ?FAMILY HISTORY: ?Family History  ?Problem Relation Age of Onset  ? Heart disease Mother   ? Heart disease Father   ? Breast cancer Sister 29  ? ? ?ALLERGIES:  is allergic to penicillins and sulfa antibiotics. ? ?MEDICATIONS:  ?Current Outpatient Medications  ?Medication Sig Dispense Refill  ? asciminib hcl 40 MG TABS Take 40 mg by mouth 2 (two) times daily. Take on an empty stomach at least 2 hours before or 1 hour after food. 60 tablet 0  ? aspirin EC 81 MG tablet Take 1 tablet (81 mg total) by mouth daily. Swallow whole. 30 tablet 1  ? furosemide (LASIX) 20 MG tablet Take 20 mg by mouth daily.    ? gabapentin (NEURONTIN) 100 MG capsule Take 100 mg by mouth 3 (three) times daily.    ? latanoprost (XALATAN) 0.005 % ophthalmic solution Place 1 drop into both eyes at bedtime.     ? loratadine (CLARITIN) 10 MG tablet Take 10 mg by mouth daily as needed for allergies.    ? metFORMIN (GLUCOPHAGE) 500 MG tablet Take by mouth 2 (two) times daily with a meal.    ? propranolol (INDERAL) 10 MG tablet Take 10 mg by mouth 3 (three) times daily.    ? traMADol (ULTRAM) 50 MG tablet Take 50 mg by mouth every 6 (six) hours as needed for moderate pain.     ? triamcinolone cream (KENALOG) 0.1 % Apply 1 application topically 2 (two) times daily. Reported on 06/06/2016    ? triamterene-hydrochlorothiazide (DYAZIDE) 37.5-25 MG capsule Take 1  capsule by mouth daily.    ? ?No current facility-administered medications for this visit.  ? ? ?REVIEW OF SYSTEMS:   ?10 Point review of Systems was done is negative except as noted above. ? ?PHYSICAL EXAMINATION: ?ECOG PERFORMANCE STATUS: 1 - Symptomatic but completely ambulatory ? ?. ?Vitals:  ? 03/21/22 1131  ?BP: (!) 144/63  ?Pulse: 68  ?Resp: 18  ?Temp: 97.7 ?F (36.5 ?C)  ?SpO2: 99%  ? ? ?Filed Weights  ? 03/21/22 1131  ?Weight: 189 lb 9.6 oz (86 kg)  ? ? ?.Body mass index is 38.29 kg/m?Marland Kitchen  ?NAD ?GENERAL:alert, in no acute distress and comfortable ?SKIN: no acute rashes, no significant lesions ?EYES: conjunctiva are pink and non-injected, sclera anicteric ?OROPHARYNX: MMM, no exudates, no oropharyngeal erythema or ulceration ?  NECK: supple, no JVD ?LYMPH:  no palpable lymphadenopathy in the cervical, axillary or inguinal regions ?LUNGS: clear to auscultation b/l with normal respiratory effort ?HEART: regular rate & rhythm ?ABDOMEN:  normoactive bowel sounds , non tender, not distended. ?Extremity: no pedal edema ?PSYCH: alert & oriented x 3 with fluent speech ?NEURO: no focal motor/sensory deficits ? ? ? ?LABORATORY DATA:  ?I have reviewed the data as listed ? ?. ? ?  Latest Ref Rng & Units 03/07/2022  ? 10:43 AM 01/24/2022  ? 10:33 AM 11/29/2021  ? 11:40 AM  ?CBC  ?WBC 4.0 - 10.5 K/uL 5.4   5.3   7.0    ?Hemoglobin 12.0 - 15.0 g/dL 12.6   12.4   11.2    ?Hematocrit 36.0 - 46.0 % 38.6   39.1   36.2    ?Platelets 150 - 400 K/uL 193   187   281    ? ? ?. ? ?  Latest Ref Rng & Units 03/07/2022  ? 10:43 AM 01/24/2022  ? 10:33 AM 11/29/2021  ? 11:40 AM  ?CMP  ?Glucose 70 - 99 mg/dL 226   170   122    ?BUN 8 - 23 mg/dL '12   19   18    ' ?Creatinine 0.44 - 1.00 mg/dL 0.92   0.87   0.87    ?Sodium 135 - 145 mmol/L 138   139   139    ?Potassium 3.5 - 5.1 mmol/L 3.9   4.0   4.0    ?Chloride 98 - 111 mmol/L 105   107   103    ?CO2 22 - 32 mmol/L '27   26   30    ' ?Calcium 8.9 - 10.3 mg/dL 9.1   9.3   9.3    ?Total Protein 6.5 - 8.1 g/dL  6.8   6.9   6.9    ?Total Bilirubin 0.3 - 1.2 mg/dL 0.4   0.5   0.4    ?Alkaline Phos 38 - 126 U/L 70   72   78    ?AST 15 - 41 U/L 34   22   26    ?ALT 0 - 44 U/L 37   20   24    ? ?BCR-ABL1 Kinase doma

## 2022-03-31 NOTE — Telephone Encounter (Signed)
Oral Chemotherapy Pharmacist Encounter  ? ?Spoke with patient today to follow up regarding patient's oral chemotherapy medication: Scemblix (asciminib) ? ?Offered to discuss details regarding medication and initial counseling session. Patient adamant that she will not be taking Scemblix because of concerns of pancreatic effects. When offered to discuss the monitoring of possible pancreatic effects from [facility], patient denied discussion any further.  ? ?Dr. Irene Limbo notified of discussion. Oral chemotherapy clinic will sign off at this time.  ? ?Leron Croak, PharmD, BCPS ?Hematology/Oncology Clinical Pharmacist ?[facility] Oral Chemotherapy Navigation Clinics ?[phone] ?03/31/2022 10:05 AM ? ?

## 2022-04-18 ENCOUNTER — Inpatient Hospital Stay: Payer: BC Managed Care – PPO | Attending: Hematology | Admitting: Hematology

## 2022-04-18 ENCOUNTER — Inpatient Hospital Stay: Payer: BC Managed Care – PPO

## 2022-04-18 ENCOUNTER — Other Ambulatory Visit: Payer: Self-pay

## 2022-04-18 VITALS — BP 152/62 | HR 67 | Temp 97.9°F | Resp 19 | Ht 59.0 in | Wt 191.8 lb

## 2022-04-18 DIAGNOSIS — C9210 Chronic myeloid leukemia, BCR/ABL-positive, not having achieved remission: Secondary | ICD-10-CM

## 2022-04-18 DIAGNOSIS — Z7189 Other specified counseling: Secondary | ICD-10-CM | POA: Diagnosis not present

## 2022-04-24 ENCOUNTER — Telehealth: Payer: Self-pay | Admitting: Hematology

## 2022-04-24 ENCOUNTER — Ambulatory Visit (INDEPENDENT_AMBULATORY_CARE_PROVIDER_SITE_OTHER): Payer: BC Managed Care – PPO | Admitting: Orthopedic Surgery

## 2022-04-24 DIAGNOSIS — I878 Other specified disorders of veins: Secondary | ICD-10-CM

## 2022-04-24 NOTE — Progress Notes (Signed)
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  HISTORY OF PRESENTING ILLNESS:   Please see previous note for details on initial presentation  INTERVAL HISTORY:   Erica [name] is here for continued valuation and management of her CML and discussion of goals of care. 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  She is very concerned with the potential side effects and has refused to start taking this medication. We also discussed other options including ponatinib and omacetaxine and the pros and cons of this treatment as well as toxicities. Patient wants to hold off on all consideration of treatment.  She understands the risk of disease progression. She was also offered option for referral to [facility] for an additional opinion.  She declines at this offer of referral for second opinion at this time but notes that she will think about it. She notes that she will let us know her final decision regarding the second opinion referral as well as whether she would want to proceed with treatment. At this time the patient is not on any CML treatment per her choice. She notes that she is keen to continue the Dasatinib and increase the dose if needed.  We discussed that with her mutation profile this would not be recommended.. My nurse Eustaquio Maize was present as chaperone during this discussion. We discussed that her choice of not treating the CML is not recommended but that it would be her choice.  I also discussed and encouraged her to share her condition and diagnosis with her son who is in [place] so that she can have additional family support in this decision making.  She  notes that she does not want to bother her son with her diagnosis.   MEDICAL HISTORY:  Past Medical History:  Diagnosis Date   Bilateral swelling of feet    Cancer (HCC)    Cataract    Diabetes mellitus without complication (HCC)    diet controlled   Eczema    Elevated cholesterol    GERD (gastroesophageal reflux disease)    Glaucoma    Hypertension    Mitral valve prolapse    Simple endometrial hyperplasia without atypia 07/2016   In endometrial polyp with surrounding endometrium inactive recommend follow up ultrasound for endometrial echo 1 year   Spondylisthesis     SURGICAL HISTORY: Past Surgical History:  Procedure Laterality Date   CESAREAN SECTION     DILATATION & CURETTAGE/HYSTEROSCOPY WITH MYOSURE N/A 08/05/2016   Procedure: [place];  Surgeon: Anastasio Auerbach, MD;  Location: [facility];  Service: Gynecology;  Laterality: N/A;   EXCISION OF SKIN TAG Left 08/05/2016   Procedure: EXCISION OF SKIN TAG;  Surgeon: Anastasio Auerbach, MD;  Location: [facility];  Service: Gynecology;  Laterality: Left;   MOUTH SURGERY     spinal tap      SOCIAL HISTORY: Social History   Socioeconomic History   Marital status: Divorced    Spouse name: Not on file   Number of children: Not on file   Years of education: Not on file   Highest education level: Not on file  Occupational History   Not on file  Tobacco Use  Smoking status: Never   Smokeless tobacco: Never  Vaping Use   Vaping Use: Never used  Substance and Sexual Activity   Alcohol use: No    Alcohol/week: 0.0 standard drinks   Drug use: No   Sexual activity: Not Currently    Birth control/protection: Post-menopausal    Comment: 1st intercourse 76 yo-Fewer than 5 partners  Other Topics Concern   Not on file  Social History Narrative   Not on file   Social Determinants of Health   Financial Resource Strain: Not on file  Food Insecurity: Not on file  Transportation Needs: Not  on file  Physical Activity: Not on file  Stress: Not on file  Social Connections: Not on file  Intimate Partner Violence: Not on file    FAMILY HISTORY: Family History  Problem Relation Age of Onset   Heart disease Mother    Heart disease Father    Breast cancer Sister 14    ALLERGIES:  is allergic to penicillins and sulfa antibiotics.  MEDICATIONS:  Current Outpatient Medications  Medication Sig Dispense Refill   aspirin EC 81 MG tablet Take 1 tablet (81 mg total) by mouth daily. Swallow whole. 30 tablet 1   furosemide (LASIX) 20 MG tablet Take 20 mg by mouth daily.     gabapentin (NEURONTIN) 100 MG capsule Take 100 mg by mouth 3 (three) times daily.     latanoprost (XALATAN) 0.005 % ophthalmic solution Place 1 drop into both eyes at bedtime.      loratadine (CLARITIN) 10 MG tablet Take 10 mg by mouth daily as needed for allergies.     metFORMIN (GLUCOPHAGE) 500 MG tablet Take by mouth 2 (two) times daily with a meal.     propranolol (INDERAL) 10 MG tablet Take 10 mg by mouth 3 (three) times daily.     traMADol (ULTRAM) 50 MG tablet Take 50 mg by mouth every 6 (six) hours as needed for moderate pain.      triamcinolone cream (KENALOG) 0.1 % Apply 1 application topically 2 (two) times daily. Reported on 06/06/2016     triamterene-hydrochlorothiazide (DYAZIDE) 37.5-25 MG capsule Take 1 capsule by mouth daily.     asciminib hcl 40 MG TABS Take 40 mg by mouth 2 (two) times daily. Take on an empty stomach at least 2 hours before or 1 hour after food. (Patient not taking: Reported on 04/18/2022) 60 tablet 0   No current facility-administered medications for this visit.    REVIEW OF SYSTEMS:   10 Point review of Systems was done is negative except as noted above.  PHYSICAL EXAMINATION: ECOG PERFORMANCE STATUS: 1 - Symptomatic but completely ambulatory  . Vitals:   04/18/22 1119  BP: (!) 152/62  Pulse: 67  Resp: 19  Temp: 97.9 F (36.6 C)  SpO2: 100%    Filed Weights    04/18/22 1119  Weight: 191 lb 12.8 oz (87 kg)    .Body mass index is 38.74 kg/m.  NAD GENERAL:alert, in no acute distress and comfortable SKIN: no acute rashes, no significant lesions EYES: conjunctiva are pink and non-injected, sclera anicteric OROPHARYNX: MMM, no exudates, no oropharyngeal erythema or ulceration NECK: supple, no JVD LYMPH:  no palpable lymphadenopathy in the cervical, axillary or inguinal regions LUNGS: clear to auscultation b/l with normal respiratory effort HEART: regular rate & rhythm ABDOMEN:  normoactive bowel sounds , non tender, not distended. Extremity: no pedal edema PSYCH: alert & oriented x 3 with fluent speech NEURO: no focal motor/sensory deficits  LABORATORY DATA:  I have reviewed the data as listed  .    Latest Ref Rng & Units 03/07/2022   10:43 AM 01/24/2022   10:33 AM 11/29/2021   11:40 AM  CBC  WBC 4.0 - 10.5 K/uL 5.4   5.3   7.0    Hemoglobin 12.0 - 15.0 g/dL 12.6   12.4   11.2    Hematocrit 36.0 - 46.0 % 38.6   39.1   36.2    Platelets 150 - 400 K/uL 193   187   281      .    Latest Ref Rng & Units 03/07/2022   10:43 AM 01/24/2022   10:33 AM 11/29/2021   11:40 AM  CMP  Glucose 70 - 99 mg/dL 226   170   122    BUN 8 - 23 mg/dL '12   19   18    '$ Creatinine 0.44 - 1.00 mg/dL 0.92   0.87   0.87    Sodium 135 - 145 mmol/L 138   139   139    Potassium 3.5 - 5.1 mmol/L 3.9   4.0   4.0    Chloride 98 - 111 mmol/L 105   107   103    CO2 22 - 32 mmol/L '27   26   30    '$ Calcium 8.9 - 10.3 mg/dL 9.1   9.3   9.3    Total Protein 6.5 - 8.1 g/dL 6.8   6.9   6.9    Total Bilirubin 0.3 - 1.2 mg/dL 0.4   0.5   0.4    Alkaline Phos 38 - 126 U/L 70   72   78    AST 15 - 41 U/L 34   22   26    ALT 0 - 44 U/L 37   20   24     BCR-ABL1 Kinase domain mutation analysis Order: [id number] Status: Edited Result - FINAL    Visible to patient: No (inaccessible in MyChart)    Next appt: 04/18/2022 at 10:30 AM in Oncology ([facility])    Dx: Chronic  myeloid leukemia, BCR/ABL-pos...    0 Result Notes    Component 2 mo ago  Result: Comment   Comment: (NOTE)  A mutation was detected within the BCR-ABL1 kinase domain.  Two mutations were detected within the BCR-ABL1 kinase domain.   Nucleotide Change: Comment: VC   Comment: (NOTE)  c.944C>T  [id number]   Predicted Amino Acid Change: Comment: VC   Comment: (NOTE)  p.T315I  p.[id number]*10   Interpretation: Comment: VC   Comment: (NOTE)  A nucleotide change (c.944C>T) encoding a threonine to  isoleucine substitution at position 315 (T315I) was detected  within the BCR-ABL1 kinase domain. This mutation is reported  to be associated with resistance to imatinib that does not  respond to higher drug doses. In addition, no kinase  inhibitor has yet been developed that is active against cells  with the T315I mutation.  A nucleotide change ([id number] ) was detected within  the BCR-ABL1 kinase domain. The significance of this mutation  in causing resistance to imatinib mesylate is not known.         10/24/2020 Quantitative BCR/ABL:   10/24/2020 FISH - BCR/ABL:   10/24/2020 JAK2, MPL, & CALR sequencing:   10/24/2020 Flow Pathology Report [id number]):   04/17/2021 BCR-ABL    RADIOGRAPHIC STUDIES: I have personally reviewed the radiological images as listed and agreed with the findings in the  report. No results found.  ASSESSMENT & PLAN:   76 yo with   1) initially diagnosis of likely CML accelerated phase(patient had refused bone marrow examination)  Leucocytosis with myeloid left shift and increased blast 19% on flow cytometry  Thrombocytosis Patient declined BM Bx and other invasive evaluation Significant anxiety issues and chooses to pursue a limited conservative attempt at treatment. Previous discussion about BM Bx led to the patient not wanting to engage in any treatment or f/u considerations. 2) CML progression with new findings of T315I  mutation Patient has lost molecular remission but is still in hematologic remission on her previous Dasatinib. PLAN: -After agreeing during the last clinic visit  patient decided she would not want to initiate asciminib treatment for her CML withT315I mutation. We discussed goals of care in details and the nature of CML and result of her new mutation which significantly changes treatment options. -We cannot continue Dasatinib even at the higher dose since it would not be effective with her mutation profile. -Other backup treatment options would be ponatinib and omacetaxine which might be more toxic than the Asciminib. -Patient still is disinclined to start this medication due to potential side effects.  She is aware of the risk of continued CML progression. -I offered her an option to be referred out for a second opinion to [facility] or [place].  She wants to think about this and will let us know. -Continues to be off any medications at this time for her CML per her preference  FOLLOW UP: Return to clinic with Dr. Irene Limbo in 8 weeks  Labs in 6 weeks  The total time spent in the appointment was 32 minutes*.  All of the patient's questions were answered with apparent satisfaction. The patient knows to call the clinic with any problems, questions or concerns.   Sullivan Lone MD MS AAHIVMS [facility] Hematology/Oncology Physician [facility]  .*Total Encounter Time as defined by the Centers for Medicare and Medicaid Services includes, in addition to the face-to-face time of a patient visit (documented in the note above) non-face-to-face time: obtaining and reviewing outside history, ordering and reviewing medications, tests or procedures, care coordination (communications with other health care professionals or caregivers) and documentation in the medical record.

## 2022-04-24 NOTE — Telephone Encounter (Signed)
Scheduled follow-up appointments per 5/26 los. Patient is aware.

## 2022-04-25 ENCOUNTER — Encounter: Payer: Self-pay | Admitting: Orthopedic Surgery

## 2022-04-25 NOTE — Progress Notes (Signed)
Office Visit Note   Patient: Erica [name]           Date of Birth: [birth year]           MRN: [id number] Visit Date: 04/24/2022              Requested by: Shirline Frees, MD [address],  [place] [address] PCP: Shirline Frees, MD  Chief Complaint  Patient presents with   Right Leg - Follow-up   Left Leg - Follow-up      HPI: Patient is a 76 year old woman with bilateral lower extremity venous stasis insufficiency ulcers.  Patient has been using compression socks.  Assessment & Plan: Visit Diagnoses:  1. Venous stasis of both lower extremities     Plan: Patient will continue with compression socks and follow-up as needed.  Follow-Up Instructions: Return if symptoms worsen or fail to improve.   Ortho Exam  Patient is alert, oriented, no adenopathy, well-dressed, normal affect, normal respiratory effort. Examination patient has no active venous ulcers she does have a varicose vein on the right anterior compartment without ulceration.  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  Imaging: No results found. No images are attached to the encounter.  Labs: Lab Results  Component Value Date   LABURIC 2.8 02/15/2021   LABURIC 2.6 12/17/2020   LABURIC 2.0 (L) 11/28/2020     Lab Results  Component Value Date   ALBUMIN 3.9 03/07/2022   ALBUMIN 4.1 01/24/2022   ALBUMIN 3.8 11/29/2021    No results found for: MG No results found for: VD25OH  No results found for: PREALBUMIN    Latest Ref Rng & Units 03/07/2022   10:43 AM 01/24/2022   10:33 AM 11/29/2021   11:40 AM  CBC EXTENDED  WBC 4.0 - 10.5 K/uL 5.4   5.3   7.0    RBC 3.87 - 5.11 MIL/uL 4.47   4.49   4.10    Hemoglobin 12.0 - 15.0 g/dL 12.6   12.4   11.2    HCT 36.0 - 46.0 % 38.6   39.1   36.2    Platelets 150 - 400 K/uL 193   187   281    NEUT# 1.7 - 7.7 K/uL 3.0   2.3   3.0    Lymph# 0.7 - 4.0 K/uL 1.9   2.4   3.3       There is no height or weight on file to calculate  BMI.  Orders:  No orders of the defined types were placed in this encounter.  No orders of the defined types were placed in this encounter.    Procedures: No procedures performed  Clinical Data: No additional findings.  ROS:  All other systems negative, except as noted in the HPI. Review of Systems  Objective: Vital Signs: There were no vitals taken for this visit.  Specialty Comments:  No specialty comments available.  PMFS History: There are no problems to display for this patient.  Past Medical History:  Diagnosis Date   Bilateral swelling of feet    Cancer (HCC)    Cataract    Diabetes mellitus without complication (HCC)    diet controlled   Eczema    Elevated cholesterol    GERD (gastroesophageal reflux disease)    Glaucoma    Hypertension    Mitral valve prolapse    Simple endometrial hyperplasia without atypia 07/2016   In endometrial polyp with surrounding endometrium inactive recommend follow  up ultrasound for endometrial echo 1 year   Spondylisthesis     Family History  Problem Relation Age of Onset   Heart disease Mother    Heart disease Father    Breast cancer Sister 18    Past Surgical History:  Procedure Laterality Date   CESAREAN SECTION     DILATATION & CURETTAGE/HYSTEROSCOPY WITH MYOSURE N/A 08/05/2016   Procedure: DILATATION & CURETTAGE/HYSTEROSCOPY WITH MYOSURE;  Surgeon: Anastasio Auerbach, MD;  Location: [facility];  Service: Gynecology;  Laterality: N/A;   EXCISION OF SKIN TAG Left 08/05/2016   Procedure: EXCISION OF SKIN TAG;  Surgeon: Anastasio Auerbach, MD;  Location: [facility];  Service: Gynecology;  Laterality: Left;   MOUTH SURGERY     spinal tap     Social History   Occupational History   Not on file  Tobacco Use   Smoking status: Never   Smokeless tobacco: Never  Vaping Use   Vaping Use: Never used  Substance and Sexual Activity   Alcohol use: No    Alcohol/week: 0.0 standard drinks   Drug use: No   Sexual activity: Not  Currently    Birth control/protection: Post-menopausal    Comment: 1st intercourse 76 yo-Fewer than 5 partners

## 2022-05-12 ENCOUNTER — Telehealth: Payer: Self-pay | Admitting: Hematology

## 2022-05-12 NOTE — Telephone Encounter (Signed)
Patient called and cancelled upcoming appointments. Sent message to inform provider.

## 2022-05-12 NOTE — Progress Notes (Signed)
Pt stated at last visit she would not agree with the treatment Dr Irene Limbo was prescribing/ pt refused. Pt stated she may keep next appointment and think about treatment possibility. Pt called today and canceled upcoming appointment. Pt does not wish to return. Dr Irene Limbo will no longer continue to follow pt as she has declined any recommended treatment or to return for an appointment.  Dr. Shirline Frees PCP notified of situation . Dr Kenton Kingfisher was aware of the situation and had seen pt in the office. PCP office will refer pt to another [place] if pt will agree.

## 2022-06-04 ENCOUNTER — Other Ambulatory Visit: Payer: BC Managed Care – PPO

## 2022-06-18 ENCOUNTER — Ambulatory Visit: Payer: BC Managed Care – PPO | Admitting: Hematology

## 2022-09-09 ENCOUNTER — Other Ambulatory Visit (HOSPITAL_COMMUNITY): Payer: Self-pay

## 2022-09-09 MED ORDER — SPRYCEL 100 MG PO TABS
100.0000 mg | ORAL_TABLET | Freq: Every day | ORAL | 2 refills | Status: DC
  Filled 2022-09-09: qty 30, 30d supply, fill #0
  Filled 2022-10-02: qty 30, 30d supply, fill #1

## 2022-09-10 ENCOUNTER — Other Ambulatory Visit (HOSPITAL_COMMUNITY): Payer: Self-pay

## 2022-09-11 ENCOUNTER — Other Ambulatory Visit (HOSPITAL_COMMUNITY): Payer: Self-pay

## 2022-09-12 ENCOUNTER — Other Ambulatory Visit (HOSPITAL_COMMUNITY): Payer: Self-pay

## 2022-09-13 ENCOUNTER — Other Ambulatory Visit (HOSPITAL_COMMUNITY): Payer: Self-pay

## 2022-09-26 ENCOUNTER — Other Ambulatory Visit (HOSPITAL_COMMUNITY): Payer: Self-pay

## 2022-09-29 ENCOUNTER — Other Ambulatory Visit (HOSPITAL_COMMUNITY): Payer: Self-pay

## 2022-10-02 ENCOUNTER — Other Ambulatory Visit (HOSPITAL_COMMUNITY): Payer: Self-pay

## 2022-10-09 ENCOUNTER — Other Ambulatory Visit (HOSPITAL_COMMUNITY): Payer: Self-pay

## 2022-10-10 ENCOUNTER — Other Ambulatory Visit (HOSPITAL_COMMUNITY): Payer: Self-pay

## 2022-10-17 ENCOUNTER — Other Ambulatory Visit (HOSPITAL_COMMUNITY): Payer: Self-pay

## 2022-10-17 MED ORDER — SPRYCEL 140 MG PO TABS
ORAL_TABLET | ORAL | 2 refills | Status: DC
  Filled 2022-10-17: qty 30, 30d supply, fill #0
  Filled 2022-11-10: qty 30, 30d supply, fill #1
  Filled 2022-12-18: qty 30, 30d supply, fill #2

## 2022-10-18 ENCOUNTER — Other Ambulatory Visit (HOSPITAL_COMMUNITY): Payer: Self-pay

## 2022-10-20 ENCOUNTER — Other Ambulatory Visit (HOSPITAL_COMMUNITY): Payer: Self-pay

## 2022-10-21 ENCOUNTER — Other Ambulatory Visit (HOSPITAL_COMMUNITY): Payer: Self-pay

## 2022-10-28 ENCOUNTER — Other Ambulatory Visit (HOSPITAL_COMMUNITY): Payer: Self-pay

## 2022-11-07 ENCOUNTER — Other Ambulatory Visit: Payer: Self-pay

## 2022-11-08 IMAGING — DX DG CHEST 1V PORT
1 series · 1 of 1 positions shown · non-contrast
Comparison: 09/01/2017

CLINICAL DATA: Fever

EXAM:
PORTABLE CHEST 1 VIEW

[chest ap]
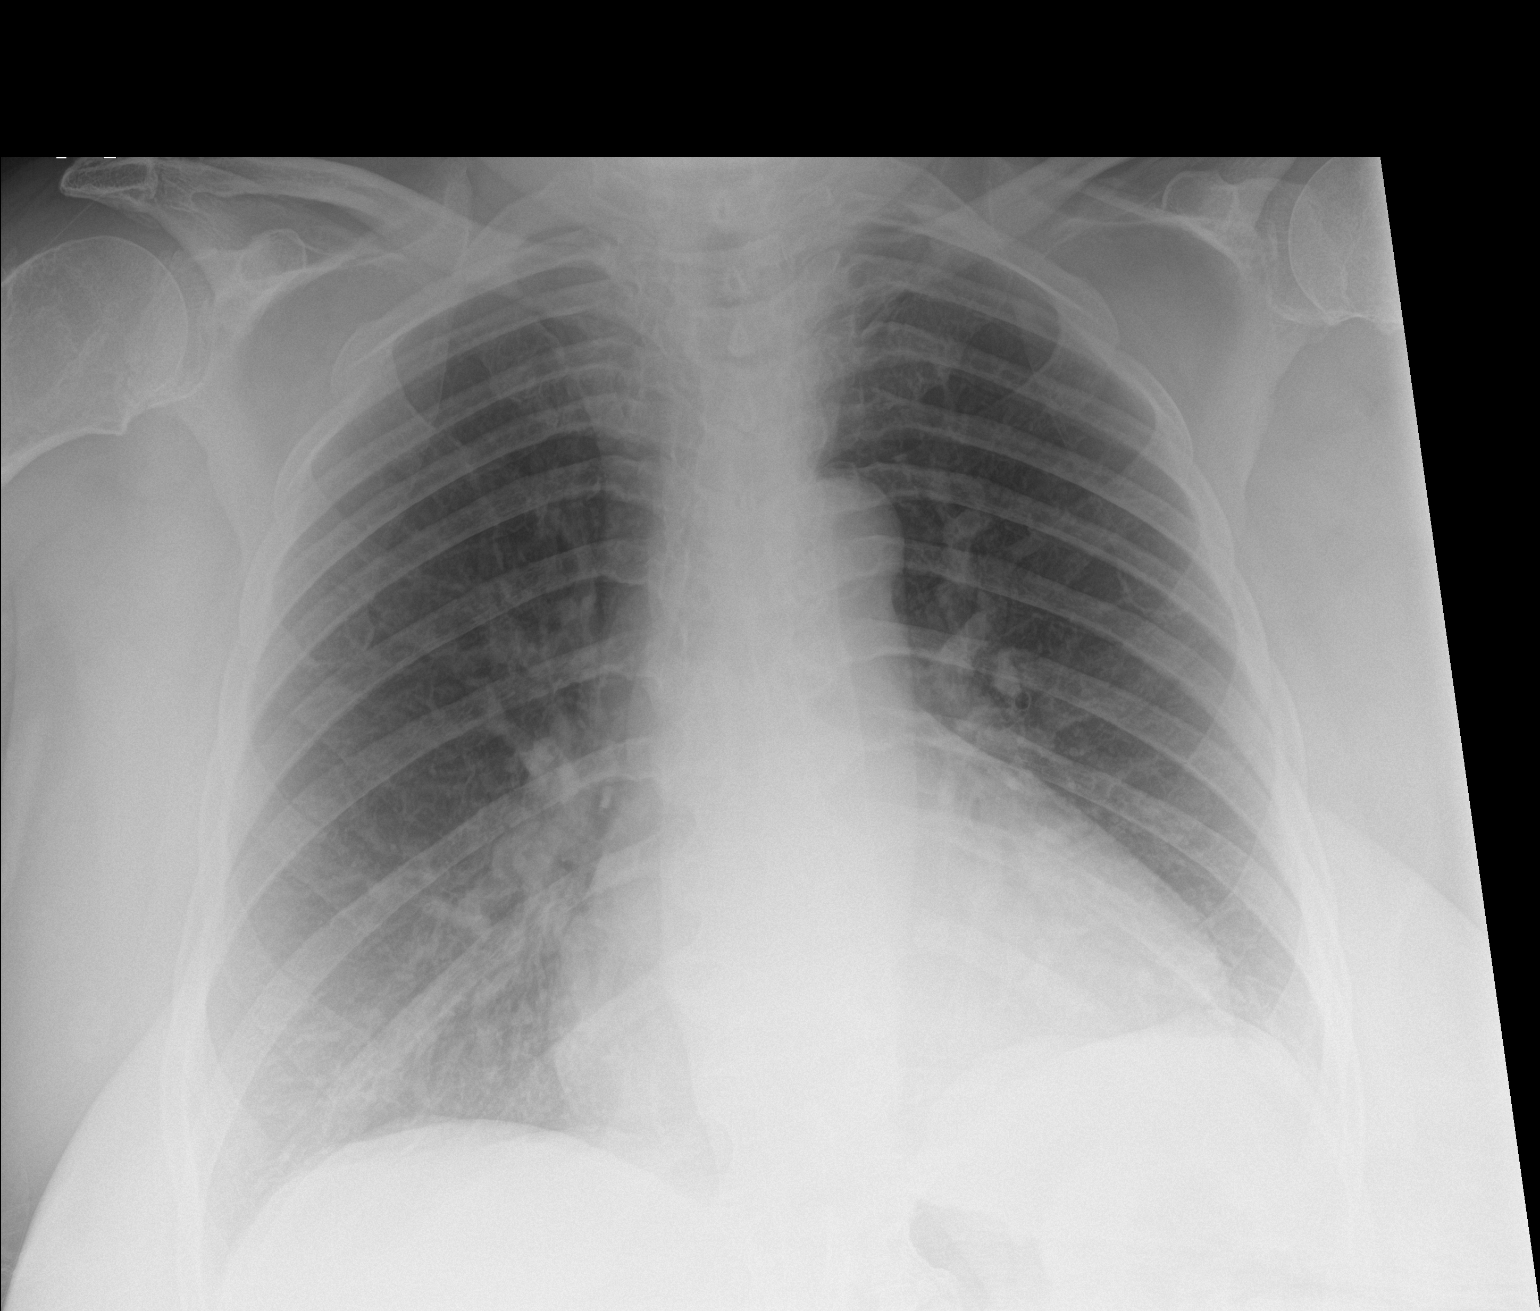

[1 of 1 positions shown; findings below may reference images not displayed]

FINDINGS: The heart size and mediastinal contours are within normal limits.
Both lungs are clear. The visualized skeletal structures are
unremarkable.
IMPRESSION: No active disease.

## 2022-11-10 ENCOUNTER — Other Ambulatory Visit: Payer: Self-pay

## 2022-11-10 ENCOUNTER — Other Ambulatory Visit (HOSPITAL_COMMUNITY): Payer: Self-pay

## 2022-11-16 IMAGING — DX DG CHEST 1V PORT
1 series · 1 of 1 positions shown · non-contrast
Comparison: Chest x-ray dated October 25, 2021

CLINICAL DATA: Chest pain

EXAM:
PORTABLE CHEST 1 VIEW

[chest ap]
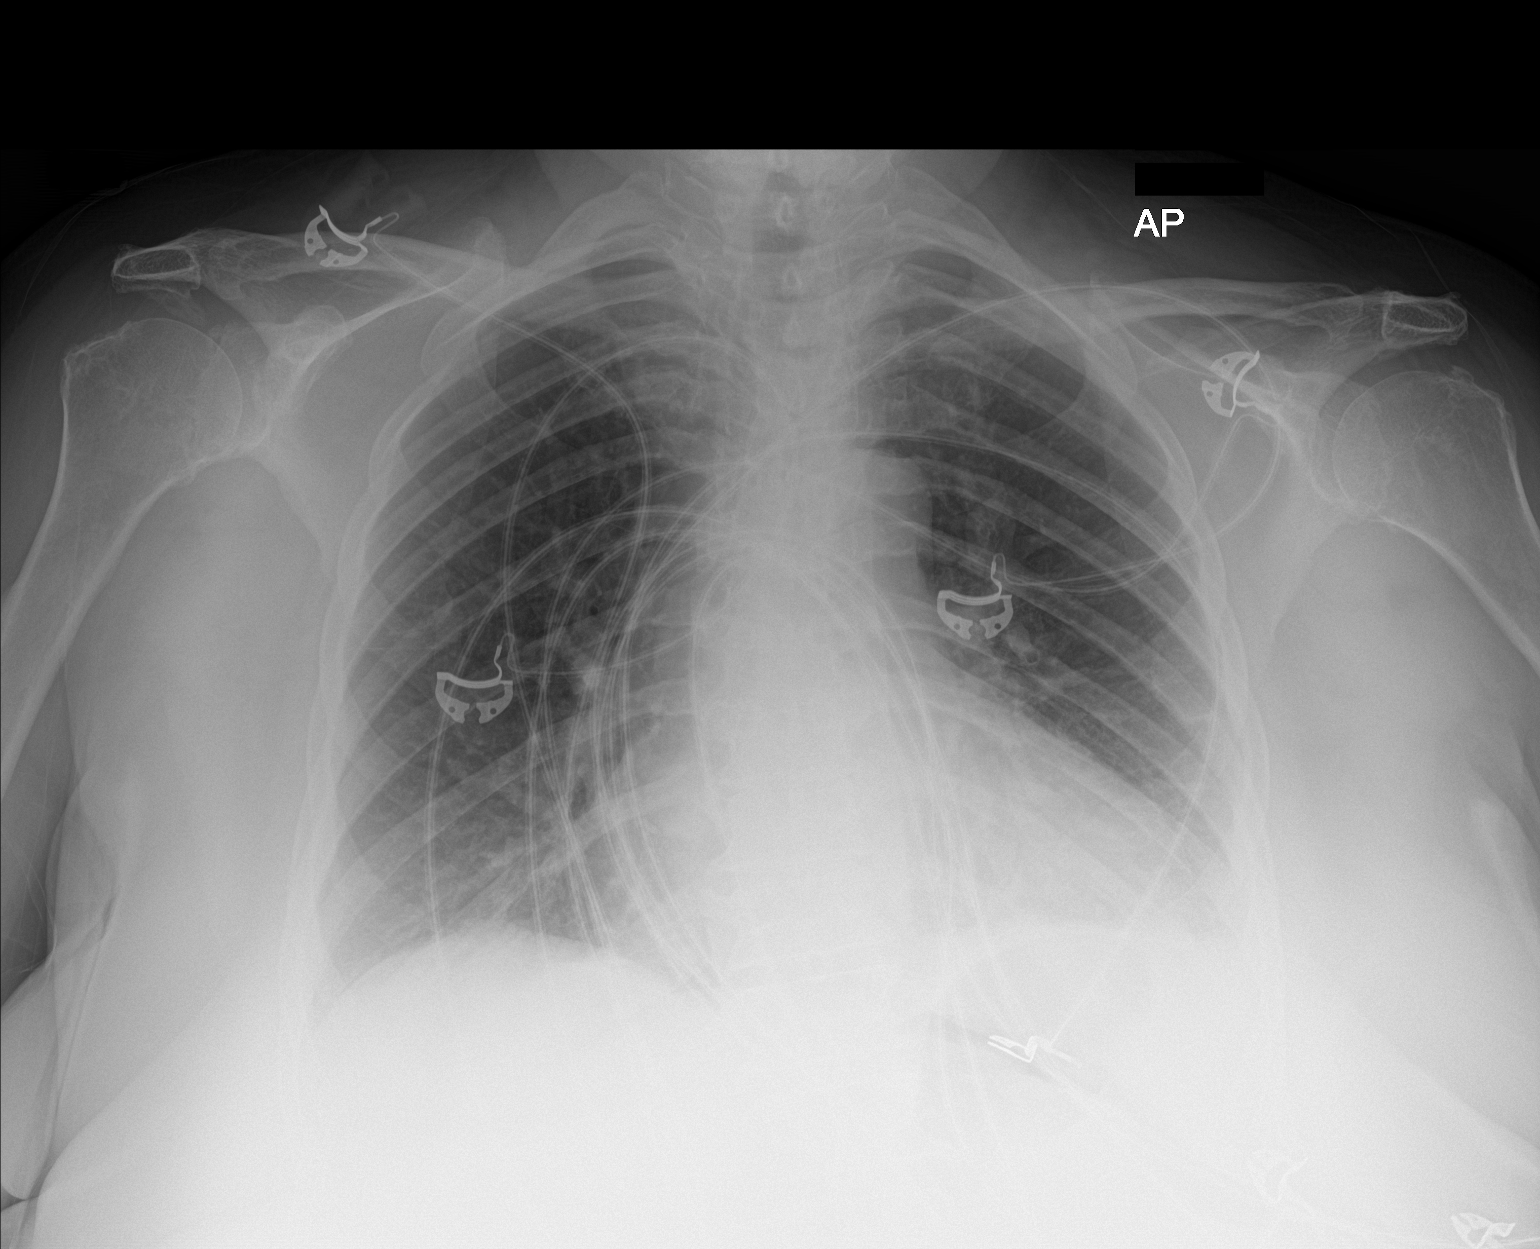

[1 of 1 positions shown; findings below may reference images not displayed]

FINDINGS: Cardiac and mediastinal contours are within normal limits for AP
technique. Possible left lower lobe consolidation. No large pleural
effusion or evidence of pneumothorax.
IMPRESSION: Mild left lower lobe opacity which could be due to atelectasis or
infection.

## 2022-11-18 ENCOUNTER — Other Ambulatory Visit (HOSPITAL_COMMUNITY): Payer: Self-pay

## 2022-11-18 ENCOUNTER — Other Ambulatory Visit: Payer: Self-pay

## 2022-11-19 ENCOUNTER — Other Ambulatory Visit (HOSPITAL_COMMUNITY): Payer: Self-pay

## 2022-12-08 ENCOUNTER — Other Ambulatory Visit (HOSPITAL_COMMUNITY): Payer: Self-pay

## 2022-12-09 ENCOUNTER — Other Ambulatory Visit (HOSPITAL_COMMUNITY): Payer: Self-pay

## 2022-12-10 ENCOUNTER — Other Ambulatory Visit (HOSPITAL_COMMUNITY): Payer: Self-pay

## 2022-12-12 ENCOUNTER — Other Ambulatory Visit (HOSPITAL_COMMUNITY): Payer: Self-pay

## 2022-12-18 ENCOUNTER — Other Ambulatory Visit (HOSPITAL_COMMUNITY): Payer: Self-pay

## 2023-01-06 ENCOUNTER — Other Ambulatory Visit (HOSPITAL_COMMUNITY): Payer: Self-pay

## 2023-01-08 ENCOUNTER — Other Ambulatory Visit: Payer: Self-pay

## 2023-01-08 ENCOUNTER — Other Ambulatory Visit (HOSPITAL_COMMUNITY): Payer: Self-pay

## 2023-01-08 MED ORDER — SPRYCEL 140 MG PO TABS
ORAL_TABLET | ORAL | 2 refills | Status: DC
  Filled 2023-01-08: qty 30, 30d supply, fill #0
  Filled 2023-02-06: qty 30, 30d supply, fill #1
  Filled 2023-03-05: qty 30, 30d supply, fill #2

## 2023-01-12 ENCOUNTER — Other Ambulatory Visit: Payer: Self-pay

## 2023-01-23 ENCOUNTER — Other Ambulatory Visit (HOSPITAL_COMMUNITY): Payer: Self-pay

## 2023-01-29 ENCOUNTER — Ambulatory Visit (INDEPENDENT_AMBULATORY_CARE_PROVIDER_SITE_OTHER): Payer: BC Managed Care – PPO

## 2023-01-29 ENCOUNTER — Ambulatory Visit (INDEPENDENT_AMBULATORY_CARE_PROVIDER_SITE_OTHER): Payer: BC Managed Care – PPO | Admitting: Orthopedic Surgery

## 2023-01-29 DIAGNOSIS — M25561 Pain in right knee: Secondary | ICD-10-CM

## 2023-02-03 ENCOUNTER — Other Ambulatory Visit (HOSPITAL_COMMUNITY): Payer: Self-pay

## 2023-02-06 ENCOUNTER — Other Ambulatory Visit (HOSPITAL_COMMUNITY): Payer: Self-pay

## 2023-02-06 ENCOUNTER — Other Ambulatory Visit: Payer: Self-pay

## 2023-02-15 ENCOUNTER — Encounter: Payer: Self-pay | Admitting: Orthopedic Surgery
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
## 2023-03-05 ENCOUNTER — Other Ambulatory Visit (HOSPITAL_COMMUNITY): Payer: Self-pay

## 2023-03-06 ENCOUNTER — Other Ambulatory Visit (HOSPITAL_COMMUNITY): Payer: Self-pay

## 2023-03-09 ENCOUNTER — Other Ambulatory Visit (HOSPITAL_COMMUNITY): Payer: Self-pay

## 2023-03-18 ENCOUNTER — Other Ambulatory Visit: Payer: Self-pay | Admitting: Physician Assistant

## 2023-03-18 ENCOUNTER — Other Ambulatory Visit: Payer: Self-pay

## 2023-03-18 ENCOUNTER — Other Ambulatory Visit (HOSPITAL_COMMUNITY): Payer: Self-pay

## 2023-03-18 DIAGNOSIS — R1032 Left lower quadrant pain: Secondary | ICD-10-CM

## 2023-03-19 ENCOUNTER — Other Ambulatory Visit (HOSPITAL_COMMUNITY): Payer: Self-pay

## 2023-03-19 ENCOUNTER — Other Ambulatory Visit: Payer: Self-pay

## 2023-03-19 ENCOUNTER — Ambulatory Visit
Admission: RE | Admit: 2023-03-19 | Discharge: 2023-03-19 | Disposition: A | Discharge status: Home / Self Care | Payer: BC Managed Care – PPO | Source: Ambulatory Visit | Attending: Physician Assistant | Admitting: Physician Assistant

## 2023-03-19 DIAGNOSIS — R1032 Left lower quadrant pain: Secondary | ICD-10-CM

## 2023-03-19 MED ORDER — IOPAMIDOL (ISOVUE-300) INJECTION 61%
80.0000 mL | Freq: Once | INTRAVENOUS | Status: AC | PRN
  Administered 2023-03-19: 80 mL via INTRAVENOUS

## 2023-03-25 NOTE — Progress Notes (Signed)
Prior Authorization request for Scemblix 40 mg Tablets approved through 03/24/2024. Patient is not yet taking medication per recent office visit notes.

## 2023-04-01 ENCOUNTER — Other Ambulatory Visit (HOSPITAL_COMMUNITY): Payer: Self-pay

## 2023-04-03 ENCOUNTER — Other Ambulatory Visit (HOSPITAL_COMMUNITY): Payer: Self-pay

## 2023-04-03 ENCOUNTER — Other Ambulatory Visit: Payer: Self-pay

## 2023-04-06 ENCOUNTER — Other Ambulatory Visit (HOSPITAL_COMMUNITY): Payer: Self-pay

## 2023-04-09 ENCOUNTER — Other Ambulatory Visit (HOSPITAL_COMMUNITY): Payer: Self-pay

## 2023-04-09 MED ORDER — SPRYCEL 140 MG PO TABS
ORAL_TABLET | ORAL | 11 refills | Status: DC
  Filled 2023-04-09: qty 30, 30d supply, fill #0
  Filled 2023-04-29: qty 30, 30d supply, fill #1
  Filled 2023-05-27: qty 30, 30d supply, fill #2
  Filled 2023-07-03: qty 30, 30d supply, fill #3
  Filled 2023-07-28: qty 30, 30d supply, fill #4
  Filled 2023-08-31: qty 30, 30d supply, fill #5
  Filled 2023-09-29: qty 30, 30d supply, fill #6
  Filled 2023-10-20: qty 30, 30d supply, fill #7
  Filled 2023-11-16: qty 30, 30d supply, fill #8

## 2023-04-14 ENCOUNTER — Other Ambulatory Visit (HOSPITAL_COMMUNITY): Payer: Self-pay

## 2023-04-29 ENCOUNTER — Other Ambulatory Visit (HOSPITAL_COMMUNITY): Payer: Self-pay

## 2023-05-05 ENCOUNTER — Other Ambulatory Visit (HOSPITAL_COMMUNITY): Payer: Self-pay

## 2023-05-08 ENCOUNTER — Other Ambulatory Visit (HOSPITAL_COMMUNITY): Payer: Self-pay

## 2023-05-27 ENCOUNTER — Other Ambulatory Visit (HOSPITAL_COMMUNITY): Payer: Self-pay

## 2023-06-02 ENCOUNTER — Other Ambulatory Visit (HOSPITAL_COMMUNITY): Payer: Self-pay

## 2023-06-30 ENCOUNTER — Other Ambulatory Visit (HOSPITAL_COMMUNITY): Payer: Self-pay

## 2023-07-03 ENCOUNTER — Other Ambulatory Visit (HOSPITAL_COMMUNITY): Payer: Self-pay

## 2023-07-28 ENCOUNTER — Other Ambulatory Visit (HOSPITAL_COMMUNITY): Payer: Self-pay

## 2023-08-04 ENCOUNTER — Other Ambulatory Visit: Payer: Self-pay

## 2023-08-04 ENCOUNTER — Other Ambulatory Visit (HOSPITAL_COMMUNITY): Payer: Self-pay

## 2023-08-15 ENCOUNTER — Encounter (HOSPITAL_COMMUNITY): Payer: Self-pay

## 2023-08-24 ENCOUNTER — Other Ambulatory Visit: Payer: Self-pay

## 2023-08-28 ENCOUNTER — Other Ambulatory Visit (HOSPITAL_COMMUNITY): Payer: Self-pay

## 2023-08-31 ENCOUNTER — Other Ambulatory Visit (HOSPITAL_COMMUNITY): Payer: Self-pay

## 2023-08-31 ENCOUNTER — Other Ambulatory Visit: Payer: Self-pay

## 2023-08-31 NOTE — Progress Notes (Signed)
Specialty Pharmacy Refill Coordination Note  Erica [name] is a 77 y.o. female contacted today regarding refills of specialty medication(s) Dasatinib   Patient requested [name] at [facility] [place] date: 09/04/23   Medication will be filled on 09/03/23.

## 2023-09-03 ENCOUNTER — Other Ambulatory Visit: Payer: Self-pay

## 2023-09-05 ENCOUNTER — Other Ambulatory Visit (HOSPITAL_COMMUNITY): Payer: Self-pay

## 2023-09-28 ENCOUNTER — Other Ambulatory Visit: Payer: Self-pay

## 2023-09-29 ENCOUNTER — Other Ambulatory Visit: Payer: Self-pay

## 2023-09-29 ENCOUNTER — Encounter (HOSPITAL_COMMUNITY): Payer: Self-pay

## 2023-09-29 NOTE — Progress Notes (Signed)
Specialty Pharmacy Ongoing Clinical Assessment Note  Erica [name] is a 77 y.o. female who is being followed by the specialty pharmacy service for RxSp Oncology   Patient's specialty medication(s) reviewed today: Dasatinib   Missed doses in the last 4 weeks: 0   Patient/Caregiver did not have any additional questions or concerns.   Therapeutic benefit summary: Patient is NOT achieving benefit (Disease continues to progress per provider note as patient has declined alternative therapy.)   Adverse events/side effects summary: No adverse events/side effects   Patient's therapy is appropriate to: Continue    Goals Addressed             This Visit's Progress    Slow Disease Progression       Patient is not on track and worsening. Patient will maintain adherence, adhere to provider and/or lab appointments, and be evaluated at upcoming provider appointment to assess progress. Disease continues to progress per provider note as patient has declined alternative therapy.          Follow up:  6 months  Otto Herb Specialty Pharmacist

## 2023-09-29 NOTE — Progress Notes (Signed)
Specialty Pharmacy Refill Coordination Note  Erica [name] is a 78 y.o. female contacted today regarding refills of specialty medication(s) Dasatinib   Patient requested [name] at [facility] [place] date: 10/02/23   Medication will be filled on 10/01/23.

## 2023-10-01 ENCOUNTER — Other Ambulatory Visit (HOSPITAL_COMMUNITY): Payer: Self-pay

## 2023-10-02 ENCOUNTER — Other Ambulatory Visit: Payer: Self-pay

## 2023-10-02 NOTE — Progress Notes (Signed)
Pharmacy Patient Advocate Encounter   Received notification from Patient Pharmacy that prior authorization for Sprycel is required/requested.   Insurance verification completed.   The patient is insured through [facility] .   Per test claim: PA required; PA submitted to above mentioned insurance via CoverMyMeds Key/confirmation #/EOC [id number] Status is pending

## 2023-10-05 ENCOUNTER — Other Ambulatory Visit: Payer: Self-pay

## 2023-10-05 ENCOUNTER — Other Ambulatory Visit: Payer: Self-pay | Admitting: Family Medicine

## 2023-10-05 ENCOUNTER — Ambulatory Visit
Admission: RE | Admit: 2023-10-05 | Discharge: 2023-10-05 | Disposition: A | Discharge status: Home / Self Care | Payer: BC Managed Care – PPO | Source: Ambulatory Visit | Attending: Family Medicine | Admitting: Family Medicine

## 2023-10-05 ENCOUNTER — Other Ambulatory Visit (HOSPITAL_COMMUNITY): Payer: Self-pay

## 2023-10-05 DIAGNOSIS — R0602 Shortness of breath: Secondary | ICD-10-CM

## 2023-10-05 NOTE — Progress Notes (Addendum)
Pharmacy Patient Advocate Encounter  Received notification from [facility] that Prior Authorization for Sprycel has been APPROVED from 10/05/23 to 10/03/24   PA #/Case ID/Reference #:  [id number]

## 2023-10-05 NOTE — Progress Notes (Signed)
Medication required PA and was approved 11/11. PPU 11/11

## 2023-10-12 ENCOUNTER — Telehealth: Payer: Self-pay | Admitting: Internal Medicine

## 2023-10-12 NOTE — Telephone Encounter (Signed)
error 

## 2023-10-13 ENCOUNTER — Encounter: Payer: Self-pay | Admitting: Pulmonary Disease

## 2023-10-13 ENCOUNTER — Ambulatory Visit (INDEPENDENT_AMBULATORY_CARE_PROVIDER_SITE_OTHER): Payer: BC Managed Care – PPO | Admitting: Pulmonary Disease

## 2023-10-13 VITALS — BP 128/72 | HR 79 | Ht 59.0 in | Wt 193.0 lb

## 2023-10-13 DIAGNOSIS — R0600 Dyspnea, unspecified: Secondary | ICD-10-CM | POA: Diagnosis not present

## 2023-10-13 DIAGNOSIS — J90 Pleural effusion, not elsewhere classified: Secondary | ICD-10-CM

## 2023-10-13 NOTE — Patient Instructions (Signed)
Nice to meet you  We do a thoracentesis or procedure to remove fluid from around the right lung  I will send a message to our team and they will be in contact to schedule a date that works with you in the coming weeks  Return to clinic as needed based on results of the fluid

## 2023-10-16 NOTE — H&P (View-Only) (Signed)
 @Patient  ID: Erica [name], female    DOB: 02-Jun-1946, 77 y.o.   MRN: [id number]  Chief Complaint  Patient presents with   Consult    Consult visit. Fluid around R Lung per PCP. Pt c/o SOB on exertion.    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  Multiple prior oncology notes reviewed.  Patient with CML.  Been on Dasatinib for a while.  Per most recent oncology note recommended changing therapy based on the genetic mutations of her CML.  She is refused she tells me given risk of pancreas damage.  It is noted that her leukocytosis and thrombocytosis is worsening over time review of serial images as well as the fraction of BCR-ABL PCR.  Regardless she complains of fatigue.  And dyspnea on exertion.  She separates out the tube.  Both present for a few months.  Cannot walk very far without getting short of breath.  As part of this workup PCP obtain chest x-ray on my review interpretation this reveals new right effusion 10/05/2023 with possible left hemidiaphragm elevation.  Prior chest x-ray 2022 on my review interpretation is clear bilaterally.  Long discussion regarding pleural effusion.  Potential causes of this.  Diagnostic modalities for this.  She request over and over again a pill to make it better.  I state based on my suspicion of the nature related to Dasatinib as well as the unilateral nature of the effusion that the pill will not help.  No diuretic.  No antibiotic.  She has on multiple occasions.  Interestingly, she was refused pills to treat her other medical conditions.  After a lot of discussion about risk and benefits as a pertains to thoracentesis, she finally agrees to pursue this in the future as long as I am the one doing the procedure.  Questionaires / Pulmonary Flowsheets:   ACT:      No data to display          MMRC:     No data to display           Epworth:      No data to display          Tests:   FENO:  No results found for: "NITRICOXIDE"  PFT:     No data to display          WALK:      No data to display          Imaging: Personally reviewed and as per EMR and discussion in this note DG Chest 2 View  Result Date: 10/05/2023 CLINICAL DATA:  Shortness of breath for 2 months.  Worsening. EXAM: CHEST - 2 VIEW COMPARISON:  Chest radiographs 01/06/2023, 11/02/2021, 09/11/2017 FINDINGS: Cardiac silhouette is again mildly enlarged. Mediastinal contours are within normal limits. There is a mild-to-moderate right pleural effusion. Associated right basilar likely subsegmental atelectasis. Mild left lower lung opacification may represent the normal epicardial fat pad and is similar to prior radiographs. No pneumothorax. Mild-to-moderate multilevel degenerative disc changes of the thoracic spine. IMPRESSION: 1. Mild-to-moderate right pleural effusion with associated right basilar likely subsegmental atelectasis, new from 01/06/2023. 2. Stable mildly enlarged cardiac silhouette. Electronically Signed   By: Neita Garnet M.D.   On: 10/05/2023 14:32    Lab Results: Personally reviewed CBC    Component Value Date/Time   WBC 5.4 03/07/2022 1043  WBC 5.3 01/24/2022 1033   RBC 4.47 03/07/2022 1043   HGB 12.6 03/07/2022 1043   HCT 38.6 03/07/2022 1043   PLT 193 03/07/2022 1043   MCV 86.4 03/07/2022 1043   MCH 28.2 03/07/2022 1043   MCHC 32.6 03/07/2022 1043   RDW 14.7 03/07/2022 1043   LYMPHSABS 1.9 03/07/2022 1043   MONOABS 0.3 03/07/2022 1043   EOSABS 0.1 03/07/2022 1043   BASOSABS 0.1 03/07/2022 1043    BMET    Component Value Date/Time   NA 138 03/07/2022 1043   K 3.9 03/07/2022 1043   CL 105 03/07/2022 1043   CO2 27 03/07/2022 1043   GLUCOSE 226 (H) 03/07/2022 1043   BUN 12 03/07/2022 1043   CREATININE 0.92 03/07/2022 1043   CALCIUM 9.1 03/07/2022 1043   GFRNONAA >60 03/07/2022 1043    GFRAA >60 07/31/2016 1045    BNP No results found for: "BNP"  ProBNP No results found for: "PROBNP"  Specialty Problems   None   Allergies  Allergen Reactions   Penicillins Hives    Has patient had a PCN reaction causing immediate rash, facial/tongue/throat swelling, SOB or lightheadedness with hypotension: Yes Has patient had a PCN reaction causing severe rash involving mucus membranes or skin necrosis: Yes Has patient had a PCN reaction that required hospitalization No Has patient had a PCN reaction occurring within the last 10 years: No If all of the above answers are "NO", then may proceed with Cephalosporin use.    Sulfa Antibiotics Hives    Immunization History  Administered Date(s) Administered   PFIZER(Purple Top)SARS-COV-2 Vaccination 04/14/2020, 05/10/2020    Past Medical History:  Diagnosis Date   Bilateral swelling of feet    Cancer (HCC)    Cataract    Diabetes mellitus without complication (HCC)    diet controlled   Eczema    Elevated cholesterol    GERD (gastroesophageal reflux disease)    Glaucoma    Hypertension    Mitral valve prolapse    Simple endometrial hyperplasia without atypia 07/2016   In endometrial polyp with surrounding endometrium inactive recommend follow up ultrasound for endometrial echo 1 year   Spondylisthesis     Tobacco History: Social History   Tobacco Use  Smoking Status Never  Smokeless Tobacco Never   Counseling given: Not Answered   Continue to not smoke  Outpatient Encounter Medications as of 10/13/2023  Medication Sig   asciminib hcl 40 MG TABS Take 40 mg by mouth 2 (two) times daily. Take on an empty stomach at least 2 hours before or 1 hour after food.   dasatinib (SPRYCEL) 140 MG tablet Take 1 tablet (140 mg total) by mouth daily. Take at the same time each day, with or without food.  Swallow whole; do not break, cut, or crush.   furosemide (LASIX) 20 MG tablet Take 20 mg by mouth daily.   gabapentin  (NEURONTIN) 100 MG capsule Take 100 mg by mouth 3 (three) times daily.   latanoprost (XALATAN) 0.005 % ophthalmic solution Place 1 drop into both eyes at bedtime.    loratadine (CLARITIN) 10 MG tablet Take 10 mg by mouth daily as needed for allergies.   propranolol (INDERAL) 10 MG tablet Take 10 mg by mouth 3 (three) times daily.   traMADol (ULTRAM) 50 MG tablet Take 50 mg by mouth every 6 (six) hours as needed for moderate pain.    triamcinolone cream (KENALOG) 0.1 % Apply 1 application topically 2 (two) times daily. Reported on 06/06/2016   [  DISCONTINUED] aspirin EC 81 MG tablet Take 1 tablet (81 mg total) by mouth daily. Swallow whole.   [DISCONTINUED] dasatinib (SPRYCEL) 140 MG tablet Take 1 tablet (140 mg total) by mouth daily.   [DISCONTINUED] metFORMIN (GLUCOPHAGE) 500 MG tablet Take by mouth 2 (two) times daily with a meal.   [DISCONTINUED] triamterene-hydrochlorothiazide (DYAZIDE) 37.5-25 MG capsule Take 1 capsule by mouth daily.   No facility-administered encounter medications on file as of 10/13/2023.     Review of Systems  Review of Systems  No chest pain with exertion.  No orthopnea or PND.  Comprehensive review of systems otherwise negative. Physical Exam  BP 128/72 (BP Location: Right Arm, Cuff Size: Large)   Pulse 79   Ht 4\' 11"  (1.499 m)   Wt 193 lb (87.5 kg)   SpO2 97%   BMI 38.98 kg/m   Wt Readings from Last 5 Encounters:  10/13/23 193 lb (87.5 kg)  04/18/22 191 lb 12.8 oz (87 kg)  03/21/22 189 lb 9.6 oz (86 kg)  02/07/22 187 lb 3.2 oz (84.9 kg)  12/13/21 186 lb 9.6 oz (84.6 kg)    BMI Readings from Last 5 Encounters:  10/13/23 38.98 kg/m  04/18/22 38.74 kg/m  03/21/22 38.29 kg/m  02/07/22 37.81 kg/m  12/13/21 37.69 kg/m     Physical Exam General: Sitting in chair, no acute distress Eyes: EOMI, no icterus Neck: Supple, JVP Pulmonary: Diminished to absent in the right base, otherwise clear Cardiovascular: Warm, no edema Abdomen: Nondistended,  bowel sounds present MSK: No synovitis, no joint effusion Neuro: Normal gait, no weakness Psych: Labile affect, apprehensive mood  Assessment & Plan:   Unilateral right pleural effusion: Insidious onset of dyspnea the last several weeks.  Prior chest imaging 77 years old.  Suspect is accumulated over the last several months to years in the setting of Dasatinib therapy.  Recommend therapeutic and diagnostic thoracentesis to either confirm the diagnosis or arrived with a different diagnosis.  As well to provide therapeutic benefit, improvement in her dyspnea.  I do not suspect it is causing her fatigue.  Her fatigue were likely related to poorly controlled CML she has refused appropriate cancer therapy per oncology notes.  After much discussion, a lot of time talking about the risks and benefits, she agrees to thoracentesis if done by me.  Will coordinate daytime available to pursue this at [facility].   Return if symptoms worsen or fail to improve.   Karren Burly, MD 10/16/2023   This appointment required 65 minutes of patient care (this includes precharting, chart review, review of results, face-to-face care, etc.).

## 2023-10-16 NOTE — Progress Notes (Signed)
@Patient  ID: Erica [name], female    DOB: 02-Jun-1946, 77 y.o.   MRN: [id number]  Chief Complaint  Patient presents with   Consult    Consult visit. Fluid around R Lung per PCP. Pt c/o SOB on exertion.    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  Multiple prior oncology notes reviewed.  Patient with CML.  Been on Dasatinib for a while.  Per most recent oncology note recommended changing therapy based on the genetic mutations of her CML.  She is refused she tells me given risk of pancreas damage.  It is noted that her leukocytosis and thrombocytosis is worsening over time review of serial images as well as the fraction of BCR-ABL PCR.  Regardless she complains of fatigue.  And dyspnea on exertion.  She separates out the tube.  Both present for a few months.  Cannot walk very far without getting short of breath.  As part of this workup PCP obtain chest x-ray on my review interpretation this reveals new right effusion 10/05/2023 with possible left hemidiaphragm elevation.  Prior chest x-ray 2022 on my review interpretation is clear bilaterally.  Long discussion regarding pleural effusion.  Potential causes of this.  Diagnostic modalities for this.  She request over and over again a pill to make it better.  I state based on my suspicion of the nature related to Dasatinib as well as the unilateral nature of the effusion that the pill will not help.  No diuretic.  No antibiotic.  She has on multiple occasions.  Interestingly, she was refused pills to treat her other medical conditions.  After a lot of discussion about risk and benefits as a pertains to thoracentesis, she finally agrees to pursue this in the future as long as I am the one doing the procedure.  Questionaires / Pulmonary Flowsheets:   ACT:      No data to display          MMRC:     No data to display           Epworth:      No data to display          Tests:   FENO:  No results found for: "NITRICOXIDE"  PFT:     No data to display          WALK:      No data to display          Imaging: Personally reviewed and as per EMR and discussion in this note DG Chest 2 View  Result Date: 10/05/2023 CLINICAL DATA:  Shortness of breath for 2 months.  Worsening. EXAM: CHEST - 2 VIEW COMPARISON:  Chest radiographs 01/06/2023, 11/02/2021, 09/11/2017 FINDINGS: Cardiac silhouette is again mildly enlarged. Mediastinal contours are within normal limits. There is a mild-to-moderate right pleural effusion. Associated right basilar likely subsegmental atelectasis. Mild left lower lung opacification may represent the normal epicardial fat pad and is similar to prior radiographs. No pneumothorax. Mild-to-moderate multilevel degenerative disc changes of the thoracic spine. IMPRESSION: 1. Mild-to-moderate right pleural effusion with associated right basilar likely subsegmental atelectasis, new from 01/06/2023. 2. Stable mildly enlarged cardiac silhouette. Electronically Signed   By: Neita Garnet M.D.   On: 10/05/2023 14:32    Lab Results: Personally reviewed CBC    Component Value Date/Time   WBC 5.4 03/07/2022 1043  WBC 5.3 01/24/2022 1033   RBC 4.47 03/07/2022 1043   HGB 12.6 03/07/2022 1043   HCT 38.6 03/07/2022 1043   PLT 193 03/07/2022 1043   MCV 86.4 03/07/2022 1043   MCH 28.2 03/07/2022 1043   MCHC 32.6 03/07/2022 1043   RDW 14.7 03/07/2022 1043   LYMPHSABS 1.9 03/07/2022 1043   MONOABS 0.3 03/07/2022 1043   EOSABS 0.1 03/07/2022 1043   BASOSABS 0.1 03/07/2022 1043    BMET    Component Value Date/Time   NA 138 03/07/2022 1043   K 3.9 03/07/2022 1043   CL 105 03/07/2022 1043   CO2 27 03/07/2022 1043   GLUCOSE 226 (H) 03/07/2022 1043   BUN 12 03/07/2022 1043   CREATININE 0.92 03/07/2022 1043   CALCIUM 9.1 03/07/2022 1043   GFRNONAA >60 03/07/2022 1043    GFRAA >60 07/31/2016 1045    BNP No results found for: "BNP"  ProBNP No results found for: "PROBNP"  Specialty Problems   None   Allergies  Allergen Reactions   Penicillins Hives    Has patient had a PCN reaction causing immediate rash, facial/tongue/throat swelling, SOB or lightheadedness with hypotension: Yes Has patient had a PCN reaction causing severe rash involving mucus membranes or skin necrosis: Yes Has patient had a PCN reaction that required hospitalization No Has patient had a PCN reaction occurring within the last 10 years: No If all of the above answers are "NO", then may proceed with Cephalosporin use.    Sulfa Antibiotics Hives    Immunization History  Administered Date(s) Administered   PFIZER(Purple Top)SARS-COV-2 Vaccination 04/14/2020, 05/10/2020    Past Medical History:  Diagnosis Date   Bilateral swelling of feet    Cancer (HCC)    Cataract    Diabetes mellitus without complication (HCC)    diet controlled   Eczema    Elevated cholesterol    GERD (gastroesophageal reflux disease)    Glaucoma    Hypertension    Mitral valve prolapse    Simple endometrial hyperplasia without atypia 07/2016   In endometrial polyp with surrounding endometrium inactive recommend follow up ultrasound for endometrial echo 1 year   Spondylisthesis     Tobacco History: Social History   Tobacco Use  Smoking Status Never  Smokeless Tobacco Never   Counseling given: Not Answered   Continue to not smoke  Outpatient Encounter Medications as of 77/19/2024  Medication Sig   asciminib hcl 40 MG TABS Take 40 mg by mouth 2 (two) times daily. Take on an empty stomach at least 2 hours before or 1 hour after food.   dasatinib (SPRYCEL) 140 MG tablet Take 1 tablet (140 mg total) by mouth daily. Take at the same time each day, with or without food.  Swallow whole; do not break, cut, or crush.   furosemide (LASIX) 20 MG tablet Take 20 mg by mouth daily.   gabapentin  (NEURONTIN) 100 MG capsule Take 100 mg by mouth 3 (three) times daily.   latanoprost (XALATAN) 0.005 % ophthalmic solution Place 1 drop into both eyes at bedtime.    loratadine (CLARITIN) 10 MG tablet Take 10 mg by mouth daily as needed for allergies.   propranolol (INDERAL) 10 MG tablet Take 10 mg by mouth 3 (three) times daily.   traMADol (ULTRAM) 50 MG tablet Take 50 mg by mouth every 6 (six) hours as needed for moderate pain.    triamcinolone cream (KENALOG) 0.1 % Apply 1 application topically 2 (two) times daily. Reported on 06/06/2016   [  DISCONTINUED] aspirin EC 81 MG tablet Take 1 tablet (81 mg total) by mouth daily. Swallow whole.   [DISCONTINUED] dasatinib (SPRYCEL) 140 MG tablet Take 1 tablet (140 mg total) by mouth daily.   [DISCONTINUED] metFORMIN (GLUCOPHAGE) 500 MG tablet Take by mouth 2 (two) times daily with a meal.   [DISCONTINUED] triamterene-hydrochlorothiazide (DYAZIDE) 37.5-25 MG capsule Take 1 capsule by mouth daily.   No facility-administered encounter medications on file as of 77/19/2024.     Review of Systems  Review of Systems  No chest pain with exertion.  No orthopnea or PND.  Comprehensive review of systems otherwise negative. Physical Exam  BP 128/72 (BP Location: Right Arm, Cuff Size: Large)   Pulse 79   Ht 4\' 11"  (1.499 m)   Wt 193 lb (87.5 kg)   SpO2 97%   BMI 38.98 kg/m   Wt Readings from Last 5 Encounters:  10/13/23 193 lb (87.5 kg)  04/18/22 191 lb 12.8 oz (87 kg)  03/21/22 189 lb 9.6 oz (86 kg)  02/07/22 187 lb 3.2 oz (84.9 kg)  12/13/21 186 lb 9.6 oz (84.6 kg)    BMI Readings from Last 5 Encounters:  10/13/23 38.98 kg/m  04/18/22 38.74 kg/m  03/21/22 38.29 kg/m  02/07/22 37.81 kg/m  12/13/21 37.69 kg/m     Physical Exam General: Sitting in chair, no acute distress Eyes: EOMI, no icterus Neck: Supple, JVP Pulmonary: Diminished to absent in the right base, otherwise clear Cardiovascular: Warm, no edema Abdomen: Nondistended,  bowel sounds present MSK: No synovitis, no joint effusion Neuro: Normal gait, no weakness Psych: Labile affect, apprehensive mood  Assessment & Plan:   Unilateral right pleural effusion: Insidious onset of dyspnea the last several weeks.  Prior chest imaging 77 years old.  Suspect is accumulated over the last several months to years in the setting of Dasatinib therapy.  Recommend therapeutic and diagnostic thoracentesis to either confirm the diagnosis or arrived with a different diagnosis.  As well to provide therapeutic benefit, improvement in her dyspnea.  I do not suspect it is causing her fatigue.  Her fatigue were likely related to poorly controlled CML she has refused appropriate cancer therapy per oncology notes.  After much discussion, a lot of time talking about the risks and benefits, she agrees to thoracentesis if done by me.  Will coordinate daytime available to pursue this at [facility].   Return if symptoms worsen or fail to improve.   Karren Burly, MD 10/16/2023   This appointment required 65 minutes of patient care (this includes precharting, chart review, review of results, face-to-face care, etc.).

## 2023-10-20 ENCOUNTER — Other Ambulatory Visit (HOSPITAL_COMMUNITY): Payer: Self-pay

## 2023-10-20 ENCOUNTER — Other Ambulatory Visit (HOSPITAL_COMMUNITY): Payer: Self-pay | Admitting: Pharmacy Technician

## 2023-10-20 NOTE — Progress Notes (Signed)
Specialty Pharmacy Refill Coordination Note  Erica [name] is a 77 y.o. female contacted today regarding refills of specialty medication(s) Dasatinib   Patient requested [name] at [facility] at [place] date: 10/30/23   Medication will be filled on 10/29/23.

## 2023-10-21 ENCOUNTER — Telehealth: Payer: Self-pay | Admitting: Pulmonary Disease

## 2023-10-21 NOTE — Telephone Encounter (Signed)
Printed to schedule.

## 2023-10-21 NOTE — Telephone Encounter (Signed)
Please arrange thoracentesis at [facility] endoscopy with me an afternoon 12/9 or 12/12. Diagnosis is pleural effusion. Risks and benefits discussed at recent OV. Thanks!

## 2023-10-26 ENCOUNTER — Telehealth: Payer: Self-pay | Admitting: Pulmonary Disease

## 2023-10-26 NOTE — Telephone Encounter (Signed)
Pt called in to answering service to inform us she needs appt to get her fluid drained on her lungs, earliest avail is Dec 16th At 9:45, appt accepted. Please call if something come open sooner  [phone]

## 2023-10-28 NOTE — Telephone Encounter (Signed)
Procedure is scheduled for 11/02/23 at 2:00 arrive at 1:30 patient is aware but she was really having a hard time talking not sure if someone needs to call her

## 2023-10-28 NOTE — Telephone Encounter (Signed)
Patient aware of appt for procedure 11/02/23 at 2:00 arrive at 1:30 at [facility]

## 2023-10-28 NOTE — Telephone Encounter (Signed)
I called the pt and there was no answer- LMTCB. ?

## 2023-10-29 ENCOUNTER — Other Ambulatory Visit: Payer: Self-pay

## 2023-11-02 ENCOUNTER — Ambulatory Visit (HOSPITAL_COMMUNITY)
Admission: RE | Admit: 2023-11-02 | Discharge: 2023-11-02 | Disposition: A | Discharge status: Home / Self Care | Payer: BC Managed Care – PPO | Attending: Pulmonary Disease | Admitting: Pulmonary Disease

## 2023-11-02 ENCOUNTER — Encounter (HOSPITAL_COMMUNITY)
Admission: RE | Disposition: A | Discharge status: Home / Self Care | Payer: Self-pay | Source: Home / Self Care | Attending: Pulmonary Disease

## 2023-11-02 ENCOUNTER — Ambulatory Visit (HOSPITAL_COMMUNITY): Payer: BC Managed Care – PPO

## 2023-11-02 DIAGNOSIS — J90 Pleural effusion, not elsewhere classified: Secondary | ICD-10-CM | POA: Diagnosis not present

## 2023-11-02 HISTORY — PX: THORACENTESIS: SHX235

## 2023-11-02 LAB — BODY FLUID CELL COUNT WITH DIFFERENTIAL
Eos, Fluid: 3 %
Lymphs, Fluid: 80 %
Monocyte-Macrophage-Serous Fluid: 1 % — ABNORMAL LOW (ref 50–90)
Neutrophil Count, Fluid: 2 % (ref 0–25)
Total Nucleated Cell Count, Fluid: 1880 uL — ABNORMAL HIGH (ref 0–1000)

## 2023-11-02 LAB — ALBUMIN, PLEURAL OR PERITONEAL FLUID: Albumin, Fluid: 2.6 g/dL

## 2023-11-02 LAB — PROTEIN, PLEURAL OR PERITONEAL FLUID: Total protein, fluid: 3.8 g/dL

## 2023-11-02 LAB — LACTATE DEHYDROGENASE, PLEURAL OR PERITONEAL FLUID: LD, Fluid: 248 U/L — ABNORMAL HIGH (ref 3–23)

## 2023-11-02 SURGERY — THORACENTESIS

## 2023-11-02 NOTE — Progress Notes (Addendum)
Thorcentesis completed by Dr. Judeth Horn. 1300 red pleural fluid out. Labs for pleural fluid sent down to lab per order. Chest xray taken. Hunsucker reviewed xray and patient sent home.

## 2023-11-02 NOTE — Op Note (Signed)
Thoracentesis  Procedure Note  Erica [name]  [id number]  1946-05-22  Date:11/02/23  Time:2:41 PM   Provider Performing:Darrelyn Morro R Wandalee Klang   Procedure: Thoracentesis with imaging guidance (25956)  Indication(s) Pleural Effusion  Consent Risks of the procedure as well as the alternatives and risks of each were explained to the patient and/or caregiver.  Consent for the procedure was obtained and is signed in the bedside chart  Anesthesia Topical only with 1% lidocaine    Time Out Verified patient identification, verified procedure, site/side was marked, verified correct patient position, special equipment/implants available, medications/allergies/relevant history reviewed, required imaging and test results available.   Sterile Technique Maximal sterile technique including full sterile barrier drape, hand hygiene, sterile gown, sterile gloves, mask, hair covering, sterile ultrasound probe cover (if used).  Procedure Description Ultrasound was used to identify appropriate pleural anatomy for placement and overlying skin marked.  Area of drainage cleaned and draped in sterile fashion. Lidocaine was used to anesthetize the skin and subcutaneous tissue.  1300 cc's of serosanguinous appearing fluid was drained from the right pleural space. Catheter then removed and bandaid applied to site.   Complications/Tolerance None; patient tolerated the procedure well. Chest X-ray is ordered to confirm no post-procedural complication.   EBL Minimal   Specimen(s) Pleural fluid

## 2023-11-02 NOTE — Interval H&P Note (Signed)
History and Physical Interval Note:  11/02/2023 1:43 PM  Erica [name]  has presented today for surgery, with the diagnosis of pleural effusion.  The various methods of treatment have been discussed with the patient and family. After consideration of risks, benefits and other options for treatment, the patient has consented to  Procedure(s): THORACENTESIS (N/A) as a surgical intervention.  The patient's history has been reviewed, patient examined, no change in status, stable for surgery.  I have reviewed the patient's chart and labs.  Questions were answered to the patient's satisfaction.     Lesia Sago Sreekar Broyhill

## 2023-11-03 ENCOUNTER — Encounter (HOSPITAL_COMMUNITY): Payer: Self-pay | Admitting: Pulmonary Disease

## 2023-11-03 ENCOUNTER — Other Ambulatory Visit (HOSPITAL_COMMUNITY): Payer: Self-pay

## 2023-11-04 LAB — CYTOLOGY - NON PAP

## 2023-11-09 ENCOUNTER — Ambulatory Visit: Payer: BC Managed Care – PPO | Admitting: Pulmonary Disease

## 2023-11-09 ENCOUNTER — Telehealth: Payer: Self-pay | Admitting: Pulmonary Disease

## 2023-11-09 ENCOUNTER — Encounter: Payer: Self-pay | Admitting: Pulmonary Disease

## 2023-11-09 VITALS — BP 126/74 | HR 85 | Ht 59.0 in | Wt 192.8 lb

## 2023-11-09 DIAGNOSIS — J90 Pleural effusion, not elsewhere classified: Secondary | ICD-10-CM | POA: Diagnosis not present

## 2023-11-09 DIAGNOSIS — R0609 Other forms of dyspnea: Secondary | ICD-10-CM | POA: Diagnosis not present

## 2023-11-09 NOTE — Telephone Encounter (Signed)
PT was just seen today. Can she get a handicap placard? She is very SOB and meant to ask Dr. Judeth Horn. Please call to advise. [phone]

## 2023-11-09 NOTE — Patient Instructions (Signed)
Nice to see you  Follow-up in a few months to make sure you are doing well  Paperwork today for FMLA was filled out  Also note for work  Return to clinic in 3 to 4 months or sooner as needed with Dr. Judeth Horn

## 2023-11-09 NOTE — Progress Notes (Signed)
@Patient  ID: Erica [name], female    DOB: 05/06/1946, 77 y.o.   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  Multiple prior oncology notes reviewed.  Returns for follow-up after thoracentesis.  Reviewed results.  Lymphocytic predominance.  Mild improvement.  Still quite short of breath.  But has helped day-to-day around the house.  Longer walks not much more helpful.  Limited predominant likely related to Dasatinib.  Discussed with patient at length.  She brought FMLA paperwork for me to fill out.  Unannounced.  HPI at initial visit: Patient with CML.  Been on Dasatinib for a while.  Per most recent oncology note recommended changing therapy based on the genetic mutations of her CML.  She is refused she tells me given risk of pancreas damage.  It is noted that her leukocytosis and thrombocytosis is worsening over time review of serial images as well as the fraction of BCR-ABL PCR.  Regardless she complains of fatigue.  And dyspnea on exertion.  She separates out the tube.  Both present for a few months.  Cannot walk very far without getting short of breath.  As part of this workup PCP obtain chest x-ray on my review interpretation this reveals new right effusion 10/05/2023 with possible left hemidiaphragm elevation.  Prior chest x-ray 2022 on my review interpretation is clear bilaterally.  Long discussion regarding pleural effusion.  Potential causes of this.  Diagnostic modalities for this.  She request over and over again a pill to make it better.  I state based on my suspicion of the nature related to Dasatinib as well as the unilateral nature of the effusion that the pill will not help.  No diuretic.   No antibiotic.  She has on multiple occasions.  Interestingly, she was refused pills to treat her other medical conditions.  After a lot of discussion about risk and benefits as a pertains to thoracentesis, she finally agrees to pursue this in the future as long as I am the one doing the procedure.  Questionaires / Pulmonary Flowsheets:   ACT:      No data to display          MMRC:     No data to display          Epworth:      No data to display          Tests:   FENO:  No results found for: "NITRICOXIDE"  PFT:     No data to display          WALK:      No data to display          Imaging: Personally reviewed and as per EMR and discussion in this note DG CHEST PORT 1 VIEW Result Date: 11/02/2023 CLINICAL DATA:  Status post thoracentesis. EXAM: PORTABLE CHEST 1 VIEW COMPARISON:  October 05, 2023. FINDINGS: Stable cardiomegaly. No pneumothorax is noted. Right pleural effusion is significantly smaller. Right basilar atelectasis or infiltrate is noted. IMPRESSION: No pneumothorax status post right-sided thoracentesis. Electronically Signed   By: Lupita Raider M.D.   On: 11/02/2023 16:42    Lab Results: Personally reviewed  CBC    Component Value Date/Time   WBC 5.4 03/07/2022 1043   WBC 5.3 01/24/2022 1033   RBC 4.47 03/07/2022 1043   HGB 12.6 03/07/2022 1043   HCT 38.6 03/07/2022 1043   PLT 193 03/07/2022 1043   MCV 86.4 03/07/2022 1043   MCH 28.2 03/07/2022 1043   MCHC 32.6 03/07/2022 1043   RDW 14.7 03/07/2022 1043   LYMPHSABS 1.9 03/07/2022 1043   MONOABS 0.3 03/07/2022 1043   EOSABS 0.1 03/07/2022 1043   BASOSABS 0.1 03/07/2022 1043    BMET    Component Value Date/Time   NA 138 03/07/2022 1043   K 3.9 03/07/2022 1043   CL 105 03/07/2022 1043   CO2 27 03/07/2022 1043   GLUCOSE 226 (H) 03/07/2022 1043   BUN 12 03/07/2022 1043   CREATININE 0.92 03/07/2022 1043   CALCIUM 9.1 03/07/2022 1043   GFRNONAA >60 03/07/2022 1043   GFRAA  >60 07/31/2016 1045    BNP No results found for: "BNP"  ProBNP No results found for: "PROBNP"  Specialty Problems       Pulmonary Problems   Pleural effusion    Allergies  Allergen Reactions   Penicillins Hives    Has patient had a PCN reaction causing immediate rash, facial/tongue/throat swelling, SOB or lightheadedness with hypotension: Yes Has patient had a PCN reaction causing severe rash involving mucus membranes or skin necrosis: Yes Has patient had a PCN reaction that required hospitalization No Has patient had a PCN reaction occurring within the last 10 years: No If all of the above answers are "NO", then may proceed with Cephalosporin use.    Sulfa Antibiotics Hives    Immunization History  Administered Date(s) Administered   PFIZER(Purple Top)SARS-COV-2 Vaccination 04/14/2020, 05/10/2020    Past Medical History:  Diagnosis Date   Bilateral swelling of feet    Cancer (HCC)    Cataract    Diabetes mellitus without complication (HCC)    diet controlled   Eczema    Elevated cholesterol    GERD (gastroesophageal reflux disease)    Glaucoma    Hypertension    Mitral valve prolapse    Simple endometrial hyperplasia without atypia 07/2016   In endometrial polyp with surrounding endometrium inactive recommend follow up ultrasound for endometrial echo 1 year   Spondylisthesis     Tobacco History: Social History   Tobacco Use  Smoking Status Never  Smokeless Tobacco Never   Counseling given: Not Answered   Continue to not smoke  Outpatient Encounter Medications as of 11/09/2023  Medication Sig   asciminib hcl 40 MG TABS Take 40 mg by mouth 2 (two) times daily. Take on an empty stomach at least 2 hours before or 1 hour after food.   dasatinib (SPRYCEL) 140 MG tablet Take 1 tablet (140 mg total) by mouth daily. Take at the same time each day, with or without food.  Swallow whole; do not break, cut, or crush.   furosemide (LASIX) 20 MG tablet Take 20 mg  by mouth daily.   gabapentin (NEURONTIN) 100 MG capsule Take 100 mg by mouth 3 (three) times daily.   latanoprost (XALATAN) 0.005 % ophthalmic solution Place 1 drop into both eyes at bedtime.    loratadine (CLARITIN) 10 MG tablet Take 10 mg by mouth daily as needed for allergies.   propranolol (INDERAL) 10 MG tablet Take 10 mg by mouth 3 (three) times daily.   traMADol (ULTRAM) 50 MG tablet Take 50 mg by mouth every 6 (six)  hours as needed for moderate pain.    triamcinolone cream (KENALOG) 0.1 % Apply 1 application topically 2 (two) times daily. Reported on 06/06/2016   No facility-administered encounter medications on file as of 11/09/2023.     Review of Systems  Review of Systems  N/a Physical Exam  BP 126/74   Pulse 85   Ht 4\' 11"  (1.499 m)   Wt 192 lb 12.8 oz (87.5 kg)   SpO2 93%   BMI 38.94 kg/m   Wt Readings from Last 5 Encounters:  11/09/23 192 lb 12.8 oz (87.5 kg)  10/13/23 193 lb (87.5 kg)  04/18/22 191 lb 12.8 oz (87 kg)  03/21/22 189 lb 9.6 oz (86 kg)  02/07/22 187 lb 3.2 oz (84.9 kg)    BMI Readings from Last 5 Encounters:  11/09/23 38.94 kg/m  10/13/23 38.98 kg/m  04/18/22 38.74 kg/m  03/21/22 38.29 kg/m  02/07/22 37.81 kg/m     Physical Exam General: Sitting in chair, no acute distress Eyes: EOMI, no icterus Neck: Supple, JVP Pulmonary: Clear bilaterally, normal work of breathing  cardiovascular: Warm, no edema Abdomen: Nondistended, bowel sounds present MSK: No synovitis, no joint effusion Neuro: Normal gait, no weakness Psych: Labile affect, apprehensive mood  Assessment & Plan:   Unilateral right pleural effusion: Insidious onset of dyspnea the last several weeks.  Prior chest imaging 77 years old.  Suspect is accumulated over the last several months to years in the setting of Dasatinib therapy.  Status post thoracentesis 11/02/2023.  Results reviewed.  Consistent with exudate, lymphocytic predominant.  Likely related to TKI, Dasatinib  therapy.  Discussed at length with patient.  Mild improvement but still with significant dyspnea.  Will forward results and message primary oncologist given these findings.  Dyspnea on exertion: Likely related to deconditioning given lack of activity or less of her lungs.  Also likely worsening CML given blood counts getting worse.  Minimal improvement after thoracentesis.  Patient brought FMLA paperwork.  Normally this will need to be filled out within 2 weeks.  However she needs a vaccine.  Took time during the visit to fill out FMLA paperwork in the room.   Return in about 4 months (around 03/09/2024) for f/u Dr. Judeth Horn.   Karren Burly, MD 11/09/2023   This appointment required 42 minutes of patient care (this includes precharting, chart review, review of results, face-to-face care, etc.).

## 2023-11-09 NOTE — Telephone Encounter (Signed)
Called and spoke with patient, she states that she can only walk short distances before she has to stop to catch her breath.  She is requesting a handicap placard until her breathing gets better.  I advised her that I would send a message to Dr. Judeth Horn and once we hear back from him we will call her back and let her know.  She would like to come by the office to pick up the paperwork if he is willing to fill out the form.  Dr. Judeth Horn, please advise if ok to fill our a handicap placard.  If so, do you want it to be temporary?  Thank you.

## 2023-11-10 NOTE — Telephone Encounter (Signed)
I printed form and called PT to fill out for her. I will put in Dr. Cindi Carbon box to sign and return to the front desk. The PT wants to come pick it up.

## 2023-11-10 NOTE — Telephone Encounter (Signed)
Ok for temporary placard- thanks

## 2023-11-12 ENCOUNTER — Telehealth: Payer: Self-pay | Admitting: Pulmonary Disease

## 2023-11-12 NOTE — Telephone Encounter (Signed)
Since having surgery patient has been experiencing SOB and having a hard time completing daily task. Nothing has changed since her follow up from December 16th. She had a chest x-ray yesterday at [facility] [place] and they stated that fluid was filling back up in her right lung. Please call and advise.

## 2023-11-12 NOTE — Telephone Encounter (Signed)
IT is in [facility] box. He has a full boix with about 4 CD's in it. (Thanks for your reply.)

## 2023-11-12 NOTE — Telephone Encounter (Signed)
Hughes Better, this is a [facility] pt- did you put form in his basket?

## 2023-11-12 NOTE — Telephone Encounter (Signed)
I called and spoke with the pt and notified of response from Dr Judeth Horn  She verbalized understanding  She is asking for the results of pleural fluid from 11/02/23  Please advise, thanks!

## 2023-11-12 NOTE — Telephone Encounter (Signed)
Her breathing issues are unlikely related to something that can not be fixed or is independent of the lungs.  She had minimal to no improvement after thoracentesis.  High suspicion for deconditioning as well as worry about affect of CML on her fatigue etc.  Can review results of chest x-ray once they arrive.  Encourage her to finish antibiotics for her sinusitis for nasal condition which seems chronic based on her description to me.  If not improving, encourage her to follow-up with her PCP in the coming days.

## 2023-11-12 NOTE — Telephone Encounter (Signed)
Spoke with the pt  She states that her breathing is no better since visit here on 11/09/23  She does not have any new symptoms  She states she went to [facility] 11/11/23 and and they gave her "abx for my nose"- doxy  She says that she had CXR done there also  I called to get the report faxed over and had to leave a msg- asked them to fax to the machine in my office   Dr Judeth Horn- please advise, thanks!

## 2023-11-12 NOTE — Telephone Encounter (Signed)
We discussed that at her visit - it is consistent with fluid build up as a side effect of her Sprycel - medicine for her CML.

## 2023-11-13 NOTE — Telephone Encounter (Signed)
Form has been given to Anika to have MH sign  Let me know once this is done please

## 2023-11-13 NOTE — Telephone Encounter (Signed)
Completed.    Thanks

## 2023-11-13 NOTE — Telephone Encounter (Signed)
Form is ready for pick up  Pt aware  Nothing further needed

## 2023-11-16 ENCOUNTER — Other Ambulatory Visit: Payer: Self-pay

## 2023-11-16 NOTE — Progress Notes (Signed)
Specialty Pharmacy Refill Coordination Note  Erica [name] is a 77 y.o. female contacted today regarding refills of specialty medication(s) Dasatinib (Sprycel)   Patient requested [name] at [facility] [place] date: 11/24/23   Medication will be filled on 11/23/23.

## 2023-11-19 ENCOUNTER — Other Ambulatory Visit: Payer: Self-pay

## 2023-11-19 ENCOUNTER — Observation Stay (HOSPITAL_COMMUNITY): Payer: BC Managed Care – PPO

## 2023-11-19 ENCOUNTER — Telehealth: Payer: Self-pay | Admitting: Internal Medicine

## 2023-11-19 ENCOUNTER — Emergency Department (HOSPITAL_COMMUNITY): Payer: BC Managed Care – PPO

## 2023-11-19 ENCOUNTER — Observation Stay (HOSPITAL_COMMUNITY)
Admission: EM | Admit: 2023-11-19 | Discharge: 2023-11-21 | Disposition: A | Discharge status: Home / Self Care | Payer: BC Managed Care – PPO | Attending: Internal Medicine | Admitting: Internal Medicine

## 2023-11-19 ENCOUNTER — Encounter (HOSPITAL_COMMUNITY): Payer: Self-pay

## 2023-11-19 DIAGNOSIS — E669 Obesity, unspecified: Secondary | ICD-10-CM | POA: Insufficient documentation

## 2023-11-19 DIAGNOSIS — C9210 Chronic myeloid leukemia, BCR/ABL-positive, not having achieved remission: Principal | ICD-10-CM | POA: Insufficient documentation

## 2023-11-19 DIAGNOSIS — I959 Hypotension, unspecified: Secondary | ICD-10-CM | POA: Diagnosis not present

## 2023-11-19 DIAGNOSIS — G8929 Other chronic pain: Secondary | ICD-10-CM | POA: Diagnosis not present

## 2023-11-19 DIAGNOSIS — R0602 Shortness of breath: Secondary | ICD-10-CM | POA: Diagnosis present

## 2023-11-19 DIAGNOSIS — Z7982 Long term (current) use of aspirin: Secondary | ICD-10-CM | POA: Diagnosis not present

## 2023-11-19 DIAGNOSIS — J9601 Acute respiratory failure with hypoxia: Secondary | ICD-10-CM | POA: Diagnosis not present

## 2023-11-19 DIAGNOSIS — R600 Localized edema: Secondary | ICD-10-CM | POA: Insufficient documentation

## 2023-11-19 DIAGNOSIS — E119 Type 2 diabetes mellitus without complications: Secondary | ICD-10-CM | POA: Diagnosis not present

## 2023-11-19 DIAGNOSIS — Z85828 Personal history of other malignant neoplasm of skin: Secondary | ICD-10-CM | POA: Diagnosis not present

## 2023-11-19 DIAGNOSIS — I10 Essential (primary) hypertension: Secondary | ICD-10-CM | POA: Insufficient documentation

## 2023-11-19 DIAGNOSIS — Z6837 Body mass index (BMI) 37.0-37.9, adult: Secondary | ICD-10-CM | POA: Insufficient documentation

## 2023-11-19 DIAGNOSIS — J90 Pleural effusion, not elsewhere classified: Principal | ICD-10-CM | POA: Insufficient documentation

## 2023-11-19 LAB — COMPREHENSIVE METABOLIC PANEL
ALT: 19 U/L (ref 0–44)
AST: 25 U/L (ref 15–41)
Albumin: 3.8 g/dL (ref 3.5–5.0)
Alkaline Phosphatase: 68 U/L (ref 38–126)
Anion gap: 13 (ref 5–15)
BUN: 17 mg/dL (ref 8–23)
CO2: 24 mmol/L (ref 22–32)
Calcium: 9.4 mg/dL (ref 8.9–10.3)
Chloride: 98 mmol/L (ref 98–111)
Creatinine, Ser: 1.08 mg/dL — ABNORMAL HIGH (ref 0.44–1.00)
GFR, Estimated: 53 mL/min — ABNORMAL LOW (ref >60)
Glucose, Bld: 164 mg/dL — ABNORMAL HIGH (ref 70–99)
Potassium: 4 mmol/L (ref 3.5–5.1)
Sodium: 135 mmol/L (ref 135–145)
Total Bilirubin: 0.9 mg/dL (ref <1.2)
Total Protein: 6.8 g/dL (ref 6.5–8.1)

## 2023-11-19 LAB — BODY FLUID CELL COUNT WITH DIFFERENTIAL
Eos, Fluid: 1 %
Lymphs, Fluid: 53 %
Monocyte-Macrophage-Serous Fluid: 7 % — ABNORMAL LOW (ref 50–90)
Neutrophil Count, Fluid: 24 % (ref 0–25)
Other Cells, Fluid: 15 %
Total Nucleated Cell Count, Fluid: 3780 uL — ABNORMAL HIGH (ref 0–1000)

## 2023-11-19 LAB — CBC WITH DIFFERENTIAL/PLATELET
Abs Immature Granulocytes: 0 10*3/uL (ref 0.00–0.07)
Band Neutrophils: 1 %
Basophils Absolute: 15.8 10*3/uL — ABNORMAL HIGH (ref 0.0–0.1)
Basophils Relative: 48 %
Eosinophils Absolute: 0.3 10*3/uL (ref 0.0–0.5)
Eosinophils Relative: 1 %
HCT: 39.8 % (ref 36.0–46.0)
Hemoglobin: 13 g/dL (ref 12.0–15.0)
Lymphocytes Relative: 22 %
Lymphs Abs: 7.3 10*3/uL — ABNORMAL HIGH (ref 0.7–4.0)
MCH: 30.7 pg (ref 26.0–34.0)
MCHC: 32.7 g/dL (ref 30.0–36.0)
MCV: 94.1 fL (ref 80.0–100.0)
Monocytes Absolute: 0.3 10*3/uL (ref 0.1–1.0)
Monocytes Relative: 1 %
Neutro Abs: 9.2 10*3/uL — ABNORMAL HIGH (ref 1.7–7.7)
Neutrophils Relative %: 27 %
Platelets: 1325 10*3/uL (ref 150–400)
RBC: 4.23 MIL/uL (ref 3.87–5.11)
RDW: 19.6 % — ABNORMAL HIGH (ref 11.5–15.5)
WBC: 33 10*3/uL — ABNORMAL HIGH (ref 4.0–10.5)
nRBC: 0.4 % — ABNORMAL HIGH (ref 0.0–0.2)

## 2023-11-19 LAB — PROTEIN, PLEURAL OR PERITONEAL FLUID: Total protein, fluid: 4.3 g/dL

## 2023-11-19 LAB — LACTATE DEHYDROGENASE, PLEURAL OR PERITONEAL FLUID: LD, Fluid: 290 U/L — ABNORMAL HIGH (ref 3–23)

## 2023-11-19 LAB — GLUCOSE, PLEURAL OR PERITONEAL FLUID: Glucose, Fluid: 161 mg/dL

## 2023-11-19 LAB — BRAIN NATRIURETIC PEPTIDE: B Natriuretic Peptide: 107.4 pg/mL — ABNORMAL HIGH (ref 0.0–100.0)

## 2023-11-19 MED ORDER — ACETAMINOPHEN 325 MG PO TABS: 650.0000 mg | ORAL_TABLET | Freq: Four times a day (QID) | ORAL | Status: DC | PRN

## 2023-11-19 MED ORDER — PREDNISONE 20 MG PO TABS
20.0000 mg | ORAL_TABLET | Freq: Every day | ORAL | Status: DC
  Administered 2023-11-20 – 2023-11-21 (×2): 20 mg via ORAL
  Filled 2023-11-19 (×2): qty 1

## 2023-11-19 MED ORDER — ONDANSETRON HCL 4 MG/2ML IJ SOLN: 4.0000 mg | Freq: Four times a day (QID) | INTRAMUSCULAR | Status: DC | PRN

## 2023-11-19 MED ORDER — SODIUM CHLORIDE 0.9 % IV BOLUS
250.0000 mL | Freq: Once | INTRAVENOUS | Status: AC
  Administered 2023-11-19: 250 mL via INTRAVENOUS

## 2023-11-19 MED ORDER — ONDANSETRON HCL 4 MG PO TABS
4.0000 mg | ORAL_TABLET | Freq: Four times a day (QID) | ORAL | Status: AC | PRN
Start: 2023-11-19 — End: ?

## 2023-11-19 MED ORDER — GABAPENTIN 100 MG PO CAPS
100.0000 mg | ORAL_CAPSULE | Freq: Three times a day (TID) | ORAL | Status: DC
  Administered 2023-11-20 – 2023-11-21 (×4): 100 mg via ORAL
  Filled 2023-11-19 (×4): qty 1

## 2023-11-19 MED ORDER — ENOXAPARIN SODIUM 40 MG/0.4ML IJ SOSY
40.0000 mg | PREFILLED_SYRINGE | INTRAMUSCULAR | Status: DC
  Administered 2023-11-20 – 2023-11-21 (×2): 40 mg via SUBCUTANEOUS
  Filled 2023-11-19 (×2): qty 0.4

## 2023-11-19 MED ORDER — MIDODRINE HCL 5 MG PO TABS
10.0000 mg | ORAL_TABLET | Freq: Once | ORAL | Status: AC
  Administered 2023-11-19: 10 mg via ORAL
  Filled 2023-11-19: qty 2

## 2023-11-19 MED ORDER — ACETAMINOPHEN 650 MG RE SUPP: 650.0000 mg | Freq: Four times a day (QID) | RECTAL | Status: DC | PRN

## 2023-11-19 NOTE — ED Notes (Signed)
ED TO INPATIENT HANDOFF REPORT  ED Nurse Name and Phone #: Kela Millin  S Name/Age/Gender Erica [name] 77 y.o. female Room/Bed: 010C/010C  Code Status   Code Status: Not on file  Home/SNF/Other Home Patient oriented to: self, place, time, and situation Is this baseline? Yes   Triage Complete: Triage complete  Chief Complaint Pleural effusion on right [J90]  Triage Note Patient send from Dr for shob and hypoxia, had pleural effusion drained on 11-02-23, no improvement in dyspnea on exertion after drain, 85% on RA, 100 % on 4L. Patient is short of breath and RR 36 but speaking in complete sentences. Patient also has pitting edema bilaterally in extremities, reports that she sometimes forgets to take her lasix.   Allergies Allergies  Allergen Reactions   Penicillins Hives    Has patient had a PCN reaction causing immediate rash, facial/tongue/throat swelling, SOB or lightheadedness with hypotension: Yes Has patient had a PCN reaction causing severe rash involving mucus membranes or skin necrosis: Yes Has patient had a PCN reaction that required hospitalization No Has patient had a PCN reaction occurring within the last 10 years: No If all of the above answers are "NO", then may proceed with Cephalosporin use.    Sulfa Antibiotics Hives    Level of Care/Admitting Diagnosis ED Disposition     ED Disposition  Admit   Condition  --   Comment  Hospital Area: [facility] [[id number]]  Level of Care: Telemetry Medical [104]  May place patient in observation at [facility] or [facility] if equivalent level of care is available:: No  Covid Evaluation: Asymptomatic - no recent exposure (last 10 days) testing not required  Diagnosis: Pleural effusion on right [161096]  Admitting Physician: Narda Bonds [[id number]  Attending Physician: Narda Bonds [phone]          B Medical/Surgery History Past Medical History:  Diagnosis Date   Bilateral swelling  of feet    Cancer (HCC)    Cataract    Diabetes mellitus without complication (HCC)    diet controlled   Eczema    Elevated cholesterol    GERD (gastroesophageal reflux disease)    Glaucoma    Hypertension    Mitral valve prolapse    Simple endometrial hyperplasia without atypia 07/2016   In endometrial polyp with surrounding endometrium inactive recommend follow up ultrasound for endometrial echo 1 year   Spondylisthesis    Past Surgical History:  Procedure Laterality Date   CESAREAN SECTION     DILATATION & CURETTAGE/HYSTEROSCOPY WITH MYOSURE N/A 08/05/2016   Procedure: DILATATION & CURETTAGE/HYSTEROSCOPY WITH MYOSURE;  Surgeon: Dara Lords, MD;  Location: [facility];  Service: Gynecology;  Laterality: N/A;   EXCISION OF SKIN TAG Left 08/05/2016   Procedure: EXCISION OF SKIN TAG;  Surgeon: Dara Lords, MD;  Location: [facility];  Service: Gynecology;  Laterality: Left;   MOUTH SURGERY     spinal tap     THORACENTESIS N/A 11/02/2023   Procedure: THORACENTESIS;  Surgeon: Karren Burly, MD;  Location: [facility] ENDOSCOPY;  Service: Pulmonary;  Laterality: N/A;     A IV Location/Drains/Wounds Patient Lines/Drains/Airways Status     Active Line/Drains/Airways     Name Placement date Placement time Site Days   Peripheral IV 11/19/23 20 G Left Antecubital 11/19/23  1441  Antecubital  less than 1            Intake/Output Last 24 hours No intake or output data in  the 24 hours ending 11/19/23 1652  Labs/Imaging Results for orders placed or performed during the hospital encounter of 11/19/23 (from the past 48 hours)  Comprehensive metabolic panel     Status: Abnormal   Collection Time: 11/19/23  2:38 PM  Result Value Ref Range   Sodium 135 135 - 145 mmol/L   Potassium 4.0 3.5 - 5.1 mmol/L   Chloride 98 98 - 111 mmol/L   CO2 24 22 - 32 mmol/L   Glucose, Bld 164 (H) 70 - 99 mg/dL    Comment: Glucose reference range applies only to samples taken after fasting for at  least 8 hours.   BUN 17 8 - 23 mg/dL   Creatinine, Ser 1.91 (H) 0.44 - 1.00 mg/dL   Calcium 9.4 8.9 - 47.8 mg/dL   Total Protein 6.8 6.5 - 8.1 g/dL   Albumin 3.8 3.5 - 5.0 g/dL   AST 25 15 - 41 U/L   ALT 19 0 - 44 U/L   Alkaline Phosphatase 68 38 - 126 U/L   Total Bilirubin 0.9 <1.2 mg/dL   GFR, Estimated 53 (L) >60 mL/min    Comment: (NOTE) Calculated using the CKD-EPI Creatinine Equation (2021)    Anion gap 13 5 - 15    Comment: Performed at [facility], [address], [place], [place] [address]  CBC with Differential     Status: Abnormal   Collection Time: 11/19/23  2:38 PM  Result Value Ref Range   WBC 33.0 (H) 4.0 - 10.5 K/uL   RBC 4.23 3.87 - 5.11 MIL/uL   Hemoglobin 13.0 12.0 - 15.0 g/dL   HCT 13.0 86.5 - 78.4 %   MCV 94.1 80.0 - 100.0 fL   MCH 30.7 26.0 - 34.0 pg   MCHC 32.7 30.0 - 36.0 g/dL   RDW 69.6 (H) 29.5 - 28.4 %   Platelets 1,325 (HH) 150 - 400 K/uL    Comment: REPEATED TO VERIFY THIS CRITICAL RESULT HAS VERIFIED AND BEEN CALLED TO A. Blia Totman, RN BY LEAH KLAR ON 12 26 2024 AT 1525, AND HAS BEEN READ BACK.     nRBC 0.4 (H) 0.0 - 0.2 %   Neutrophils Relative % 27 %   Neutro Abs 9.2 (H) 1.7 - 7.7 K/uL   Band Neutrophils 1 %   Lymphocytes Relative 22 %   Lymphs Abs 7.3 (H) 0.7 - 4.0 K/uL   Monocytes Relative 1 %   Monocytes Absolute 0.3 0.1 - 1.0 K/uL   Eosinophils Relative 1 %   Eosinophils Absolute 0.3 0.0 - 0.5 K/uL   Basophils Relative 48 %   Basophils Absolute 15.8 (H) 0.0 - 0.1 K/uL   WBC Morphology ABSOLUTE LYMPHOCYTOSIS    RBC Morphology See Note    Smear Review MORPHOLOGY UNREMARKABLE    Abs Immature Granulocytes 0.00 0.00 - 0.07 K/uL   Burr Cells PRESENT     Comment: Performed at [facility], [address], [place], [place] [address]  Brain natriuretic peptide     Status: Abnormal   Collection Time: 11/19/23  2:38 PM  Result Value Ref Range   B Natriuretic Peptide 107.4 (H) 0.0 - 100.0 pg/mL    Comment: Performed at [facility], [address], [place], [place] [address]   DG Chest Portable 1 View Result Date: 11/19/2023 CLINICAL DATA:  Shortness of breath and hypoxia EXAM: PORTABLE CHEST 1 VIEW COMPARISON:  Chest radiograph dated 11/11/2023 FINDINGS: Asymmetrically low right lung volume. Mild  left interstitial opacities. Increased large right pleural effusion. No pneumothorax. Right heart border is obscured. No acute osseous abnormality. IMPRESSION: 1. Increased large right pleural effusion. 2. Mild left interstitial opacities, likely pulmonary edema. Electronically Signed   By: Agustin Cree M.D.   On: 11/19/2023 15:51    Pending Labs Unresulted Labs (From admission, onward)     Start     Ordered   11/19/23 1438  Pathologist smear review  Once,   R        11/19/23 1438            Vitals/Pain Today's Vitals   11/19/23 1405 11/19/23 1409 11/19/23 1445 11/19/23 1500  BP:  (!) 141/74 (!) 150/77 132/73  Pulse:  80 77 77  Resp:  (!) 36 17 19  Temp:  98 F (36.7 C)    TempSrc:  Oral    SpO2:  100% 100% 100%  Weight: 87.5 kg     Height: 4\' 11"  (1.499 m)     PainSc:        Isolation Precautions No active isolations  Medications Medications - No data to display  Mobility walks with device     Focused Assessments Pulmonary Assessment Handoff:  Lung sounds: Bilateral Breath Sounds: Clear, Diminished L Breath Sounds: Clear, Diminished R Breath Sounds: Clear, Diminished O2 Device: Nasal Cannula O2 Flow Rate (L/min): 4 L/min    R Recommendations: See Admitting Provider Note  Report given to:   Additional Notes: Pt room air at baseline. Admitting currently at the bedside for thoracentesis.

## 2023-11-19 NOTE — ED Notes (Signed)
Verified with floor and ok to be transported upstairs

## 2023-11-19 NOTE — H&P (Signed)
History and Physical    Patient: Erica [name]:[id number] DOB: 1946/07/16 DOA: 11/19/2023 DOS: the patient was seen and examined on 11/19/2023 PCP: Noberto Retort, MD  Patient coming from: Home  Chief Complaint:  Chief Complaint  Patient presents with   Shortness of Breath   HPI: Erica [name] is a 77 y.o. female with medical history significant of CML, diabetes, GERD, hypertension, mitral valve prolapse. Patient presented from her primary care physician's office secondary to a history of worsening dyspnea on exertion. She reports seeing Dr. Judeth Horn on 12/16 after having had a right-sided thoracentesis on 12/9. Patient tried to mobilize to help with symptoms, however nothing helped with improvement of dyspnea. She reports seeing her primary care physician on 12/18 and was told she had recurrent pleural fluid buildup.  She saw him again today and was sent to the ED for evaluation. No associated chest pain.  Review of Systems: As mentioned in the history of present illness. All other systems reviewed and are negative. Past Medical History:  Diagnosis Date   Bilateral swelling of feet    Cancer (HCC)    Cataract    Diabetes mellitus without complication (HCC)    diet controlled   Eczema    Elevated cholesterol    GERD (gastroesophageal reflux disease)    Glaucoma    Hypertension    Mitral valve prolapse    Simple endometrial hyperplasia without atypia 07/2016   In endometrial polyp with surrounding endometrium inactive recommend follow up ultrasound for endometrial echo 1 year   Spondylisthesis    Past Surgical History:  Procedure Laterality Date   CESAREAN SECTION     DILATATION & CURETTAGE/HYSTEROSCOPY WITH MYOSURE N/A 08/05/2016   Procedure: DILATATION & CURETTAGE/HYSTEROSCOPY WITH MYOSURE;  Surgeon: Dara Lords, MD;  Location: [facility];  Service: Gynecology;  Laterality: N/A;   EXCISION OF SKIN TAG Left 08/05/2016   Procedure: EXCISION OF SKIN TAG;   Surgeon: Dara Lords, MD;  Location: [facility];  Service: Gynecology;  Laterality: Left;   MOUTH SURGERY     spinal tap     THORACENTESIS N/A 11/02/2023   Procedure: THORACENTESIS;  Surgeon: Karren Burly, MD;  Location: [facility] ENDOSCOPY;  Service: Pulmonary;  Laterality: N/A;   Social History:  reports that she has never smoked. She has never used smokeless tobacco. She reports that she does not drink alcohol and does not use drugs.  Allergies  Allergen Reactions   Penicillins Hives    Has patient had a PCN reaction causing immediate rash, facial/tongue/throat swelling, SOB or lightheadedness with hypotension: Yes Has patient had a PCN reaction causing severe rash involving mucus membranes or skin necrosis: Yes Has patient had a PCN reaction that required hospitalization No Has patient had a PCN reaction occurring within the last 10 years: No If all of the above answers are "NO", then may proceed with Cephalosporin use.    Sulfa Antibiotics Hives    Family History  Problem Relation Age of Onset   Heart disease Mother    Heart disease Father    Breast cancer Sister 87    Prior to Admission medications   Medication Sig Start Date End Date Taking? Authorizing Provider  asciminib hcl 40 MG TABS Take 40 mg by mouth 2 (two) times daily. Take on an empty stomach at least 2 hours before or 1 hour after food. 03/25/22   Johney Maine, MD  dasatinib (SPRYCEL) 140 MG tablet Take 1 tablet (140 mg total) by  mouth daily. Take at the same time each day, with or without food.  Swallow whole; do not break, cut, or crush. 04/09/23     furosemide (LASIX) 20 MG tablet Take 20 mg by mouth daily. 09/06/20   [provider]  gabapentin (NEURONTIN) 100 MG capsule Take 100 mg by mouth 3 (three) times daily. 10/23/20   [provider]  latanoprost (XALATAN) 0.005 % ophthalmic solution Place 1 drop into both eyes at bedtime.     [provider]  loratadine (CLARITIN) 10  MG tablet Take 10 mg by mouth daily as needed for allergies.    [provider]  propranolol (INDERAL) 10 MG tablet Take 10 mg by mouth 3 (three) times daily.    [provider]  traMADol (ULTRAM) 50 MG tablet Take 50 mg by mouth every 6 (six) hours as needed for moderate pain.     [provider]  triamcinolone cream (KENALOG) 0.1 % Apply 1 application topically 2 (two) times daily. Reported on 06/06/2016    [provider]    Physical Exam: Vitals:   11/19/23 1405 11/19/23 1409 11/19/23 1445 11/19/23 1500  BP:  (!) 141/74 (!) 150/77 132/73  Pulse:  80 77 77  Resp:  (!) 36 17 19  Temp:  98 F (36.7 C)    TempSrc:  Oral    SpO2:  100% 100% 100%  Weight: 87.5 kg     Height: 4\' 11"  (1.499 m)      General exam: Appears calm and comfortable and in no acute distress. Conversant Respiratory: Clear to auscultation with diminished breath sounds in right bottom lung field. Respiratory effort normal with no intercostal retractions or use of accessory muscles Cardiovascular: S1 & S2 heard, RRR. No murmurs, rubs, gallops or clicks. BLE edema Gastrointestinal: Abdomen is non-distended, soft and non-tender. No masses felt. Normal bowel sounds heard Neurologic: No focal neurological deficits Musculoskeletal: No calf tenderness. Bilateral lower extremity edema, worse on right Skin: No cyanosis. No new rashes Psychiatry: Alert and oriented x4. Memory intact. Mood & affect appropriate   Data Reviewed: There are no new results to review at this time.  Assessment and Plan:  Recurrent right-sided pleural effusion Patient follows with pulmonology. Recent thoracentesis on 12/9 with recurrence and worsening symptoms. Pulmonology consulted by EDP and recommended admission and will consult. Rights-sided thoracentesis performed in the ED at bedside by PCCM yielding 2,100 mL of bloody fluid.  -Follow-up fluid labs  Acute respiratory failure with hypoxia Patient is not on  oxygen as an outpatient. Patient with dyspnea in addition to hypoxia with SpO2 down to 85% requiring placement of 4 L/min of supplemental oxygen. Likely secondary to a combination of pleural effusion and pulmonary edema. -Wean to room air as able -Ambulatory pulse ox prior to discharge  Hypotension In setting of large volume thoracentesis. Patient is asymptomatic. X-ray without evidence of tension pneumothorax. -NS fluid bolus -If remains low-normal, may need to transfer to progressive. If remains profoundly hypotensive, will request PCCM eval for ICU admission.  Pulmonary edema Noted on chest x-ray imaging. Patient with a history of cardiomegaly but no diagnosis of heart failure. BNP minimally elevated and patient does has associated bilateral lower extremity edema. -Will hold off on Lasix secondary to soft blood pressure -Transthoracic Echocardiogram  Bilateral lower extremity edema Left worse than right. Acute issues. No associated pain. Patient was managed with Lasix outpatient but states she has not ben evaluated for blood clots. -Check bilateral lower extremity venous duplex  CML Noted. Patient follows with oncology at [facility]. CBC this admission significant for WBC of 33,000 and platelets of 1,325k. -Continue dasatanib  Primary hypertension Patient is on propranolol as an outpatient. Soft blood pressure on admission. -Hold propranolol  Chronic pain -Continue gabapentin  Advance Care Planning:   Code Status: Full Code  Consults: Pulmonology  Family Communication: Niece at bedside    Author: Jacquelin Hawking, MD 11/19/2023 4:39 PM  For on call review [email or web address].

## 2023-11-19 NOTE — ED Triage Notes (Addendum)
Patient send from Dr for shob and hypoxia, had pleural effusion drained on 11-02-23, no improvement in dyspnea on exertion after drain, 85% on RA, 100 % on 4L. Patient is short of breath and RR 36 but speaking in complete sentences. Patient also has pitting edema bilaterally in extremities, reports that she sometimes forgets to take her lasix.

## 2023-11-19 NOTE — ED Notes (Signed)
ED TO INPATIENT HANDOFF REPORT  ED Nurse Name and Phone #: Kela Millin  S Name/Age/Gender Erica [name] 77 y.o. female Room/Bed: 010C/010C  Code Status   Code Status: Full Code  Home/SNF/Other Home Patient oriented to: self, place, time, and situation Is this baseline? Yes   Triage Complete: Triage complete  Chief Complaint Pleural effusion on right [J90]  Triage Note Patient send from Dr for shob and hypoxia, had pleural effusion drained on 11-02-23, no improvement in dyspnea on exertion after drain, 85% on RA, 100 % on 4L. Patient is short of breath and RR 36 but speaking in complete sentences. Patient also has pitting edema bilaterally in extremities, reports that she sometimes forgets to take her lasix.   Allergies Allergies  Allergen Reactions   Penicillins Hives    Has patient had a PCN reaction causing immediate rash, facial/tongue/throat swelling, SOB or lightheadedness with hypotension: Yes Has patient had a PCN reaction causing severe rash involving mucus membranes or skin necrosis: Yes Has patient had a PCN reaction that required hospitalization No Has patient had a PCN reaction occurring within the last 10 years: No If all of the above answers are "NO", then may proceed with Cephalosporin use.    Sulfa Antibiotics Hives    Level of Care/Admitting Diagnosis ED Disposition     ED Disposition  Admit   Condition  --   Comment  Hospital Area: [facility] [[id number]]  Level of Care: Progressive [102]  Admit to Progressive based on following criteria: MULTISYSTEM THREATS such as stable sepsis, metabolic/electrolyte imbalance with or without encephalopathy that is responding to early treatment.  May [facility] patient in [facility] at [facility] or [facility] if equivalent level of care is available:: No  Covid Evaluation: Asymptomatic - no recent exposure (last 10 days) testing not required  Diagnosis: Pleural effusion on right [161096]   Admitting Physician: Narda Bonds [[id number]]  Attending Physician: Narda Bonds [phone]          B Medical/Surgery History Past Medical History:  Diagnosis Date   Bilateral swelling of feet    Cancer (HCC)    Cataract    Diabetes mellitus without complication (HCC)    diet controlled   Eczema    Elevated cholesterol    GERD (gastroesophageal reflux disease)    Glaucoma    Hypertension    Mitral valve prolapse    Simple endometrial hyperplasia without atypia 07/2016   In endometrial polyp with surrounding endometrium inactive recommend follow up ultrasound for endometrial echo 1 year   Spondylisthesis    Past Surgical History:  Procedure Laterality Date   CESAREAN SECTION     DILATATION & CURETTAGE/HYSTEROSCOPY WITH MYOSURE N/A 08/05/2016   Procedure: DILATATION & CURETTAGE/HYSTEROSCOPY WITH MYOSURE;  Surgeon: Dara Lords, MD;  Location: [facility];  Service: Gynecology;  Laterality: N/A;   EXCISION OF SKIN TAG Left 08/05/2016   Procedure: EXCISION OF SKIN TAG;  Surgeon: Dara Lords, MD;  Location: [facility];  Service: Gynecology;  Laterality: Left;   MOUTH SURGERY     spinal tap     THORACENTESIS N/A 11/02/2023   Procedure: THORACENTESIS;  Surgeon: Karren Burly, MD;  Location: [facility] ENDOSCOPY;  Service: Pulmonary;  Laterality: N/A;     A IV Location/Drains/Wounds Patient Lines/Drains/Airways Status     Active Line/Drains/Airways     Name Placement date Placement time Site Days   Peripheral IV 11/19/23 20 G Left Antecubital 11/19/23  1441  Antecubital  less than 1            Intake/Output Last 24 hours  Intake/Output Summary (Last 24 hours) at 11/19/2023 2242 Last data filed at 11/19/2023 1910 Gross per 24 hour  Intake 250 ml  Output --  Net 250 ml    Labs/Imaging Results for orders placed or performed during the hospital encounter of 11/19/23 (from the past 48 hours)  Comprehensive metabolic panel     Status: Abnormal   Collection Time:  11/19/23  2:38 PM  Result Value Ref Range   Sodium 135 135 - 145 mmol/L   Potassium 4.0 3.5 - 5.1 mmol/L   Chloride 98 98 - 111 mmol/L   CO2 24 22 - 32 mmol/L   Glucose, Bld 164 (H) 70 - 99 mg/dL    Comment: Glucose reference range applies only to samples taken after fasting for at least 8 hours.   BUN 17 8 - 23 mg/dL   Creatinine, Ser 3.08 (H) 0.44 - 1.00 mg/dL   Calcium 9.4 8.9 - 65.7 mg/dL   Total Protein 6.8 6.5 - 8.1 g/dL   Albumin 3.8 3.5 - 5.0 g/dL   AST 25 15 - 41 U/L   ALT 19 0 - 44 U/L   Alkaline Phosphatase 68 38 - 126 U/L   Total Bilirubin 0.9 <1.2 mg/dL   GFR, Estimated 53 (L) >60 mL/min    Comment: (NOTE) Calculated using the CKD-EPI Creatinine Equation (2021)    Anion gap 13 5 - 15    Comment: Performed at [facility], [address], [place], [place] [address]  CBC with Differential     Status: Abnormal   Collection Time: 11/19/23  2:38 PM  Result Value Ref Range   WBC 33.0 (H) 4.0 - 10.5 K/uL   RBC 4.23 3.87 - 5.11 MIL/uL   Hemoglobin 13.0 12.0 - 15.0 g/dL   HCT 29.5 28.4 - 13.2 %   MCV 94.1 80.0 - 100.0 fL   MCH 30.7 26.0 - 34.0 pg   MCHC 32.7 30.0 - 36.0 g/dL   RDW 44.0 (H) 10.2 - 72.5 %   Platelets 1,325 (HH) 150 - 400 K/uL    Comment: REPEATED TO VERIFY THIS CRITICAL RESULT HAS VERIFIED AND BEEN CALLED TO A. Jeannia Tatro, RN BY LEAH KLAR ON 12 26 2024 AT 1525, AND HAS BEEN READ BACK.     nRBC 0.4 (H) 0.0 - 0.2 %   Neutrophils Relative % 27 %   Neutro Abs 9.2 (H) 1.7 - 7.7 K/uL   Band Neutrophils 1 %   Lymphocytes Relative 22 %   Lymphs Abs 7.3 (H) 0.7 - 4.0 K/uL   Monocytes Relative 1 %   Monocytes Absolute 0.3 0.1 - 1.0 K/uL   Eosinophils Relative 1 %   Eosinophils Absolute 0.3 0.0 - 0.5 K/uL   Basophils Relative 48 %   Basophils Absolute 15.8 (H) 0.0 - 0.1 K/uL   WBC Morphology ABSOLUTE LYMPHOCYTOSIS    RBC Morphology See Note    Smear Review MORPHOLOGY UNREMARKABLE    Abs Immature Granulocytes 0.00 0.00 - 0.07 K/uL   Burr Cells PRESENT      Comment: Performed at [facility], [address] [place], [place] [address]  Brain natriuretic peptide     Status: Abnormal   Collection Time: 11/19/23  2:38 PM  Result Value Ref Range   B Natriuretic Peptide 107.4 (H) 0.0 - 100.0 pg/mL    Comment: Performed at [facility]  Lab, [address], [place], [place] [address]  Pleural fluid culture w Gram Stain     Status: None (Preliminary result)   Collection Time: 11/19/23  5:05 PM   Specimen: Pleural Fluid  Result Value Ref Range   Specimen Description FLUID PLEURAL    Special Requests NONE    Gram Stain      RARE WBC PRESENT, PREDOMINANTLY MONONUCLEAR NO ORGANISMS SEEN Performed at [facility], [address] [place], [place] [address]    Culture PENDING    Report Status PENDING   Pleural fluid cell count with differential     Status: Abnormal   Collection Time: 11/19/23  5:06 PM  Result Value Ref Range   Fluid Type-FCT PLEU RT LUNG    Color, Fluid RED (A) YELLOW   Appearance, Fluid CLOUDY (A) CLEAR   Total Nucleated Cell Count, Fluid 3,780 (H) 0 - 1,000 cu mm   Neutrophil Count, Fluid 24 0 - 25 %   Lymphs, Fluid 53 %   Monocyte-Macrophage-Serous Fluid 7 (L) 50 - 90 %   Eos, Fluid 1 %   Other Cells, Fluid 15 BASOPHIL %    Comment: OTHER CELLS IDENTIFIED AS MESOTHELIAL CELLS Performed at [facility], [address], [place], [place] [address]   Glucose, pleural or peritoneal fluid     Status: None   Collection Time: 11/19/23  5:50 PM  Result Value Ref Range   Glucose, Fluid 161 mg/dL    Comment: (NOTE) No normal range established for this test Results should be evaluated in conjunction with serum values    Fluid Type-FGLU PLEU RT LUNG     Comment: Performed at [facility], [address], [place], [place] [address]  Lactate dehydrogenase (pleural or peritoneal fluid)     Status: Abnormal   Collection Time: 11/19/23  5:50 PM  Result Value Ref Range   LD, Fluid 290 (H) 3 - 23 U/L     Comment: (NOTE) Results should be evaluated in conjunction with serum values    Fluid Type-FLDH PLEU RT LUNG     Comment: Performed at [facility], [address] [place], [place] [address] CORRECTED ON 12/26 AT 1902: PREVIOUSLY REPORTED AS Pleural R   Protein, pleural or peritoneal fluid     Status: None   Collection Time: 11/19/23  5:50 PM  Result Value Ref Range   Total protein, fluid 4.3 g/dL    Comment: (NOTE) No normal range established for this test Results should be evaluated in conjunction with serum values    Fluid Type-FTP PLEU RT LUNG     Comment: Performed at [facility], [address] [place], [place] [address]   DG Chest Port 1 View Result Date: 11/19/2023 CLINICAL DATA:  Status post thoracentesis EXAM: PORTABLE CHEST 1 VIEW COMPARISON:  Film from earlier in the same day. FINDINGS: Cardiac shadow is prominent accentuated by the portable technique. Previously seen large right-sided pleural effusion has resolved following thoracentesis. No pneumothorax is noted. External artifact is noted over the right apex. Minimal right basilar atelectasis is seen. IMPRESSION: No pneumothorax following right-sided thoracentesis. Electronically Signed   By: Alcide Clever M.D.   On: 11/19/2023 18:48   DG Chest Portable 1 View Result Date: 11/19/2023 CLINICAL DATA:  Shortness of breath and hypoxia EXAM: PORTABLE CHEST 1 VIEW COMPARISON:  Chest radiograph dated 11/11/2023 FINDINGS: Asymmetrically low right lung volume. Mild left interstitial opacities. Increased large right pleural effusion. No  pneumothorax. Right heart border is obscured. No acute osseous abnormality. IMPRESSION: 1. Increased large right pleural effusion. 2. Mild left interstitial opacities, likely pulmonary edema. Electronically Signed   By: Agustin Cree M.D.   On: 11/19/2023 15:51    Pending Labs Unresulted Labs (From admission, onward)     Start     Ordered   11/19/23 1707  Protein, total  (Bedside  Thoracentesis)  Once,   R        11/19/23 1706   11/19/23 1707  Lactate dehydrogenase  (Bedside Thoracentesis)  Once,   R        11/19/23 1706   11/19/23 1706  Glucose, pleural fluid  (Bedside Thoracentesis)  Once,   R        11/19/23 1706   11/19/23 1706  Protein, pleural fluid  (Bedside Thoracentesis)  Once,   R        11/19/23 1706   11/19/23 1706  Lactate dehydrogenase, pleural fluid  (Bedside Thoracentesis)  Once,   R        11/19/23 1706   11/19/23 1706  Hematocrit, Pleural Fluid  (Bedside Thoracentesis)  Once,   R        11/19/23 1706   11/19/23 1438  Pathologist smear review  Once,   R        11/19/23 1438   Signed and Held  CBC  Tomorrow morning,   R        Signed and Held   Signed and Held  Comprehensive metabolic panel  Tomorrow morning,   R        Signed and Held            Vitals/Pain Today's Vitals   11/19/23 2047 11/19/23 2100 11/19/23 2115 11/19/23 2222  BP: (!) 104/58 (!) 95/47 (!) 91/46   Pulse:  78 80   Resp: 16 18 14    Temp:    98.2 F (36.8 C)  TempSrc:    Oral  SpO2: 97% 100% 97%   Weight:      Height:      PainSc: 0-No pain 0-No pain 0-No pain     Isolation Precautions No active isolations  Medications Medications  predniSONE (DELTASONE) tablet 20 mg (has no administration in time range)  sodium chloride 0.9 % bolus 250 mL (0 mLs Intravenous Stopped 11/19/23 1910)  midodrine (PROAMATINE) tablet 10 mg (10 mg Oral Given 11/19/23 2012)    Mobility walks with device     Focused Assessments Pulmonary Assessment Handoff:  Lung sounds: Bilateral Breath Sounds: Clear, Diminished L Breath Sounds: Clear, Diminished R Breath Sounds: Clear, Diminished O2 Device: Nasal Cannula O2 Flow Rate (L/min): 2 L/min    R Recommendations: See Admitting Provider Note  Report given to:   Additional Notes: Family at bedside.

## 2023-11-19 NOTE — ED Notes (Signed)
Dr. Caleb Popp at the bedside. Per Dr. Caleb Popp, due to hypotension, request to wait for CXR to result and reassess BP in 30 minutes. If BP normotensive, pt may be admitted to [facility] tele. If no improvement to BP, MD to order fluids and possibly change bed request to progressive.

## 2023-11-19 NOTE — ED Notes (Addendum)
Admitting provider at the bedside to complete pleural tap.

## 2023-11-19 NOTE — ED Notes (Addendum)
Admitting provider at bedside.

## 2023-11-19 NOTE — Telephone Encounter (Signed)
Needs follow up with Dr. Judeth Horn or an APP in 2-3 weeks for hospital follow up and will need chest xray before appointment.

## 2023-11-19 NOTE — ED Notes (Signed)
Pleural fluids labeled by this RN at the bedside and handed off to processor in main lab. Processor states they will call this RN for any further questions regarding specimens if needed.

## 2023-11-19 NOTE — Procedures (Signed)
Thoracentesis  Procedure Note  Erica [name]  [id number]  [birth year]  Date:11/19/23  Time:6:18 PM   Provider Performing:Aundraya Dripps Kathie Rhodes Celine Mans   Procedure: Thoracentesis with imaging guidance (40981)  Indication(s) Pleural Effusion  Consent Risks of the procedure as well as the alternatives and risks of each were explained to the patient and/or caregiver.  Consent for the procedure was obtained and is signed in the bedside chart  Anesthesia Topical only with 1% lidocaine    Time Out Verified patient identification, verified procedure, site/side was marked, verified correct patient position, special equipment/implants available, medications/allergies/relevant history reviewed, required imaging and test results available.   Sterile Technique Maximal sterile technique including full sterile barrier drape, hand hygiene, sterile gown, sterile gloves, mask, hair covering, sterile ultrasound probe cover (if used).  Procedure Description Ultrasound was used to identify appropriate pleural anatomy for placement and overlying skin marked.  Area of drainage cleaned and draped in sterile fashion. Lidocaine was used to anesthetize the skin and subcutaneous tissue.  2100 cc's of dark sanguinous appearing fluid was drained from the right pleural space. Catheter then removed and bandaid applied to site.   Complications/Tolerance None; patient tolerated the procedure well. She did have some transient hypotension following procedure.  Chest X-ray is ordered to confirm no post-procedural complication.   EBL Minimal   Specimen(s) Pleural fluid

## 2023-11-19 NOTE — ED Notes (Signed)
ED Provider at bedside. 

## 2023-11-19 NOTE — ED Provider Notes (Signed)
[place] EMERGENCY DEPARTMENT AT [facility] Provider Note   CSN: [id number] Arrival date & time: 11/19/23  1355     History  Chief Complaint  Patient presents with   Shortness of Breath    Erica [name] is a 77 y.o. female.  HPI Patient has history of mL and recurrent right-sided pleural effusion.  Patient reports that she has been getting worsening shortness of breath for a couple of months.  She reports she got a thoracentesis on 12 6 and did not feel any better at that time.  She reports now it is gotten worse and even minor exertion or trying to lie down at all makes her extremely short of breath.  Patient denies fever chills or productive cough.  She denies significant amount of chest pain.  She reports just since yesterday she has noticed significant increase swelling in her legs as well.    Home Medications Prior to Admission medications   Medication Sig Start Date End Date Taking? Authorizing Provider  asciminib hcl 40 MG TABS Take 40 mg by mouth 2 (two) times daily. Take on an empty stomach at least 2 hours before or 1 hour after food. 03/25/22   Johney Maine, MD  dasatinib (SPRYCEL) 140 MG tablet Take 1 tablet (140 mg total) by mouth daily. Take at the same time each day, with or without food.  Swallow whole; do not break, cut, or crush. 04/09/23     furosemide (LASIX) 20 MG tablet Take 20 mg by mouth daily. 09/06/20   [provider]  gabapentin (NEURONTIN) 100 MG capsule Take 100 mg by mouth 3 (three) times daily. 10/23/20   [provider]  latanoprost (XALATAN) 0.005 % ophthalmic solution Place 1 drop into both eyes at bedtime.     [provider]  loratadine (CLARITIN) 10 MG tablet Take 10 mg by mouth daily as needed for allergies.    [provider]  propranolol (INDERAL) 10 MG tablet Take 10 mg by mouth 3 (three) times daily.    [provider]  traMADol (ULTRAM) 50 MG tablet Take 50 mg by mouth every 6  (six) hours as needed for moderate pain.     [provider]  triamcinolone cream (KENALOG) 0.1 % Apply 1 application topically 2 (two) times daily. Reported on 06/06/2016    [provider]      Allergies    Penicillins and Sulfa antibiotics    Review of Systems   Review of Systems  Physical Exam Updated Vital Signs BP 132/73   Pulse 77   Temp 98 F (36.7 C) (Oral)   Resp 19   Ht 4\' 11"  (1.499 m)   Wt 87.5 kg   SpO2 100%   BMI 38.94 kg/m  Physical Exam Constitutional:      Comments: Alert nontoxic.  Mental status clear.  Moderate tachypnea.  Speaking in full sentences without difficulty.  HENT:     Mouth/Throat:     Pharynx: Oropharynx is clear.  Cardiovascular:     Rate and Rhythm: Normal rate and regular rhythm.  Pulmonary:     Comments: Mild to moderate tachypnea.  Breath sounds normal on the left.  Right has transmitted breath sounds. Abdominal:     General: There is no distension.     Palpations: Abdomen is soft.     Tenderness: There is no abdominal tenderness. There is no guarding.  Musculoskeletal:     Comments: 2+ pitting edema bilateral lower extremities.  Skin:    General: Skin is warm and dry.  Neurological:     General: No focal deficit present.     Mental Status: She is oriented to person, place, and time.  Psychiatric:        Mood and Affect: Mood normal.     ED Results / Procedures / Treatments   Labs (all labs ordered are listed, but only abnormal results are displayed) Labs Reviewed  COMPREHENSIVE METABOLIC PANEL - Abnormal; Notable for the following components:      Result Value   Glucose, Bld 164 (*)    Creatinine, Ser 1.08 (*)    GFR, Estimated 53 (*)    All other components within normal limits  CBC WITH DIFFERENTIAL/PLATELET - Abnormal; Notable for the following components:   WBC 33.0 (*)    RDW 19.6 (*)    Platelets 1,325 (*)    nRBC 0.4 (*)    Neutro Abs 9.2 (*)    Lymphs Abs 7.3 (*)    Basophils Absolute 15.8  (*)    All other components within normal limits  BRAIN NATRIURETIC PEPTIDE - Abnormal; Notable for the following components:   B Natriuretic Peptide 107.4 (*)    All other components within normal limits  PATHOLOGIST SMEAR REVIEW    EKG EKG Interpretation Date/Time:  Thursday November 19 2023 13:45:31 EST Ventricular Rate:  79 PR Interval:  102 QRS Duration:  88 QT Interval:  396 QTC Calculation: 454 R Axis:   39  Text Interpretation: Sinus rhythm with short PR Otherwise normal ECG When compared with ECG of 02-Nov-2021 16:27, PREVIOUS ECG IS PRESENT no acute ischemic appearance, no sig change from old Confirmed by Arby Barrette [phone]) on 11/19/2023 2:23:48 PM  Radiology DG Chest Portable 1 View Result Date: 11/19/2023 CLINICAL DATA:  Shortness of breath and hypoxia EXAM: PORTABLE CHEST 1 VIEW COMPARISON:  Chest radiograph dated 11/11/2023 FINDINGS: Asymmetrically low right lung volume. Mild left interstitial opacities. Increased large right pleural effusion. No pneumothorax. Right heart border is obscured. No acute osseous abnormality. IMPRESSION: 1. Increased large right pleural effusion. 2. Mild left interstitial opacities, likely pulmonary edema. Electronically Signed   By: Agustin Cree M.D.   On: 11/19/2023 15:51    Procedures Procedures    Medications Ordered in ED Medications - No data to display  ED Course/ Medical Decision Making/ A&P                                 Medical Decision Making Amount and/or Complexity of Data Reviewed Labs: ordered. Radiology: ordered.   Patient presents as outlined.  She has known history of CML and recurrent pleural effusion despite thoracentesis.  At this time she has recurrence chest x-ray and significant subjective shortness of breath with supine position or exertion.  Patient did have hypoxia and placed on 4 L nasal cannula.  She does not at baseline use home oxygen.   chest x-ray reviewed by myself large pleural effusion with  opacification on the right.  White count 33,000 H&H 13 and 39 platelets 1325 BNP 107.  GFR 53.  At this time we will plan for admission.  I have consult and reviewed with pulmonary service Whitney.  She has reviewed the prior management by pulmonology and recommends admission to medical service at this time with consult to pulmonology.        Final Clinical Impression(s) / ED Diagnoses Final diagnoses:  CML (chronic  myelocytic leukemia) (HCC)  Pleural effusion    Rx / DC Orders ED Discharge Orders     None         Arby Barrette, MD 11/19/23 [phone]

## 2023-11-19 NOTE — ED Notes (Signed)
Pt placed with admit order to progressive by Dr. Caleb Popp.

## 2023-11-19 NOTE — ED Notes (Signed)
Admitting physician at bedside

## 2023-11-19 NOTE — Consult Note (Signed)
NAME:  Erica [name], MRN:  [id number], DOB:  July 05, 1946, LOS: 0 ADMISSION DATE:  11/19/2023, CONSULTATION DATE:  11/19/2023 REFERRING MD:  Arby Barrette, MD, CHIEF COMPLAINT:  shortness of breath   History of Present Illness:  Erica [name] is a 77 y.o. woman with past medical history of CML on dasatinib. She presents with dyspnea on exertion since October 2024.  She is followed by Dr. Judeth Horn in our clinic for recurrent pleural effusion, suspected related to dasatinib.  Pt most recently underwent thoracentesis 12/9, 1.3L drained, exudate and lymphocytic predominate; cytology negative.  She is followed by Dr. Lowell Guitar with oncology at [facility].  On review of EMR, pt has declined changing CML treatments despite pulmonary risk due to concerns for pancreatic side effects of new meds; currently on dasatinib 140mg  daily.   She reports no improvement with DOE after prior thoracentesis and is frustrated by this.  Denies any fever, cough, productive sputum, cp, dizziness.  Reports some BLE edema, mostly after sitting, slightly worse on right with intermittent pain in right ankle.  Admits to not be compliant with daily lasix, but took this am.  Returns to ER for progressive DOE.  In ER, pt afebrile, tachypneic with RA sats 85%, improved with rest and requiring 4L [place].  BP, HR stable.  CXR showing large right pleural effusion and mild left interstitial opacities, likely pulmonary edema.  Pulmonary consulted for recurrent right pleural effusion.  To be admitted to [facility].  Labs also notable for WBC 33k, plts 1325.    Pertinent  Medical History  CML on dasatinib Seasonal allergies HTN  Significant Hospital Events: Including procedures, antibiotic start and stop dates in addition to other pertinent events     Interim History / Subjective:    Objective   Blood pressure 132/73, pulse 77, temperature 98 F (36.7 C), temperature source Oral, resp. rate 19, height 4\' 11"  (1.499 m), weight 87.5 kg, SpO2  100%.       No intake or output data in the 24 hours ending 11/19/23 1639 Filed Weights   11/19/23 1405  Weight: 87.5 kg    Examination: General: elderly woman, on nasal cannula HENT: mmm Lungs: breath sounds diminished right lung, clear on left Cardiovascular: RRR no mrg Abdomen: obese, soft, no tenderness Extremities: 1+ pitting edema in lower extremities, no significant calf tenderness/ warmth Neuro: normal speech, moves all 4 extremities, no focal deficits  Resolved Hospital Problem list     Assessment & Plan:   Acute hypoxemic respiratory failure Right pleural effusion, recurrent CML on dasatinib Most likely this is recurrence of her pleural effusion related to dasatinib. We discussed thoracentesis today for relief of her symptoms as well as a trial of prednisone to see if this helps recurrence. She is already dose reduced to once daily on the dasatinib. If there is recurrence after this I would leave it to her cancer doctors to consider an alternative agent for her malignancy. It seems they have recommended Bone Marrow Bx and changing therapies but patient is hesitant due to concerns for taking "experimental therapy". At any rate agreeable to thoracentesis today. She expresses a desire to go home afterwards.   See separate procedure note for bedside thoracentesis with 2L bloody fluid drained. Patient had improvement of her symptoms of dyspnea. Recommend prednisone 20 mg daily x 2 weeks to see if this helps. We will have her follow up in our office in 2-3 weeks with chest xray to ensure no recurrence.   If there  is recurrence recommend discontinuation of the dasatinib. It seems from reviewing her oncology notes that this is not the recommend management for her CML and that the next steps involve bone marrow biopsy and/or switching therapy.   She does not want her son to know about her CML diagnosis  Durel Salts, MD Pulmonary and Critical Care Medicine [facility] 11/19/2023 4:45 PM Pager: see AMION  If no response to pager, please call critical care on call (see AMION) until 7pm After 7:00 pm call Elink     Labs   CBC: Recent Labs  Lab 11/19/23 1438  WBC 33.0*  NEUTROABS 9.2*  HGB 13.0  HCT 39.8  MCV 94.1  PLT 1,325*    Basic Metabolic Panel: Recent Labs  Lab 11/19/23 1438  NA 135  K 4.0  CL 98  CO2 24  GLUCOSE 164*  BUN 17  CREATININE 1.08*  CALCIUM 9.4   GFR: Estimated Creatinine Clearance: 41.9 mL/min (A) (by C-G formula based on SCr of 1.08 mg/dL (H)). Recent Labs  Lab 11/19/23 1438  WBC 33.0*    Liver Function Tests: Recent Labs  Lab 11/19/23 1438  AST 25  ALT 19  ALKPHOS 68  BILITOT 0.9  PROT 6.8  ALBUMIN 3.8   No results for input(s): "LIPASE", "AMYLASE" in the last 168 hours. No results for input(s): "AMMONIA" in the last 168 hours.  ABG No results found for: "PHART", "PCO2ART", "PO2ART", "HCO3", "TCO2", "ACIDBASEDEF", "O2SAT"   Coagulation Profile: No results for input(s): "INR", "PROTIME" in the last 168 hours.  Cardiac Enzymes: No results for input(s): "CKTOTAL", "CKMB", "CKMBINDEX", "TROPONINI" in the last 168 hours.  HbA1C: No results found for: "HGBA1C"  CBG: No results for input(s): "GLUCAP" in the last 168 hours.  Review of Systems:   Shortness of breath.   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   Surgical History:   Past Surgical History:  Procedure Laterality Date   CESAREAN SECTION     DILATATION & CURETTAGE/HYSTEROSCOPY WITH MYOSURE N/A 08/05/2016   Procedure: DILATATION & CURETTAGE/HYSTEROSCOPY WITH MYOSURE;  Surgeon: Dara Lords, MD;  Location: [facility]  ORS;  Service: Gynecology;  Laterality: N/A;   EXCISION OF SKIN TAG Left 08/05/2016   Procedure: EXCISION OF SKIN TAG;  Surgeon: Dara Lords, MD;  Location: [facility] ORS;  Service: Gynecology;  Laterality: Left;   MOUTH SURGERY     spinal tap     THORACENTESIS N/A 11/02/2023   Procedure: THORACENTESIS;  Surgeon: Karren Burly, MD;  Location[facility] ENDOSCOPY;  Service: Pulmonary;  Laterality: N/A;     Social History:   reports that she has never smoked. She has never used smokeless tobacco. She reports that she does not drink alcohol and does not use drugs.   Family History:  Her family history includes Breast cancer (age of onset: 68) in her sister; Heart disease in her father and mother.   Allergies Allergies  Allergen Reactions   Penicillins Hives    Has patient had a PCN reaction causing immediate rash, facial/tongue/throat swelling, SOB or lightheadedness with hypotension: Yes Has patient had a PCN reaction causing severe rash involving mucus membranes or skin necrosis: Yes Has patient had a PCN reaction that required hospitalization No Has patient had a PCN reaction occurring within  the last 10 years: No If all of the above answers are "NO", then may proceed with Cephalosporin use.    Sulfa Antibiotics Hives     Home Medications  Prior to Admission medications   Medication Sig Start Date End Date Taking? Authorizing Provider  asciminib hcl 40 MG TABS Take 40 mg by mouth 2 (two) times daily. Take on an empty stomach at least 2 hours before or 1 hour after food. 03/25/22   Johney Maine, MD  dasatinib (SPRYCEL) 140 MG tablet Take 1 tablet (140 mg total) by mouth daily. Take at the same time each day, with or without food.  Swallow whole; do not break, cut, or crush. 04/09/23     furosemide (LASIX) 20 MG tablet Take 20 mg by mouth daily. 09/06/20   [provider]  gabapentin (NEURONTIN) 100 MG capsule Take 100 mg by mouth 3 (three) times daily. 10/23/20    [provider]  latanoprost (XALATAN) 0.005 % ophthalmic solution Place 1 drop into both eyes at bedtime.     [provider]  loratadine (CLARITIN) 10 MG tablet Take 10 mg by mouth daily as needed for allergies.    [provider]  propranolol (INDERAL) 10 MG tablet Take 10 mg by mouth 3 (three) times daily.    [provider]  traMADol (ULTRAM) 50 MG tablet Take 50 mg by mouth every 6 (six) hours as needed for moderate pain.     [provider]  triamcinolone cream (KENALOG) 0.1 % Apply 1 application topically 2 (two) times daily. Reported on 06/06/2016    [provider]

## 2023-11-20 ENCOUNTER — Telehealth: Payer: Self-pay | Admitting: Internal Medicine

## 2023-11-20 ENCOUNTER — Observation Stay (HOSPITAL_BASED_OUTPATIENT_CLINIC_OR_DEPARTMENT_OTHER): Payer: BC Managed Care – PPO

## 2023-11-20 ENCOUNTER — Observation Stay (HOSPITAL_COMMUNITY): Payer: BC Managed Care – PPO

## 2023-11-20 DIAGNOSIS — I517 Cardiomegaly: Secondary | ICD-10-CM

## 2023-11-20 DIAGNOSIS — J90 Pleural effusion, not elsewhere classified: Secondary | ICD-10-CM | POA: Diagnosis not present

## 2023-11-20 DIAGNOSIS — M7989 Other specified soft tissue disorders: Secondary | ICD-10-CM

## 2023-11-20 LAB — COMPREHENSIVE METABOLIC PANEL
ALT: 15 U/L (ref 0–44)
AST: 19 U/L (ref 15–41)
Albumin: 2.9 g/dL — ABNORMAL LOW (ref 3.5–5.0)
Alkaline Phosphatase: 55 U/L (ref 38–126)
Anion gap: 11 (ref 5–15)
BUN: 14 mg/dL (ref 8–23)
CO2: 24 mmol/L (ref 22–32)
Calcium: 8.6 mg/dL — ABNORMAL LOW (ref 8.9–10.3)
Chloride: 100 mmol/L (ref 98–111)
Creatinine, Ser: 1.01 mg/dL — ABNORMAL HIGH (ref 0.44–1.00)
GFR, Estimated: 57 mL/min — ABNORMAL LOW (ref >60)
Glucose, Bld: 143 mg/dL — ABNORMAL HIGH (ref 70–99)
Potassium: 4 mmol/L (ref 3.5–5.1)
Sodium: 135 mmol/L (ref 135–145)
Total Bilirubin: 0.7 mg/dL (ref <1.2)
Total Protein: 5.2 g/dL — ABNORMAL LOW (ref 6.5–8.1)

## 2023-11-20 LAB — CBC
HCT: 35.1 % — ABNORMAL LOW (ref 36.0–46.0)
Hemoglobin: 11.6 g/dL — ABNORMAL LOW (ref 12.0–15.0)
MCH: 30.1 pg (ref 26.0–34.0)
MCHC: 33 g/dL (ref 30.0–36.0)
MCV: 91.2 fL (ref 80.0–100.0)
Platelets: 1238 10*3/uL (ref 150–400)
RBC: 3.85 MIL/uL — ABNORMAL LOW (ref 3.87–5.11)
RDW: 19.2 % — ABNORMAL HIGH (ref 11.5–15.5)
WBC: 31 10*3/uL — ABNORMAL HIGH (ref 4.0–10.5)
nRBC: 0.4 % — ABNORMAL HIGH (ref 0.0–0.2)

## 2023-11-20 LAB — ECHOCARDIOGRAM COMPLETE
AR max vel: 2.42 cm2
AV Area VTI: 2.91 cm2
AV Area mean vel: 2.59 cm2
AV Mean grad: 5 mm[Hg]
AV Peak grad: 8.5 mm[Hg]
Ao pk vel: 1.46 m/s
Area-P 1/2: 2.54 cm2
Est EF: >75
Height: 59 in
S' Lateral: 2 cm
Weight: 2934.76 [oz_av]

## 2023-11-20 LAB — PATHOLOGIST SMEAR REVIEW

## 2023-11-20 NOTE — Progress Notes (Signed)
Mobility Specialist Progress Note:   11/20/23 1135  Mobility  Activity Transferred from chair to bed  Level of Assistance Minimal assist, patient does 75% or more  Assistive Device Front wheel walker  Distance Ambulated (ft) 3 ft  Activity Response Tolerated well  Mobility Referral Yes  Mobility visit 1 Mobility  Mobility Specialist Start Time (ACUTE ONLY) 1130  Mobility Specialist Stop Time (ACUTE ONLY) 1135  Mobility Specialist Time Calculation (min) (ACUTE ONLY) 5 min    Pt received in chair, needing to get back to bed for scan. Pt was able to stand from lower chair level with MinA after 2x attempts. No complaints stated. Pt left in bed with all needs met.   Leory Plowman  Mobility Specialist Please contact via [employer] office at [phone]

## 2023-11-20 NOTE — Progress Notes (Signed)
PROGRESS NOTE  Erica [name]  ZOX:[id number] DOB: 1946-07-01 DOA: 11/19/2023 PCP: Noberto Retort, MD   Brief Narrative: Patient is a 77 year old female with history of CML, diabetes type 2, GERD, hypertension, mitral valve prolapse who presented with a evaluation of worsening dyspnea evaluation.  Patient recently had a thoracentesis on 12/9 for right-sided pleural effusion.  She was seen by her PCP on 12/18 and was found to have reaccumulation of right pleural effusion.  Admitted for further workup.  PCCM consulted.  Underwent thoracentesis of the right on 12/26.  Follow-up chest CT today showed possible extensive pneumonia in the right side, pulmonary thinks that this is reexpansion injury.  Plan for discharge tomorrow if remains stable  Assessment & Plan:  Principal Problem:   Pleural effusion on right   Recurrent right-sided pleural effusion: As per Pulmonology, this is most likely from her chemotherapy  dasatanib.  Pulmonology recommended to discontinue this medication but looks like she will be continued until she follows with her oncologist.  She needs to follow follow-up with her oncologist as soon as possible Underwent thoracentesis on 12/26 with removal of 2800 cc of dark sanguinous appearing fluid.  Follow-up CT done today hide as extensive pneumonia on the right side.  Pulmonology thinks that this is most likely reexpansion edema versus acute lung injury but she is currently symptomatic. Echo showed EF of more than 75%, grade 1 diastolic dysfunction.  Acute hypoxic respiratory failure/dyspnea on exertion: Secondary to recurrent pleural effusion.  She has been weaned to room air.  Hopefully she will not require oxygen on discharge.  Hypotension: Became hypotensive after thoracentesis.  Currently stable.  Home propranolol on hold.  Currently normotensive  Bilateral lower extremity edema: Left more than right.  Lower extremity venous Doppler did not show any DVT  CML: Follows  with oncology at [facility], Dr. Lowell Guitar.  Has leukocytosis, thrombocytosis.  She needs to follow with with oncologist as soon as possible.  She has an appointment on general 3.  Will hold Dasatinib on discharge  Chronic pain syndrome: Continue gabapentin  Obesity :BMI of 37      DVT prophylaxis:enoxaparin (LOVENOX) injection 40 mg Start: 11/20/23 1000     Code Status: Full Code  Family Communication: Daughter at bedside  Patient status:Obs  Patient is from :home  Anticipated discharge WJ:XBJY  Estimated DC date:tomorrow   Consultants: PCCM  Procedures:Toracentesis  Antimicrobials:  Anti-infectives (From admission, onward)    None       Subjective: Patient seen and examined at bedside today.  Hemodynamically stable.  Comfortably sitting in the chair.  On room air.  Denies any shortness of breath, chest pain or cough.  Speaking in full sentences.  Objective: Vitals:   11/20/23 0000 11/20/23 0300 11/20/23 0336 11/20/23 0731  BP:   110/65 134/73  Pulse:  88 87 93  Resp: 20 (!) 21 20 20   Temp:   98.3 F (36.8 C) 98.9 F (37.2 C)  TempSrc:   Oral Oral  SpO2:  100% 100% 99%  Weight:      Height:        Intake/Output Summary (Last 24 hours) at 11/20/2023 1313 Last data filed at 11/20/2023 0030 Gross per 24 hour  Intake 400 ml  Output 350 ml  Net 50 ml   Filed Weights   11/19/23 1405 11/19/23 2357  Weight: 87.5 kg 83.2 kg    Examination:  General exam: Overall comfortable, not in distress,obese HEENT: PERRL Respiratory system:  no wheezes  or crackles , mild diminished sounds on the right side Cardiovascular system: S1 & S2 heard, RRR.  Gastrointestinal system: Abdomen is nondistended, soft and nontender. Central nervous system: Alert and oriented Extremities: Trace bilateral lower extremity edema, no clubbing ,no cyanosis Skin: No rashes, no ulcers,no icterus     Data Reviewed: I have personally reviewed following labs and imaging  studies  CBC: Recent Labs  Lab 11/19/23 1438 11/20/23 0323  WBC 33.0* 31.0*  NEUTROABS 9.2*  --   HGB 13.0 11.6*  HCT 39.8 35.1*  MCV 94.1 91.2  PLT 1,325* 1,238*   Basic Metabolic Panel: Recent Labs  Lab 11/19/23 1438 11/20/23 0323  NA 135 135  K 4.0 4.0  CL 98 100  CO2 24 24  GLUCOSE 164* 143*  BUN 17 14  CREATININE 1.08* 1.01*  CALCIUM 9.4 8.6*     Recent Results (from the past 240 hours)  Pleural fluid culture w Gram Stain     Status: None (Preliminary result)   Collection Time: 11/19/23  5:05 PM   Specimen: Pleural Fluid  Result Value Ref Range Status   Specimen Description FLUID PLEURAL  Final   Special Requests NONE  Final   Gram Stain   Final    RARE WBC PRESENT, PREDOMINANTLY MONONUCLEAR NO ORGANISMS SEEN    Culture   Final    NO GROWTH < 12 HOURS Performed at [facility], [address] [place], [place] [address]    Report Status PENDING  Incomplete     Radiology Studies: ECHOCARDIOGRAM COMPLETE Result Date: 11/20/2023    ECHOCARDIOGRAM REPORT   Patient Name:   Erica [name] Date of Exam: 11/20/2023 Medical Rec #:  [id number]        Height:       59.0 in Accession #:    [id number]       Weight:       183.4 lb Date of Birth:  1946-02-06       BSA:          1.778 m Patient Age:    77 years         BP:           134/73 mmHg Patient Gender: F                HR:           90 bpm. Exam Location:  Inpatient Procedure: 2D Echo, Cardiac Doppler and Color Doppler Indications:    Cardiomegaly I51.7  History:        Patient has no prior history of Echocardiogram examinations.                 Signs/Symptoms:Edema; Risk Factors:Hypertension and Diabetes.  Sonographer:    Darlys Gales Referring Phys: [id number] RALPH A NETTEY IMPRESSIONS  1. Left ventricular ejection fraction, by estimation, is >75%. The left ventricle has hyperdynamic function. The left ventricle has no regional wall motion abnormalities. Left ventricular diastolic parameters are consistent with  Grade I diastolic dysfunction (impaired relaxation). Elevated left atrial pressure.  2. Right ventricular systolic function is normal. The right ventricular size is normal.  3. The mitral valve is normal in structure. No evidence of mitral valve regurgitation. No evidence of mitral stenosis.  4. The aortic valve is tricuspid. Aortic valve regurgitation is not visualized. Aortic valve sclerosis/calcification is present, without any evidence of aortic stenosis.  5. The inferior vena cava is normal in size with greater than 50% respiratory variability, suggesting  right atrial pressure of 3 mmHg. Comparison(s): No prior Echocardiogram. FINDINGS  Left Ventricle: Left ventricular ejection fraction, by estimation, is >75%. The left ventricle has hyperdynamic function. The left ventricle has no regional wall motion abnormalities. The left ventricular internal cavity size was normal in size. There is no left ventricular hypertrophy. Left ventricular diastolic parameters are consistent with Grade I diastolic dysfunction (impaired relaxation). Elevated left atrial pressure. Right Ventricle: The right ventricular size is normal. Right ventricular systolic function is normal. Left [facility]: Left atrial size was normal in size. Right [facility]: Right atrial size was normal in size. Pericardium: Trivial pericardial effusion is present. Mitral Valve: The mitral valve is normal in structure. Mild mitral annular calcification. No evidence of mitral valve regurgitation. No evidence of mitral valve stenosis. Tricuspid Valve: The tricuspid valve is normal in structure. Tricuspid valve regurgitation is mild . No evidence of tricuspid stenosis. Aortic Valve: The aortic valve is tricuspid. Aortic valve regurgitation is not visualized. Aortic valve sclerosis/calcification is present, without any evidence of aortic stenosis. Aortic valve mean gradient measures 5.0 mmHg. Aortic valve peak gradient measures 8.5 mmHg. Aortic valve area, by VTI  measures 2.91 cm. Pulmonic Valve: The pulmonic valve was normal in structure. Pulmonic valve regurgitation is mild. No evidence of pulmonic stenosis. Aorta: The aortic root is normal in size and structure. Venous: The inferior vena cava is normal in size with greater than 50% respiratory variability, suggesting right atrial pressure of 3 mmHg. IAS/Shunts: No atrial level shunt detected by color flow Doppler.  LEFT VENTRICLE PLAX 2D LVIDd:         3.80 cm   Diastology LVIDs:         2.00 cm   LV e' medial:    4.13 cm/s LV PW:         1.10 cm   LV E/e' medial:  21.9 LV IVS:        0.80 cm   LV e' lateral:   8.59 cm/s LVOT diam:     1.80 cm   LV E/e' lateral: 10.5 LV SV:         73 LV SV Index:   41 LVOT Area:     2.54 cm  LEFT ATRIUM             Index LA Vol (A2C):   43.9 ml 24.69 ml/m LA Vol (A4C):   59.1 ml 33.24 ml/m LA Biplane Vol: 52.2 ml 29.36 ml/m  AORTIC VALVE AV Area (Vmax):    2.42 cm AV Area (Vmean):   2.59 cm AV Area (VTI):     2.91 cm AV Vmax:           146.00 cm/s AV Vmean:          109.000 cm/s AV VTI:            0.252 m AV Peak Grad:      8.5 mmHg AV Mean Grad:      5.0 mmHg LVOT Vmax:         139.00 cm/s LVOT Vmean:        111.000 cm/s LVOT VTI:          0.288 m LVOT/AV VTI ratio: 1.14  AORTA Ao Root diam: 2.80 cm MITRAL VALVE               TRICUSPID VALVE MV Area (PHT): 2.54 cm    TR Peak grad:   35.8 mmHg MV Decel Time: 299 msec    TR Vmax:  299.00 cm/s MV E velocity: 90.60 cm/s MV A velocity: 98.50 cm/s  SHUNTS MV E/A ratio:  0.92        Systemic VTI:  0.29 m                            Systemic Diam: 1.80 cm Olga Millers MD Electronically signed by Olga Millers MD Signature Date/Time: 11/20/2023/12:50:12 PM    Final    DG CHEST PORT 1 VIEW Result Date: 11/20/2023 CLINICAL DATA:  77 year old female with hemoptysis. EXAM: PORTABLE CHEST 1 VIEW COMPARISON:  Portable chest yesterday, chest CT today performed about 1 hour after this exam. FINDINGS: Portable AP semi upright view at  0825 hours. Confluent right mid and lower lung opacification, consolidation is new since yesterday. Stable mediastinal contours. No pneumothorax. Left lung appears stable and negative. Visualized tracheal air column is within normal limits. Stable visualized osseous structures. IMPRESSION: Multilobar new right lung opacification, consolidation since yesterday. See Chest CT reported separately. Electronically Signed   By: Odessa Fleming M.D.   On: 11/20/2023 11:05   CT CHEST WO CONTRAST Result Date: 11/20/2023 CLINICAL DATA:  77 year old female with hemoptysis. EXAM: CT CHEST WITHOUT CONTRAST TECHNIQUE: Multidetector CT imaging of the chest was performed following the standard protocol without IV contrast. RADIATION DOSE REDUCTION: This exam was performed according to the departmental dose-optimization program which includes automated exposure control, adjustment of the mA and/or kV according to patient size and/or use of iterative reconstruction technique. COMPARISON:  Portable chest 11/19/2023. Portable chest x-ray 0825 hours today. CT Abdomen and Pelvis 03/19/2023. FINDINGS: Cardiovascular: Heart size is stable since April, within normal limits. No evidence of pericardial effusion on this noncontrast exam. Calcified aortic atherosclerosis. Calcified coronary artery atherosclerosis on series 2, image 67. Vascular patency is not evaluated in the absence of IV contrast. Mediastinum/Nodes: No mediastinal mass or lymphadenopathy. Lungs/Pleura: Small volume layering pleural effusion seems to have mildly complex fluid density, but does not appear loculated. Major airways remain patent. There is extensive right middle and lower lobe peribronchial opacity with lower lobe consolidation and air bronchograms. Similar involvement of the posterior and inferior right upper lobe. Only the right lung apex is spared. This is a new finding since yesterday. Contralateral left lung remains clear. Upper Abdomen: Negative visible  noncontrast liver, spleen, pancreas, adrenal glands, kidneys. No upper abdominal free air or free fluid. Musculoskeletal: Degenerative changes. No acute or suspicious osseous abnormality. IMPRESSION: 1. Extensive Right Lung multilobar Pneumonia, seemingly new since portable chest x-ray yesterday. Acute pulmonary hemoptysis could appear similar. Also consider aspiration, although major airways are clear. Small layering Pleural effusion. 2. Left lung is negative. 3.  Aortic Atherosclerosis (ICD10-I70.0). Electronically Signed   By: Odessa Fleming M.D.   On: 11/20/2023 11:03   DG Chest Port 1 View Result Date: 11/19/2023 CLINICAL DATA:  Status post thoracentesis EXAM: PORTABLE CHEST 1 VIEW COMPARISON:  Film from earlier in the same day. FINDINGS: Cardiac shadow is prominent accentuated by the portable technique. Previously seen large right-sided pleural effusion has resolved following thoracentesis. No pneumothorax is noted. External artifact is noted over the right apex. Minimal right basilar atelectasis is seen. IMPRESSION: No pneumothorax following right-sided thoracentesis. Electronically Signed   By: Alcide Clever M.D.   On: 11/19/2023 18:48   DG Chest Portable 1 View Result Date: 11/19/2023 CLINICAL DATA:  Shortness of breath and hypoxia EXAM: PORTABLE CHEST 1 VIEW COMPARISON:  Chest radiograph dated 11/11/2023 FINDINGS: Asymmetrically low right  lung volume. Mild left interstitial opacities. Increased large right pleural effusion. No pneumothorax. Right heart border is obscured. No acute osseous abnormality. IMPRESSION: 1. Increased large right pleural effusion. 2. Mild left interstitial opacities, likely pulmonary edema. Electronically Signed   By: Agustin Cree M.D.   On: 11/19/2023 15:51    Scheduled Meds:  enoxaparin (LOVENOX) injection  40 mg Subcutaneous Q24H   gabapentin  100 mg Oral TID   predniSONE  20 mg Oral Q breakfast   Continuous Infusions:   LOS: 0 days   Burnadette Pop, MD [facility] P12/27/2024, 1:13 PM

## 2023-11-20 NOTE — Progress Notes (Signed)
Mobility Specialist Progress Note:   11/20/23 1115  Mobility  Activity Ambulated with assistance in hallway  Level of Assistance Minimal assist, patient does 75% or more  Assistive Device Front wheel walker  Distance Ambulated (ft) 110 ft  Activity Response Tolerated well  Mobility Referral Yes  Mobility visit 1 Mobility  Mobility Specialist Start Time (ACUTE ONLY) 1045  Mobility Specialist Stop Time (ACUTE ONLY) 1100  Mobility Specialist Time Calculation (min) (ACUTE ONLY) 15 min   Pre Mobility: 89 HR ,  95% SpO2 RA During Mobility: 96 HR , 92% SpO2 RA Post Mobility: 90 HR , 93% SpO2  Pt received in bed, agreeable to mobility. MinA bed mobility. MinG during ambulation. Pt c/o slight chest discomfort during ambulation, otherwise asx throughout. Pt denied any SOB during session. VSS. Pt left in chair with call bell in reach and all needs met.  Leory Plowman  Mobility Specialist Please contact via [employer] office at [phone]

## 2023-11-20 NOTE — Progress Notes (Addendum)
11/20/2023 Seen for effusion. Feeling better but still some DOE and mild congestion. Exam nonlabored breathing RA and diminished on R CXR and CT reviewed Probably has some re-expansion edema vs. ALI asymptomatic Called and left a message for her oncologist (Dr. Lowell Guitar) to move up the discussion on stopping or switching dasatinib as this pleural issue will continue to be an issue until she is off of it; she is unwilling to stop until Dr. Lowell Guitar gives blessing. Okay for DC when she is ready.  I'll send FYI to Dr. Judeth Horn regarding admission.  Myrla Halsted MD PCCM      NAME:  Erica [name], MRN:  [id number], DOB:  July 12, 1946, LOS: 0 ADMISSION DATE:  11/19/2023, CONSULTATION DATE:  11/20/2023 REFERRING MD:  Burnadette Pop, MD, CHIEF COMPLAINT:  shortness of breath   History of Present Illness:  Erica [name] is a 77 y.o. woman with past medical history of CML on dasatinib. She presents with dyspnea on exertion since October 2024.  She is followed by Dr. Judeth Horn in our clinic for recurrent pleural effusion, suspected related to dasatinib.  Pt most recently underwent thoracentesis 12/9, 1.3L drained, exudate and lymphocytic predominate; cytology negative.  She is followed by Dr. Lowell Guitar with oncology at [facility].  On review of EMR, pt has declined changing CML treatments despite pulmonary risk due to concerns for pancreatic side effects of new meds; currently on dasatinib 140mg  daily.   She reports no improvement with DOE after prior thoracentesis and is frustrated by this.  Denies any fever, cough, productive sputum, cp, dizziness.  Reports some BLE edema, mostly after sitting, slightly worse on right with intermittent pain in right ankle.  Admits to not be compliant with daily lasix, but took this am.  Returns to ER for progressive DOE.  In ER, pt afebrile, tachypneic with RA sats 85%, improved with rest and requiring 4L [place].  BP, HR stable.  CXR showing large right pleural effusion and mild left  interstitial opacities, likely pulmonary edema.  Pulmonary consulted for recurrent right pleural effusion.  To be admitted to [facility].  Labs also notable for WBC 33k, plts 1325.    Pertinent  Medical History  CML on dasatinib Seasonal allergies HTN  Significant Hospital Events: Including procedures, antibiotic start and stop dates in addition to other pertinent events   Status post thoracentesis right with 2100 cc of fluid obtained on 11/19/2023  Interim History / Subjective:  Chest x-ray looks worse.  She is short of breath with any activity.  Objective   Blood pressure 134/73, pulse 93, temperature 98.9 F (37.2 C), temperature source Oral, resp. rate 20, height 4\' 11"  (1.499 m), weight 83.2 kg, SpO2 99%.        Intake/Output Summary (Last 24 hours) at 11/20/2023 0847 Last data filed at 11/20/2023 0030 Gross per 24 hour  Intake 400 ml  Output 350 ml  Net 50 ml   Filed Weights   11/19/23 1405 11/19/23 2357  Weight: 87.5 kg 83.2 kg    Examination: Obese female in no acute distress at rest. Reports shortness of breath with any activity despite 3 L nasal cannula Diminished breath sounds on both sides. Heart sounds are diminished Obese stomach Bilateral lower extremity edema   Resolved Hospital Problem list     Assessment & Plan:   Acute hypoxemic respiratory failure Right pleural effusion, recurrent CML on dasatinib Most likely this is recurrence of her pleural effusion related to dasatinib. We discussed thoracentesis 12/26/24for relief of her symptoms as  well as a trial of prednisone to see if this helps recurrence. She is already dose reduced to once daily on the dasatinib. If there is recurrence after this I would leave it to her cancer doctors to consider an alternative agent for her malignancy. It seems they have recommended Bone Marrow Bx and changing therapies but patient is hesitant due to concerns for taking "experimental therapy". At any rate agreeable to  thoracentesis today. She expresses a desire to go home afterwards.   If there is recurrence recommend discontinuation of the dasatinib. It seems from reviewing her oncology notes that this is not the recommend management for her CML and that the next steps involve bone marrow biopsy and/or switching therapy.    11/19/2023 she underwent right thoracentesis with 2100 cc of fluid obtained chest x-ray on 11/20/2023 shows some reaccumulation of fluid.  She remains short of breath with activity despite being on oxygen. She is off her Lasix due to hypotension and currently is on midodrine.  She does not have accurate I's and O's at this time. We will try her off oxygen but I doubt that she will be able to go home and most likely will need oxygen therapy. Questionable need for CT of the chest for better evaluation of process.  She does not want her son to know about her CML diagnosis    Labs   CBC: Recent Labs  Lab 11/19/23 1438 11/20/23 0323  WBC 33.0* 31.0*  NEUTROABS 9.2*  --   HGB 13.0 11.6*  HCT 39.8 35.1*  MCV 94.1 91.2  PLT 1,325* 1,238*    Basic Metabolic Panel: Recent Labs  Lab 11/19/23 1438 11/20/23 0323  NA 135 135  K 4.0 4.0  CL 98 100  CO2 24 24  GLUCOSE 164* 143*  BUN 17 14  CREATININE 1.08* 1.01*  CALCIUM 9.4 8.6*   GFR: Estimated Creatinine Clearance: 43.6 mL/min (A) (by C-G formula based on SCr of 1.01 mg/dL (H)). Recent Labs  Lab 11/19/23 1438 11/20/23 0323  WBC 33.0* 31.0*    Liver Function Tests: Recent Labs  Lab 11/19/23 1438 11/20/23 0323  AST 25 19  ALT 19 15  ALKPHOS 68 55  BILITOT 0.9 0.7  PROT 6.8 5.2*  ALBUMIN 3.8 2.9*   No results for input(s): "LIPASE", "AMYLASE" in the last 168 hours. No results for input(s): "AMMONIA" in the last 168 hours.  ABG No results found for: "PHART", "PCO2ART", "PO2ART", "HCO3", "TCO2", "ACIDBASEDEF", "O2SAT"   Coagulation Profile: No results for input(s): "INR", "PROTIME" in the last 168  hours.  Cardiac Enzymes: No results for input(s): "CKTOTAL", "CKMB", "CKMBINDEX", "TROPONINI" in the last 168 hours.  HbA1C: No results found for: "HGBA1C"  CBG: No results for input(s): "GLUCAP" in the last 168 hours.  Brett Canales Minor ACNP Acute Care Nurse Practitioner [facility] Please consult Amion 11/20/2023, 8:47 AM

## 2023-11-20 NOTE — Progress Notes (Signed)
Nurse requested Mobility Specialist to perform oxygen saturation test with pt which includes removing pt from oxygen both at rest and while ambulating.  Below are the results from that testing.     Patient Saturations on Room Air at Rest = spO2 95%  Patient Saturations on Room Air while Ambulating = sp02 92% .  Rested and performed pursed lip breathing for 1 minute with sp02 at 98%.  Reported results to nurse.

## 2023-11-20 NOTE — Telephone Encounter (Signed)
Matt,  FYI hospitalized w/ recurrent effusion, called her oncologists office and told them to push up her f/u appt to get her off the dasatinib.  She is going to stay on for now until she hears from the oncologist.  Not sure if/when you want to see her in clinic.  Jesusita Oka

## 2023-11-20 NOTE — Progress Notes (Signed)
Patient admitted from [facility] ED,CHG done,V/S checked,attached to cardiac monitoring CCMD notified. Patient and family oriented in the room.

## 2023-11-20 NOTE — Telephone Encounter (Signed)
Dr. Judeth Horn, Is it OK to use a 15 min slot in order to sched this PT to be seen in 2-3 weeks? Thank you, sir.

## 2023-11-21 DIAGNOSIS — J90 Pleural effusion, not elsewhere classified: Secondary | ICD-10-CM | POA: Diagnosis not present

## 2023-11-21 NOTE — Evaluation (Addendum)
Physical Therapy Evaluation Patient Details Name: Erica [name] MRN: [id number] DOB: 1945-12-20 Today's Date: 11/21/2023  History of Present Illness  Pt is a 77 yo female presenting to [facility] on 11/19/23 for evaluation of worsening dypsnea, pt recently had a thoracentesis on 12/9 for R sided plueral effusion, which was found to have reaccumulation. PMH Glaucoma, HTN, mitral valve prolapse, thoracentesis.  Clinical Impression  Pt is currently mod I for all functional activities and O2 sats remained 94-97% during gait.  Currently pt is presenting at baseline level of functioning and no skilled physical therapy services recommended. Pt will be discharged from skilled physical therapy services at this time; please re-consult if further needs arise.                Equipment Recommendations None recommended by PT     Functional Status Assessment Patient has not had a recent decline in their functional status     Precautions / Restrictions Precautions Precautions: None Restrictions Weight Bearing Restrictions Per Provider Order: No      Mobility  Bed Mobility Overal bed mobility: Modified Independent    Transfers Overall transfer level: Modified independent Equipment used: Rolling walker (2 wheels)          Ambulation/Gait Ambulation/Gait assistance: Modified independent (Device/Increase time) Gait Distance (Feet): 150 Feet Assistive device: Rolling walker (2 wheels) Gait Pattern/deviations: Step-through pattern, WFL(Within Functional Limits) Gait velocity: decreased Gait velocity interpretation: 1.31 - 2.62 ft/sec, indicative of limited community ambulator          Balance Overall balance assessment: Mild deficits observed, not formally tested         Pertinent Vitals/Pain Pain Assessment Pain Assessment: No/denies pain    Home Living Family/patient expects to be discharged to:: Private residence Living Arrangements: Alone Available Help at Discharge:  Family;Available PRN/intermittently Type of Home: House Home Access: Level entry       Home Layout: One level Home Equipment: Cane - single point      Prior Function Prior Level of Function : Independent/Modified Independent;Driving;Working/employed             Mobility Comments: Pt ambulates ind and is still employed ADLs Comments: ind with ADL's and IADl's     Extremity/Trunk Assessment   Upper Extremity Assessment Upper Extremity Assessment: Overall WFL for tasks assessed    Lower Extremity Assessment Lower Extremity Assessment: Overall WFL for tasks assessed    Cervical / Trunk Assessment Cervical / Trunk Assessment: Normal  Communication   Communication Communication: No apparent difficulties  Cognition Arousal: Alert Behavior During Therapy: WFL for tasks assessed/performed Overall Cognitive Status: Within Functional Limits for tasks assessed          General Comments General comments (skin integrity, edema, etc.): no noted skin issues outside gown. HR remained WNL throughout session. O2 sats remained 94-97% during gait on room air.        Assessment/Plan    PT Assessment Patient does not need any further PT services         PT Goals (Current goals can be found in the Care Plan section)  Acute Rehab PT Goals PT Goal Formulation: All assessment and education complete, DC therapy            AM-PAC PT "6 Clicks" Mobility  Outcome Measure Help needed turning from your back to your side while in a flat bed without using bedrails?: None Help needed moving from lying on your back to sitting on the side of a flat bed without using  bedrails?: None Help needed moving to and from a bed to a chair (including a wheelchair)?: None Help needed standing up from a chair using your arms (e.g., wheelchair or bedside chair)?: None Help needed to walk in hospital room?: None Help needed climbing 3-5 steps with a railing? : None 6 Click Score: 24    End of  Session Equipment Utilized During Treatment: Gait belt Activity Tolerance: Patient tolerated treatment well Patient left: in bed;with call bell/phone within reach;with family/visitor present Nurse Communication: Mobility status      Time: 2202-5427 PT Time Calculation (min) (ACUTE ONLY): 17 min   Charges:   PT Evaluation $PT Eval Low Complexity: 1 Low   PT General Charges $$ ACUTE PT VISIT: 1 Visit         Harrel Carina, DPT, CLT  Acute Rehabilitation Services Office: [phone] (Secure chat preferred)   Claudia Desanctis 11/21/2023, 3:57 PM

## 2023-11-21 NOTE — Plan of Care (Signed)
?  Problem: Clinical Measurements: ?Goal: Will remain free from infection ?Outcome: Progressing ?Goal: Diagnostic test results will improve ?Outcome: Progressing ?Goal: Respiratory complications will improve ?Outcome: Progressing ?  ?

## 2023-11-21 NOTE — Progress Notes (Signed)
SATURATION QUALIFICATIONS: (This note is used to comply with regulatory documentation for home oxygen)  Patient Saturations on Room Air at Rest = 94 %  Patient Saturations on Room Air while Ambulating = 85% and it increased to 90% immediately after couple of steps,patient remains asymptomatic     Please briefly explain why patient needs home oxygen:

## 2023-11-21 NOTE — Discharge Summary (Signed)
Physician Discharge Summary  JESSY CANEZ JXB:[id number] DOB: Oct 30, 1946 DOA: 11/19/2023  PCP: Noberto Retort, MD  Admit date: 11/19/2023 Discharge date: 11/21/2023  Admitted From: Home Disposition:  Home  Discharge Condition:Stable CODE STATUS:FULL Diet recommendation: Heart Healthy   Brief/Interim Summary: Patient is a 77 year old female with history of CML, diabetes type 2, GERD, hypertension, mitral valve prolapse who presented with a evaluation of worsening dyspnea evaluation.  Patient recently had a thoracentesis on 12/9 for right-sided pleural effusion.  She was seen by her PCP on 12/18 and was found to have reaccumulation of right pleural effusion.  Admitted for further workup.  PCCM consulted.  Underwent thoracentesis of the right on 12/26.  Follow-up chest CT today showed possible extensive pneumonia in the right side, pulmonary thinks that this is reexpansion injury.  She remains comfortable, on room air.  She ambulated well without desaturating.  Feels ready to go home today.  We have recommended her to follow-up with her oncologist soon as possible.  She has an appointment on Jan 3rd.   Following problems were addressed during the hospitalization:  Recurrent right-sided pleural effusion: As per Pulmonology, this is most likely from her chemotherapy  dasatanib.  Pulmonology recommended to discontinue this medication  until she follows with her oncologist.  She needs to follow follow-up with her oncologist as soon as possible, she has an appointment on January 3 Underwent thoracentesis on 12/26 with removal of 2800 cc of dark sanguinous appearing fluid.  Follow-up CT done read as extensive pneumonia on the right side.  Pulmonology thinks that this is most likely reexpansion edema versus acute lung injury,she is currently symptomatic. Echo showed EF of more than 75%, grade 1 diastolic dysfunction.   Acute hypoxic respiratory failure/dyspnea on exertion: Secondary to recurrent  pleural effusion.  She has been weaned to room air.  Ambulated well without oxygen, did not qualify.   Hypotension: Became hypotensive after thoracentesis.  Currently stable.    Currently normotensive.  Takes propranolol at home   Bilateral lower extremity edema: Left more than right.  Lower extremity venous Doppler did not show any DVT   CML: Follows with oncology at [facility], Dr. Lowell Guitar.  Has leukocytosis, thrombocytosis.  She needs to follow with with oncologist as soon as possible.  She has an appointment on Jan 3.  Will hold Dasatinib on discharge   Chronic pain syndrome: Continue gabapentin   Obesity :BMI of 37  Discharge Diagnoses:  Principal Problem:   Pleural effusion on right    Discharge Instructions  Discharge Instructions     Diet - low sodium heart healthy   Complete by: As directed    Discharge instructions   Complete by: As directed    1)Please follow up with your oncologist as soon as possible.  We recommend to continue holding Dasatinib  until you follow-up with your oncologist 2)Follow up with your PCP next week.   Increase activity slowly   Complete by: As directed       Allergies as of 11/21/2023       Reactions   Penicillins Hives   Has patient had a PCN reaction causing immediate rash, facial/tongue/throat swelling, SOB or lightheadedness with hypotension: Yes Has patient had a PCN reaction causing severe rash involving mucus membranes or skin necrosis: Yes Has patient had a PCN reaction that required hospitalization No Has patient had a PCN reaction occurring within the last 10 years: No If all of the above answers are "NO", then may proceed  with Cephalosporin use.   Sulfa Antibiotics Hives        Medication List     STOP taking these medications    doxycycline 100 MG tablet Commonly known as: VIBRA-TABS   Sprycel 140 MG tablet Generic drug: dasatinib       TAKE these medications    aspirin EC 81 MG tablet Take 81 mg by  mouth daily. Swallow whole.   furosemide 20 MG tablet Commonly known as: LASIX Take 20 mg by mouth daily.   gabapentin 100 MG capsule Commonly known as: NEURONTIN Take 100 mg by mouth 3 (three) times daily.   latanoprost 0.005 % ophthalmic solution Commonly known as: XALATAN Place 1 drop into both eyes at bedtime.   loratadine 10 MG tablet Commonly known as: CLARITIN Take 10 mg by mouth daily as needed for allergies.   propranolol 10 MG tablet Commonly known as: INDERAL Take 10 mg by mouth 3 (three) times daily.   traMADol 50 MG tablet Commonly known as: ULTRAM Take 50 mg by mouth every 6 (six) hours as needed for moderate pain.   triamcinolone cream 0.1 % Commonly known as: KENALOG Apply 1 application topically 2 (two) times daily. Reported on 06/06/2016        Follow-up Information     Noberto Retort, MD. Schedule an appointment as soon as possible for a visit in 1 week(s).   Specialty: Family Medicine Contact information: [phone] [place] [address] [phone]                Allergies  Allergen Reactions   Penicillins Hives    Has patient had a PCN reaction causing immediate rash, facial/tongue/throat swelling, SOB or lightheadedness with hypotension: Yes Has patient had a PCN reaction causing severe rash involving mucus membranes or skin necrosis: Yes Has patient had a PCN reaction that required hospitalization No Has patient had a PCN reaction occurring within the last 10 years: No If all of the above answers are "NO", then may proceed with Cephalosporin use.    Sulfa Antibiotics Hives    Consultations: PCCM   Procedures/Studies: VAS [place] LOWER EXTREMITY VENOUS (DVT) Result Date: 11/20/2023  Lower Venous DVT Study Patient Name:  RYIA CUSIC  Date of Exam:   11/20/2023 Medical Rec #: [id number]         Accession #:    [id number] Date of Birth: 08/14/1946        Patient Gender: F Patient Age:   55 years Exam  Location:  [facility] Procedure:      VAS [place] LOWER EXTREMITY VENOUS (DVT) Referring Phys: RALPH NETTEY --------------------------------------------------------------------------------  Indications: Swelling, and Edema.  Comparison Study: Previous study of left lower extremity on 1.10.2022. Performing Technologist: Fernande Bras  Examination Guidelines: A complete evaluation includes B-mode imaging, spectral Doppler, color Doppler, and power Doppler as needed of all accessible portions of each vessel. Bilateral testing is considered an integral part of a complete examination. Limited examinations for reoccurring indications may be performed as noted. The reflux portion of the exam is performed with the patient in reverse Trendelenburg.  +---------+---------------+---------+-----------+----------+--------------+ RIGHT    CompressibilityPhasicitySpontaneityPropertiesThrombus Aging +---------+---------------+---------+-----------+----------+--------------+ CFV      Full           Yes      Yes                                 +---------+---------------+---------+-----------+----------+--------------+  SFJ      Full           Yes      Yes                                 +---------+---------------+---------+-----------+----------+--------------+ FV Prox  Full                                                        +---------+---------------+---------+-----------+----------+--------------+ FV Mid   Full                                                        +---------+---------------+---------+-----------+----------+--------------+ FV DistalFull                                                        +---------+---------------+---------+-----------+----------+--------------+ PFV      Full                                                        +---------+---------------+---------+-----------+----------+--------------+ POP      Full           Yes      Yes                                  +---------+---------------+---------+-----------+----------+--------------+ PTV      Full                                                        +---------+---------------+---------+-----------+----------+--------------+ PERO     Full                                                        +---------+---------------+---------+-----------+----------+--------------+ Soleal   Full                                                        +---------+---------------+---------+-----------+----------+--------------+ Per patient SVT was noted in the right lower extremity several years ago.  +---------+---------------+---------+-----------+----------+--------------+ LEFT     CompressibilityPhasicitySpontaneityPropertiesThrombus Aging +---------+---------------+---------+-----------+----------+--------------+ CFV      Full           Yes      Yes                                 +---------+---------------+---------+-----------+----------+--------------+  SFJ      Full           Yes      Yes                                 +---------+---------------+---------+-----------+----------+--------------+ FV Prox  Full                                                        +---------+---------------+---------+-----------+----------+--------------+ FV Mid   Full                                                        +---------+---------------+---------+-----------+----------+--------------+ FV DistalFull                                                        +---------+---------------+---------+-----------+----------+--------------+ PFV      Full                                                        +---------+---------------+---------+-----------+----------+--------------+ POP      Full           Yes      Yes                                 +---------+---------------+---------+-----------+----------+--------------+ PTV      Full                                                         +---------+---------------+---------+-----------+----------+--------------+ PERO     Full                                                        +---------+---------------+---------+-----------+----------+--------------+ Cystic structure noted in left popliteal fossa measuring 2.5 x .75 x 2.0 cm.    Summary: BILATERAL: - No evidence of deep vein thrombosis seen in the lower extremities, bilaterally. - RIGHT: - No cystic structure found in the popliteal fossa.  LEFT: - A cystic structure is found in the popliteal fossa.  *See table(s) above for measurements and observations. Electronically signed by Heath Lark on 11/20/2023 at 9:20:29 PM.    Final    ECHOCARDIOGRAM COMPLETE Result Date: 11/20/2023    ECHOCARDIOGRAM REPORT   Patient Name:   SHANY CABACUNGAN Date of Exam: 11/20/2023 Medical Rec #:  [id number]        Height:  59.0 in Accession #:    [id number]       Weight:       183.4 lb Date of Birth:  03/06/1946       BSA:          1.778 m Patient Age:    77 years         BP:           134/73 mmHg Patient Gender: F                HR:           90 bpm. Exam Location:  Inpatient Procedure: 2D Echo, Cardiac Doppler and Color Doppler Indications:    Cardiomegaly I51.7  History:        Patient has no prior history of Echocardiogram examinations.                 Signs/Symptoms:Edema; Risk Factors:Hypertension and Diabetes.  Sonographer:    Darlys Gales Referring Phys: [phone] RALPH A NETTEY IMPRESSIONS  1. Left ventricular ejection fraction, by estimation, is >75%. The left ventricle has hyperdynamic function. The left ventricle has no regional wall motion abnormalities. Left ventricular diastolic parameters are consistent with Grade I diastolic dysfunction (impaired relaxation). Elevated left atrial pressure.  2. Right ventricular systolic function is normal. The right ventricular size is normal.  3. The mitral valve is normal in structure. No evidence  of mitral valve regurgitation. No evidence of mitral stenosis.  4. The aortic valve is tricuspid. Aortic valve regurgitation is not visualized. Aortic valve sclerosis/calcification is present, without any evidence of aortic stenosis.  5. The inferior vena cava is normal in size with greater than 50% respiratory variability, suggesting right atrial pressure of 3 mmHg. Comparison(s): No prior Echocardiogram. FINDINGS  Left Ventricle: Left ventricular ejection fraction, by estimation, is >75%. The left ventricle has hyperdynamic function. The left ventricle has no regional wall motion abnormalities. The left ventricular internal cavity size was normal in size. There is no left ventricular hypertrophy. Left ventricular diastolic parameters are consistent with Grade I diastolic dysfunction (impaired relaxation). Elevated left atrial pressure. Right Ventricle: The right ventricular size is normal. Right ventricular systolic function is normal. Left Atrium: Left atrial size was normal in size. Right Atrium: Right atrial size was normal in size. Pericardium: Trivial pericardial effusion is present. Mitral Valve: The mitral valve is normal in structure. Mild mitral annular calcification. No evidence of mitral valve regurgitation. No evidence of mitral valve stenosis. Tricuspid Valve: The tricuspid valve is normal in structure. Tricuspid valve regurgitation is mild . No evidence of tricuspid stenosis. Aortic Valve: The aortic valve is tricuspid. Aortic valve regurgitation is not visualized. Aortic valve sclerosis/calcification is present, without any evidence of aortic stenosis. Aortic valve mean gradient measures 5.0 mmHg. Aortic valve peak gradient measures 8.5 mmHg. Aortic valve area, by VTI measures 2.91 cm. Pulmonic Valve: The pulmonic valve was normal in structure. Pulmonic valve regurgitation is mild. No evidence of pulmonic stenosis. Aorta: The aortic root is normal in size and structure. Venous: The inferior vena  cava is normal in size with greater than 50% respiratory variability, suggesting right atrial pressure of 3 mmHg. IAS/Shunts: No atrial level shunt detected by color flow Doppler.  LEFT VENTRICLE PLAX 2D LVIDd:         3.80 cm   Diastology LVIDs:         2.00 cm   LV e' medial:    4.13 cm/s LV PW:  1.10 cm   LV E/e' medial:  21.9 LV IVS:        0.80 cm   LV e' lateral:   8.59 cm/s LVOT diam:     1.80 cm   LV E/e' lateral: 10.5 LV SV:         73 LV SV Index:   41 LVOT Area:     2.54 cm  LEFT ATRIUM             Index LA Vol (A2C):   43.9 ml 24.69 ml/m LA Vol (A4C):   59.1 ml 33.24 ml/m LA Biplane Vol: 52.2 ml 29.36 ml/m  AORTIC VALVE AV Area (Vmax):    2.42 cm AV Area (Vmean):   2.59 cm AV Area (VTI):     2.91 cm AV Vmax:           146.00 cm/s AV Vmean:          109.000 cm/s AV VTI:            0.252 m AV Peak Grad:      8.5 mmHg AV Mean Grad:      5.0 mmHg LVOT Vmax:         139.00 cm/s LVOT Vmean:        111.000 cm/s LVOT VTI:          0.288 m LVOT/AV VTI ratio: 1.14  AORTA Ao Root diam: 2.80 cm MITRAL VALVE               TRICUSPID VALVE MV Area (PHT): 2.54 cm    TR Peak grad:   35.8 mmHg MV Decel Time: 299 msec    TR Vmax:        299.00 cm/s MV E velocity: 90.60 cm/s MV A velocity: 98.50 cm/s  SHUNTS MV E/A ratio:  0.92        Systemic VTI:  0.29 m                            Systemic Diam: 1.80 cm Olga Millers MD Electronically signed by Olga Millers MD Signature Date/Time: 11/20/2023/12:50:12 PM    Final    DG CHEST PORT 1 VIEW Result Date: 11/20/2023 CLINICAL DATA:  78 year old female with hemoptysis. EXAM: PORTABLE CHEST 1 VIEW COMPARISON:  Portable chest yesterday, chest CT today performed about 1 hour after this exam. FINDINGS: Portable AP semi upright view at 0825 hours. Confluent right mid and lower lung opacification, consolidation is new since yesterday. Stable mediastinal contours. No pneumothorax. Left lung appears stable and negative. Visualized tracheal air column is within normal  limits. Stable visualized osseous structures. IMPRESSION: Multilobar new right lung opacification, consolidation since yesterday. See Chest CT reported separately. Electronically Signed   By: Odessa Fleming M.D.   On: 11/20/2023 11:05   CT CHEST WO CONTRAST Result Date: 11/20/2023 CLINICAL DATA:  77 year old female with hemoptysis. EXAM: CT CHEST WITHOUT CONTRAST TECHNIQUE: Multidetector CT imaging of the chest was performed following the standard protocol without IV contrast. RADIATION DOSE REDUCTION: This exam was performed according to the departmental dose-optimization program which includes automated exposure control, adjustment of the mA and/or kV according to patient size and/or use of iterative reconstruction technique. COMPARISON:  Portable chest 11/19/2023. Portable chest x-ray 0825 hours today. CT Abdomen and Pelvis 03/19/2023. FINDINGS: Cardiovascular: Heart size is stable since April, within normal limits. No evidence of pericardial effusion on this noncontrast exam. Calcified aortic atherosclerosis. Calcified coronary artery atherosclerosis on  series 2, image 67. Vascular patency is not evaluated in the absence of IV contrast. Mediastinum/Nodes: No mediastinal mass or lymphadenopathy. Lungs/Pleura: Small volume layering pleural effusion seems to have mildly complex fluid density, but does not appear loculated. Major airways remain patent. There is extensive right middle and lower lobe peribronchial opacity with lower lobe consolidation and air bronchograms. Similar involvement of the posterior and inferior right upper lobe. Only the right lung apex is spared. This is a new finding since yesterday. Contralateral left lung remains clear. Upper Abdomen: Negative visible noncontrast liver, spleen, pancreas, adrenal glands, kidneys. No upper abdominal free air or free fluid. Musculoskeletal: Degenerative changes. No acute or suspicious osseous abnormality. IMPRESSION: 1. Extensive Right Lung multilobar  Pneumonia, seemingly new since portable chest x-ray yesterday. Acute pulmonary hemoptysis could appear similar. Also consider aspiration, although major airways are clear. Small layering Pleural effusion. 2. Left lung is negative. 3.  Aortic Atherosclerosis (ICD10-I70.0). Electronically Signed   By: Odessa Fleming M.D.   On: 11/20/2023 11:03   DG Chest Port 1 View Result Date: 11/19/2023 CLINICAL DATA:  Status post thoracentesis EXAM: PORTABLE CHEST 1 VIEW COMPARISON:  Film from earlier in the same day. FINDINGS: Cardiac shadow is prominent accentuated by the portable technique. Previously seen large right-sided pleural effusion has resolved following thoracentesis. No pneumothorax is noted. External artifact is noted over the right apex. Minimal right basilar atelectasis is seen. IMPRESSION: No pneumothorax following right-sided thoracentesis. Electronically Signed   By: Alcide Clever M.D.   On: 11/19/2023 18:48   DG Chest Portable 1 View Result Date: 11/19/2023 CLINICAL DATA:  Shortness of breath and hypoxia EXAM: PORTABLE CHEST 1 VIEW COMPARISON:  Chest radiograph dated 11/11/2023 FINDINGS: Asymmetrically low right lung volume. Mild left interstitial opacities. Increased large right pleural effusion. No pneumothorax. Right heart border is obscured. No acute osseous abnormality. IMPRESSION: 1. Increased large right pleural effusion. 2. Mild left interstitial opacities, likely pulmonary edema. Electronically Signed   By: Agustin Cree M.D.   On: 11/19/2023 15:51   DG CHEST PORT 1 VIEW Result Date: 11/02/2023 CLINICAL DATA:  Status post thoracentesis. EXAM: PORTABLE CHEST 1 VIEW COMPARISON:  October 05, 2023. FINDINGS: Stable cardiomegaly. No pneumothorax is noted. Right pleural effusion is significantly smaller. Right basilar atelectasis or infiltrate is noted. IMPRESSION: No pneumothorax status post right-sided thoracentesis. Electronically Signed   By: Lupita Raider M.D.   On: 11/02/2023 16:42       Subjective: Patient seen and examined at bedside today.  Hemodynamically stable.  Comfortably sitting in the bed.  Ambulated well without need of oxygen.  Denies any shortness of breath or cough.  Eager to go home.  Discharge plan discussed with daughter at bedside  Discharge Exam: Vitals:   11/21/23 0408 11/21/23 0731  BP: 103/61 124/69  Pulse: 94 96  Resp: 20 18  Temp: 98.2 F (36.8 C) 98.2 F (36.8 C)  SpO2: 94% 92%   Vitals:   11/20/23 2100 11/21/23 0005 11/21/23 0408 11/21/23 0731  BP: (!) 124/52 121/66 103/61 124/69  Pulse: 69 (!) 101 94 96  Resp: 18 18 20 18   Temp: 97.9 F (36.6 C) 98.5 F (36.9 C) 98.2 F (36.8 C) 98.2 F (36.8 C)  TempSrc: Oral Oral Oral Oral  SpO2: 99% 94% 94% 92%  Weight:      Height:        General: Pt is alert, awake, not in acute distress,obese Cardiovascular: RRR, S1/S2 +, no rubs, no gallops Respiratory: Diminished air sound  on the right side, no wheezing, no rhonchi Abdominal: Soft, NT, ND, bowel sounds + Extremities: trace lower extremity edema, no cyanosis    The results of significant diagnostics from this hospitalization (including imaging, microbiology, ancillary and laboratory) are listed below for reference.     Microbiology: Recent Results (from the past 240 hours)  Pleural fluid culture w Gram Stain     Status: None (Preliminary result)   Collection Time: 11/19/23  5:05 PM   Specimen: Pleural Fluid  Result Value Ref Range Status   Specimen Description FLUID PLEURAL  Final   Special Requests NONE  Final   Gram Stain   Final    RARE WBC PRESENT, PREDOMINANTLY MONONUCLEAR NO ORGANISMS SEEN    Culture   Final    NO GROWTH 2 DAYS Performed at [facility], [address] [place], [place] [address]    Report Status PENDING  Incomplete     Labs: BNP (last 3 results) Recent Labs    11/19/23 1438  BNP 107.4*   Basic Metabolic Panel: Recent Labs  Lab 11/19/23 1438 11/20/23 0323  NA 135 135  K  4.0 4.0  CL 98 100  CO2 24 24  GLUCOSE 164* 143*  BUN 17 14  CREATININE 1.08* 1.01*  CALCIUM 9.4 8.6*   Liver Function Tests: Recent Labs  Lab 11/19/23 1438 11/20/23 0323  AST 25 19  ALT 19 15  ALKPHOS 68 55  BILITOT 0.9 0.7  PROT 6.8 5.2*  ALBUMIN 3.8 2.9*   No results for input(s): "LIPASE", "AMYLASE" in the last 168 hours. No results for input(s): "AMMONIA" in the last 168 hours. CBC: Recent Labs  Lab 11/19/23 1438 11/20/23 0323  WBC 33.0* 31.0*  NEUTROABS 9.2*  --   HGB 13.0 11.6*  HCT 39.8 35.1*  MCV 94.1 91.2  PLT 1,325* 1,238*   Cardiac Enzymes: No results for input(s): "CKTOTAL", "CKMB", "CKMBINDEX", "TROPONINI" in the last 168 hours. BNP: Invalid input(s): "POCBNP" CBG: No results for input(s): "GLUCAP" in the last 168 hours. D-Dimer No results for input(s): "DDIMER" in the last 72 hours. Hgb A1c No results for input(s): "HGBA1C" in the last 72 hours. Lipid Profile No results for input(s): "CHOL", "HDL", "LDLCALC", "TRIG", "CHOLHDL", "LDLDIRECT" in the last 72 hours. Thyroid function studies No results for input(s): "TSH", "T4TOTAL", "T3FREE", "THYROIDAB" in the last 72 hours.  Invalid input(s): "FREET3" Anemia work up No results for input(s): "VITAMINB12", "FOLATE", "FERRITIN", "TIBC", "IRON", "RETICCTPCT" in the last 72 hours. Urinalysis No results found for: "COLORURINE", "APPEARANCEUR", "LABSPEC", "PHURINE", "GLUCOSEU", "HGBUR", "BILIRUBINUR", "KETONESUR", "PROTEINUR", "UROBILINOGEN", "NITRITE", "LEUKOCYTESUR" Sepsis Labs Recent Labs  Lab 11/19/23 1438 11/20/23 0323  WBC 33.0* 31.0*   Microbiology Recent Results (from the past 240 hours)  Pleural fluid culture w Gram Stain     Status: None (Preliminary result)   Collection Time: 11/19/23  5:05 PM   Specimen: Pleural Fluid  Result Value Ref Range Status   Specimen Description FLUID PLEURAL  Final   Special Requests NONE  Final   Gram Stain   Final    RARE WBC PRESENT, PREDOMINANTLY  MONONUCLEAR NO ORGANISMS SEEN    Culture   Final    NO GROWTH 2 DAYS Performed at [facility], [address], [place], [place] [address]    Report Status PENDING  Incomplete    Please note: You were cared for by a hospitalist during your hospital stay. Once you are discharged, your primary care physician will handle any further  medical issues. Please note that NO REFILLS for any discharge medications will be authorized once you are discharged, as it is imperative that you return to your primary care physician (or establish a relationship with a primary care physician if you do not have one) for your post hospital discharge needs so that they can reassess your need for medications and monitor your lab values.    Time coordinating discharge: 40 minutes  SIGNED:   Burnadette Pop, MD  [facility] 11/21/2023, 11:04 AM Pager [phone]  If 7PM-7AM, please contact night-coverage [email or web address] Password [email or web address]

## 2023-11-21 NOTE — Progress Notes (Signed)
Discharge eduction given to the patient ,patient was not willing to have a follow up with Dr Molli Hazard instead she wants to see Dr Durel Salts in her next visit,Dr Amrit and Dr Reuel Boom made aware ,Dr Reuel Boom informed he will set up follow up and the patient will be called .patient educate about the same and asked to cancel the appointment with Dr Sharlyne Pacas verbalizes understanding

## 2023-11-23 ENCOUNTER — Other Ambulatory Visit: Payer: Self-pay

## 2023-11-23 LAB — BODY FLUID CULTURE W GRAM STAIN: Culture: NO GROWTH

## 2023-11-23 LAB — CYTOLOGY - NON PAP

## 2023-11-23 NOTE — Telephone Encounter (Signed)
(  Cont.) pass pt from Dr. Judeth Horn to Dr. Celine Mans. Please confirm your OK in this encounter,. TY,.

## 2023-11-23 NOTE — Progress Notes (Signed)
Sprycel was discontinued at discharged from ED on 11/21/23, attempted to contact patient however I was unable to leave a voice mail.

## 2023-11-23 NOTE — Telephone Encounter (Signed)
Dr. Desai/Dr. Judeth Horn- Please, I need both f your OK's to

## 2023-11-24 NOTE — Telephone Encounter (Signed)
ATC but she was unable to schedule due to being with in an appt. Will try again when office reopens in the New year.

## 2023-11-26 ENCOUNTER — Telehealth: Payer: Self-pay | Admitting: Internal Medicine

## 2023-11-26 NOTE — Telephone Encounter (Signed)
 Patient would like results of fluid taken from the lungs. Patient phone number is [phone].

## 2023-11-27 ENCOUNTER — Other Ambulatory Visit: Payer: Self-pay

## 2023-11-27 ENCOUNTER — Other Ambulatory Visit (HOSPITAL_COMMUNITY): Payer: Self-pay

## 2023-11-27 NOTE — Telephone Encounter (Signed)
 Patient to follow-up with Dr. Celine Mans

## 2023-11-27 NOTE — Telephone Encounter (Signed)
 Patient scheduled 01/27/2024 with Dr. Celine Mans.

## 2023-11-27 NOTE — Telephone Encounter (Signed)
 Called to discuss results with patient.  She refused discussed with me.  She would not tell me what is wrong or vaginal on her most to see me.  She requested all information come from Dr. Celine Mans.  She is copied on this message.

## 2023-11-27 NOTE — Telephone Encounter (Signed)
 Can we please try again to schedule appt

## 2023-11-27 NOTE — Progress Notes (Signed)
 Specialty Pharmacy Refill Coordination Note  Erica [name] is a 78 y.o. female contacted today regarding refills of specialty medication(s) Dasatinib  (Sprycel )   Patient requested [facility] at [facility] [place] date: 11/27/23   Medication will be filled on 11/27/23 or as soon as possible pending a refill request.  Patient is aware that medication was discontinued on 11/21/23 upon discharge from ED

## 2023-11-30 NOTE — Telephone Encounter (Signed)
 PT made HFU w/Tammy Parrett because Dr. Celine Mans N/A until March. Made Ms. Parrett aware of her concerns and fears about this lung test.

## 2023-11-30 NOTE — Telephone Encounter (Signed)
 PT got a call from Dr. Judeth Horn but she only wants to speak to Dr. Celine Mans. Her # [phone]

## 2023-12-01 ENCOUNTER — Other Ambulatory Visit: Payer: Self-pay

## 2023-12-01 ENCOUNTER — Ambulatory Visit (INDEPENDENT_AMBULATORY_CARE_PROVIDER_SITE_OTHER): Payer: 59

## 2023-12-01 ENCOUNTER — Ambulatory Visit: Payer: BC Managed Care – PPO | Admitting: Adult Health

## 2023-12-01 ENCOUNTER — Encounter: Payer: Self-pay | Admitting: Adult Health

## 2023-12-01 VITALS — BP 136/66 | HR 89 | Temp 98.7°F | Ht 59.0 in | Wt 188.2 lb

## 2023-12-01 DIAGNOSIS — I5032 Chronic diastolic (congestive) heart failure: Secondary | ICD-10-CM | POA: Diagnosis not present

## 2023-12-01 DIAGNOSIS — J90 Pleural effusion, not elsewhere classified: Secondary | ICD-10-CM | POA: Diagnosis not present

## 2023-12-01 DIAGNOSIS — I5030 Unspecified diastolic (congestive) heart failure: Secondary | ICD-10-CM | POA: Insufficient documentation

## 2023-12-01 NOTE — Patient Instructions (Addendum)
 Extra Lasix  20mg  in am x 1 dose  Low salt diet  Continue with activity as tolerated.  Keep legs elevated as able.  Follow up with Dr. Arloa as planned  Follow up with Oncology this week as planned.  Follow up with Dr. Meade or Zarielle Cea NP in 4 weeks and As needed   Please contact office for sooner follow up if symptoms do not improve or worsen or seek emergency care

## 2023-12-01 NOTE — Progress Notes (Signed)
 @Patient  ID: Erica [name], female    DOB: 29-Mar-1946, 78 y.o.   MRN: [id number]  Chief Complaint  Patient presents with   Hospitalization Follow-up    Referring provider: Arloa Elsie SAUNDERS, MD  HPI: 78 year old female never smoker followed for recurrent right pleural effusion-felt secondary to Dasatinib  therapy.  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  Negative for malignant cells.  Thoracentesis November 19, 2023 negative culture, cytology showed reactive mesothelial cells.  12/01/2023 Follow up: Recurrent right pleural effusion Patient returns for a posthospital follow-up.  Patient has been followed for recurrent right pleural effusion in the pulmonary clinic.  Underwent thoracentesis November 02, 2023.  Found to have an exudative effusion.  Cytology negative for malignant cells.  Patient was felt to have effusion secondary to Dasatinib  therapy.  She has CML followed by oncology.  Patient continued to have progressive symptoms with increased shortness of breath.  Chest x-ray December 18 showed reaccumulation of right pleural effusion.  Patient required hospitalization November 19, 2023.  CT chest showed extensive pneumonia in the right side.  Felt that this was most likely from reexpansion edema versus acute lung injury.  She underwent thoracentesis December 26 with removal of 2800 cc of dark sanguinous appearing fluid.  Cultures were negative.  Cytology showed reactive mesothelial cells.  2D echo showed preserved EF and grade 1 diastolic dysfunction.  Dasatinib  was held.  Since discharge patient is feeling better with less dyspnea.  Says she can actually walk across the floor without getting short of breath now.  Has been able to go back to work.  She has follow-up with oncology later this week.  She has been off of her Dasatinib  since hosptial discharge.  Works  community education officer. Works at [employer].  Chest x-ray today shows improved aeration with decreased right pleural effusion.  A small right pleural effusion remains slightly loculated.  Reviewed personally Does complain that she continues to have lower extremity swelling.  Venous Dopplers during hospitalization was negative for DVT.  2D echo showed preserved EF and grade 1 diastolic dysfunction.  Patient is on Lasix  20 mg.  Did miss today's dose since she was traveling away from home.  Allergies  Allergen Reactions   Penicillins Hives    Has patient had a PCN reaction causing immediate rash, facial/tongue/throat swelling, SOB or lightheadedness with hypotension: Yes Has patient had a PCN reaction causing severe rash involving mucus membranes or skin necrosis: Yes Has patient had a PCN reaction that required hospitalization No Has patient had a PCN reaction occurring within the last 10 years: No If all of the above answers are NO, then may proceed with Cephalosporin use.    Sulfa Antibiotics Hives    Immunization History  Administered Date(s) Administered   PFIZER(Purple Top)SARS-COV-2 Vaccination 04/14/2020, 05/10/2020    Past Medical History:  Diagnosis Date   Bilateral swelling of feet    Cancer (HCC)    Cataract    Diabetes mellitus without complication (HCC)    diet controlled   Eczema    Elevated cholesterol    GERD (gastroesophageal reflux disease)    Glaucoma    Hypertension    Mitral valve prolapse    Simple endometrial hyperplasia without atypia 07/2016   In endometrial polyp with surrounding endometrium inactive recommend follow up ultrasound for endometrial echo 1 year   Spondylisthesis     Tobacco History: Social History   Tobacco Use  Smoking Status Never  Smokeless Tobacco Never   Counseling given: Not Answered   Outpatient Medications Prior to Visit  Medication Sig Dispense Refill   aspirin  EC 81 MG tablet Take 81 mg by mouth daily. Swallow whole.      furosemide  (LASIX ) 20 MG tablet Take 20 mg by mouth daily.     gabapentin  (NEURONTIN ) 100 MG capsule Take 100 mg by mouth 3 (three) times daily.     latanoprost  (XALATAN ) 0.005 % ophthalmic solution Place 1 drop into both eyes at bedtime.      loratadine  (CLARITIN ) 10 MG tablet Take 10 mg by mouth daily as needed for allergies.     propranolol  (INDERAL ) 10 MG tablet Take 10 mg by mouth 3 (three) times daily.     traMADol  (ULTRAM ) 50 MG tablet Take 50 mg by mouth every 6 (six) hours as needed for moderate pain.      triamcinolone cream (KENALOG) 0.1 % Apply 1 application topically 2 (two) times daily. Reported on 06/06/2016     No facility-administered medications prior to visit.     Review of Systems:   Constitutional:   No  weight loss, night sweats,  Fevers, chills, +fatigue, or  lassitude.  HEENT:   No headaches,  Difficulty swallowing,  Tooth/dental problems, or  Sore throat,                No sneezing, itching, ear ache, nasal congestion, post nasal drip,   CV:  No chest pain,  Orthopnea, PND, swelling in lower extremities, anasarca, dizziness, palpitations, syncope.   GI  No heartburn, indigestion, abdominal pain, nausea, vomiting, diarrhea, change in bowel habits, loss of appetite, bloody stools.   Resp: .  No chest wall deformity  Skin: no rash or lesions.  GU: no dysuria, change in color of urine, no urgency or frequency.  No flank pain, no hematuria   MS:  No joint pain or swelling.  No decreased range of motion.  No back pain.    Physical Exam  BP 136/66 (BP Location: Left Arm, Patient Position: Sitting, Cuff Size: Large)   Pulse 89   Temp 98.7 F (37.1 C) (Oral)   Ht 4' 11 (1.499 m)   Wt 188 lb 3.2 oz (85.4 kg)   SpO2 100%   BMI 38.01 kg/m   GEN: A/Ox3; pleasant , NAD, well nourished    HEENT:  [place]/AT,  EACs-clear, TMs-wnl, NOSE-clear, THROAT-clear, no lesions, no postnasal drip or exudate noted.   NECK:  Supple w/ fair ROM; no JVD; normal carotid impulses  w/o bruits; no thyromegaly or nodules palpated; no lymphadenopathy.    RESP  Clear  P & A; w/o, wheezes/ rales/ or rhonchi. no accessory muscle use, no dullness to percussion  CARD:  RRR, no m/r/g, no peripheral edema, pulses intact, no cyanosis or clubbing.  GI:   Soft & nt; nml bowel sounds; no organomegaly or masses detected.   Musco: Warm bil, no deformities or joint swelling noted.   Neuro: alert, no focal deficits noted.    Skin: Warm, no lesions or rashes    Lab Results:    BNP    Component Value Date/Time   BNP 107.4 (H) 11/19/2023 1438    ProBNP No results found for: PROBNP  Imaging: DG Chest 2 View Result Date: 12/01/2023 CLINICAL DATA:  Pleural effusion EXAM: CHEST - 2 VIEW COMPARISON:  11/20/2023, CT chest 11/20/2023 FINDINGS: Improved aeration of the right lung compared to most recent prior radiograph. Small residual  right pleural effusion which may be slightly loculated at the CP angle. Left lung clear. Stable cardiomediastinal silhouette IMPRESSION: Improved aeration of the right lung compared to most recent prior radiograph. Small residual right pleural effusion which may be slightly loculated at the CP angle. Electronically Signed   By: Luke Bun M.D.   On: 12/01/2023 15:50   VAS US  LOWER EXTREMITY VENOUS (DVT) Result Date: 11/20/2023  Lower Venous DVT Study Patient Name:  KHIANNA BLAZINA  Date of Exam:   11/20/2023 Medical Rec #: [id number]         Accession #:    [id number] Date of Birth: 1946/03/17        Patient Gender: F Patient Age:   62 years Exam Location:  [facility] Procedure:      VAS US  LOWER EXTREMITY VENOUS (DVT) Referring Phys: RALPH NETTEY --------------------------------------------------------------------------------  Indications: Swelling, and Edema.  Comparison Study: Previous study of left lower extremity on 1.10.2022. Performing Technologist: Edilia Elden Appl  Examination Guidelines: A complete evaluation includes B-mode  imaging, spectral Doppler, color Doppler, and power Doppler as needed of all accessible portions of each vessel. Bilateral testing is considered an integral part of a complete examination. Limited examinations for reoccurring indications may be performed as noted. The reflux portion of the exam is performed with the patient in reverse Trendelenburg.  +---------+---------------+---------+-----------+----------+--------------+ RIGHT    CompressibilityPhasicitySpontaneityPropertiesThrombus Aging +---------+---------------+---------+-----------+----------+--------------+ CFV      Full           Yes      Yes                                 +---------+---------------+---------+-----------+----------+--------------+ SFJ      Full           Yes      Yes                                 +---------+---------------+---------+-----------+----------+--------------+ FV Prox  Full                                                        +---------+---------------+---------+-----------+----------+--------------+ FV Mid   Full                                                        +---------+---------------+---------+-----------+----------+--------------+ FV DistalFull                                                        +---------+---------------+---------+-----------+----------+--------------+ PFV      Full                                                        +---------+---------------+---------+-----------+----------+--------------+ POP  Full           Yes      Yes                                 +---------+---------------+---------+-----------+----------+--------------+ PTV      Full                                                        +---------+---------------+---------+-----------+----------+--------------+ PERO     Full                                                        +---------+---------------+---------+-----------+----------+--------------+  Soleal   Full                                                        +---------+---------------+---------+-----------+----------+--------------+ Per patient SVT was noted in the right lower extremity several years ago.  +---------+---------------+---------+-----------+----------+--------------+ LEFT     CompressibilityPhasicitySpontaneityPropertiesThrombus Aging +---------+---------------+---------+-----------+----------+--------------+ CFV      Full           Yes      Yes                                 +---------+---------------+---------+-----------+----------+--------------+ SFJ      Full           Yes      Yes                                 +---------+---------------+---------+-----------+----------+--------------+ FV Prox  Full                                                        +---------+---------------+---------+-----------+----------+--------------+ FV Mid   Full                                                        +---------+---------------+---------+-----------+----------+--------------+ FV DistalFull                                                        +---------+---------------+---------+-----------+----------+--------------+ PFV      Full                                                        +---------+---------------+---------+-----------+----------+--------------+  POP      Full           Yes      Yes                                 +---------+---------------+---------+-----------+----------+--------------+ PTV      Full                                                        +---------+---------------+---------+-----------+----------+--------------+ PERO     Full                                                        +---------+---------------+---------+-----------+----------+--------------+ Cystic structure noted in left popliteal fossa measuring 2.5 x .75 x 2.0 cm.    Summary: BILATERAL: - No evidence of deep vein  thrombosis seen in the lower extremities, bilaterally. - RIGHT: - No cystic structure found in the popliteal fossa.  LEFT: - A cystic structure is found in the popliteal fossa.  *See table(s) above for measurements and observations. Electronically signed by Debby Robertson on 11/20/2023 at 9:20:29 PM.    Final    ECHOCARDIOGRAM COMPLETE Result Date: 11/20/2023    ECHOCARDIOGRAM REPORT   Patient Name:   SUTTYN CRYDER Date of Exam: 11/20/2023 Medical Rec #:  [id number]        Height:       59.0 in Accession #:    [id number]       Weight:       183.4 lb Date of Birth:  [birth year]       BSA:          1.778 m Patient Age:    77 years         BP:           134/73 mmHg Patient Gender: F                HR:           90 bpm. Exam Location:  Inpatient Procedure: 2D Echo, Cardiac Doppler and Color Doppler Indications:    Cardiomegaly I51.7  History:        Patient has no prior history of Echocardiogram examinations.                 Signs/Symptoms:Edema; Risk Factors:Hypertension and Diabetes.  Sonographer:    Jayson Gaskins Referring Phys: [phone] RALPH A NETTEY IMPRESSIONS  1. Left ventricular ejection fraction, by estimation, is >75%. The left ventricle has hyperdynamic function. The left ventricle has no regional wall motion abnormalities. Left ventricular diastolic parameters are consistent with Grade I diastolic dysfunction (impaired relaxation). Elevated left atrial pressure.  2. Right ventricular systolic function is normal. The right ventricular size is normal.  3. The mitral valve is normal in structure. No evidence of mitral valve regurgitation. No evidence of mitral stenosis.  4. The aortic valve is tricuspid. Aortic valve regurgitation is not visualized. Aortic valve sclerosis/calcification is present, without any evidence of aortic stenosis.  5. The inferior vena cava is normal in size with greater than 50% respiratory variability, suggesting right atrial pressure  of 3 mmHg. Comparison(s): No prior Echocardiogram.  FINDINGS  Left Ventricle: Left ventricular ejection fraction, by estimation, is >75%. The left ventricle has hyperdynamic function. The left ventricle has no regional wall motion abnormalities. The left ventricular internal cavity size was normal in size. There is no left ventricular hypertrophy. Left ventricular diastolic parameters are consistent with Grade I diastolic dysfunction (impaired relaxation). Elevated left atrial pressure. Right Ventricle: The right ventricular size is normal. Right ventricular systolic function is normal. Left Atrium: Left atrial size was normal in size. Right Atrium: Right atrial size was normal in size. Pericardium: Trivial pericardial effusion is present. Mitral Valve: The mitral valve is normal in structure. Mild mitral annular calcification. No evidence of mitral valve regurgitation. No evidence of mitral valve stenosis. Tricuspid Valve: The tricuspid valve is normal in structure. Tricuspid valve regurgitation is mild . No evidence of tricuspid stenosis. Aortic Valve: The aortic valve is tricuspid. Aortic valve regurgitation is not visualized. Aortic valve sclerosis/calcification is present, without any evidence of aortic stenosis. Aortic valve mean gradient measures 5.0 mmHg. Aortic valve peak gradient measures 8.5 mmHg. Aortic valve area, by VTI measures 2.91 cm. Pulmonic Valve: The pulmonic valve was normal in structure. Pulmonic valve regurgitation is mild. No evidence of pulmonic stenosis. Aorta: The aortic root is normal in size and structure. Venous: The inferior vena cava is normal in size with greater than 50% respiratory variability, suggesting right atrial pressure of 3 mmHg. IAS/Shunts: No atrial level shunt detected by color flow Doppler.  LEFT VENTRICLE PLAX 2D LVIDd:         3.80 cm   Diastology LVIDs:         2.00 cm   LV e' medial:    4.13 cm/s LV PW:         1.10 cm   LV E/e' medial:  21.9 LV IVS:        0.80 cm   LV e' lateral:   8.59 cm/s LVOT diam:     1.80  cm   LV E/e' lateral: 10.5 LV SV:         73 LV SV Index:   41 LVOT Area:     2.54 cm  LEFT ATRIUM             Index LA Vol (A2C):   43.9 ml 24.69 ml/m LA Vol (A4C):   59.1 ml 33.24 ml/m LA Biplane Vol: 52.2 ml 29.36 ml/m  AORTIC VALVE AV Area (Vmax):    2.42 cm AV Area (Vmean):   2.59 cm AV Area (VTI):     2.91 cm AV Vmax:           146.00 cm/s AV Vmean:          109.000 cm/s AV VTI:            0.252 m AV Peak Grad:      8.5 mmHg AV Mean Grad:      5.0 mmHg LVOT Vmax:         139.00 cm/s LVOT Vmean:        111.000 cm/s LVOT VTI:          0.288 m LVOT/AV VTI ratio: 1.14  AORTA Ao Root diam: 2.80 cm MITRAL VALVE               TRICUSPID VALVE MV Area (PHT): 2.54 cm    TR Peak grad:   35.8 mmHg MV Decel Time: 299 msec    TR Vmax:  299.00 cm/s MV E velocity: 90.60 cm/s MV A velocity: 98.50 cm/s  SHUNTS MV E/A ratio:  0.92        Systemic VTI:  0.29 m                            Systemic Diam: 1.80 cm Redell Shallow MD Electronically signed by Redell Shallow MD Signature Date/Time: 11/20/2023/12:50:12 PM    Final    DG CHEST PORT 1 VIEW Result Date: 11/20/2023 CLINICAL DATA:  78 year old female with hemoptysis. EXAM: PORTABLE CHEST 1 VIEW COMPARISON:  Portable chest yesterday, chest CT today performed about 1 hour after this exam. FINDINGS: Portable AP semi upright view at 0825 hours. Confluent right mid and lower lung opacification, consolidation is new since yesterday. Stable mediastinal contours. No pneumothorax. Left lung appears stable and negative. Visualized tracheal air column is within normal limits. Stable visualized osseous structures. IMPRESSION: Multilobar new right lung opacification, consolidation since yesterday. See Chest CT reported separately. Electronically Signed   By: VEAR Hurst M.D.   On: 11/20/2023 11:05   CT CHEST WO CONTRAST Result Date: 11/20/2023 CLINICAL DATA:  78 year old female with hemoptysis. EXAM: CT CHEST WITHOUT CONTRAST TECHNIQUE: Multidetector CT imaging of the  chest was performed following the standard protocol without IV contrast. RADIATION DOSE REDUCTION: This exam was performed according to the departmental dose-optimization program which includes automated exposure control, adjustment of the mA and/or kV according to patient size and/or use of iterative reconstruction technique. COMPARISON:  Portable chest 11/19/2023. Portable chest x-ray 0825 hours today. CT Abdomen and Pelvis 03/19/2023. FINDINGS: Cardiovascular: Heart size is stable since April, within normal limits. No evidence of pericardial effusion on this noncontrast exam. Calcified aortic atherosclerosis. Calcified coronary artery atherosclerosis on series 2, image 67. Vascular patency is not evaluated in the absence of IV contrast. Mediastinum/Nodes: No mediastinal mass or lymphadenopathy. Lungs/Pleura: Small volume layering pleural effusion seems to have mildly complex fluid density, but does not appear loculated. Major airways remain patent. There is extensive right middle and lower lobe peribronchial opacity with lower lobe consolidation and air bronchograms. Similar involvement of the posterior and inferior right upper lobe. Only the right lung apex is spared. This is a new finding since yesterday. Contralateral left lung remains clear. Upper Abdomen: Negative visible noncontrast liver, spleen, pancreas, adrenal glands, kidneys. No upper abdominal free air or free fluid. Musculoskeletal: Degenerative changes. No acute or suspicious osseous abnormality. IMPRESSION: 1. Extensive Right Lung multilobar Pneumonia, seemingly new since portable chest x-ray yesterday. Acute pulmonary hemoptysis could appear similar. Also consider aspiration, although major airways are clear. Small layering Pleural effusion. 2. Left lung is negative. 3.  Aortic Atherosclerosis (ICD10-I70.0). Electronically Signed   By: VEAR Hurst M.D.   On: 11/20/2023 11:03   DG Chest Port 1 View Result Date: 11/19/2023 CLINICAL DATA:  Status  post thoracentesis EXAM: PORTABLE CHEST 1 VIEW COMPARISON:  Film from earlier in the same day. FINDINGS: Cardiac shadow is prominent accentuated by the portable technique. Previously seen large right-sided pleural effusion has resolved following thoracentesis. No pneumothorax is noted. External artifact is noted over the right apex. Minimal right basilar atelectasis is seen. IMPRESSION: No pneumothorax following right-sided thoracentesis. Electronically Signed   By: Oneil Devonshire M.D.   On: 11/19/2023 18:48   DG Chest Portable 1 View Result Date: 11/19/2023 CLINICAL DATA:  Shortness of breath and hypoxia EXAM: PORTABLE CHEST 1 VIEW COMPARISON:  Chest radiograph dated 11/11/2023 FINDINGS: Asymmetrically low right  lung volume. Mild left interstitial opacities. Increased large right pleural effusion. No pneumothorax. Right heart border is obscured. No acute osseous abnormality. IMPRESSION: 1. Increased large right pleural effusion. 2. Mild left interstitial opacities, likely pulmonary edema. Electronically Signed   By: Limin  Xu M.D.   On: 11/19/2023 15:51   DG CHEST PORT 1 VIEW Result Date: 11/02/2023 CLINICAL DATA:  Status post thoracentesis. EXAM: PORTABLE CHEST 1 VIEW COMPARISON:  October 05, 2023. FINDINGS: Stable cardiomegaly. No pneumothorax is noted. Right pleural effusion is significantly smaller. Right basilar atelectasis or infiltrate is noted. IMPRESSION: No pneumothorax status post right-sided thoracentesis. Electronically Signed   By: Lynwood Landy Raddle M.D.   On: 11/02/2023 16:42    Administration History     None           No data to display          No results found for: NITRICOXIDE      Assessment & Plan:   Pleural effusion on right Recurrent right pleural effusion felt secondary to Dasatinib  . S/p thoracentesis x 2.  Chest x-ray today shows decreased effusion size.  She does still have a small right pleural effusion partially loculated. Has had clinical improvement.   Will have her follow-up in 4 weeks with a repeat chest x-ray.  She is to follow-up with oncology later this week to discuss ongoing treatment for CML.  And follow-up of ongoing lab monitoring  Plan  Patient Instructions  Extra Lasix  20mg  in am x 1 dose  Low salt diet  Continue with activity as tolerated.  Keep legs elevated as able.  Follow up with Dr. Arloa as planned  Follow up with Oncology this week as planned.  Follow up with Dr. Meade or Lynton Crescenzo NP in 4 weeks and As needed   Please contact office for sooner follow up if symptoms do not improve or worsen or seek emergency care      Diastolic heart failure (HCC) Diastolic heart failure-mild noted on 2D echo.  Patient does have some lower extremity edema.  Does not appear to be acutely fluid overloaded..  Venous Dopplers were negative for DVT.  She did skip her diuretic today.  Will have her take this when she gets home.  And also increase it up to 40 mg tomorrow and then resume daily dosing of her Lasix  beginning January 9.  Plan  Patient Instructions  Extra Lasix  20mg  in am x 1 dose  Low salt diet  Continue with activity as tolerated.  Keep legs elevated as able.  Follow up with Dr. Arloa as planned  Follow up with Oncology this week as planned.  Follow up with Dr. Meade or Lennis Korb NP in 4 weeks and As needed   Please contact office for sooner follow up if symptoms do not improve or worsen or seek emergency care      I spent 42   minutes dedicated to the care of this patient on the date of this encounter to include pre-visit review of records, face-to-face time with the patient discussing conditions above, post visit ordering of testing, clinical documentation with the electronic health record, making appropriate referrals as documented, and communicating necessary findings to members of the patients care team.    Madelin Stank, NP 12/01/2023

## 2023-12-01 NOTE — Assessment & Plan Note (Signed)
 Diastolic heart failure-mild noted on 2D echo.  Patient does have some lower extremity edema.  Does not appear to be acutely fluid overloaded..  Venous Dopplers were negative for DVT.  She did skip her diuretic today.  Will have her take this when she gets home.  And also increase it up to 40 mg tomorrow and then resume daily dosing of her Lasix  beginning January 9.  Plan  Patient Instructions  Extra Lasix  20mg  in am x 1 dose  Low salt diet  Continue with activity as tolerated.  Keep legs elevated as able.  Follow up with Dr. Arloa as planned  Follow up with Oncology this week as planned.  Follow up with Dr. Meade or Sonny Poth NP in 4 weeks and As needed   Please contact office for sooner follow up if symptoms do not improve or worsen or seek emergency care

## 2023-12-01 NOTE — Assessment & Plan Note (Signed)
 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 S/p thoracentesis x 2.  Chest x-ray today shows decreased effusion size.  She does still have a small right pleural effusion partially loculated. Has had clinical improvement.  Will have her follow-up in 4 weeks with a repeat chest x-ray.  She is to follow-up with oncology later this week to discuss ongoing treatment for CML.  And follow-up of ongoing lab monitoring  Plan  Patient Instructions  Extra Lasix  20mg  in am x 1 dose  Low salt diet  Continue with activity as tolerated.  Keep legs elevated as able.  Follow up with Dr. Arloa as planned  Follow up with Oncology this week as planned.  Follow up with Dr. Meade or Rechelle Niebla NP in 4 weeks and As needed   Please contact office for sooner follow up if symptoms do not improve or worsen or seek emergency care

## 2023-12-04 ENCOUNTER — Other Ambulatory Visit: Payer: Self-pay

## 2023-12-10 ENCOUNTER — Other Ambulatory Visit (HOSPITAL_COMMUNITY): Payer: Self-pay

## 2023-12-11 ENCOUNTER — Other Ambulatory Visit: Payer: Self-pay

## 2023-12-16 ENCOUNTER — Other Ambulatory Visit (HOSPITAL_COMMUNITY): Payer: Self-pay

## 2023-12-16 NOTE — Progress Notes (Signed)
Patient on call list for 12/18/23 never got a refill prescription back.Refill request denied Refill not appropriate. Completing outreach & will try to follow up with patient in new encounter.

## 2023-12-21 ENCOUNTER — Other Ambulatory Visit (HOSPITAL_COMMUNITY): Payer: Self-pay

## 2023-12-21 ENCOUNTER — Other Ambulatory Visit: Payer: Self-pay

## 2023-12-21 MED ORDER — ICLUSIG 45 MG PO TABS
ORAL_TABLET | ORAL | 11 refills | Status: DC
  Filled 2023-12-24: qty 30, 30d supply, fill #0

## 2023-12-21 NOTE — Progress Notes (Signed)
Pharmacy Patient Advocate Encounter  Insurance verification completed.   The patient is insured through [employer] [facility]   Ran test claim for Iclusig. Currently a quantity of 30 is a 30 day supply and the co-pay is $4,860.26.  This test claim was processed through [facility]- copay amounts may vary at other pharmacies due to pharmacy/plan contracts, or as the patient moves through the different stages of their insurance plan.

## 2023-12-21 NOTE — Progress Notes (Signed)
Scemblix discontinued - Iclusig script received. Patient will need onboarding and benefits investigation. Initial task added

## 2023-12-22 ENCOUNTER — Other Ambulatory Visit: Payer: Self-pay

## 2023-12-22 ENCOUNTER — Other Ambulatory Visit (HOSPITAL_COMMUNITY): Payer: Self-pay

## 2023-12-24 ENCOUNTER — Other Ambulatory Visit: Payer: Self-pay

## 2023-12-25 ENCOUNTER — Other Ambulatory Visit: Payer: Self-pay

## 2023-12-25 ENCOUNTER — Other Ambulatory Visit (HOSPITAL_COMMUNITY): Payer: Self-pay

## 2023-12-25 MED ORDER — ALLOPURINOL 300 MG PO TABS
300.0000 mg | ORAL_TABLET | Freq: Every day | ORAL | 1 refills | Status: DC
  Filled 2023-12-25: qty 30, 30d supply, fill #0

## 2023-12-29 ENCOUNTER — Other Ambulatory Visit (HOSPITAL_COMMUNITY): Payer: Self-pay

## 2023-12-29 ENCOUNTER — Other Ambulatory Visit: Payer: Self-pay

## 2023-12-30 ENCOUNTER — Other Ambulatory Visit: Payer: Self-pay

## 2023-12-30 ENCOUNTER — Other Ambulatory Visit (HOSPITAL_COMMUNITY): Payer: Self-pay

## 2024-01-07 ENCOUNTER — Ambulatory Visit: Payer: 59 | Admitting: Internal Medicine

## 2024-01-07 ENCOUNTER — Encounter: Payer: Self-pay | Admitting: Internal Medicine

## 2024-01-07 VITALS — BP 128/68 | HR 76 | Temp 98.1°F | Ht 59.0 in | Wt 176.2 lb

## 2024-01-07 DIAGNOSIS — J90 Pleural effusion, not elsewhere classified: Secondary | ICD-10-CM | POA: Diagnosis not present

## 2024-01-07 DIAGNOSIS — C9210 Chronic myeloid leukemia, BCR/ABL-positive, not having achieved remission: Secondary | ICD-10-CM

## 2024-01-07 NOTE — Progress Notes (Signed)
Erica [name]    [id number]    [birth year]  Primary Care Physician:[name], Erica Pummel, MD Date of Appointment: 01/07/2024 Established Patient Visit  Chief complaint:   Chief Complaint  Patient presents with   Follow-up    SOB with exertion is much improved.     HPI: Erica [name] is a 78 y.o. woman with CML and pleural effusion related to dasatinib  Interval Updates: Here for follow up. She had thoracentesis in the ED in December 2024. Has had this in total x2. Cytology negative for malignancy. Suspect related to dasatinib rather than malignant effusion. She is now getting her care at [facility] and is starting a new chemotherapy agent for her CML. She is off dasatinib.   Breathing is better. She is having a little swelling in her legs.  When she wakes up in the morning her swelling is gone and gets worse as the day goes on.  She has been recommended to wear compression.   I have reviewed the patient's family social and past medical history and updated as appropriate.   Past Medical History:  Diagnosis Date   Bilateral swelling of feet    Cancer (HCC)    Cataract    Diabetes mellitus without complication (HCC)    diet controlled   Eczema    Elevated cholesterol    GERD (gastroesophageal reflux disease)    Glaucoma    Hypertension    Mitral valve prolapse    Simple endometrial hyperplasia without atypia 07/2016   In endometrial polyp with surrounding endometrium inactive recommend follow up ultrasound for endometrial echo 1 year   Spondylisthesis     Past Surgical History:  Procedure Laterality Date   CESAREAN SECTION     DILATATION & CURETTAGE/HYSTEROSCOPY WITH MYOSURE N/A 08/05/2016   Procedure: DILATATION & CURETTAGE/HYSTEROSCOPY WITH MYOSURE;  Surgeon: Dara Lords, MD;  Location: [facility];  Service: Gynecology;  Laterality: N/A;   EXCISION OF SKIN TAG Left 08/05/2016   Procedure: EXCISION OF SKIN TAG;  Surgeon: Dara Lords, MD;  Location:  [facility];  Service: Gynecology;  Laterality: Left;   MOUTH SURGERY     spinal tap     THORACENTESIS N/A 11/02/2023   Procedure: THORACENTESIS;  Surgeon: Karren Burly, MD;  Location[facility] ENDOSCOPY;  Service: Pulmonary;  Laterality: N/A;    Family History  Problem Relation Age of Onset   Heart disease Mother    Heart disease Father    Breast cancer Sister 42    Social History   Occupational History   Not on file  Tobacco Use   Smoking status: Never   Smokeless tobacco: Never  Vaping Use   Vaping status: Never Used  Substance and Sexual Activity   Alcohol use: No    Alcohol/week: 0.0 standard drinks of alcohol   Drug use: No   Sexual activity: Not Currently    Birth control/protection: Post-menopausal    Comment: 1st intercourse 78 yo-Fewer than 5 partners     Physical Exam: Blood pressure 128/68, pulse 76, temperature 98.1 F (36.7 C), temperature source Oral, height 4\' 11"  (1.499 m), weight 176 lb 3.2 oz (79.9 kg), SpO2 98%.  Gen:      No acute distress Lungs:    No increased respiratory effort, symmetric chest wall excursion, breath sounds slightly diminished right lung base, no wheezes or crackles, no increased wob CV:         Regular rate and rhythm; no  murmurs, rubs, or gallops. Trace pedal edema   Data Reviewed: Imaging: I have personally reviewed the chest xray Jan 2025 - trace right sided effusion with associated atelectasis.   PFTs:  Labs: Lab Results  Component Value Date   NA 135 11/20/2023   K 4.0 11/20/2023   CO2 24 11/20/2023   GLUCOSE 143 (H) 11/20/2023   BUN 14 11/20/2023   CREATININE 1.01 (H) 11/20/2023   CALCIUM 8.6 (L) 11/20/2023   GFRNONAA 57 (L) 11/20/2023   Lab Results  Component Value Date   WBC 31.0 (H) 11/20/2023   HGB 11.6 (L) 11/20/2023   HCT 35.1 (L) 11/20/2023   MCV 91.2 11/20/2023   PLT 1,238 (HH) 11/20/2023    Immunization status: Immunization History  Administered Date(s) Administered   PFIZER(Purple  Top)SARS-COV-2 Vaccination 04/14/2020, 05/10/2020    External Records Personally Reviewed: oncology  Assessment:  Recurrent right pleural effusion 2/2 Dasatinib CML  Plan/Recommendations: Your lung exam is reassuring! No signs of fluid building back up.  You can drop off your FMLA paperwork to the front desk so we can fix it.  Follow up with the oncology doctors for the [facility] - I agree with their recommendations to try the new medication.  Call me sooner if breathing gets worse.  Ok to cancel march appt  Return to Care: Return in about 6 months (around 07/06/2024) for ok to cancel march appointment.   Durel Salts, MD Pulmonary and Critical Care Medicine [facility] Office:[phone]

## 2024-01-07 NOTE — Patient Instructions (Addendum)
It was a pleasure to see you today!  Please schedule follow up scheduled with myself in 6 months.  If my schedule is not open yet, we will contact you with a reminder closer to that time. Please call [phone] if you haven't heard from [place] a month before, and always call us sooner if issues or concerns arise. You can also send [place] a message through MyChart, but but aware that this is not to be used for urgent issues and it may take up to 5-7 days to receive a reply. Please be aware that you will likely be able to view your results before I have a chance to respond to them. Please give [place] 5 business days to respond to any non-urgent results.   Your lung exam is reassuring! No signs of fluid building back up.  You can drop off your FMLA paperwork to the front desk so we can fix it.  Follow up with the oncology doctors for the [facility] - I agree with their recommendations to try the new medication.  Call me sooner if breathing gets worse.

## 2024-01-08 ENCOUNTER — Ambulatory Visit: Payer: 59 | Admitting: Adult Health

## 2024-01-27 ENCOUNTER — Ambulatory Visit: Payer: BC Managed Care – PPO | Admitting: Internal Medicine

## 2024-01-27 ENCOUNTER — Ambulatory Visit: Payer: BC Managed Care – PPO | Admitting: Pulmonary Disease

## 2024-01-27 ENCOUNTER — Ambulatory Visit: Payer: Self-pay | Admitting: Internal Medicine

## 2024-03-03 ENCOUNTER — Encounter: Payer: Self-pay | Admitting: *Deleted

## 2024-03-03 ENCOUNTER — Telehealth: Payer: Self-pay | Admitting: Internal Medicine

## 2024-03-03 ENCOUNTER — Other Ambulatory Visit: Payer: Self-pay | Admitting: *Deleted

## 2024-03-03 ENCOUNTER — Telehealth: Payer: Self-pay | Admitting: *Deleted

## 2024-03-03 NOTE — Telephone Encounter (Signed)
 Received paperwork for FMLA for patient from December 2024. Paperwork formerly filled out by Dr. Judeth Horn but patient has decided to switch to my care. Will need to review old FMLA paperwork and speak with patient regarding what clarifications need to be made for part B #8

## 2024-03-03 NOTE — Telephone Encounter (Signed)
 [facility] mobile # x1 on mobile #.  No VM available.  MyChart message sent.

## 2024-03-14 NOTE — Telephone Encounter (Signed)
 Spoke with Dr. Dione Franks regarding the FMLA paperwork and she verified that she completed the form and [facility] faxed the form previously.  She faxed it on 03/09/2024 and faxed to [phone].

## 2024-03-14 NOTE — Telephone Encounter (Signed)
 Faxed FMLA form again to [phone], received confirmation fax that it was sent successfully.  Copy of form sent to scan.  Original left in fax folder.  Nothing further needed.

## 2024-03-14 NOTE — Telephone Encounter (Signed)
 Called today and spoke with patient, she states she works in an [employer] with filling out paperwork.  She stated they are going to stop her pay if they do not receive the [facility] paperwork.  I verified with her that we have the correct contact and fax number to fax the form.

## 2024-03-15 ENCOUNTER — Telehealth: Payer: Self-pay | Admitting: Internal Medicine

## 2024-03-15 NOTE — Telephone Encounter (Signed)
 Found FMLA ppwk in pod for scan. Put on the FMLA spred sheet, copied to file, sent to scan.

## 2024-03-16 NOTE — Telephone Encounter (Signed)
Closing encounter.  Nothing further needed.

## 2024-03-17 NOTE — Telephone Encounter (Signed)
Duplicate encounter.  Closing encounter.  Nothing further needed.

## 2024-04-22 ENCOUNTER — Other Ambulatory Visit (HOSPITAL_COMMUNITY): Payer: Self-pay

## 2024-04-22 MED ORDER — CLINDAMYCIN HCL 300 MG PO CAPS
ORAL_CAPSULE | ORAL | 0 refills | Status: DC
  Filled 2024-04-22: qty 30, 10d supply, fill #0

## 2024-04-26 ENCOUNTER — Other Ambulatory Visit (HOSPITAL_COMMUNITY): Payer: Self-pay

## 2024-04-27 ENCOUNTER — Other Ambulatory Visit (HOSPITAL_COMMUNITY): Payer: Self-pay

## 2024-04-28 ENCOUNTER — Other Ambulatory Visit: Payer: Self-pay

## 2024-04-28 ENCOUNTER — Other Ambulatory Visit (HOSPITAL_COMMUNITY): Payer: Self-pay

## 2024-06-07 ENCOUNTER — Encounter: Payer: Self-pay | Admitting: Internal Medicine

## 2024-06-07 ENCOUNTER — Ambulatory Visit (INDEPENDENT_AMBULATORY_CARE_PROVIDER_SITE_OTHER): Admitting: Internal Medicine

## 2024-06-07 VITALS — BP 118/84 | HR 89 | Ht 59.0 in | Wt 160.4 lb

## 2024-06-07 DIAGNOSIS — J90 Pleural effusion, not elsewhere classified: Secondary | ICD-10-CM | POA: Diagnosis not present

## 2024-06-07 DIAGNOSIS — C9210 Chronic myeloid leukemia, BCR/ABL-positive, not having achieved remission: Secondary | ICD-10-CM

## 2024-06-07 DIAGNOSIS — R0602 Shortness of breath: Secondary | ICD-10-CM | POA: Diagnosis not present

## 2024-06-07 NOTE — Progress Notes (Signed)
 Erica [name]    [id number]    03-Nov-1946  Primary Care Physician:Harris, Elsie SAUNDERS, MD Date of Appointment: 06/07/2024 Established Patient Visit  Chief complaint:   Chief Complaint  Patient presents with   Follow-up     HPI: Erica [name] is a 78 y.o. woman with CML and pleural effusion related to dasatinib   Interval Updates: No recurrence of effusion. On Ponatinib  for her CML. Does not have shortness of breath the way she did in December with the effusion but still has trouble getting around. Took her a long time to walk back from the waiting room. Had to hobble from the chair to the exam table.   Having issues with headaches, rhinorrhea, tinnitus and hearing loss. Due to see neurology.   I have reviewed the patient's family social and past medical history and updated as appropriate.   Past Medical History:  Diagnosis Date   Bilateral swelling of feet    Cancer (HCC)    Cataract    Diabetes mellitus without complication (HCC)    diet controlled   Eczema    Elevated cholesterol    GERD (gastroesophageal reflux disease)    Glaucoma    Hypertension    Mitral valve prolapse    Simple endometrial hyperplasia without atypia 07/2016   In endometrial polyp with surrounding endometrium inactive recommend follow up ultrasound for endometrial echo 1 year   Spondylisthesis     Past Surgical History:  Procedure Laterality Date   CESAREAN SECTION     DILATATION & CURETTAGE/HYSTEROSCOPY WITH MYOSURE N/A 08/05/2016   Procedure: DILATATION & CURETTAGE/HYSTEROSCOPY WITH MYOSURE;  Surgeon: Evalene SHAUNNA Organ, MD;  Location: [facility];  Service: Gynecology;  Laterality: N/A;   EXCISION OF SKIN TAG Left 08/05/2016   Procedure: EXCISION OF SKIN TAG;  Surgeon: Evalene SHAUNNA Organ, MD;  Location: [facility];  Service: Gynecology;  Laterality: Left;   MOUTH SURGERY     spinal tap     THORACENTESIS N/A 11/02/2023   Procedure: THORACENTESIS;  Surgeon: Annella Donnice SAUNDERS, MD;   Location: [facility] ENDOSCOPY;  Service: Pulmonary;  Laterality: N/A;    Family History  Problem Relation Age of Onset   Heart disease Mother    Heart disease Father    Breast cancer Sister 24    Social History   Occupational History   Not on file  Tobacco Use   Smoking status: Never   Smokeless tobacco: Never  Vaping Use   Vaping status: Never Used  Substance and Sexual Activity   Alcohol use: No    Alcohol/week: 0.0 standard drinks of alcohol   Drug use: No   Sexual activity: Not Currently    Birth control/protection: Post-menopausal    Comment: 1st intercourse 78 yo-Fewer than 5 partners     Physical Exam: Blood pressure 118/84, pulse 89, height 4' 11 (1.499 m), weight 160 lb 6.4 oz (72.8 kg), SpO2 99%.  Gen:      No acute distress Lungs:    No increased respiratory effort, symmetric chest wall excursion, breath sounds slightly diminished right lung base, no wheezes or crackles, no increased wob CV:         Regular rate and rhythm; no murmurs, rubs, or gallops. Trace pedal edema   Data Reviewed: Imaging: I have personally reviewed the chest xray Jan 2025 - trace right sided effusion with associated atelectasis.   PFTs:  Labs: Lab Results  Component Value Date   NA 135 11/20/2023  K 4.0 11/20/2023   CO2 24 11/20/2023   GLUCOSE 143 (H) 11/20/2023   BUN 14 11/20/2023   CREATININE 1.01 (H) 11/20/2023   CALCIUM 8.6 (L) 11/20/2023   GFRNONAA 57 (L) 11/20/2023   Lab Results  Component Value Date   WBC 31.0 (H) 11/20/2023   HGB 11.6 (L) 11/20/2023   HCT 35.1 (L) 11/20/2023   MCV 91.2 11/20/2023   PLT 1,238 (HH) 11/20/2023    Immunization status: Immunization History  Administered Date(s) Administered   PFIZER(Purple Top)SARS-COV-2 Vaccination 04/14/2020, 05/10/2020    External Records Personally Reviewed: oncology  Assessment:  Recurrent right pleural effusion 2/2 Dasatinib , resolved CML on treatment Shortness of breath  Plan/Recommendations:  I am  glad your breathing is doing okay.  I do not see any signs of that fluid coming back on your lungs.  Please come back and see me again if you have symptoms of shortness of breath that feel like the fluid is coming back on.  Regarding your headaches I recommend you keep a headache diary and keep track of every time you have a headache, how long it lasts, any associated symptoms such as nausea, ringing in your ears, eye pain.  If you give all this information to the doctor when you see them about your headaches they will be able to have a more complete picture.  Handicap placard form filled out.   I spent 30 minutes in the care of this patient today including pre-charting, chart review, review of results, face-to-face care, coordination of care and communication with consultants etc.).   Return to Care: Return if symptoms worsen or fail to improve.   Verdon Gore, MD Pulmonary and Critical Care Medicine [facility] Office:[phone]

## 2024-06-07 NOTE — Patient Instructions (Addendum)
 It was a pleasure to see you today!  Please schedule follow up as needed. .  If my schedule is not open yet, we will contact you with a reminder closer to that time. Please call [phone] if you haven't heard from us  a month before, and always call us  sooner if issues or concerns arise. You can also send us  a message through MyChart, but but aware that this is not to be used for urgent issues and it may take up to 5-7 days to receive a reply. Please be aware that you will likely be able to view your results before I have a chance to respond to them. Please give us  5 business days to respond to any non-urgent results.    I am glad your breathing is doing okay.  I do not see any signs of that fluid coming back on your lungs.  Please come back and see me again if you have symptoms of shortness of breath that feel like the fluid is coming back on.  Regarding your headaches I recommend you keep a headache diary and keep track of every time you have a headache, how long it lasts, any associated symptoms such as nausea, ringing in your ears, eye pain.  If you give all this information to the doctor when you see them about your headaches they will be able to have a more complete picture.

## 2024-08-02 ENCOUNTER — Other Ambulatory Visit: Payer: Self-pay | Admitting: Family Medicine

## 2024-08-02 ENCOUNTER — Ambulatory Visit
Admission: RE | Admit: 2024-08-02 | Discharge: 2024-08-02 | Disposition: A | Discharge status: Home / Self Care | Source: Ambulatory Visit | Attending: Family Medicine | Admitting: Family Medicine

## 2024-08-02 DIAGNOSIS — R1032 Left lower quadrant pain: Secondary | ICD-10-CM
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
## 2024-09-09 ENCOUNTER — Ambulatory Visit: Admitting: Diagnostic Neuroimaging

## 2024-09-09 ENCOUNTER — Telehealth: Payer: Self-pay | Admitting: Diagnostic Neuroimaging

## 2024-09-09 NOTE — Telephone Encounter (Signed)
 Pt has cx appointment for today because she is sick.  It was explained that she can call on Monday, speak with billing and then request to r/s once No Show fee has been addressed

## 2024-09-13 ENCOUNTER — Other Ambulatory Visit: Payer: Self-pay | Admitting: Physician Assistant

## 2024-09-13 ENCOUNTER — Ambulatory Visit
Admission: RE | Admit: 2024-09-13 | Discharge: 2024-09-13 | Disposition: A | Discharge status: Home / Self Care | Source: Ambulatory Visit | Attending: Physician Assistant | Admitting: Physician Assistant

## 2024-09-13 DIAGNOSIS — W19XXXA Unspecified fall, initial encounter: Secondary | ICD-10-CM

## 2024-09-15 ENCOUNTER — Emergency Department (HOSPITAL_BASED_OUTPATIENT_CLINIC_OR_DEPARTMENT_OTHER)

## 2024-09-15 ENCOUNTER — Encounter (HOSPITAL_BASED_OUTPATIENT_CLINIC_OR_DEPARTMENT_OTHER): Payer: Self-pay | Admitting: Emergency Medicine

## 2024-09-15 ENCOUNTER — Inpatient Hospital Stay (HOSPITAL_BASED_OUTPATIENT_CLINIC_OR_DEPARTMENT_OTHER)
Admission: EM | Admit: 2024-09-15 | Discharge: 2024-09-22 | DRG: 871 | Disposition: A | Discharge status: Home Health | Attending: Internal Medicine | Admitting: Internal Medicine

## 2024-09-15 ENCOUNTER — Other Ambulatory Visit: Payer: Self-pay

## 2024-09-15 DIAGNOSIS — E1140 Type 2 diabetes mellitus with diabetic neuropathy, unspecified: Secondary | ICD-10-CM | POA: Diagnosis present

## 2024-09-15 DIAGNOSIS — E119 Type 2 diabetes mellitus without complications: Secondary | ICD-10-CM

## 2024-09-15 DIAGNOSIS — Z803 Family history of malignant neoplasm of breast: Secondary | ICD-10-CM

## 2024-09-15 DIAGNOSIS — Z8249 Family history of ischemic heart disease and other diseases of the circulatory system: Secondary | ICD-10-CM

## 2024-09-15 DIAGNOSIS — I5032 Chronic diastolic (congestive) heart failure: Secondary | ICD-10-CM | POA: Diagnosis present

## 2024-09-15 DIAGNOSIS — C9210 Chronic myeloid leukemia, BCR/ABL-positive, not having achieved remission: Secondary | ICD-10-CM | POA: Diagnosis present

## 2024-09-15 DIAGNOSIS — I700 Atherosclerosis of aorta: Secondary | ICD-10-CM | POA: Diagnosis present

## 2024-09-15 DIAGNOSIS — Z5111 Encounter for antineoplastic chemotherapy: Secondary | ICD-10-CM

## 2024-09-15 DIAGNOSIS — R54 Age-related physical debility: Secondary | ICD-10-CM | POA: Diagnosis present

## 2024-09-15 DIAGNOSIS — I341 Nonrheumatic mitral (valve) prolapse: Secondary | ICD-10-CM | POA: Diagnosis present

## 2024-09-15 DIAGNOSIS — R4182 Altered mental status, unspecified: Secondary | ICD-10-CM

## 2024-09-15 DIAGNOSIS — D849 Immunodeficiency, unspecified: Secondary | ICD-10-CM | POA: Diagnosis present

## 2024-09-15 DIAGNOSIS — R6510 Systemic inflammatory response syndrome (SIRS) of non-infectious origin without acute organ dysfunction: Principal | ICD-10-CM

## 2024-09-15 DIAGNOSIS — I110 Hypertensive heart disease with heart failure: Secondary | ICD-10-CM | POA: Diagnosis present

## 2024-09-15 DIAGNOSIS — Z79899 Other long term (current) drug therapy: Secondary | ICD-10-CM

## 2024-09-15 DIAGNOSIS — K2100 Gastro-esophageal reflux disease with esophagitis, without bleeding: Secondary | ICD-10-CM | POA: Diagnosis present

## 2024-09-15 DIAGNOSIS — Z7982 Long term (current) use of aspirin: Secondary | ICD-10-CM

## 2024-09-15 DIAGNOSIS — W19XXXA Unspecified fall, initial encounter: Secondary | ICD-10-CM | POA: Diagnosis present

## 2024-09-15 DIAGNOSIS — G629 Polyneuropathy, unspecified: Secondary | ICD-10-CM

## 2024-09-15 DIAGNOSIS — Z1152 Encounter for screening for COVID-19: Secondary | ICD-10-CM

## 2024-09-15 DIAGNOSIS — J9811 Atelectasis: Secondary | ICD-10-CM | POA: Diagnosis present

## 2024-09-15 DIAGNOSIS — J189 Pneumonia, unspecified organism: Secondary | ICD-10-CM | POA: Diagnosis present

## 2024-09-15 DIAGNOSIS — K5909 Other constipation: Secondary | ICD-10-CM | POA: Diagnosis present

## 2024-09-15 DIAGNOSIS — Z7984 Long term (current) use of oral hypoglycemic drugs: Secondary | ICD-10-CM

## 2024-09-15 DIAGNOSIS — R296 Repeated falls: Secondary | ICD-10-CM | POA: Diagnosis present

## 2024-09-15 DIAGNOSIS — I10 Essential (primary) hypertension: Secondary | ICD-10-CM | POA: Insufficient documentation

## 2024-09-15 DIAGNOSIS — R509 Fever, unspecified: Secondary | ICD-10-CM | POA: Insufficient documentation

## 2024-09-15 DIAGNOSIS — D630 Anemia in neoplastic disease: Secondary | ICD-10-CM | POA: Diagnosis present

## 2024-09-15 DIAGNOSIS — K209 Esophagitis, unspecified without bleeding: Secondary | ICD-10-CM

## 2024-09-15 DIAGNOSIS — D649 Anemia, unspecified: Secondary | ICD-10-CM | POA: Diagnosis present

## 2024-09-15 DIAGNOSIS — A419 Sepsis, unspecified organism: Secondary | ICD-10-CM | POA: Diagnosis not present

## 2024-09-15 DIAGNOSIS — E7800 Pure hypercholesterolemia, unspecified: Secondary | ICD-10-CM | POA: Diagnosis present

## 2024-09-15 DIAGNOSIS — E872 Acidosis, unspecified: Secondary | ICD-10-CM | POA: Diagnosis present

## 2024-09-15 DIAGNOSIS — Z7969 Long term (current) use of other immunomodulators and immunosuppressants: Secondary | ICD-10-CM

## 2024-09-15 DIAGNOSIS — F419 Anxiety disorder, unspecified: Secondary | ICD-10-CM | POA: Diagnosis present

## 2024-09-15 DIAGNOSIS — R6520 Severe sepsis without septic shock: Secondary | ICD-10-CM | POA: Diagnosis present

## 2024-09-15 DIAGNOSIS — G9341 Metabolic encephalopathy: Secondary | ICD-10-CM | POA: Diagnosis present

## 2024-09-15 DIAGNOSIS — Z880 Allergy status to penicillin: Secondary | ICD-10-CM

## 2024-09-15 DIAGNOSIS — Z882 Allergy status to sulfonamides status: Secondary | ICD-10-CM

## 2024-09-15 LAB — RETICULOCYTES
Immature Retic Fract: 5.6 % (ref 2.3–15.9)
RBC.: 2.52 MIL/uL — ABNORMAL LOW (ref 3.87–5.11)
Retic Count, Absolute: 6.4 K/uL — ABNORMAL LOW (ref 19.0–186.0)
Retic Ct Pct: <0.4 % — ABNORMAL LOW (ref 0.4–3.1)

## 2024-09-15 LAB — URINALYSIS, ROUTINE W REFLEX MICROSCOPIC
Bilirubin Urine: NEGATIVE
Glucose, UA: NEGATIVE mg/dL
Hgb urine dipstick: NEGATIVE
Ketones, ur: NEGATIVE mg/dL
Leukocytes,Ua: NEGATIVE
Nitrite: NEGATIVE
Protein, ur: 30 mg/dL — AB
Specific Gravity, Urine: 1.02 (ref 1.005–1.030)
pH: 8 (ref 5.0–8.0)

## 2024-09-15 LAB — CBG MONITORING, ED: Glucose-Capillary: 133 mg/dL — ABNORMAL HIGH (ref 70–99)

## 2024-09-15 LAB — COMPREHENSIVE METABOLIC PANEL WITH GFR
ALT: 5 U/L (ref 0–44)
AST: 30 U/L (ref 15–41)
Albumin: 3.6 g/dL (ref 3.5–5.0)
Alkaline Phosphatase: 84 U/L (ref 38–126)
Anion gap: 10 (ref 5–15)
BUN: 6 mg/dL — ABNORMAL LOW (ref 8–23)
CO2: 25 mmol/L (ref 22–32)
Calcium: 9.5 mg/dL (ref 8.9–10.3)
Chloride: 97 mmol/L — ABNORMAL LOW (ref 98–111)
Creatinine, Ser: 0.67 mg/dL (ref 0.44–1.00)
GFR, Estimated: >60 mL/min (ref >60)
Glucose, Bld: 157 mg/dL — ABNORMAL HIGH (ref 70–99)
Potassium: 4.6 mmol/L (ref 3.5–5.1)
Sodium: 132 mmol/L — ABNORMAL LOW (ref 135–145)
Total Bilirubin: 0.5 mg/dL (ref 0.0–1.2)
Total Protein: 6.5 g/dL (ref 6.5–8.1)

## 2024-09-15 LAB — FERRITIN: Ferritin: 1294 ng/mL — ABNORMAL HIGH (ref 11–307)

## 2024-09-15 LAB — CBC
HCT: 21 % — ABNORMAL LOW (ref 36.0–46.0)
HCT: 18.6 % — ABNORMAL LOW (ref 36.0–46.0)
Hemoglobin: 7 g/dL — ABNORMAL LOW (ref 12.0–15.0)
Hemoglobin: 6.3 g/dL — CL (ref 12.0–15.0)
MCH: 27.1 pg (ref 26.0–34.0)
MCH: 27.6 pg (ref 26.0–34.0)
MCHC: 33.3 g/dL (ref 30.0–36.0)
MCHC: 33.9 g/dL (ref 30.0–36.0)
MCV: 81.4 fL (ref 80.0–100.0)
MCV: 81.6 fL (ref 80.0–100.0)
Platelets: 188 K/uL (ref 150–400)
Platelets: 277 K/uL (ref 150–400)
RBC: 2.58 MIL/uL — ABNORMAL LOW (ref 3.87–5.11)
RBC: 2.28 MIL/uL — ABNORMAL LOW (ref 3.87–5.11)
RDW: 14.6 % (ref 11.5–15.5)
RDW: 14.7 % (ref 11.5–15.5)
WBC: 7.5 K/uL (ref 4.0–10.5)
WBC: 7.5 K/uL (ref 4.0–10.5)
nRBC: 0 % (ref 0.0–0.2)
nRBC: 0 % (ref 0.0–0.2)

## 2024-09-15 LAB — LACTIC ACID, PLASMA
Lactic Acid, Venous: 2.2 mmol/L (ref 0.5–1.9)
Lactic Acid, Venous: 2.1 mmol/L (ref 0.5–1.9)

## 2024-09-15 LAB — IRON AND TIBC
Iron: 46 ug/dL (ref 28–170)
Saturation Ratios: 19 % (ref 10.4–31.8)
TIBC: 239 ug/dL — ABNORMAL LOW (ref 250–450)
UIBC: 193 ug/dL

## 2024-09-15 LAB — VITAMIN B12: Vitamin B-12: 912 pg/mL (ref 180–914)

## 2024-09-15 LAB — OCCULT BLOOD X 1 CARD TO LAB, STOOL: Fecal Occult Bld: NEGATIVE

## 2024-09-15 LAB — RESP PANEL BY RT-PCR (RSV, FLU A&B, COVID)  RVPGX2
Influenza A by PCR: NEGATIVE
Influenza B by PCR: NEGATIVE
Resp Syncytial Virus by PCR: NEGATIVE
SARS Coronavirus 2 by RT PCR: NEGATIVE

## 2024-09-15 LAB — FOLATE: Folate: 7.7 ng/mL (ref >5.9)

## 2024-09-15 LAB — PROTIME-INR
INR: 1.1 (ref 0.8–1.2)
Prothrombin Time: 15 s (ref 11.4–15.2)

## 2024-09-15 MED ORDER — SODIUM CHLORIDE 0.9 % IV BOLUS
1000.0000 mL | Freq: Once | INTRAVENOUS | Status: AC
  Administered 2024-09-15: 1000 mL via INTRAVENOUS

## 2024-09-15 MED ORDER — VANCOMYCIN HCL IN DEXTROSE 1-5 GM/200ML-% IV SOLN
1000.0000 mg | Freq: Once | INTRAVENOUS | Status: AC
  Administered 2024-09-15: 1000 mg via INTRAVENOUS
  Filled 2024-09-15: qty 200

## 2024-09-15 MED ORDER — ACETAMINOPHEN 325 MG PO TABS
650.0000 mg | ORAL_TABLET | Freq: Once | ORAL | Status: DC
  Filled 2024-09-15: qty 2

## 2024-09-15 MED ORDER — IOHEXOL 350 MG/ML SOLN
100.0000 mL | Freq: Once | INTRAVENOUS | Status: AC | PRN
  Administered 2024-09-15: 100 mL via INTRAVENOUS

## 2024-09-15 MED ORDER — IBUPROFEN 400 MG PO TABS: 600.0000 mg | ORAL_TABLET | Freq: Once | ORAL | Status: DC

## 2024-09-15 MED ORDER — ACETAMINOPHEN 500 MG PO TABS
1000.0000 mg | ORAL_TABLET | Freq: Once | ORAL | Status: AC
Start: 2024-09-15 — End: 2024-09-15
  Administered 2024-09-15: 1000 mg via ORAL
  Filled 2024-09-15: qty 2

## 2024-09-15 MED ORDER — VANCOMYCIN HCL 500 MG IV SOLR
500.0000 mg | Freq: Once | INTRAVENOUS | Status: AC
  Administered 2024-09-15: 500 mg via INTRAVENOUS

## 2024-09-15 MED ORDER — SODIUM CHLORIDE 0.9 % IV SOLN
2.0000 g | Freq: Once | INTRAVENOUS | Status: AC
  Administered 2024-09-15: 2 g via INTRAVENOUS
  Filled 2024-09-15: qty 12.5

## 2024-09-15 NOTE — Progress Notes (Signed)
 ED Pharmacy Antibiotic Sign Off An antibiotic consult was received from an ED provider for vancomycin and cefepime per pharmacy dosing for sepsis. A chart review was completed to assess appropriateness.   The following one time order(s) were placed:  Vancomycin 1500mg  IV x  1 Cefepime 2g IV x 1  Further antibiotic and/or antibiotic pharmacy consults should be ordered by the admitting provider if indicated.   Thank you for allowing pharmacy to be a part of this patient's care.   Alix Lahmann, Vernell Helling, [facility]  Clinical Pharmacist 09/15/24 3:33 PM

## 2024-09-15 NOTE — Plan of Care (Signed)
 Plan of Care Note for accepted transfer   Patient name: Erica [name] FMW:[id number] DOB: [birth year]  Facility requesting transfer: [facility] Requesting Provider: Dr. Ruthe Reason for transfer: Symptomatic anemia, sepsis Facility course: 78 year old female with history of CML on chemotherapy, type 2 diabetes, hyperlipidemia, hypertension, glaucoma, GERD, mitral valve prolapse, and other medical comorbidities presented to the ED for evaluation of dizziness/lightheadedness, fatigue, generalized weakness, poor p.o. intake, and fall.  Febrile with temperature as high as 102.2 F, heart rate in the 80-90s, not hypotensive.  Labs showing no leukocytosis, hemoglobin 6.3 (previously 11.6 in December 2024), FOBT negative, sodium 132, COVID/influenza/RSV PCR negative, lactic acid 2.2.  Chest x-ray showing no acute findings.  CT head/C-spine negative for acute findings.  UA pending to rule out UTI.  Patient was given vancomycin, cefepime, and 1 L normal saline.  Needs blood transfusion for symptomatic anemia.  Plan of care: The patient is accepted for admission to Progressive unit at [facility].  [facility] will assume care on arrival to accepting facility. Until arrival, care as per EDP. However, [facility] available 24/7 for questions and assistance.  Check [email or web address] for on-call coverage.  Nursing staff, please call [facility] Admits & Consults System-Wide number under Amion on patient's arrival so appropriate admitting provider can evaluate the pt.

## 2024-09-15 NOTE — ED Notes (Signed)
Pt used bedpan 

## 2024-09-15 NOTE — ED Provider Notes (Addendum)
 [place] EMERGENCY DEPARTMENT AT [facility] Provider Note   CSN: [id number] Arrival date & time: 09/15/24  1401     Patient presents with: Dizziness   Erica [name] is a 78 y.o. female.   Patient here with ongoing dizziness fatigue weakness that is been ongoing for the last week or so.  She has had generalized fatigue not able to really do much.  Not eating not drinking.  She has a history of diabetes reflux hypertension cholesterol.  She has a history of chronic leukemia.  She is on oral chemotherapy for it.  She denies any black or bloody stools.  She denies any fevers or chills.  She denies any cough sputum production.  She denies any pain with urination.  The history is provided by the patient.       Prior to Admission medications   Medication Sig Start Date End Date Taking? Authorizing Provider  allopurinol  (ZYLOPRIM ) 300 MG tablet Take 1 tablet by mouth daily. 12/29/23   [provider]  aspirin  EC 81 MG tablet Take 81 mg by mouth daily. Swallow whole.    [provider]  furosemide  (LASIX ) 20 MG tablet Take 20 mg by mouth daily. 09/06/20   [provider]  gabapentin  (NEURONTIN ) 100 MG capsule Take 100 mg by mouth 3 (three) times daily. 10/23/20   [provider]  latanoprost  (XALATAN ) 0.005 % ophthalmic solution Place 1 drop into both eyes at bedtime.     [provider]  loratadine (CLARITIN) 10 MG tablet Take 10 mg by mouth daily as needed for allergies.    [provider]  PONATinib  HCl (ICLUSIG ) 45 MG tablet Take by mouth. 12/29/23 12/28/24  [provider]  propranolol  (INDERAL ) 10 MG tablet Take 10 mg by mouth 3 (three) times daily.    [provider]  traMADol  (ULTRAM ) 50 MG tablet Take 50 mg by mouth every 6 (six) hours as needed for moderate pain.     [provider]  triamcinolone cream (KENALOG) 0.1 % Apply 1 application topically 2 (two) times daily. Reported on 06/06/2016     [provider]    Allergies: Penicillins and Sulfa antibiotics    Review of Systems  Updated Vital Signs BP (!) 151/59   Pulse 95   Temp (!) 102.2 F (39 C) (Oral)   Resp (!) 23   Ht 4' 11 (1.499 m)   Wt 72.8 kg   SpO2 97%   BMI 32.42 kg/m   Physical Exam Vitals and nursing note reviewed.  Constitutional:      General: She is not in acute distress.    Appearance: She is well-developed. She is not ill-appearing.  HENT:     Head: Normocephalic and atraumatic.     Nose: Nose normal.     Mouth/Throat:     Mouth: Mucous membranes are moist.  Eyes:     Extraocular Movements: Extraocular movements intact.     Conjunctiva/sclera: Conjunctivae normal.     Pupils: Pupils are equal, round, and reactive to light.  Cardiovascular:     Rate and Rhythm: Normal rate and regular rhythm.     Pulses: Normal pulses.     Heart sounds: Normal heart sounds. No murmur heard. Pulmonary:     Effort: Pulmonary effort is normal. No respiratory distress.     Breath sounds: Normal breath sounds.  Abdominal:     General: Abdomen is flat.     Palpations: Abdomen is soft.  Tenderness: There is no abdominal tenderness.  Musculoskeletal:        General: No swelling.     Cervical back: Normal range of motion and neck supple.  Skin:    General: Skin is warm and dry.     Capillary Refill: Capillary refill takes less than 2 seconds.  Neurological:     General: No focal deficit present.     Mental Status: She is alert and oriented to person, place, and time.     Cranial Nerves: No cranial nerve deficit.     Sensory: No sensory deficit.     Motor: No weakness.     Coordination: Coordination normal.  Psychiatric:        Mood and Affect: Mood normal.     (all labs ordered are listed, but only abnormal results are displayed) Labs Reviewed  COMPREHENSIVE METABOLIC PANEL WITH GFR - Abnormal; Notable for the following components:      Result Value   Sodium 132 (*)    Chloride 97 (*)     Glucose, Bld 157 (*)    BUN 6 (*)    All other components within normal limits  CBC - Abnormal; Notable for the following components:   RBC 2.58 (*)    Hemoglobin 7.0 (*)    HCT 21.0 (*)    All other components within normal limits  URINALYSIS, ROUTINE W REFLEX MICROSCOPIC - Abnormal; Notable for the following components:   Protein, ur 30 (*)    Bacteria, UA RARE (*)    All other components within normal limits  FERRITIN - Abnormal; Notable for the following components:   Ferritin 1,294 (*)    All other components within normal limits  RETICULOCYTES - Abnormal; Notable for the following components:   Retic Ct Pct <0.4 (*)    RBC. 2.52 (*)    Retic Count, Absolute 6.4 (*)    All other components within normal limits  LACTIC ACID, PLASMA - Abnormal; Notable for the following components:   Lactic Acid, Venous 2.2 (*)    All other components within normal limits  LACTIC ACID, PLASMA - Abnormal; Notable for the following components:   Lactic Acid, Venous 2.1 (*)    All other components within normal limits  CBC - Abnormal; Notable for the following components:   RBC 2.28 (*)    Hemoglobin 6.3 (*)    HCT 18.6 (*)    All other components within normal limits  CBG MONITORING, ED - Abnormal; Notable for the following components:   Glucose-Capillary 133 (*)    All other components within normal limits  RESP PANEL BY RT-PCR (RSV, FLU A&B, COVID)  RVPGX2  CULTURE, BLOOD (ROUTINE X 2)  CULTURE, BLOOD (ROUTINE X 2)  OCCULT BLOOD X 1 CARD TO LAB, STOOL  PROTIME-INR  VITAMIN B12  FOLATE  IRON AND TIBC  CBG MONITORING, ED    EKG: EKG Interpretation Date/Time:  Thursday September 15 2024 14:13:18 EDT Ventricular Rate:  97 PR Interval:  124 QRS Duration:  60 QT Interval:  298 QTC Calculation: 378 R Axis:   28  Text Interpretation: Normal sinus rhythm Normal ECG When compared with ECG of 19-Nov-2023 13:45, QT has shortened Confirmed by Ruthe Cornet [phone]) on 09/15/2024 3:24:31  PM  Radiology: CT Angio Chest PE W and/or Wo Contrast Result Date: 09/15/2024 CLINICAL DATA:  Dizziness and fatigue. Concern for pulmonary embolism. Abdominal pain. EXAM: CT ANGIOGRAPHY CHEST CT ABDOMEN AND PELVIS WITH CONTRAST TECHNIQUE: Multidetector CT imaging of the chest  was performed using the standard protocol during bolus administration of intravenous contrast. Multiplanar CT image reconstructions and MIPs were obtained to evaluate the vascular anatomy. Multidetector CT imaging of the abdomen and pelvis was performed using the standard protocol during bolus administration of intravenous contrast. RADIATION DOSE REDUCTION: This exam was performed according to the departmental dose-optimization program which includes automated exposure control, adjustment of the mA and/or kV according to patient size and/or use of iterative reconstruction technique. CONTRAST:  OMNIPAQUE IOHEXOL 350 MG/ML SOLN COMPARISON:  CT abdomen pelvis dated 08/02/2024 FINDINGS: Evaluation of this exam is limited due to respiratory motion and streak artifact caused by patient's arms. CTA CHEST FINDINGS Cardiovascular: There is no cardiomegaly or pericardial effusion. The thoracic aorta is unremarkable. The origins of the great vessels of the aortic arch are patent. Evaluation of the pulmonary arteries is limited due to respiratory motion. No central pulmonary artery embolus identified. Mediastinum/Nodes: No hilar or mediastinal adenopathy. Mild thickened appearance of the esophagus may be related to underdistention. Esophagitis or underlying esophageal mass is not excluded. Clinical correlation is recommended. Lungs/Pleura: Small right pleural effusion. Subsegmental right lung base atelectasis. Pneumonia is not excluded. There is no pneumothorax. The central airways are patent. Musculoskeletal: Degenerative changes of the spine. No acute osseous pathology. Review of the MIP images confirms the above findings. CT ABDOMEN and  PELVIS FINDINGS No intra-abdominal free air or free fluid. Hepatobiliary: Similar appearance of faint hypodense lesions in the left lobe of the liver not characterized on this CT. This can be better evaluated with ultrasound or MRI on a nonemergent/outpatient basis. No biliary dilatation. No calcified gallstone. Pancreas: The pancreas is suboptimally evaluated due to respiratory motion but grossly unremarkable. Spleen: Normal in size without focal abnormality. Adrenals/Urinary Tract: The adrenal glands unremarkable. The kidneys, and urinary bladder unremarkable. Stomach/Bowel: There is no bowel obstruction or active inflammation. The appendix is normal. Vascular/Lymphatic: Mild aortoiliac atherosclerotic disease. The IVC is unremarkable. No portal venous gas. Indeterminate 1.8 x 2.9 cm infiltrative tissue in the portacaval region (20/4) of indeterminate etiology but suspicious for adenopathy or retroperitoneal fibrosis or a neoplastic process. This can be better evaluated with MRI without and with contrast on a nonemergent basis. Reproductive: The uterus is grossly unremarkable. No suspicious adnexal masses. Other: None Musculoskeletal: Degenerative changes of the spine and hips. No acute osseous pathology. IMPRESSION: 1. No CT evidence of central pulmonary artery embolus. 2. Small right pleural effusion with subsegmental right lung base atelectasis. Pneumonia is not excluded. 3. Mild thickened appearance of the esophagus may be related to underdistention. Esophagitis or underlying esophageal mass is not excluded. Endoscopy may provide better evaluation. 4. No bowel obstruction. Normal appendix. 5. Indeterminate 1.8 x 2.9 cm infiltrative tissue in the portacaval region of indeterminate etiology but suspicious for adenopathy or retroperitoneal fibrosis or a neoplastic process. This can be better evaluated with MRI without and with contrast on a nonemergent basis. 6.  Aortic Atherosclerosis (ICD10-I70.0).  Electronically Signed   By: Vanetta Chou M.D.   On: 09/15/2024 20:54   CT ABDOMEN PELVIS W CONTRAST Result Date: 09/15/2024 CLINICAL DATA:  Dizziness and fatigue. Concern for pulmonary embolism. Abdominal pain. EXAM: CT ANGIOGRAPHY CHEST CT ABDOMEN AND PELVIS WITH CONTRAST TECHNIQUE: Multidetector CT imaging of the chest was performed using the standard protocol during bolus administration of intravenous contrast. Multiplanar CT image reconstructions and MIPs were obtained to evaluate the vascular anatomy. Multidetector CT imaging of the abdomen and pelvis was performed using the standard protocol during bolus administration  of intravenous contrast. RADIATION DOSE REDUCTION: This exam was performed according to the departmental dose-optimization program which includes automated exposure control, adjustment of the mA and/or kV according to patient size and/or use of iterative reconstruction technique. CONTRAST:  OMNIPAQUE IOHEXOL 350 MG/ML SOLN COMPARISON:  CT abdomen pelvis dated 08/02/2024 FINDINGS: Evaluation of this exam is limited due to respiratory motion and streak artifact caused by patient's arms. CTA CHEST FINDINGS Cardiovascular: There is no cardiomegaly or pericardial effusion. The thoracic aorta is unremarkable. The origins of the great vessels of the aortic arch are patent. Evaluation of the pulmonary arteries is limited due to respiratory motion. No central pulmonary artery embolus identified. Mediastinum/Nodes: No hilar or mediastinal adenopathy. Mild thickened appearance of the esophagus may be related to underdistention. Esophagitis or underlying esophageal mass is not excluded. Clinical correlation is recommended. Lungs/Pleura: Small right pleural effusion. Subsegmental right lung base atelectasis. Pneumonia is not excluded. There is no pneumothorax. The central airways are patent. Musculoskeletal: Degenerative changes of the spine. No acute osseous pathology. Review of the MIP images  confirms the above findings. CT ABDOMEN and PELVIS FINDINGS No intra-abdominal free air or free fluid. Hepatobiliary: Similar appearance of faint hypodense lesions in the left lobe of the liver not characterized on this CT. This can be better evaluated with ultrasound or MRI on a nonemergent/outpatient basis. No biliary dilatation. No calcified gallstone. Pancreas: The pancreas is suboptimally evaluated due to respiratory motion but grossly unremarkable. Spleen: Normal in size without focal abnormality. Adrenals/Urinary Tract: The adrenal glands unremarkable. The kidneys, and urinary bladder unremarkable. Stomach/Bowel: There is no bowel obstruction or active inflammation. The appendix is normal. Vascular/Lymphatic: Mild aortoiliac atherosclerotic disease. The IVC is unremarkable. No portal venous gas. Indeterminate 1.8 x 2.9 cm infiltrative tissue in the portacaval region (20/4) of indeterminate etiology but suspicious for adenopathy or retroperitoneal fibrosis or a neoplastic process. This can be better evaluated with MRI without and with contrast on a nonemergent basis. Reproductive: The uterus is grossly unremarkable. No suspicious adnexal masses. Other: None Musculoskeletal: Degenerative changes of the spine and hips. No acute osseous pathology. IMPRESSION: 1. No CT evidence of central pulmonary artery embolus. 2. Small right pleural effusion with subsegmental right lung base atelectasis. Pneumonia is not excluded. 3. Mild thickened appearance of the esophagus may be related to underdistention. Esophagitis or underlying esophageal mass is not excluded. Endoscopy may provide better evaluation. 4. No bowel obstruction. Normal appendix. 5. Indeterminate 1.8 x 2.9 cm infiltrative tissue in the portacaval region of indeterminate etiology but suspicious for adenopathy or retroperitoneal fibrosis or a neoplastic process. This can be better evaluated with MRI without and with contrast on a nonemergent basis. 6.  Aortic  Atherosclerosis (ICD10-I70.0). Electronically Signed   By: Vanetta Chou M.D.   On: 09/15/2024 20:54   CT Head Wo Contrast Result Date: 09/15/2024 CLINICAL DATA:  Poly trauma fall dizzy EXAM: CT HEAD WITHOUT CONTRAST CT CERVICAL SPINE WITHOUT CONTRAST TECHNIQUE: Multidetector CT imaging of the head and cervical spine was performed following the standard protocol without intravenous contrast. Multiplanar CT image reconstructions of the cervical spine were also generated. RADIATION DOSE REDUCTION: This exam was performed according to the departmental dose-optimization program which includes automated exposure control, adjustment of the mA and/or kV according to patient size and/or use of iterative reconstruction technique. COMPARISON:  Radiograph 08/18/2018, CT brain 09/13/2024 FINDINGS: CT HEAD FINDINGS Brain: No acute territorial infarction, hemorrhage or intracranial mass. Mild atrophy. Stable ventricle size. Partially empty sella Vascular: No hyperdense vessels.  Carotid vascular calcification. Skull: Normal. Negative for fracture or focal lesion. Sinuses/Orbits: No acute finding. Other: None CT CERVICAL SPINE FINDINGS Alignment: Straightening of the cervical spine. No subluxation. Facet alignment is normal Skull base and vertebrae: No acute fracture. No primary bone lesion or focal pathologic process. Soft tissues and spinal canal: No prevertebral fluid or swelling. No visible canal hematoma. Disc levels: Moderate severe diffuse disc space narrowing C3 through T1. No high-grade canal stenosis Upper chest: Negative. Other: None IMPRESSION: 1. No CT evidence for acute intracranial abnormality. Mild atrophy. 2. Straightening of the cervical spine with degenerative changes. No acute osseous abnormality. Electronically Signed   By: Luke Bun M.D.   On: 09/15/2024 17:19   CT Cervical Spine Wo Contrast Result Date: 09/15/2024 CLINICAL DATA:  Poly trauma fall dizzy EXAM: CT HEAD WITHOUT CONTRAST CT  CERVICAL SPINE WITHOUT CONTRAST TECHNIQUE: Multidetector CT imaging of the head and cervical spine was performed following the standard protocol without intravenous contrast. Multiplanar CT image reconstructions of the cervical spine were also generated. RADIATION DOSE REDUCTION: This exam was performed according to the departmental dose-optimization program which includes automated exposure control, adjustment of the mA and/or kV according to patient size and/or use of iterative reconstruction technique. COMPARISON:  Radiograph 08/18/2018, CT brain 09/13/2024 FINDINGS: CT HEAD FINDINGS Brain: No acute territorial infarction, hemorrhage or intracranial mass. Mild atrophy. Stable ventricle size. Partially empty sella Vascular: No hyperdense vessels.  Carotid vascular calcification. Skull: Normal. Negative for fracture or focal lesion. Sinuses/Orbits: No acute finding. Other: None CT CERVICAL SPINE FINDINGS Alignment: Straightening of the cervical spine. No subluxation. Facet alignment is normal Skull base and vertebrae: No acute fracture. No primary bone lesion or focal pathologic process. Soft tissues and spinal canal: No prevertebral fluid or swelling. No visible canal hematoma. Disc levels: Moderate severe diffuse disc space narrowing C3 through T1. No high-grade canal stenosis Upper chest: Negative. Other: None IMPRESSION: 1. No CT evidence for acute intracranial abnormality. Mild atrophy. 2. Straightening of the cervical spine with degenerative changes. No acute osseous abnormality. Electronically Signed   By: Luke Bun M.D.   On: 09/15/2024 17:19   DG Chest Portable 1 View Result Date: 09/15/2024 EXAM: 1 VIEW(S) XRAY OF THE CHEST 09/15/2024 03:23:07 PM COMPARISON: 07/21/2024 CLINICAL HISTORY: fever FINDINGS: LUNGS AND PLEURA: Low lung volumes. No focal pulmonary opacity. No pulmonary edema. No pleural effusion. No pneumothorax. HEART AND MEDIASTINUM: No acute abnormality of the cardiac and mediastinal  silhouettes. BONES AND SOFT TISSUES: Bilateral shoulder degenerative changes. Thoracic spondylosis. IMPRESSION: 1. No acute cardiopulmonary process. 2. Low lung volumes. Electronically signed by: Waddell Calk MD 09/15/2024 03:51 PM EDT RP Workstation: [id number]     Procedures   Medications Ordered in the ED  ceFEPIme (MAXIPIME) 2 g in sodium chloride  0.9 % 100 mL IVPB (0 g Intravenous Stopped 09/15/24 1802)  vancomycin (VANCOCIN) IVPB 1000 mg/200 mL premix (0 mg Intravenous Stopped 09/15/24 1845)    Followed by  vancomycin (VANCOCIN) 500 mg in sodium chloride  0.9 % 100 mL IVPB (0 mg Intravenous Stopped 09/15/24 2104)  sodium chloride  0.9 % bolus 1,000 mL (0 mLs Intravenous Stopped 09/15/24 1854)  iohexol (OMNIPAQUE) 350 MG/ML injection 100 mL (100 mLs Intravenous Contrast Given 09/15/24 2030)  acetaminophen  (TYLENOL ) tablet 1,000 mg (1,000 mg Oral Given 09/15/24 2101)  Medical Decision Making Amount and/or Complexity of Data Reviewed Labs: ordered. Radiology: ordered.  Risk OTC drugs. Prescription drug management. Decision regarding hospitalization.   Erica [name] is here general fatigue.  Patient has fever 100.6 but otherwise unremarkable vitals.  She has not felt well the last week.  She had a head CT earlier this week at primary care that was normal.  She continues to have dizziness fatigue especially with exertion and walking.  She has a history of chronic leukemia.  She is on oral chemotherapy for this.  She denies any chest pain cough shortness of breath weakness numbness tingling vision loss.  She does not really have any major or obvious infectious symptoms including pain with urination cough or sputum duction.  She did have some blood work done prior to my evaluation that shows a hemoglobin of 7.  This is down about 7 points from about 2 months ago.  However he Hemoccult is negative.  She has brown stool on exam.  She has not noticed any  black or bloody stools.  Overall we will expand workup to finish infectious workup with blood cultures lactic acid broad-spectrum IV antibiotics.  I do suspect this could be anemia related given significant drop in her hemoglobin.  Not sure if this is an oncology process or some other anemic process.  Anticipate admission.  Overall lactic acid is 2.2.  Fecal Hemoccult negative.  INR is unremarkable.  Electrolytes are unremarkable.  No AKI.  No significant leukocytosis.  Hemoglobin is 7 as we stated before.  Viral panel thus far negative.  This chart was dictated using voice recognition software.  Despite best efforts to proofread,  errors can occur which can change the documentation meaning.  CT of the head and neck are unremarkable.  Chest x-ray with no evidence of pneumonia pneumothorax.  Still awaiting the urinalysis.  I do think she could have some viral process but I am concerned more about her drop in hemoglobin here the last few weeks.  Will admit her for further infectious care and anemia care.  I think she benefit from transfusion.  I have no concern for meningitis or other acute infectious process at this time.  Is not having any abdominal pain no nausea vomiting or diarrhea.  I do not think she needs any CT imaging at this time.  Will talk to hospitalist about admission.  She has been broadly covered with IV antibiotics at this time.  IV fluids given.  Tylenol  given.  Will trend CBC.  Hemoglobin down to 6.3 but hemodynamically stable.  Hemoccult was negative.  Did get a CT scan of the chest abdomen pelvis that showed no PE.  Maybe pneumonia.  Some esophagitis or maybe esophageal mass.  Some sort of infiltrative tissue in the retroperitoneal area.  Could be some sort of neoplastic process.  Overall we will admit for further care.  This chart was dictated using voice recognition software.  Despite best efforts to proofread,  errors can occur which can change the documentation meaning.      Final  diagnoses:  SIRS (systemic inflammatory response syndrome) (HCC)  Symptomatic anemia    ED Discharge Orders     None          Ruthe Cornet, DO 09/15/24 1732    Ruthe Cornet, DO 09/15/24 2115

## 2024-09-15 NOTE — ED Notes (Signed)
 Blood obtained by unsuccessful IV attempt.

## 2024-09-15 NOTE — ED Triage Notes (Signed)
 Pt via pov from home with continued dizziness. She fell on Sunday and the dizziness started after that. Pt accompanied by her niece, who reports that pt has seen pcp twice - Tuesday and WEdnesday and had head ct on Tuesday. Pt reports she is feeling worse, not better. Pt has been eating a little, drinking a little. Pt a&o x4; nad noted.

## 2024-09-16 ENCOUNTER — Encounter (HOSPITAL_COMMUNITY): Payer: Self-pay | Admitting: Internal Medicine

## 2024-09-16 DIAGNOSIS — R6520 Severe sepsis without septic shock: Secondary | ICD-10-CM | POA: Diagnosis present

## 2024-09-16 DIAGNOSIS — I110 Hypertensive heart disease with heart failure: Secondary | ICD-10-CM | POA: Diagnosis present

## 2024-09-16 DIAGNOSIS — E119 Type 2 diabetes mellitus without complications: Secondary | ICD-10-CM

## 2024-09-16 DIAGNOSIS — J9811 Atelectasis: Secondary | ICD-10-CM | POA: Diagnosis present

## 2024-09-16 DIAGNOSIS — Z1152 Encounter for screening for COVID-19: Secondary | ICD-10-CM | POA: Diagnosis not present

## 2024-09-16 DIAGNOSIS — I10 Essential (primary) hypertension: Secondary | ICD-10-CM

## 2024-09-16 DIAGNOSIS — Z803 Family history of malignant neoplasm of breast: Secondary | ICD-10-CM | POA: Diagnosis not present

## 2024-09-16 DIAGNOSIS — A419 Sepsis, unspecified organism: Secondary | ICD-10-CM | POA: Diagnosis present

## 2024-09-16 DIAGNOSIS — D630 Anemia in neoplastic disease: Secondary | ICD-10-CM | POA: Diagnosis present

## 2024-09-16 DIAGNOSIS — E7800 Pure hypercholesterolemia, unspecified: Secondary | ICD-10-CM | POA: Diagnosis present

## 2024-09-16 DIAGNOSIS — D849 Immunodeficiency, unspecified: Secondary | ICD-10-CM | POA: Diagnosis present

## 2024-09-16 DIAGNOSIS — G9341 Metabolic encephalopathy: Secondary | ICD-10-CM | POA: Diagnosis present

## 2024-09-16 DIAGNOSIS — Z79899 Other long term (current) drug therapy: Secondary | ICD-10-CM | POA: Diagnosis not present

## 2024-09-16 DIAGNOSIS — J189 Pneumonia, unspecified organism: Secondary | ICD-10-CM | POA: Diagnosis present

## 2024-09-16 DIAGNOSIS — D649 Anemia, unspecified: Secondary | ICD-10-CM

## 2024-09-16 DIAGNOSIS — I341 Nonrheumatic mitral (valve) prolapse: Secondary | ICD-10-CM | POA: Diagnosis present

## 2024-09-16 DIAGNOSIS — K209 Esophagitis, unspecified without bleeding: Secondary | ICD-10-CM | POA: Diagnosis not present

## 2024-09-16 DIAGNOSIS — K5909 Other constipation: Secondary | ICD-10-CM | POA: Diagnosis present

## 2024-09-16 DIAGNOSIS — I700 Atherosclerosis of aorta: Secondary | ICD-10-CM | POA: Diagnosis present

## 2024-09-16 DIAGNOSIS — Z8249 Family history of ischemic heart disease and other diseases of the circulatory system: Secondary | ICD-10-CM | POA: Diagnosis not present

## 2024-09-16 DIAGNOSIS — G629 Polyneuropathy, unspecified: Secondary | ICD-10-CM

## 2024-09-16 DIAGNOSIS — Z7984 Long term (current) use of oral hypoglycemic drugs: Secondary | ICD-10-CM | POA: Diagnosis not present

## 2024-09-16 DIAGNOSIS — Z880 Allergy status to penicillin: Secondary | ICD-10-CM | POA: Diagnosis not present

## 2024-09-16 DIAGNOSIS — R509 Fever, unspecified: Secondary | ICD-10-CM

## 2024-09-16 DIAGNOSIS — W19XXXA Unspecified fall, initial encounter: Secondary | ICD-10-CM | POA: Diagnosis present

## 2024-09-16 DIAGNOSIS — R6510 Systemic inflammatory response syndrome (SIRS) of non-infectious origin without acute organ dysfunction: Principal | ICD-10-CM

## 2024-09-16 DIAGNOSIS — E1140 Type 2 diabetes mellitus with diabetic neuropathy, unspecified: Secondary | ICD-10-CM | POA: Diagnosis present

## 2024-09-16 DIAGNOSIS — E872 Acidosis, unspecified: Secondary | ICD-10-CM | POA: Diagnosis present

## 2024-09-16 DIAGNOSIS — R4182 Altered mental status, unspecified: Secondary | ICD-10-CM

## 2024-09-16 DIAGNOSIS — C9210 Chronic myeloid leukemia, BCR/ABL-positive, not having achieved remission: Secondary | ICD-10-CM

## 2024-09-16 DIAGNOSIS — I5032 Chronic diastolic (congestive) heart failure: Secondary | ICD-10-CM | POA: Diagnosis present

## 2024-09-16 DIAGNOSIS — Z882 Allergy status to sulfonamides status: Secondary | ICD-10-CM | POA: Diagnosis not present

## 2024-09-16 LAB — CBC
HCT: 18.6 % — ABNORMAL LOW (ref 36.0–46.0)
HCT: 21.6 % — ABNORMAL LOW (ref 36.0–46.0)
HCT: 25.5 % — ABNORMAL LOW (ref 36.0–46.0)
Hemoglobin: 6.2 g/dL — CL (ref 12.0–15.0)
Hemoglobin: 6.9 g/dL — CL (ref 12.0–15.0)
Hemoglobin: 7.9 g/dL — ABNORMAL LOW (ref 12.0–15.0)
MCH: 27.2 pg (ref 26.0–34.0)
MCH: 26.8 pg (ref 26.0–34.0)
MCH: 28.1 pg (ref 26.0–34.0)
MCHC: 33.3 g/dL (ref 30.0–36.0)
MCHC: 31.9 g/dL (ref 30.0–36.0)
MCHC: 31 g/dL (ref 30.0–36.0)
MCV: 81.6 fL (ref 80.0–100.0)
MCV: 84 fL (ref 80.0–100.0)
MCV: 90.7 fL (ref 80.0–100.0)
Platelets: 258 K/uL (ref 150–400)
Platelets: 279 K/uL (ref 150–400)
Platelets: 227 K/uL (ref 150–400)
RBC: 2.28 MIL/uL — ABNORMAL LOW (ref 3.87–5.11)
RBC: 2.57 MIL/uL — ABNORMAL LOW (ref 3.87–5.11)
RBC: 2.81 MIL/uL — ABNORMAL LOW (ref 3.87–5.11)
RDW: 14.7 % (ref 11.5–15.5)
RDW: 14.8 % (ref 11.5–15.5)
RDW: 17.2 % — ABNORMAL HIGH (ref 11.5–15.5)
WBC: 7.7 K/uL (ref 4.0–10.5)
WBC: 7.6 K/uL (ref 4.0–10.5)
WBC: 7.2 K/uL (ref 4.0–10.5)
nRBC: 0 % (ref 0.0–0.2)
nRBC: 0 % (ref 0.0–0.2)
nRBC: 0 % (ref 0.0–0.2)

## 2024-09-16 LAB — RETICULOCYTES
Immature Retic Fract: 9.7 % (ref 2.3–15.9)
RBC.: 2.3 MIL/uL — ABNORMAL LOW (ref 3.87–5.11)
Retic Count, Absolute: 7.4 K/uL — ABNORMAL LOW (ref 19.0–186.0)
Retic Ct Pct: <0.4 % — ABNORMAL LOW (ref 0.4–3.1)

## 2024-09-16 LAB — COMPREHENSIVE METABOLIC PANEL WITH GFR
ALT: 7 U/L (ref 0–44)
AST: 38 U/L (ref 15–41)
Albumin: 3.3 g/dL — ABNORMAL LOW (ref 3.5–5.0)
Alkaline Phosphatase: 76 U/L (ref 38–126)
Anion gap: 14 (ref 5–15)
BUN: 8 mg/dL (ref 8–23)
CO2: 21 mmol/L — ABNORMAL LOW (ref 22–32)
Calcium: 8.8 mg/dL — ABNORMAL LOW (ref 8.9–10.3)
Chloride: 99 mmol/L (ref 98–111)
Creatinine, Ser: 0.71 mg/dL (ref 0.44–1.00)
GFR, Estimated: >60 mL/min (ref >60)
Glucose, Bld: 227 mg/dL — ABNORMAL HIGH (ref 70–99)
Potassium: 4.1 mmol/L (ref 3.5–5.1)
Sodium: 134 mmol/L — ABNORMAL LOW (ref 135–145)
Total Bilirubin: 0.5 mg/dL (ref 0.0–1.2)
Total Protein: 6 g/dL — ABNORMAL LOW (ref 6.5–8.1)

## 2024-09-16 LAB — PREPARE RBC (CROSSMATCH)

## 2024-09-16 LAB — TSH: TSH: 1.12 u[IU]/mL (ref 0.350–4.500)

## 2024-09-16 LAB — FERRITIN: Ferritin: 1283 ng/mL — ABNORMAL HIGH (ref 11–307)

## 2024-09-16 LAB — LACTIC ACID, PLASMA
Lactic Acid, Venous: 4.1 mmol/L (ref 0.5–1.9)
Lactic Acid, Venous: 2.9 mmol/L (ref 0.5–1.9)

## 2024-09-16 LAB — IRON AND TIBC
Iron: 49 ug/dL (ref 28–170)
Saturation Ratios: 23 % (ref 10.4–31.8)
TIBC: 211 ug/dL — ABNORMAL LOW (ref 250–450)
UIBC: 163 ug/dL

## 2024-09-16 LAB — FOLATE: Folate: 4.6 ng/mL — ABNORMAL LOW (ref >5.9)

## 2024-09-16 LAB — ABO/RH: ABO/RH(D): O POS

## 2024-09-16 LAB — VITAMIN B12: Vitamin B-12: 1462 pg/mL — ABNORMAL HIGH (ref 180–914)

## 2024-09-16 LAB — AMMONIA: Ammonia: 25 umol/L (ref 9–35)

## 2024-09-16 MED ORDER — ACETAMINOPHEN 325 MG PO TABS
650.0000 mg | ORAL_TABLET | Freq: Four times a day (QID) | ORAL | Status: DC | PRN
  Administered 2024-09-16 – 2024-09-22 (×9): 650 mg via ORAL
  Filled 2024-09-16 (×9): qty 2

## 2024-09-16 MED ORDER — SODIUM CHLORIDE 0.9 % IV SOLN
2.0000 g | Freq: Two times a day (BID) | INTRAVENOUS | Status: AC
  Administered 2024-09-16 – 2024-09-19 (×8): 2 g via INTRAVENOUS
  Filled 2024-09-16 (×8): qty 12.5

## 2024-09-16 MED ORDER — ALLOPURINOL 100 MG PO TABS: 300.0000 mg | ORAL_TABLET | Freq: Every day | ORAL | Status: DC

## 2024-09-16 MED ORDER — SODIUM CHLORIDE 0.9 % IV BOLUS
1000.0000 mL | Freq: Once | INTRAVENOUS | Status: AC
  Administered 2024-09-16: 1000 mL via INTRAVENOUS

## 2024-09-16 MED ORDER — LACTATED RINGERS IV BOLUS
1000.0000 mL | Freq: Once | INTRAVENOUS | Status: AC
  Administered 2024-09-16: 1000 mL via INTRAVENOUS

## 2024-09-16 MED ORDER — ONDANSETRON HCL 4 MG PO TABS: 4.0000 mg | ORAL_TABLET | Freq: Four times a day (QID) | ORAL | Status: DC | PRN

## 2024-09-16 MED ORDER — SODIUM CHLORIDE 0.9% FLUSH: 3.0000 mL | Freq: Two times a day (BID) | INTRAVENOUS | Status: DC

## 2024-09-16 MED ORDER — SODIUM CHLORIDE 0.9% IV SOLUTION: Freq: Once | INTRAVENOUS | Status: DC

## 2024-09-16 MED ORDER — ENSURE PLUS HIGH PROTEIN PO LIQD
237.0000 mL | Freq: Two times a day (BID) | ORAL | Status: DC
  Administered 2024-09-17 – 2024-09-19 (×4): 237 mL via ORAL

## 2024-09-16 MED ORDER — VANCOMYCIN HCL 750 MG/150ML IV SOLN
750.0000 mg | INTRAVENOUS | Status: DC
  Administered 2024-09-16 – 2024-09-18 (×3): 750 mg via INTRAVENOUS
  Filled 2024-09-16 (×3): qty 150

## 2024-09-16 MED ORDER — SODIUM CHLORIDE 0.9% FLUSH: 3.0000 mL | INTRAVENOUS | Status: DC | PRN

## 2024-09-16 MED ORDER — SODIUM CHLORIDE 0.9 % IV SOLN: INTRAVENOUS | Status: AC

## 2024-09-16 MED ORDER — PANTOPRAZOLE SODIUM 40 MG IV SOLR
40.0000 mg | INTRAVENOUS | Status: DC
  Administered 2024-09-16 – 2024-09-19 (×4): 40 mg via INTRAVENOUS
  Filled 2024-09-16 (×4): qty 10

## 2024-09-16 MED ORDER — LACTATED RINGERS IV SOLN: INTRAVENOUS | Status: DC

## 2024-09-16 MED ORDER — SODIUM CHLORIDE 0.9 % IV SOLN: 250.0000 mL | INTRAVENOUS | Status: DC | PRN

## 2024-09-16 MED ORDER — ACETAMINOPHEN 650 MG RE SUPP: 650.0000 mg | Freq: Four times a day (QID) | RECTAL | Status: DC | PRN

## 2024-09-16 MED ORDER — ONDANSETRON HCL 4 MG/2ML IJ SOLN
4.0000 mg | Freq: Four times a day (QID) | INTRAMUSCULAR | Status: DC | PRN
  Administered 2024-09-17: 4 mg via INTRAVENOUS
  Filled 2024-09-16: qty 2

## 2024-09-16 MED ORDER — LORAZEPAM 1 MG PO TABS
1.0000 mg | ORAL_TABLET | Freq: Once | ORAL | Status: AC
  Administered 2024-09-16: 1 mg via ORAL
  Filled 2024-09-16: qty 1

## 2024-09-16 MED ORDER — LACTATED RINGERS IV BOLUS
1000.0000 mL | INTRAVENOUS | Status: AC
  Administered 2024-09-16: 1000 mL via INTRAVENOUS

## 2024-09-16 NOTE — ED Notes (Signed)
 Blood bank has blood ready.  Notified Jared,RN.

## 2024-09-16 NOTE — ED Notes (Addendum)
 Date and time results received: 09/16/24 6:45 AM  (use smartphrase .now to insert current time)  Test: Lactic acid Critical Value: 4.1  Name of Provider Notified: Sundil   Orders Received? Or Actions Taken?: MAR

## 2024-09-16 NOTE — ED Provider Notes (Signed)
 Patient resting comfortably, vitals are appropriate, easily arousable.  Patient will be transferred   Midge Golas, MD 09/16/24 [phone]

## 2024-09-16 NOTE — Plan of Care (Signed)
  Initial lactic acid was 2.1 which is trended up to 4.1. Initiated 2 L of LR bolus followed by continue maintenance fluid LR 125 cc/h.  Continue broad-spectrum antibiotic coverage with vancomycin and cefepime.  Continue to trend lactic acid level.   Oluwatobi Visser, MD [facility] 09/16/2024, 6:50 AM

## 2024-09-16 NOTE — Sepsis Progress Note (Signed)
 Elink monitoring for the code sepsis protocol.

## 2024-09-16 NOTE — ED Notes (Signed)
 Pt is restless and agitated.  She is telling me she wants to leave and pulling her gown off.  Family member at the bedside.  Attempt to comfort pt and brought her ginger ale and food.  MD notified. Await orders.

## 2024-09-16 NOTE — Consult Note (Signed)
 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  Previously it was around 11. - She was given tenets of PRBC transfusion today. - Continue to monitor CBCD daily and transfuse as needed to maintain hemoglobin closer to 8. - Iron studies within normal limits.  B12 also normal/elevated.  TSH normal.  Folate decreased at 4.6.  Please start her on folic acid supplementation orally 1 mg daily. - To look for other causes of anemia, we will check LDH, haptoglobin, Coombs test with morning set of labs.  CML - Patient has been on Ponatinib  40 mg daily.  White count is within normal limits. - Given concern for sepsis/SIRS, it is okay to hold Ponatinib  at this time.  Recommend continuing broad-spectrum antibiotics at this time, until infectious source can be ruled out.  Rest of care as per primary team and other specialties.  Thanks for the opportunity to participate in the care of this patient. Please contact me if there are any questions.   Chinita Patten, MD Medical Oncology and Hematology 09/16/2024 12:21 PM   HISTORY OF PRESENT ILLNESS:  Erica [name] is a 78 y.o. 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  Initially treated with Dasatinib , later switched to Ponatinib  since February 2025.  She currently follows with Dr. Perri at [facility] for her [facility] management.  [facility] was [facility] after she sustained a fall recently and had ongoing dizziness.  She was started to feel worse, tachypneic and had low-grade fever.  Labs in the ED showed no leukocytosis but hemoglobin was 6.3.  Platelet count was normal.  She was sent to [facility] for admission for further evaluation and management of symptomatic anemia.  With concern for concurrent infection, sepsis, fever of unknown origin, she is on broad-spectrum antibiotics.  Lactic acid was 4.1 at its peak, improved with IV fluids.  CT angiogram of chest ruled out pulmonary embolism.  She was given 2 units of PRBC transfusion.  INTERVAL HISTORY:  Patient seen and evaluated.  She could not contribute much to the history.  Her niece was by the bedside.  Chart reviewed.  MEDICAL HISTORY Past Medical History:  Diagnosis Date   Bilateral swelling of feet    Cancer (HCC)    Cataract    Diabetes mellitus without complication (HCC)    diet controlled   Eczema    Elevated cholesterol    GERD (gastroesophageal reflux disease)    Glaucoma    Hypertension    Mitral valve prolapse    Simple endometrial hyperplasia without atypia 07/2016   In endometrial polyp with surrounding endometrium inactive recommend follow up ultrasound for endometrial echo 1 year   Spondylisthesis      SURGICAL HISTORY Past Surgical History:  Procedure Laterality Date   CESAREAN SECTION     DILATATION & CURETTAGE/HYSTEROSCOPY WITH MYOSURE N/A 08/05/2016   Procedure: DILATATION & CURETTAGE/HYSTEROSCOPY WITH MYOSURE;  Surgeon: Evalene SHAUNNA Organ, MD;  Location: [facility];  Service: Gynecology;  Laterality: N/A;   EXCISION OF SKIN TAG Left 08/05/2016   Procedure: EXCISION OF SKIN TAG;  Surgeon: Evalene SHAUNNA Organ, MD;  Location: [facility];  Service: Gynecology;  Laterality: Left;   MOUTH SURGERY     spinal tap     THORACENTESIS N/A 11/02/2023   Procedure: THORACENTESIS;  Surgeon: Annella Donnice SAUNDERS, MD;  Location: [facility] ENDOSCOPY;   Service: Pulmonary;  Laterality: N/A;     ALLERGIES  Allergies  Allergen Reactions   Penicillins Hives    Has patient had a PCN reaction causing immediate rash, facial/tongue/throat swelling, SOB or lightheadedness with hypotension: Yes Has patient had a PCN reaction causing severe rash involving mucus membranes or skin necrosis: Yes Has patient had a PCN reaction that required hospitalization No Has patient had a PCN reaction occurring within the last 10 years: No If all of the above answers are NO, then may proceed with Cephalosporin use.    Sulfa Antibiotics Hives    FAMILY HISTORY  Family History  Problem Relation Age of Onset   Heart disease Mother    Heart disease Father    Breast cancer Sister 77     SOCIAL HISTORY   Social History   Socioeconomic History   Marital status: Divorced    Spouse name: Not on file   Number of children: Not on file   Years of education: Not on file   Highest education level: Not on file  Occupational History   Not on file  Tobacco Use   Smoking status: Never   Smokeless tobacco: Never  Vaping Use   Vaping status: Never Used  Substance and Sexual Activity   Alcohol use: No    Alcohol/week: 0.0 standard drinks of alcohol   Drug use: No   Sexual activity: Not Currently    Birth control/protection: Post-menopausal    Comment: 1st intercourse 78 yo-Fewer than 5 partners  Other Topics Concern   Not on file  Social History Narrative   Not on file   Social Drivers of Health   Financial Resource Strain: Not on file  Food Insecurity: No Food Insecurity (11/19/2023)   Hunger Vital Sign    Worried About Running Out of Food in the Last Year: Never true    Ran Out of Food in the Last Year: Never true  Transportation Needs: No Transportation Needs (11/19/2023)   PRAPARE - Administrator, Civil Service (Medical): No    Lack of Transportation (Non-Medical): No  Physical Activity: Not on file  Stress: Not on file  Social  Connections: Not on file  Intimate Partner Violence: Not At Risk (11/19/2023)   Humiliation, Afraid, Rape, and Kick questionnaire    Fear of Current or Ex-Partner: No    Emotionally Abused: No    Physically Abused: No    Sexually Abused: No    CURRENT MEDICATIONS   Current Outpatient Medications  Medication Instructions   allopurinol  (ZYLOPRIM ) 300 MG tablet 1 tablet, Oral, Daily   aspirin  EC 81 mg, Oral, Daily, Swallow whole.   cefUROXime (CEFTIN) 500 mg, 2 times daily   fluticasone (FLONASE) 50 MCG/ACT nasal spray 1 spray, Daily PRN   furosemide  (LASIX ) 20 mg, Daily   gabapentin  (NEURONTIN ) 100 mg, 3 times daily   ibuprofen (ADVIL) 800 mg, Every 8 hours PRN   latanoprost  (XALATAN ) 0.005 % ophthalmic solution 1 drop, Daily at bedtime   loratadine (CLARITIN) 10 mg, Oral, Daily PRN   PONATinib  HCl (ICLUSIG )  45 mg, Daily   propranolol  (INDERAL ) 10 mg, 2 times daily PRN   traMADol  (ULTRAM ) 50 mg, Every 6 hours PRN   triamcinolone cream (KENALOG) 0.1 % 1 application , 2 times daily PRN     REVIEW OF SYSTEMS   Review of Systems - Oncology  All other pertinent review of systems is negative except as mentioned above in HPI  PHYSICAL EXAMINATION   Vitals:   09/16/24 1145 09/16/24 1220  BP: (!) 117/44   Pulse: (!) 101 100  Resp: (!) 29 19  Temp:  (!) 101.3 F (38.5 C)  SpO2: 96% 95%   Filed Weights   09/15/24 1419  Weight: 160 lb 7.9 oz (72.8 kg)    Physical Exam Constitutional:      Comments: Tired appearing, frail  HENT:     Head: Normocephalic and atraumatic.  Cardiovascular:     Rate and Rhythm: Normal rate.     Heart sounds: Normal heart sounds.  Pulmonary:     Effort: Pulmonary effort is normal. No respiratory distress.     Breath sounds: Normal breath sounds.  Abdominal:     General: There is no distension.  Neurological:     Comments: Sleepy, could not participate in interview     LABORATORY DATA:   I have reviewed the data as listed  Results for  orders placed or performed during the hospital encounter of 09/15/24 (from the past 24 hours)  CBG monitoring, ED   Collection Time: 09/15/24  2:08 PM  Result Value Ref Range   Glucose-Capillary 133 (H) 70 - 99 mg/dL  Resp panel by RT-PCR (RSV, Flu A&B, Covid) Anterior Nasal Swab   Collection Time: 09/15/24  2:26 PM   Specimen: Anterior Nasal Swab  Result Value Ref Range   SARS Coronavirus 2 by RT PCR NEGATIVE NEGATIVE   Influenza A by PCR NEGATIVE NEGATIVE   Influenza B by PCR NEGATIVE NEGATIVE   Resp Syncytial Virus by PCR NEGATIVE NEGATIVE  Comprehensive metabolic panel   Collection Time: 09/15/24  2:26 PM  Result Value Ref Range   Sodium 132 (L) 135 - 145 mmol/L   Potassium 4.6 3.5 - 5.1 mmol/L   Chloride 97 (L) 98 - 111 mmol/L   CO2 25 22 - 32 mmol/L   Glucose, Bld 157 (H) 70 - 99 mg/dL   BUN 6 (L) 8 - 23 mg/dL   Creatinine, Ser 9.32 0.44 - 1.00 mg/dL   Calcium 9.5 8.9 - 89.6 mg/dL   Total Protein 6.5 6.5 - 8.1 g/dL   Albumin 3.6 3.5 - 5.0 g/dL   AST 30 15 - 41 U/L   ALT 5 0 - 44 U/L   Alkaline Phosphatase 84 38 - 126 U/L   Total Bilirubin 0.5 0.0 - 1.2 mg/dL   GFR, Estimated >39 >39 mL/min   Anion gap 10 5 - 15  CBC   Collection Time: 09/15/24  2:26 PM  Result Value Ref Range   WBC 7.5 4.0 - 10.5 K/uL   RBC 2.58 (L) 3.87 - 5.11 MIL/uL   Hemoglobin 7.0 (L) 12.0 - 15.0 g/dL   HCT 78.9 (L) 63.9 - 53.9 %   MCV 81.4 80.0 - 100.0 fL   MCH 27.1 26.0 - 34.0 pg   MCHC 33.3 30.0 - 36.0 g/dL   RDW 85.3 88.4 - 84.4 %   Platelets 188 150 - 400 K/uL   nRBC 0.0 0.0 - 0.2 %  Occult blood card to lab, stool   Collection  Time: 09/15/24  3:09 PM  Result Value Ref Range   Fecal Occult Bld NEGATIVE NEGATIVE  Vitamin B12   Collection Time: 09/15/24  4:38 PM  Result Value Ref Range   Vitamin B-12 912 180 - 914 pg/mL  Folate   Collection Time: 09/15/24  4:38 PM  Result Value Ref Range   Folate 7.7 >5.9 ng/mL  Iron and TIBC   Collection Time: 09/15/24  4:38 PM  Result Value  Ref Range   Iron 46 28 - 170 ug/dL   TIBC 760 (L) 749 - 549 ug/dL   Saturation Ratios 19 10.4 - 31.8 %   UIBC 193 ug/dL  Ferritin   Collection Time: 09/15/24  4:38 PM  Result Value Ref Range   Ferritin 1,294 (H) 11 - 307 ng/mL  Reticulocytes   Collection Time: 09/15/24  4:38 PM  Result Value Ref Range   Retic Ct Pct <0.4 (L) 0.4 - 3.1 %   RBC. 2.52 (L) 3.87 - 5.11 MIL/uL   Retic Count, Absolute 6.4 (L) 19.0 - 186.0 K/uL   Immature Retic Fract 5.6 2.3 - 15.9 %  Protime-INR   Collection Time: 09/15/24  4:38 PM  Result Value Ref Range   Prothrombin Time 15.0 11.4 - 15.2 seconds   INR 1.1 0.8 - 1.2  Lactic acid, plasma   Collection Time: 09/15/24  4:40 PM  Result Value Ref Range   Lactic Acid, Venous 2.2 (HH) 0.5 - 1.9 mmol/L  Blood culture (routine x 2)   Collection Time: 09/15/24  4:50 PM   Specimen: BLOOD  Result Value Ref Range   Specimen Description      BLOOD BLOOD LEFT FOREARM Performed at [facility], [address] [place], [place] [address]    Special Requests      BOTTLES DRAWN AEROBIC AND ANAEROBIC Blood Culture adequate volume Performed at [facility], [address] [place], [place] [address]    Culture      NO GROWTH < 12 HOURS Performed at [facility], [address] [place] [address], [place], [place] [address]    Report Status PENDING   Lactic acid, plasma   Collection Time: 09/15/24  6:48 PM  Result Value Ref Range   Lactic Acid, Venous 2.1 (HH) 0.5 - 1.9 mmol/L  CBC   Collection Time: 09/15/24  6:49 PM  Result Value Ref Range   WBC 7.5 4.0 - 10.5 K/uL   RBC 2.28 (L) 3.87 - 5.11 MIL/uL   Hemoglobin 6.3 (LL) 12.0 - 15.0 g/dL   HCT 81.3 (L) 63.9 - 53.9 %   MCV 81.6 80.0 - 100.0 fL   MCH 27.6 26.0 - 34.0 pg   MCHC 33.9 30.0 - 36.0 g/dL   RDW 85.2 88.4 - 84.4 %   Platelets 277 150 - 400 K/uL   nRBC 0.0 0.0 - 0.2 %  Urinalysis, Routine w reflex microscopic -Urine, Unspecified Source   Collection Time: 09/15/24   7:42 PM  Result Value Ref Range   Color, Urine YELLOW YELLOW   APPearance CLEAR CLEAR   Specific Gravity, Urine 1.020 1.005 - 1.030   pH 8.0 5.0 - 8.0   Glucose, UA NEGATIVE NEGATIVE mg/dL   Hgb urine dipstick NEGATIVE NEGATIVE   Bilirubin Urine NEGATIVE NEGATIVE   Ketones, ur NEGATIVE NEGATIVE mg/dL   Protein, ur 30 (A) NEGATIVE mg/dL   Nitrite NEGATIVE NEGATIVE   Leukocytes,Ua NEGATIVE NEGATIVE   RBC / HPF 0-5 0 - 5 RBC/hpf   WBC, UA 0-5  0 - 5 WBC/hpf   Bacteria, UA RARE (A) NONE SEEN   Squamous Epithelial / HPF 6-10 0 - 5 /HPF   Mucus PRESENT   CBC   Collection Time: 09/16/24  1:35 AM  Result Value Ref Range   WBC 7.7 4.0 - 10.5 K/uL   RBC 2.28 (L) 3.87 - 5.11 MIL/uL   Hemoglobin 6.2 (LL) 12.0 - 15.0 g/dL   HCT 81.3 (L) 63.9 - 53.9 %   MCV 81.6 80.0 - 100.0 fL   MCH 27.2 26.0 - 34.0 pg   MCHC 33.3 30.0 - 36.0 g/dL   RDW 85.2 88.4 - 84.4 %   Platelets 258 150 - 400 K/uL   nRBC 0.0 0.0 - 0.2 %  Prepare RBC (crossmatch)   Collection Time: 09/16/24  5:59 AM  Result Value Ref Range   Order Confirmation      ORDER PROCESSED BY BLOOD BANK Performed at [facility], [address], [place], [place] [address]   CBC   Collection Time: 09/16/24  6:00 AM  Result Value Ref Range   WBC 7.6 4.0 - 10.5 K/uL   RBC 2.57 (L) 3.87 - 5.11 MIL/uL   Hemoglobin 6.9 (LL) 12.0 - 15.0 g/dL   HCT 78.3 (L) 63.9 - 53.9 %   MCV 84.0 80.0 - 100.0 fL   MCH 26.8 26.0 - 34.0 pg   MCHC 31.9 30.0 - 36.0 g/dL   RDW 85.1 88.4 - 84.4 %   Platelets 279 150 - 400 K/uL   nRBC 0.0 0.0 - 0.2 %  Lactic acid, plasma   Collection Time: 09/16/24  6:00 AM  Result Value Ref Range   Lactic Acid, Venous 4.1 (HH) 0.5 - 1.9 mmol/L  Comprehensive metabolic panel   Collection Time: 09/16/24  6:00 AM  Result Value Ref Range   Sodium 134 (L) 135 - 145 mmol/L   Potassium 4.1 3.5 - 5.1 mmol/L   Chloride 99 98 - 111 mmol/L   CO2 21 (L) 22 - 32 mmol/L   Glucose, Bld 227 (H) 70 - 99 mg/dL   BUN  8 8 - 23 mg/dL   Creatinine, Ser 9.28 0.44 - 1.00 mg/dL   Calcium 8.8 (L) 8.9 - 10.3 mg/dL   Total Protein 6.0 (L) 6.5 - 8.1 g/dL   Albumin 3.3 (L) 3.5 - 5.0 g/dL   AST 38 15 - 41 U/L   ALT 7 0 - 44 U/L   Alkaline Phosphatase 76 38 - 126 U/L   Total Bilirubin 0.5 0.0 - 1.2 mg/dL   GFR, Estimated >39 >39 mL/min   Anion gap 14 5 - 15  Type and screen [facility]   Collection Time: 09/16/24  6:00 AM  Result Value Ref Range   ABO/RH(D) O POS    Antibody Screen NEG    Sample Expiration 09/19/2024,2359    Unit Number [id number]    Blood Component Type RED CELLS,LR    Unit division 00    Status of Unit ALLOCATED    Transfusion Status OK TO TRANSFUSE    Crossmatch Result Compatible    Unit Number [id number]    Blood Component Type RED CELLS,LR    Unit division 00    Status of Unit ISSUED    Transfusion Status OK TO TRANSFUSE    Crossmatch Result      Compatible Performed at [facility], [address], [place], [place] [address]   [facility]   Collection Time: 09/16/24  6:00 AM  Result Value Ref Range   Blood Product Unit Number [id number]    PRODUCT CODE [id number]    Unit Type and Rh 5100    Blood Product Expiration Date 797488757640    ISSUE DATE / TIME [id number]    Blood Product Unit Number [id number]    PRODUCT CODE [id number]    Unit Type and Rh 5100    Blood Product Expiration Date 797488757640   ABO/Rh   Collection Time: 09/16/24  7:00 AM  Result Value Ref Range   ABO/RH(D)      O POS Performed at [facility], [address] [place], [place] [address]   Vitamin B12   Collection Time: 09/16/24  8:00 AM  Result Value Ref Range   Vitamin B-12 1,462 (H) 180 - 914 pg/mL  Folate   Collection Time: 09/16/24  8:00 AM  Result Value Ref Range   Folate 4.6 (L) >5.9 ng/mL  Iron and TIBC   Collection Time: 09/16/24  8:00 AM  Result Value Ref Range   Iron 49 28 - 170 ug/dL   TIBC 788 (L) 749 - 549  ug/dL   Saturation Ratios 23 10.4 - 31.8 %   UIBC 163 ug/dL  Ferritin   Collection Time: 09/16/24  8:00 AM  Result Value Ref Range   Ferritin 1,283 (H) 11 - 307 ng/mL  Reticulocytes   Collection Time: 09/16/24  8:00 AM  Result Value Ref Range   Retic Ct Pct <0.4 (L) 0.4 - 3.1 %   RBC. 2.30 (L) 3.87 - 5.11 MIL/uL   Retic Count, Absolute 7.4 (L) 19.0 - 186.0 K/uL   Immature Retic Fract 9.7 2.3 - 15.9 %  Ammonia   Collection Time: 09/16/24  8:00 AM  Result Value Ref Range   Ammonia 25 9 - 35 umol/L  TSH   Collection Time: 09/16/24  8:00 AM  Result Value Ref Range   TSH 1.120 0.350 - 4.500 uIU/mL  Lactic acid, plasma   Collection Time: 09/16/24 10:56 AM  Result Value Ref Range   Lactic Acid, Venous 2.9 (HH) 0.5 - 1.9 mmol/L      RADIOGRAPHIC STUDIES:  I have personally reviewed the radiological images as listed and agree with the findings in the report.  CT Angio Chest PE W and/or Wo Contrast Result Date: 09/15/2024 CLINICAL DATA:  Dizziness and fatigue. Concern for pulmonary embolism. Abdominal pain. EXAM: CT ANGIOGRAPHY CHEST CT ABDOMEN AND PELVIS WITH CONTRAST TECHNIQUE: Multidetector CT imaging of the chest was performed using the standard protocol during bolus administration of intravenous contrast. Multiplanar CT image reconstructions and MIPs were obtained to evaluate the vascular anatomy. Multidetector CT imaging of the abdomen and pelvis was performed using the standard protocol during bolus administration of intravenous contrast. RADIATION DOSE REDUCTION: This exam was performed according to the departmental dose-optimization program which includes automated exposure control, adjustment of the mA and/or kV according to patient size and/or use of iterative reconstruction technique. CONTRAST:  OMNIPAQUE IOHEXOL 350 MG/ML SOLN COMPARISON:  CT abdomen pelvis dated 08/02/2024 FINDINGS: Evaluation of this exam is limited due to respiratory motion and streak artifact caused by  patient's arms. CTA CHEST FINDINGS Cardiovascular: There is no cardiomegaly or pericardial effusion. The thoracic aorta is unremarkable. The origins of the great vessels of the aortic arch are patent. Evaluation of the pulmonary arteries is limited due to respiratory motion. No central pulmonary artery embolus identified. Mediastinum/Nodes: No hilar or mediastinal adenopathy. Mild thickened appearance of the esophagus  may be related to underdistention. Esophagitis or underlying esophageal mass is not excluded. Clinical correlation is recommended. Lungs/Pleura: Small right pleural effusion. Subsegmental right lung base atelectasis. Pneumonia is not excluded. There is no pneumothorax. The central airways are patent. Musculoskeletal: Degenerative changes of the spine. No acute osseous pathology. Review of the MIP images confirms the above findings. CT ABDOMEN and PELVIS FINDINGS No intra-abdominal free air or free fluid. Hepatobiliary: Similar appearance of faint hypodense lesions in the left lobe of the liver not characterized on this CT. This can be better evaluated with ultrasound or MRI on a nonemergent/outpatient basis. No biliary dilatation. No calcified gallstone. Pancreas: The pancreas is suboptimally evaluated due to respiratory motion but grossly unremarkable. Spleen: Normal in size without focal abnormality. Adrenals/Urinary Tract: The adrenal glands unremarkable. The kidneys, and urinary bladder unremarkable. Stomach/Bowel: There is no bowel obstruction or active inflammation. The appendix is normal. Vascular/Lymphatic: Mild aortoiliac atherosclerotic disease. The IVC is unremarkable. No portal venous gas. Indeterminate 1.8 x 2.9 cm infiltrative tissue in the portacaval region (20/4) of indeterminate etiology but suspicious for adenopathy or retroperitoneal fibrosis or a neoplastic process. This can be better evaluated with MRI without and with contrast on a nonemergent basis. Reproductive: The uterus is  grossly unremarkable. No suspicious adnexal masses. Other: None Musculoskeletal: Degenerative changes of the spine and hips. No acute osseous pathology. IMPRESSION: 1. No CT evidence of central pulmonary artery embolus. 2. Small right pleural effusion with subsegmental right lung base atelectasis. Pneumonia is not excluded. 3. Mild thickened appearance of the esophagus may be related to underdistention. Esophagitis or underlying esophageal mass is not excluded. Endoscopy may provide better evaluation. 4. No bowel obstruction. Normal appendix. 5. Indeterminate 1.8 x 2.9 cm infiltrative tissue in the portacaval region of indeterminate etiology but suspicious for adenopathy or retroperitoneal fibrosis or a neoplastic process. This can be better evaluated with MRI without and with contrast on a nonemergent basis. 6.  Aortic Atherosclerosis (ICD10-I70.0). Electronically Signed   By: Vanetta Chou M.D.   On: 09/15/2024 20:54   CT ABDOMEN PELVIS W CONTRAST Result Date: 09/15/2024 CLINICAL DATA:  Dizziness and fatigue. Concern for pulmonary embolism. Abdominal pain. EXAM: CT ANGIOGRAPHY CHEST CT ABDOMEN AND PELVIS WITH CONTRAST TECHNIQUE: Multidetector CT imaging of the chest was performed using the standard protocol during bolus administration of intravenous contrast. Multiplanar CT image reconstructions and MIPs were obtained to evaluate the vascular anatomy. Multidetector CT imaging of the abdomen and pelvis was performed using the standard protocol during bolus administration of intravenous contrast. RADIATION DOSE REDUCTION: This exam was performed according to the departmental dose-optimization program which includes automated exposure control, adjustment of the mA and/or kV according to patient size and/or use of iterative reconstruction technique. CONTRAST:  OMNIPAQUE IOHEXOL 350 MG/ML SOLN COMPARISON:  CT abdomen pelvis dated 08/02/2024 FINDINGS: Evaluation of this exam is limited due to respiratory  motion and streak artifact caused by patient's arms. CTA CHEST FINDINGS Cardiovascular: There is no cardiomegaly or pericardial effusion. The thoracic aorta is unremarkable. The origins of the great vessels of the aortic arch are patent. Evaluation of the pulmonary arteries is limited due to respiratory motion. No central pulmonary artery embolus identified. Mediastinum/Nodes: No hilar or mediastinal adenopathy. Mild thickened appearance of the esophagus may be related to underdistention. Esophagitis or underlying esophageal mass is not excluded. Clinical correlation is recommended. Lungs/Pleura: Small right pleural effusion. Subsegmental right lung base atelectasis. Pneumonia is not excluded. There is no pneumothorax. The central airways are patent. Musculoskeletal: Degenerative  changes of the spine. No acute osseous pathology. Review of the MIP images confirms the above findings. CT ABDOMEN and PELVIS FINDINGS No intra-abdominal free air or free fluid. Hepatobiliary: Similar appearance of faint hypodense lesions in the left lobe of the liver not characterized on this CT. This can be better evaluated with ultrasound or MRI on a nonemergent/outpatient basis. No biliary dilatation. No calcified gallstone. Pancreas: The pancreas is suboptimally evaluated due to respiratory motion but grossly unremarkable. Spleen: Normal in size without focal abnormality. Adrenals/Urinary Tract: The adrenal glands unremarkable. The kidneys, and urinary bladder unremarkable. Stomach/Bowel: There is no bowel obstruction or active inflammation. The appendix is normal. Vascular/Lymphatic: Mild aortoiliac atherosclerotic disease. The IVC is unremarkable. No portal venous gas. Indeterminate 1.8 x 2.9 cm infiltrative tissue in the portacaval region (20/4) of indeterminate etiology but suspicious for adenopathy or retroperitoneal fibrosis or a neoplastic process. This can be better evaluated with MRI without and with contrast on a nonemergent  basis. Reproductive: The uterus is grossly unremarkable. No suspicious adnexal masses. Other: None Musculoskeletal: Degenerative changes of the spine and hips. No acute osseous pathology. IMPRESSION: 1. No CT evidence of central pulmonary artery embolus. 2. Small right pleural effusion with subsegmental right lung base atelectasis. Pneumonia is not excluded. 3. Mild thickened appearance of the esophagus may be related to underdistention. Esophagitis or underlying esophageal mass is not excluded. Endoscopy may provide better evaluation. 4. No bowel obstruction. Normal appendix. 5. Indeterminate 1.8 x 2.9 cm infiltrative tissue in the portacaval region of indeterminate etiology but suspicious for adenopathy or retroperitoneal fibrosis or a neoplastic process. This can be better evaluated with MRI without and with contrast on a nonemergent basis. 6.  Aortic Atherosclerosis (ICD10-I70.0). Electronically Signed   By: Vanetta Chou M.D.   On: 09/15/2024 20:54   CT Head Wo Contrast Result Date: 09/15/2024 CLINICAL DATA:  Poly trauma fall dizzy EXAM: CT HEAD WITHOUT CONTRAST CT CERVICAL SPINE WITHOUT CONTRAST TECHNIQUE: Multidetector CT imaging of the head and cervical spine was performed following the standard protocol without intravenous contrast. Multiplanar CT image reconstructions of the cervical spine were also generated. RADIATION DOSE REDUCTION: This exam was performed according to the departmental dose-optimization program which includes automated exposure control, adjustment of the mA and/or kV according to patient size and/or use of iterative reconstruction technique. COMPARISON:  Radiograph 08/18/2018, CT brain 09/13/2024 FINDINGS: CT HEAD FINDINGS Brain: No acute territorial infarction, hemorrhage or intracranial mass. Mild atrophy. Stable ventricle size. Partially empty sella Vascular: No hyperdense vessels.  Carotid vascular calcification. Skull: Normal. Negative for fracture or focal lesion.  Sinuses/Orbits: No acute finding. Other: None CT CERVICAL SPINE FINDINGS Alignment: Straightening of the cervical spine. No subluxation. Facet alignment is normal Skull base and vertebrae: No acute fracture. No primary bone lesion or focal pathologic process. Soft tissues and spinal canal: No prevertebral fluid or swelling. No visible canal hematoma. Disc levels: Moderate severe diffuse disc space narrowing C3 through T1. No high-grade canal stenosis Upper chest: Negative. Other: None IMPRESSION: 1. No CT evidence for acute intracranial abnormality. Mild atrophy. 2. Straightening of the cervical spine with degenerative changes. No acute osseous abnormality. Electronically Signed   By: Luke Bun M.D.   On: 09/15/2024 17:19   CT Cervical Spine Wo Contrast Result Date: 09/15/2024 CLINICAL DATA:  Poly trauma fall dizzy EXAM: CT HEAD WITHOUT CONTRAST CT CERVICAL SPINE WITHOUT CONTRAST TECHNIQUE: Multidetector CT imaging of the head and cervical spine was performed following the standard protocol without intravenous contrast. Multiplanar CT image  reconstructions of the cervical spine were also generated. RADIATION DOSE REDUCTION: This exam was performed according to the departmental dose-optimization program which includes automated exposure control, adjustment of the mA and/or kV according to patient size and/or use of iterative reconstruction technique. COMPARISON:  Radiograph 08/18/2018, CT brain 09/13/2024 FINDINGS: CT HEAD FINDINGS Brain: No acute territorial infarction, hemorrhage or intracranial mass. Mild atrophy. Stable ventricle size. Partially empty sella Vascular: No hyperdense vessels.  Carotid vascular calcification. Skull: Normal. Negative for fracture or focal lesion. Sinuses/Orbits: No acute finding. Other: None CT CERVICAL SPINE FINDINGS Alignment: Straightening of the cervical spine. No subluxation. Facet alignment is normal Skull base and vertebrae: No acute fracture. No primary bone lesion or  focal pathologic process. Soft tissues and spinal canal: No prevertebral fluid or swelling. No visible canal hematoma. Disc levels: Moderate severe diffuse disc space narrowing C3 through T1. No high-grade canal stenosis Upper chest: Negative. Other: None IMPRESSION: 1. No CT evidence for acute intracranial abnormality. Mild atrophy. 2. Straightening of the cervical spine with degenerative changes. No acute osseous abnormality. Electronically Signed   By: Luke Bun M.D.   On: 09/15/2024 17:19   DG Chest Portable 1 View Result Date: 09/15/2024 EXAM: 1 VIEW(S) XRAY OF THE CHEST 09/15/2024 03:23:07 PM COMPARISON: 07/21/2024 CLINICAL HISTORY: fever FINDINGS: LUNGS AND PLEURA: Low lung volumes. No focal pulmonary opacity. No pulmonary edema. No pleural effusion. No pneumothorax. HEART AND MEDIASTINUM: No acute abnormality of the cardiac and mediastinal silhouettes. BONES AND SOFT TISSUES: Bilateral shoulder degenerative changes. Thoracic spondylosis. IMPRESSION: 1. No acute cardiopulmonary process. 2. Low lung volumes. Electronically signed by: Waddell Calk MD 09/15/2024 03:51 PM EDT RP Workstation: [id number]   CT HEAD WO CONTRAST ( ) Result Date: 09/13/2024 EXAM: CT HEAD WITHOUT CONTRAST 09/13/2024 01:19:00 PM TECHNIQUE: CT of the head was performed without the administration of intravenous contrast. Automated exposure control, iterative reconstruction, and/or weight based adjustment of the mA/kV was utilized to reduce the radiation dose to as low as reasonably achievable. COMPARISON: None available. CLINICAL HISTORY: Pt fell 1 day ago dizziness ; No hx of strokes or seizures. FINDINGS: BRAIN AND VENTRICLES: No acute hemorrhage. No evidence of acute infarct. Mild cerebral volume loss with ex vacuo ventricular dilatation. Partial empty sella. No extra-axial collection. No mass effect or midline shift. ORBITS: Status post bilateral lens replacements. SINUSES: No acute abnormality. SOFT TISSUES AND SKULL:  No acute soft tissue abnormality. Hyperostosis frontalis. No skull fracture. IMPRESSION: 1. No acute intracranial abnormality related to the reported fall and dizziness. 2. Mild cerebral volume loss with ex vacuo ventricular dilatation and partial empty sella. Electronically signed by: Franky Stanford MD 09/13/2024 01:33 PM EDT RP Workstation: [id number]     This document was completed utilizing speech recognition software. Grammatical errors, random word insertions, pronoun errors, and incomplete sentences are an occasional consequence of this system due to software limitations, ambient noise, and hardware issues. Any formal questions or concerns about the content, text or information contained within the body of this dictation should be directly addressed to the provider for clarification.

## 2024-09-16 NOTE — Progress Notes (Signed)
 Pharmacy Antibiotic Note  Erica [name] is a 78 y.o. female admitted on 09/15/2024 with fever of unknown origin.  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  Patient received Cefepime 2g IV and Vancomycin 1500mg  (1g + 500mg ) IV in the ED.  Pharmacy has been consulted for Vancomycin and Cefepime dosing.  Plan: Vancomycin 750 mg IV Q 24 hrs. Goal AUC 400-550.  Expected AUC: 546  SCr used: 0.8 (rounded up from 0.67) Cefepime 2g IV q12h Follow renal function F/u culture results & sensitivities  Height: 4' 11 (149.9 cm) Weight: 72.8 kg (160 lb 7.9 oz) IBW/kg (Calculated) : 43.2  Temp (24hrs), Avg:100.3 F (37.9 C), Min:99.1 F (37.3 C), Max:102.2 F (39 C)  Recent Labs  Lab 09/15/24 1426 09/15/24 1640 09/15/24 1848 09/15/24 1849 09/16/24 0135  WBC 7.5  --   --  7.5 7.7  CREATININE 0.67  --   --   --   --   LATICACIDVEN  --  2.2* 2.1*  --   --     Estimated Creatinine Clearance: 51.1 mL/min (by C-G formula based on SCr of 0.67 mg/dL).    Allergies  Allergen Reactions   Penicillins Hives    Has patient had a PCN reaction causing immediate rash, facial/tongue/throat swelling, SOB or lightheadedness with hypotension: Yes Has patient had a PCN reaction causing severe rash involving mucus membranes or skin necrosis: Yes Has patient had a PCN reaction that required hospitalization No Has patient had a PCN reaction occurring within the last 10 years: No If all of the above answers are NO, then may proceed with Cephalosporin use.    Sulfa Antibiotics Hives    Antimicrobials this admission: 10/23 Cefepime >>   10/23 Vancomycin >>    Dose adjustments this admission:    Microbiology results: 10/23 BCx:      Thank you for allowing pharmacy to be a part of this patient's care.  Arvin Gauss, PharmD 09/16/2024 4:58 AM

## 2024-09-16 NOTE — ED Provider Notes (Signed)
 I was asked to evaluate her because she is becoming restless. Patient is being admitted for acute anemia as well as SIRS. Patient is awake and alert, appears mildly anxious.  She is able to answer questions and follow commands.  She has no meningeal signs.  She has no pain complaints.  She reports she just wants to sit up in a bed. Nurse will attempt to get her a recliner chair.  She is on anxiety medicines but is unclear which one.  Will give her one-time dose of Ativan.  Will also recheck CBC   Midge Golas, MD 09/16/24 [phone]

## 2024-09-16 NOTE — ED Notes (Signed)
 Pt is sitting up in recliner eating.  Ginger ale given.

## 2024-09-16 NOTE — ED Notes (Signed)
 CRITICAL VALUE STICKER  CRITICAL VALUE: Hemoglobin   RECEIVER (on-site recipient of call): Sanda Dejoy, Leeroy Crumbly   DATE & TIME NOTIFIED: 2:22 AM  MD NOTIFIED: Dr. CHARM Kin  TIME OF NOTIFICATION: 2:23 AM

## 2024-09-16 NOTE — ED Provider Notes (Signed)
 Due to prolonged hold times, as well as acute anemia, patient would benefit from ER to ER transfer to [facility].  She may end up needing blood transfusion which we are unable to provide at this hospital  Endorsed to Dr. Theadore at [facility], will go ER to ER transfer   Midge Golas, MD 09/16/24 [phone]

## 2024-09-16 NOTE — H&P (Addendum)
 History and Physical    Erica [name] DOB: [birth year] DOA: 09/15/2024  PCP: Arloa Elsie SAUNDERS, MD   Patient coming from: Home   Chief Complaint:  Chief Complaint  Patient presents with   Dizziness   ED TRIAGE note:  Pt via pov from home with continued dizziness. She fell on Sunday and the dizziness started after that. Pt accompanied by her niece, who reports that pt has seen pcp twice - Tuesday and WEdnesday and had head ct on Tuesday. Pt reports she is feeling worse, not better. Pt has been eating a little, drinking a little. Pt a&o x4; nad noted.       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 intake, and fall.    At presentation to ED patient found febrile temperature 100.2 F, and tachypneic 23.  Blood pressure within good range.  O2 sat 100% room air.  Labs showing no leukocytosis, hemoglobin 6.3 (previously 11.6 in December 2024) and normal platelet count. CMP showing low sodium 132 and low chloride 97. Respiratory panel negative for COVID RSV flu. Elevated lactic acid 2.1 and 2.2. FOBT negative. UA unremarkable.  CT head no acute intracranial abnormality. CT cervical spine no abnormality.  Degenerative changes.  CTA and CT abdomen pelvis chest no evidence of pulmonary embolism.  Small right pleural effusion with subsegmental right lung base atelectasis. Pneumonia is not excluded. 3. Mild thickened appearance of the esophagus may be related to underdistention. Esophagitis or underlying esophageal mass is not excluded. Endoscopy may provide better evaluation. 4. No bowel obstruction. Normal appendix. 5. Indeterminate 1.8 x 2.9 cm infiltrative tissue in the portacaval region of indeterminate etiology but  suspicious for adenopathy or retroperitoneal fibrosis or a neoplastic process. This can be better evaluated with MRI without and with contrast on a nonemergent basis. 6.  Aortic Atherosclerosis (ICD10-I70.0).   In the ED patient received vancomycin and cefepime for sepsis.  Patient has been transferred [facility] to [facility] ED for management of symptomatic anemia and sepsis/fever of unknown origin.  During my evaluation at the bedside patient seemed confused she is alert however not completely oriented.  Patient keeps repeating same sentence again and again.  Unable to obtain any history from the patient.  Eventually called patient's niece Erica [name] [phone]) who stated that for last few days patient seems confused having poor appetite, generalized weakness and fatigue.  She did not notice patient having any cough, fever, chill, nausea, vomiting, diarrhea, blood in the stool, vomiting of blood, or I and UTI symptoms. Patient is stated that she can verbally give consent over phone for blood transfusion.    Significant labs in the ED: Lab Orders         Resp panel by RT-PCR (RSV, Flu A&B, Covid) Anterior Nasal Swab         Blood culture (routine x 2)         Comprehensive metabolic panel         CBC         Urinalysis, Routine w reflex microscopic -Urine, Unspecified Source         Occult blood card to lab, stool         Vitamin B12         Folate         Iron  and TIBC         Ferritin         Reticulocytes         Protime-INR         Lactic acid, plasma         CBC         CBC         CBC         Lactic acid, plasma         Comprehensive metabolic panel         CBC         Comprehensive metabolic panel         CBC         Vitamin B12         Folate         Iron and TIBC         Ferritin         Reticulocytes         Ammonia         TSH         CBG monitoring, ED       Review of Systems:  Review of Systems  Unable to perform ROS: Mental status  change    Past Medical History:  Diagnosis Date   Bilateral swelling of feet    Cancer (HCC)    Cataract    Diabetes mellitus without complication (HCC)    diet controlled   Eczema    Elevated cholesterol    GERD (gastroesophageal reflux disease)    Glaucoma    Hypertension    Mitral valve prolapse    Simple endometrial hyperplasia without atypia 07/2016   In endometrial polyp with surrounding endometrium inactive recommend follow up ultrasound for endometrial echo 1 year   Spondylisthesis     Past Surgical History:  Procedure Laterality Date   CESAREAN SECTION     DILATATION & CURETTAGE/HYSTEROSCOPY WITH MYOSURE N/A 08/05/2016   Procedure: DILATATION & CURETTAGE/HYSTEROSCOPY WITH MYOSURE;  Surgeon: Evalene SHAUNNA Organ, MD;  Location: [facility];  Service: Gynecology;  Laterality: N/A;   EXCISION OF SKIN TAG Left 08/05/2016   Procedure: EXCISION OF SKIN TAG;  Surgeon: Evalene SHAUNNA Organ, MD;  Location: [facility];  Service: Gynecology;  Laterality: Left;   MOUTH SURGERY     spinal tap     THORACENTESIS N/A 11/02/2023   Procedure: THORACENTESIS;  Surgeon: Annella Donnice SAUNDERS, MD;  Location: [facility];  Service: Pulmonary;  Laterality: N/A;     reports that she has never smoked. She has never used smokeless tobacco. She reports that she does not drink alcohol and does not use drugs.  Allergies  Allergen Reactions   Penicillins Hives    Has patient had a PCN reaction causing immediate rash, facial/tongue/throat swelling, SOB or lightheadedness with hypotension: Yes Has patient had a PCN reaction causing severe rash involving mucus membranes or skin necrosis: Yes Has patient had a PCN reaction that required hospitalization No Has patient had a PCN reaction occurring within the last 10 years: No If all of the above answers are NO, then may proceed with Cephalosporin use.    Sulfa Antibiotics Hives    Family History  Problem Relation Age of Onset   Heart disease Mother    Heart  disease Father    Breast cancer Sister 63    Prior to Admission medications   Medication Sig Start Date End Date Taking? Authorizing Provider  allopurinol  (ZYLOPRIM ) 300  MG tablet Take 1 tablet by mouth daily. 12/29/23   [provider]  aspirin  EC 81 MG tablet Take 81 mg by mouth daily. Swallow whole.    [provider]  furosemide  (LASIX ) 20 MG tablet Take 20 mg by mouth daily. 09/06/20   [provider]  gabapentin  (NEURONTIN ) 100 MG capsule Take 100 mg by mouth 3 (three) times daily. 10/23/20   [provider]  latanoprost  (XALATAN ) 0.005 % ophthalmic solution Place 1 drop into both eyes at bedtime.     [provider]  loratadine (CLARITIN) 10 MG tablet Take 10 mg by mouth daily as needed for allergies.    [provider]  PONATinib  HCl (ICLUSIG ) 45 MG tablet Take by mouth. 12/29/23 12/28/24  [provider]  propranolol  (INDERAL ) 10 MG tablet Take 10 mg by mouth 3 (three) times daily.    [provider]  traMADol  (ULTRAM ) 50 MG tablet Take 50 mg by mouth every 6 (six) hours as needed for moderate pain.     [provider]  triamcinolone cream (KENALOG) 0.1 % Apply 1 application topically 2 (two) times daily. Reported on 06/06/2016    [provider]     Physical Exam: Vitals:   09/16/24 0300 09/16/24 0400 09/16/24 0445 09/16/24 0449  BP: (!) 103/43 (!) 116/48 (!) 119/54   Pulse:      Resp: (!) 22 (!) 24 (!) 28   Temp:    99.1 F (37.3 C)  TempSrc:    Oral  SpO2:      Weight:      Height:        Physical Exam Vitals reviewed.  Constitutional:      General: She is not in acute distress.    Appearance: She is not ill-appearing.  HENT:     Mouth/Throat:     Mouth: Mucous membranes are moist.  Eyes:     Pupils: Pupils are equal, round, and reactive to light.  Cardiovascular:     Rate and Rhythm: Normal rate and regular rhythm.     Pulses: Normal pulses.     Heart sounds: Normal heart  sounds.  Pulmonary:     Effort: Pulmonary effort is normal.     Breath sounds: Normal breath sounds.  Abdominal:     Palpations: Abdomen is soft.  Musculoskeletal:     Cervical back: Neck supple.     Right lower leg: No edema.     Left lower leg: No edema.  Skin:    Capillary Refill: Capillary refill takes less than 2 seconds.  Neurological:     Mental Status: She is alert.     Comments: Patient is alert oriented to self  Psychiatric:     Comments: Unable to assess      Labs on Admission: I have personally reviewed following labs and imaging studies  CBC: Recent Labs  Lab 09/15/24 1426 09/15/24 1849 09/16/24 0135  WBC 7.5 7.5 7.7  HGB 7.0* 6.3* 6.2*  HCT 21.0* 18.6* 18.6*  MCV 81.4 81.6 81.6  PLT 188 277 258   Basic Metabolic Panel: Recent Labs  Lab 09/15/24 1426  NA 132*  K 4.6  CL 97*  CO2 25  GLUCOSE 157*  BUN 6*  CREATININE 0.67  CALCIUM 9.5   GFR: Estimated Creatinine Clearance: 51.1 mL/min (by C-G formula based on SCr of 0.67 mg/dL). Liver Function Tests: Recent Labs  Lab 09/15/24 1426  AST 30  ALT 5  ALKPHOS 84  BILITOT 0.5  PROT 6.5  ALBUMIN 3.6   No results for input(s): LIPASE, AMYLASE in the last 168 hours. No results for input(s): AMMONIA in the last 168 hours. Coagulation Profile: Recent Labs  Lab 09/15/24 1638  INR 1.1   Cardiac Enzymes: No results for input(s): CKTOTAL, CKMB, CKMBINDEX, TROPONINI, TROPONINIHS in the last 168 hours. BNP (last 3 results) Recent Labs    11/19/23 1438  BNP 107.4*   HbA1C: No results for input(s): HGBA1C in the last 72 hours. CBG: Recent Labs  Lab 09/15/24 1408  GLUCAP 133*   Lipid Profile: No results for input(s): CHOL, HDL, LDLCALC, TRIG, CHOLHDL, LDLDIRECT in the last 72 hours. Thyroid Function Tests: No results for input(s): TSH, T4TOTAL, FREET4, T3FREE, THYROIDAB in the last 72 hours. Anemia Panel: Recent Labs    09/15/24 1638  VITAMINB12  912  FOLATE 7.7  FERRITIN 1,294*  TIBC 239*  IRON 46  RETICCTPCT <0.4*   Urine analysis:    Component Value Date/Time   COLORURINE YELLOW 09/15/2024 1942   APPEARANCEUR CLEAR 09/15/2024 1942   LABSPEC 1.020 09/15/2024 1942   PHURINE 8.0 09/15/2024 1942   GLUCOSEU NEGATIVE 09/15/2024 1942   HGBUR NEGATIVE 09/15/2024 1942   BILIRUBINUR NEGATIVE 09/15/2024 1942   KETONESUR NEGATIVE 09/15/2024 1942   PROTEINUR 30 (A) 09/15/2024 1942   NITRITE NEGATIVE 09/15/2024 1942   LEUKOCYTESUR NEGATIVE 09/15/2024 1942    Radiological Exams on Admission: I have personally reviewed images CT Angio Chest PE W and/or Wo Contrast Result Date: 09/15/2024 CLINICAL DATA:  Dizziness and fatigue. Concern for pulmonary embolism. Abdominal pain. EXAM: CT ANGIOGRAPHY CHEST CT ABDOMEN AND PELVIS WITH CONTRAST TECHNIQUE: Multidetector CT imaging of the chest was performed using the standard protocol during bolus administration of intravenous contrast. Multiplanar CT image reconstructions and MIPs were obtained to evaluate the vascular anatomy. Multidetector CT imaging of the abdomen and pelvis was performed using the standard protocol during bolus administration of intravenous contrast. RADIATION DOSE REDUCTION: This exam was performed according to the departmental dose-optimization program which includes automated exposure control, adjustment of the mA and/or kV according to patient size and/or use of iterative reconstruction technique. CONTRAST:  OMNIPAQUE IOHEXOL 350 MG/ML SOLN COMPARISON:  CT abdomen pelvis dated 08/02/2024 FINDINGS: Evaluation of this exam is limited due to respiratory motion and streak artifact caused by patient's arms. CTA CHEST FINDINGS Cardiovascular: There is no cardiomegaly or pericardial effusion. The thoracic aorta is unremarkable. The origins of the great vessels of the aortic arch are patent. Evaluation of the pulmonary arteries is limited due to respiratory motion. No central  pulmonary artery embolus identified. Mediastinum/Nodes: No hilar or mediastinal adenopathy. Mild thickened appearance of the esophagus may be related to underdistention. Esophagitis or underlying esophageal mass is not excluded. Clinical correlation is recommended. Lungs/Pleura: Small right pleural effusion. Subsegmental right lung base atelectasis. Pneumonia is not excluded. There is no pneumothorax. The central airways are patent. Musculoskeletal: Degenerative changes of the spine. No acute osseous pathology. Review of the MIP images confirms the above findings. CT ABDOMEN and PELVIS FINDINGS No intra-abdominal free air or free fluid. Hepatobiliary: Similar appearance of faint hypodense lesions in the left lobe of the liver not characterized on this CT. This can be better evaluated with ultrasound or MRI on a nonemergent/outpatient basis. No biliary dilatation. No calcified gallstone. Pancreas: The pancreas is suboptimally evaluated due to respiratory motion but grossly unremarkable. Spleen: Normal in size without focal abnormality. Adrenals/Urinary Tract: The adrenal glands unremarkable. The kidneys, and urinary bladder unremarkable. Stomach/Bowel: There  is no bowel obstruction or active inflammation. The appendix is normal. Vascular/Lymphatic: Mild aortoiliac atherosclerotic disease. The IVC is unremarkable. No portal venous gas. Indeterminate 1.8 x 2.9 cm infiltrative tissue in the portacaval region (20/4) of indeterminate etiology but suspicious for adenopathy or retroperitoneal fibrosis or a neoplastic process. This can be better evaluated with MRI without and with contrast on a nonemergent basis. Reproductive: The uterus is grossly unremarkable. No suspicious adnexal masses. Other: None Musculoskeletal: Degenerative changes of the spine and hips. No acute osseous pathology. IMPRESSION: 1. No CT evidence of central pulmonary artery embolus. 2. Small right pleural effusion with subsegmental right lung base  atelectasis. Pneumonia is not excluded. 3. Mild thickened appearance of the esophagus may be related to underdistention. Esophagitis or underlying esophageal mass is not excluded. Endoscopy may provide better evaluation. 4. No bowel obstruction. Normal appendix. 5. Indeterminate 1.8 x 2.9 cm infiltrative tissue in the portacaval region of indeterminate etiology but suspicious for adenopathy or retroperitoneal fibrosis or a neoplastic process. This can be better evaluated with MRI without and with contrast on a nonemergent basis. 6.  Aortic Atherosclerosis (ICD10-I70.0). Electronically Signed   By: Vanetta Chou M.D.   On: 09/15/2024 20:54   CT ABDOMEN PELVIS W CONTRAST Result Date: 09/15/2024 CLINICAL DATA:  Dizziness and fatigue. Concern for pulmonary embolism. Abdominal pain. EXAM: CT ANGIOGRAPHY CHEST CT ABDOMEN AND PELVIS WITH CONTRAST TECHNIQUE: Multidetector CT imaging of the chest was performed using the standard protocol during bolus administration of intravenous contrast. Multiplanar CT image reconstructions and MIPs were obtained to evaluate the vascular anatomy. Multidetector CT imaging of the abdomen and pelvis was performed using the standard protocol during bolus administration of intravenous contrast. RADIATION DOSE REDUCTION: This exam was performed according to the departmental dose-optimization program which includes automated exposure control, adjustment of the mA and/or kV according to patient size and/or use of iterative reconstruction technique. CONTRAST:  OMNIPAQUE IOHEXOL 350 MG/ML SOLN COMPARISON:  CT abdomen pelvis dated 08/02/2024 FINDINGS: Evaluation of this exam is limited due to respiratory motion and streak artifact caused by patient's arms. CTA CHEST FINDINGS Cardiovascular: There is no cardiomegaly or pericardial effusion. The thoracic aorta is unremarkable. The origins of the great vessels of the aortic arch are patent. Evaluation of the pulmonary arteries is limited  due to respiratory motion. No central pulmonary artery embolus identified. Mediastinum/Nodes: No hilar or mediastinal adenopathy. Mild thickened appearance of the esophagus may be related to underdistention. Esophagitis or underlying esophageal mass is not excluded. Clinical correlation is recommended. Lungs/Pleura: Small right pleural effusion. Subsegmental right lung base atelectasis. Pneumonia is not excluded. There is no pneumothorax. The central airways are patent. Musculoskeletal: Degenerative changes of the spine. No acute osseous pathology. Review of the MIP images confirms the above findings. CT ABDOMEN and PELVIS FINDINGS No intra-abdominal free air or free fluid. Hepatobiliary: Similar appearance of faint hypodense lesions in the left lobe of the liver not characterized on this CT. This can be better evaluated with ultrasound or MRI on a nonemergent/outpatient basis. No biliary dilatation. No calcified gallstone. Pancreas: The pancreas is suboptimally evaluated due to respiratory motion but grossly unremarkable. Spleen: Normal in size without focal abnormality. Adrenals/Urinary Tract: The adrenal glands unremarkable. The kidneys, and urinary bladder unremarkable. Stomach/Bowel: There is no bowel obstruction or active inflammation. The appendix is normal. Vascular/Lymphatic: Mild aortoiliac atherosclerotic disease. The IVC is unremarkable. No portal venous gas. Indeterminate 1.8 x 2.9 cm infiltrative tissue in the portacaval region (20/4) of indeterminate etiology but suspicious for  adenopathy or retroperitoneal fibrosis or a neoplastic process. This can be better evaluated with MRI without and with contrast on a nonemergent basis. Reproductive: The uterus is grossly unremarkable. No suspicious adnexal masses. Other: None Musculoskeletal: Degenerative changes of the spine and hips. No acute osseous pathology. IMPRESSION: 1. No CT evidence of central pulmonary artery embolus. 2. Small right pleural effusion  with subsegmental right lung base atelectasis. Pneumonia is not excluded. 3. Mild thickened appearance of the esophagus may be related to underdistention. Esophagitis or underlying esophageal mass is not excluded. Endoscopy may provide better evaluation. 4. No bowel obstruction. Normal appendix. 5. Indeterminate 1.8 x 2.9 cm infiltrative tissue in the portacaval region of indeterminate etiology but suspicious for adenopathy or retroperitoneal fibrosis or a neoplastic process. This can be better evaluated with MRI without and with contrast on a nonemergent basis. 6.  Aortic Atherosclerosis (ICD10-I70.0). Electronically Signed   By: Vanetta Chou M.D.   On: 09/15/2024 20:54   CT Head Wo Contrast Result Date: 09/15/2024 CLINICAL DATA:  Poly trauma fall dizzy EXAM: CT HEAD WITHOUT CONTRAST CT CERVICAL SPINE WITHOUT CONTRAST TECHNIQUE: Multidetector CT imaging of the head and cervical spine was performed following the standard protocol without intravenous contrast. Multiplanar CT image reconstructions of the cervical spine were also generated. RADIATION DOSE REDUCTION: This exam was performed according to the departmental dose-optimization program which includes automated exposure control, adjustment of the mA and/or kV according to patient size and/or use of iterative reconstruction technique. COMPARISON:  Radiograph 08/18/2018, CT brain 09/13/2024 FINDINGS: CT HEAD FINDINGS Brain: No acute territorial infarction, hemorrhage or intracranial mass. Mild atrophy. Stable ventricle size. Partially empty sella Vascular: No hyperdense vessels.  Carotid vascular calcification. Skull: Normal. Negative for fracture or focal lesion. Sinuses/Orbits: No acute finding. Other: None CT CERVICAL SPINE FINDINGS Alignment: Straightening of the cervical spine. No subluxation. Facet alignment is normal Skull base and vertebrae: No acute fracture. No primary bone lesion or focal pathologic process. Soft tissues and spinal canal: No  prevertebral fluid or swelling. No visible canal hematoma. Disc levels: Moderate severe diffuse disc space narrowing C3 through T1. No high-grade canal stenosis Upper chest: Negative. Other: None IMPRESSION: 1. No CT evidence for acute intracranial abnormality. Mild atrophy. 2. Straightening of the cervical spine with degenerative changes. No acute osseous abnormality. Electronically Signed   By: Luke Bun M.D.   On: 09/15/2024 17:19   CT Cervical Spine Wo Contrast Result Date: 09/15/2024 CLINICAL DATA:  Poly trauma fall dizzy EXAM: CT HEAD WITHOUT CONTRAST CT CERVICAL SPINE WITHOUT CONTRAST TECHNIQUE: Multidetector CT imaging of the head and cervical spine was performed following the standard protocol without intravenous contrast. Multiplanar CT image reconstructions of the cervical spine were also generated. RADIATION DOSE REDUCTION: This exam was performed according to the departmental dose-optimization program which includes automated exposure control, adjustment of the mA and/or kV according to patient size and/or use of iterative reconstruction technique. COMPARISON:  Radiograph 08/18/2018, CT brain 09/13/2024 FINDINGS: CT HEAD FINDINGS Brain: No acute territorial infarction, hemorrhage or intracranial mass. Mild atrophy. Stable ventricle size. Partially empty sella Vascular: No hyperdense vessels.  Carotid vascular calcification. Skull: Normal. Negative for fracture or focal lesion. Sinuses/Orbits: No acute finding. Other: None CT CERVICAL SPINE FINDINGS Alignment: Straightening of the cervical spine. No subluxation. Facet alignment is normal Skull base and vertebrae: No acute fracture. No primary bone lesion or focal pathologic process. Soft tissues and spinal canal: No prevertebral fluid or swelling. No visible canal hematoma. Disc levels: Moderate severe diffuse disc  space narrowing C3 through T1. No high-grade canal stenosis Upper chest: Negative. Other: None IMPRESSION: 1. No CT evidence for  acute intracranial abnormality. Mild atrophy. 2. Straightening of the cervical spine with degenerative changes. No acute osseous abnormality. Electronically Signed   By: Luke Bun M.D.   On: 09/15/2024 17:19   DG Chest Portable 1 View Result Date: 09/15/2024 EXAM: 1 VIEW(S) XRAY OF THE CHEST 09/15/2024 03:23:07 PM COMPARISON: 07/21/2024 CLINICAL HISTORY: fever FINDINGS: LUNGS AND PLEURA: Low lung volumes. No focal pulmonary opacity. No pulmonary edema. No pleural effusion. No pneumothorax. HEART AND MEDIASTINUM: No acute abnormality of the cardiac and mediastinal silhouettes. BONES AND SOFT TISSUES: Bilateral shoulder degenerative changes. Thoracic spondylosis. IMPRESSION: 1. No acute cardiopulmonary process. 2. Low lung volumes. Electronically signed by: Waddell Calk MD 09/15/2024 03:51 PM EDT RP Workstation: [id number]     EKG: My personal interpretation of EKG shows: Normal sinus rhythm 197    Assessment/Plan: Principal Problem:   Symptomatic anemia Active Problems:   Chronic myeloid leukemia (HCC)   Esophagitis   Altered mental status   Fever of unknown origin   Non-insulin  dependent type 2 diabetes mellitus (HCC)   Essential hypertension   Peripheral neuropathy    Assessment and Plan: Symptomatic anemia - Presented to emergency department accompanied by patient's niece with complaining of generalized fatigue weakness poor appetite and frequent fall due to generalized weakness. -At presentation to ED patient found febrile, tachypneic and borderline hypotensive.  O2 sat 100% room air.  -Lab work showed low hemoglobin 6.2 (baseline hemoglobin around 11-13).  Normal WBC count and normal platelet count. -Negative FOBT. - CT abdomen pelvis showed concern for esophagitis/esophageal mass is not excluded.  Recommended endoscopy. -Per patient niece patient does not have any GI bleed. -[facility] transfusion.  Given patient  has confusion unable to obtain any consent from the patient.  Discussed plan for blood transfusion with patient niece over phone and she is agreeable. - Transfusing 2 units of blood.  Continue to monitor CBC and goal to keep hemoglobin above 8. - Checking anemia panel.   Fever of unknown origin Sepsis unknown source of infection -Patient is febrile, tachypneic and borderline hypotensive.  However at this time there is no specific source of infection.  Respiratory panel negative for COVID RSV flu.   CTA chest showed right lung base atelectasis unable to exclude pneumonia. -Based on patient's family member she does not have any cough at home. - UA no evidence of UTI. - At this time there is no specific source of infection has been identified. - Pending blood cultures result. -In the setting of tachypnea, hypotension, lactic acidosis and high-grade fever patient meets sepsis criteria without any source of infection yet. - Giving 2 L of LR bolus followed by continue LR at 125 cc/h - Continue to trend lactic acid level - In the ED patient received vancomycin and cefepime. - Continue IV vancomycin and cefepime with pharmacy consult. - Need to follow-up with blood culture result for antibiotic guidance.   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 -Negative FOBT. -Starting IV Protonix 40 mg daily. - Please consult GI in the daytime for further evaluation.  Altered mental status -Patient's family reported that for last 2 to 3 days patient has some ongoing confusion.  She is alert and able to recognize people however keep repeating same sentence. -CT head no acute intracranial abnormality. -Unclear etiology of  confusion at this time. 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  Currently on  chemotherapy with Dasatinib  100 mg daily.  And later on per chart review Ponatinib  has been initiated in February 2025. -patient follows oncology with [facility]. Consulted and informed on-call oncology Dr. Deanne.   Non-insulin -dependent DM type II -Unable to find any record of A1c on the chart. - Continue heart healthy carb modified diet  Essential hypertension History of chronic tachycardia Chronic diastolic heart failure -Per chart review patient is on Inderal  10 mg twice daily as needed at home and Lasix  20 mg.  In the setting of sepsis holding Imdur and Lasix .   Peripheral neuropathy -Patient is gabapentin  100 mg 3 times daily as needed for pain.  Gabapentin  in the setting of confusion   DVT prophylaxis:  SCDs and TED hose.  Deferring pharmacological DVT prophylaxis in the setting of low hemoglobin 6.3. Code Status:  Full Code Diet: Heart healthy carb modified diet Family Communication: Patient is available over phone. opportunity was given to ask question and all questions were answered satisfactorily.  Disposition Plan: Continue to monitor improvement of hemoglobin with blood transfusion. Consults: Hematology/oncology Admission status:   Inpatient, Step Down Unit  Severity of Illness: The appropriate patient status for this patient is INPATIENT. Inpatient status is judged to be reasonable and necessary in order to provide the required intensity of service to ensure the patient's safety. The patient's presenting symptoms, physical exam findings, and initial radiographic and laboratory data in the context of their chronic comorbidities is felt to place them at high risk for further clinical deterioration. Furthermore, it is not anticipated that the patient will be medically stable for discharge from the hospital within 2 midnights of admission.   * I certify that at the point of admission it is my clinical judgment that the patient will require inpatient hospital care spanning  beyond 2 midnights from the point of admission due to high intensity of service, high risk for further deterioration and high frequency of surveillance required.DEWAINE    Kaeleigh Westendorf, MD [facility]  How to contact the [facility] Attending or Consulting provider 7A - 7P or covering provider during after hours 7P -7A, for this patient.  Check the care team in [facility] and look for a) attending/consulting [facility] provider listed and b) the [facility] team listed Log into [email or web address] and use [place]'s universal password to access. If you do not have the password, please contact the hospital operator. Locate the [facility] provider you are looking for under [facility] and page to a number that you can be directly reached. If you still have difficulty reaching the provider, please page the [facility] (Director on Call) for the Hospitalists listed on amion for assistance.  09/16/2024, 5:42 AM

## 2024-09-16 NOTE — Progress Notes (Signed)
 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  In the emergency room she was found to be febrile with temperature 102, tachypneic.  Blood pressure was stable.  She was on room air.  WBC count was normal.  Hemoglobin 6.3 with recent hemoglobin of 11.6 in December 2024. Skeletal survey including CT head and CT cervical spine was negative. CTA of the chest, abdomen and pelvis with no evidence of PE, small pleural effusion, subsegmental right lung base atelectasis.  Esophageal thickening.  Cultures were drawn.  Started on blood transfusions.  Broad-spectrum antibiotics and admitted to the hospital.  Subjective: Patient seen in the morning rounds.  Also called and discussed with patient's niece over the phone.  Patient is very lethargic and unable to participate in any conversation.  She was in the process of receiving 2 L of Ringer lactate.  Given additional 1 L normal saline bolus.  Blood transfusion consented from niece. Assessment & Plan:   Sepsis present on admission, source unknown: Presented with fever, tachypnea and borderline hypotension.  Lactic acidosis of more than 4. COVID, RSV and flu negative. CT of the chest with possible right lung base atelectasis or pneumonia. UA was normal. Blood cultures collected. Received total 3 L of isotonic fluid. Lactic acid trending down. Continue IV vancomycin and cefepime today pending final cultures.  Acute infective encephalopathy: CT scan without any abnormality.  She does not have any focal deficits.  This is likely related to acute infection.  Will monitor.  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  Follows up with oncology at [facility].  I discussed with Oncology for blood transfusion, recommended to keep hemoglobin more than 8. FOBT negative.  No evidence of active bleeding.  No evidence of hemolysis. 2 units transfusion today, monitor closely. Oncology to give opinion regarding treatment, for any acute need for her leukemia.  Type 2 diabetes: Well-controlled.  On SSI.  Chronic diastolic dysfunction: Holding nitrates and Lasix .    DVT prophylaxis: SCDs Start: 09/16/24 0503 Place TED hose Start: 09/16/24 0503   Code Status: Full code Family Communication: Niece on the phone Disposition Plan: Status is: Inpatient Remains inpatient appropriate because: Severe significant illness,     Consultants:  Oncology  Procedures:  None  Antimicrobials:  Vancomycin and cefepime 10/24---     Objective: Vitals:   09/16/24 1111 09/16/24 1145 09/16/24 1220 09/16/24 1300  BP: (!) 115/46 (!) 117/44  (!) 112/42  Pulse: (!) 104 (!) 101 100 98  Resp: (!) 28 (!) 29 19 18   Temp:   (!) 101.3 F (38.5 C) 99.7 F (37.6 C)  TempSrc:   Oral Oral  SpO2: 94% 96% 95% 100%  Weight:      Height:        Intake/Output Summary (Last 24 hours) at 09/16/2024 1423 Last data filed at 09/16/2024 1106 Gross per 24 hour  Intake 2000 ml  Output --  Net 2000 ml   Filed Weights   09/15/24 1419  Weight: 72.8 kg    Examination:  General exam: Appears fatigued and tired.  Chronically sick looking.  Very frail. Respiratory system: Clear to auscultation. Respiratory effort normal.  No added sounds.  Cardiovascular system: S1 & S2 heard, RRR.  Gastrointestinal system: Soft.  She had slight wince on exam but no obvious tenderness.  No rigidity or guarding. Central nervous system: Sleepy.  Tired.  Unable to participate.  Moves all extremities.    Data Reviewed: I have personally reviewed following labs and imaging studies  CBC: Recent Labs  Lab 09/15/24 1426  09/15/24 1849 09/16/24 0135 09/16/24 0600  WBC 7.5 7.5 7.7 7.6  HGB 7.0* 6.3* 6.2* 6.9*  HCT 21.0* 18.6* 18.6* 21.6*  MCV 81.4 81.6 81.6 84.0  PLT 188 277 258 279   Basic Metabolic Panel: Recent Labs  Lab 09/15/24 1426 09/16/24 0600  NA 132* 134*  K 4.6 4.1  CL 97* 99  CO2 25 21*  GLUCOSE 157* 227*  BUN 6* 8  CREATININE 0.67 0.71  CALCIUM 9.5 8.8*   GFR: Estimated Creatinine Clearance: 51.1 mL/min (by C-G formula based on SCr of 0.71 mg/dL). Liver Function Tests: Recent Labs  Lab 09/15/24 1426 09/16/24 0600  AST 30 38  ALT 5 7  ALKPHOS 84 76  BILITOT 0.5 0.5  PROT 6.5 6.0*  ALBUMIN 3.6 3.3*   No results for input(s): LIPASE, AMYLASE in the last 168 hours. Recent Labs  Lab 09/16/24 0800  AMMONIA 25   Coagulation Profile: Recent Labs  Lab 09/15/24 1638  INR 1.1   Cardiac Enzymes: No results for input(s): CKTOTAL, CKMB, CKMBINDEX, TROPONINI in the last 168 hours. BNP (last 3 results) No results for input(s): PROBNP in the last 8760 hours. HbA1C: No results for input(s): HGBA1C in the last 72 hours. CBG: Recent Labs  Lab 09/15/24 1408  GLUCAP 133*   Lipid Profile: No results for input(s): CHOL, HDL, LDLCALC, TRIG, CHOLHDL, LDLDIRECT in the last 72 hours. Thyroid Function Tests: Recent Labs    09/16/24 0800  TSH 1.120   Anemia Panel: Recent Labs    09/15/24 1638 09/16/24 0800  VITAMINB12 912 1,462*  FOLATE 7.7 4.6*  FERRITIN 1,294* 1,283*  TIBC 239* 211*  IRON 46 49  RETICCTPCT <0.4* <0.4*   Sepsis Labs: Recent Labs  Lab 09/15/24 1640 09/15/24 1848 09/16/24 0600 09/16/24 1056  LATICACIDVEN 2.2* 2.1* 4.1* 2.9*    Recent Results (from the past 240 hours)  Resp panel by RT-PCR (RSV, Flu A&B, Covid) Anterior Nasal Swab     Status: None   Collection Time: 09/15/24  2:26 PM   Specimen: Anterior Nasal Swab  Result Value Ref Range Status   SARS Coronavirus 2 by RT PCR NEGATIVE NEGATIVE Final    Comment:  (NOTE) SARS-CoV-2 target nucleic acids are NOT DETECTED.  The SARS-CoV-2 RNA is generally detectable in upper respiratory specimens during the acute phase of infection. The lowest concentration of SARS-CoV-2 viral copies this assay can detect is 138 copies/mL. A negative result does not preclude SARS-Cov-2 infection and should not be used as the sole basis for treatment or other patient management decisions. A negative result may occur with  improper specimen collection/handling, submission of specimen other than nasopharyngeal swab, presence of viral mutation(s) within the areas targeted by this assay, and inadequate number of viral copies(<138 copies/mL). A negative result must be combined with clinical observations, patient history, and epidemiological information. The expected result is Negative.  Fact Sheet for Patients:  BloggerCourse.com  Fact Sheet for Healthcare Providers:  SeriousBroker.it  This test is no t yet approved or cleared by the [place]  FDA and  has been authorized for detection and/or diagnosis of SARS-CoV-2 by FDA under  an Emergency Use Authorization (EUA). This EUA will remain  in effect (meaning this test can be used) for the duration of the COVID-19 declaration under Section 564(b)(1) of the Act, 21 U.S.C.section 360bbb-3(b)(1), unless the authorization is terminated  or revoked sooner.       Influenza A by PCR NEGATIVE NEGATIVE Final   Influenza B by PCR NEGATIVE NEGATIVE Final    Comment: (NOTE) The Xpert Xpress SARS-CoV-2/FLU/RSV plus assay is intended as an aid in the diagnosis of influenza from Nasopharyngeal swab specimens and should not be used as a sole basis for treatment. Nasal washings and aspirates are unacceptable for Xpert Xpress SARS-CoV-2/FLU/RSV testing.  Fact Sheet for Patients: BloggerCourse.com  Fact Sheet for Healthcare  Providers: SeriousBroker.it  This test is not yet approved or cleared by the [place]  FDA and has been authorized for detection and/or diagnosis of SARS-CoV-2 by FDA under an Emergency Use Authorization (EUA). This EUA will remain in effect (meaning this test can be used) for the duration of the COVID-19 declaration under Section 564(b)(1) of the Act, 21 U.S.C. section 360bbb-3(b)(1), unless the authorization is terminated or revoked.     Resp Syncytial Virus by PCR NEGATIVE NEGATIVE Final    Comment: (NOTE) Fact Sheet for Patients: BloggerCourse.com  Fact Sheet for Healthcare Providers: SeriousBroker.it  This test is not yet approved or cleared by the [place]  FDA and has been authorized for detection and/or diagnosis of SARS-CoV-2 by FDA under an Emergency Use Authorization (EUA). This EUA will remain in effect (meaning this test can be used) for the duration of the COVID-19 declaration under Section 564(b)(1) of the Act, 21 U.S.C. section 360bbb-3(b)(1), unless the authorization is terminated or revoked.  Performed at [facility], [address] [place], [place] [address]   Blood culture (routine x 2)     Status: None (Preliminary result)   Collection Time: 09/15/24  4:50 PM   Specimen: BLOOD  Result Value Ref Range Status   Specimen Description   Final    BLOOD BLOOD LEFT FOREARM Performed at [facility], [address] [place], [place] [address]    Special Requests   Final    BOTTLES DRAWN AEROBIC AND ANAEROBIC Blood Culture adequate volume Performed at [facility], [address] [place], [place] [address]    Culture   Final    NO GROWTH < 12 HOURS Performed at [facility], [address], [place], [place] [address]    Report Status PENDING  Incomplete         Radiology Studies: CT Angio Chest PE W  and/or Wo Contrast Result Date: 09/15/2024 CLINICAL DATA:  Dizziness and fatigue. Concern for pulmonary embolism. Abdominal pain. EXAM: CT ANGIOGRAPHY CHEST CT ABDOMEN AND PELVIS WITH CONTRAST TECHNIQUE: Multidetector CT imaging of the chest was performed using the standard protocol during bolus administration of intravenous contrast. Multiplanar CT image reconstructions and MIPs were obtained to evaluate the vascular anatomy. Multidetector CT imaging of the abdomen and pelvis was performed using the standard protocol during bolus administration of intravenous contrast. RADIATION DOSE REDUCTION: This exam was performed according to the departmental dose-optimization program which includes automated exposure control, adjustment of the mA and/or kV according to patient size and/or use of iterative reconstruction technique. CONTRAST:  OMNIPAQUE IOHEXOL 350 MG/ML SOLN COMPARISON:  CT abdomen pelvis dated 08/02/2024 FINDINGS: Evaluation of this exam is limited due to respiratory motion and streak artifact caused by patient's arms. CTA CHEST FINDINGS Cardiovascular: There is  no cardiomegaly or pericardial effusion. The thoracic aorta is unremarkable. The origins of the great vessels of the aortic arch are patent. Evaluation of the pulmonary arteries is limited due to respiratory motion. No central pulmonary artery embolus identified. Mediastinum/Nodes: No hilar or mediastinal adenopathy. Mild thickened appearance of the esophagus may be related to underdistention. Esophagitis or underlying esophageal mass is not excluded. Clinical correlation is recommended. Lungs/Pleura: Small right pleural effusion. Subsegmental right lung base atelectasis. Pneumonia is not excluded. There is no pneumothorax. The central airways are patent. Musculoskeletal: Degenerative changes of the spine. No acute osseous pathology. Review of the MIP images confirms the above findings. CT ABDOMEN and PELVIS FINDINGS No intra-abdominal free  air or free fluid. Hepatobiliary: Similar appearance of faint hypodense lesions in the left lobe of the liver not characterized on this CT. This can be better evaluated with ultrasound or MRI on a nonemergent/outpatient basis. No biliary dilatation. No calcified gallstone. Pancreas: The pancreas is suboptimally evaluated due to respiratory motion but grossly unremarkable. Spleen: Normal in size without focal abnormality. Adrenals/Urinary Tract: The adrenal glands unremarkable. The kidneys, and urinary bladder unremarkable. Stomach/Bowel: There is no bowel obstruction or active inflammation. The appendix is normal. Vascular/Lymphatic: Mild aortoiliac atherosclerotic disease. The IVC is unremarkable. No portal venous gas. Indeterminate 1.8 x 2.9 cm infiltrative tissue in the portacaval region (20/4) of indeterminate etiology but suspicious for adenopathy or retroperitoneal fibrosis or a neoplastic process. This can be better evaluated with MRI without and with contrast on a nonemergent basis. Reproductive: The uterus is grossly unremarkable. No suspicious adnexal masses. Other: None Musculoskeletal: Degenerative changes of the spine and hips. No acute osseous pathology. IMPRESSION: 1. No CT evidence of central pulmonary artery embolus. 2. Small right pleural effusion with subsegmental right lung base atelectasis. Pneumonia is not excluded. 3. Mild thickened appearance of the esophagus may be related to underdistention. Esophagitis or underlying esophageal mass is not excluded. Endoscopy may provide better evaluation. 4. No bowel obstruction. Normal appendix. 5. Indeterminate 1.8 x 2.9 cm infiltrative tissue in the portacaval region of indeterminate etiology but suspicious for adenopathy or retroperitoneal fibrosis or a neoplastic process. This can be better evaluated with MRI without and with contrast on a nonemergent basis. 6.  Aortic Atherosclerosis (ICD10-I70.0). Electronically Signed   By: Vanetta Chou M.D.    On: 09/15/2024 20:54   CT ABDOMEN PELVIS W CONTRAST Result Date: 09/15/2024 CLINICAL DATA:  Dizziness and fatigue. Concern for pulmonary embolism. Abdominal pain. EXAM: CT ANGIOGRAPHY CHEST CT ABDOMEN AND PELVIS WITH CONTRAST TECHNIQUE: Multidetector CT imaging of the chest was performed using the standard protocol during bolus administration of intravenous contrast. Multiplanar CT image reconstructions and MIPs were obtained to evaluate the vascular anatomy. Multidetector CT imaging of the abdomen and pelvis was performed using the standard protocol during bolus administration of intravenous contrast. RADIATION DOSE REDUCTION: This exam was performed according to the departmental dose-optimization program which includes automated exposure control, adjustment of the mA and/or kV according to patient size and/or use of iterative reconstruction technique. CONTRAST:  OMNIPAQUE IOHEXOL 350 MG/ML SOLN COMPARISON:  CT abdomen pelvis dated 08/02/2024 FINDINGS: Evaluation of this exam is limited due to respiratory motion and streak artifact caused by patient's arms. CTA CHEST FINDINGS Cardiovascular: There is no cardiomegaly or pericardial effusion. The thoracic aorta is unremarkable. The origins of the great vessels of the aortic arch are patent. Evaluation of the pulmonary arteries is limited due to respiratory motion. No central pulmonary artery embolus identified. Mediastinum/Nodes: No hilar  or mediastinal adenopathy. Mild thickened appearance of the esophagus may be related to underdistention. Esophagitis or underlying esophageal mass is not excluded. Clinical correlation is recommended. Lungs/Pleura: Small right pleural effusion. Subsegmental right lung base atelectasis. Pneumonia is not excluded. There is no pneumothorax. The central airways are patent. Musculoskeletal: Degenerative changes of the spine. No acute osseous pathology. Review of the MIP images confirms the above findings. CT ABDOMEN and PELVIS  FINDINGS No intra-abdominal free air or free fluid. Hepatobiliary: Similar appearance of faint hypodense lesions in the left lobe of the liver not characterized on this CT. This can be better evaluated with ultrasound or MRI on a nonemergent/outpatient basis. No biliary dilatation. No calcified gallstone. Pancreas: The pancreas is suboptimally evaluated due to respiratory motion but grossly unremarkable. Spleen: Normal in size without focal abnormality. Adrenals/Urinary Tract: The adrenal glands unremarkable. The kidneys, and urinary bladder unremarkable. Stomach/Bowel: There is no bowel obstruction or active inflammation. The appendix is normal. Vascular/Lymphatic: Mild aortoiliac atherosclerotic disease. The IVC is unremarkable. No portal venous gas. Indeterminate 1.8 x 2.9 cm infiltrative tissue in the portacaval region (20/4) of indeterminate etiology but suspicious for adenopathy or retroperitoneal fibrosis or a neoplastic process. This can be better evaluated with MRI without and with contrast on a nonemergent basis. Reproductive: The uterus is grossly unremarkable. No suspicious adnexal masses. Other: None Musculoskeletal: Degenerative changes of the spine and hips. No acute osseous pathology. IMPRESSION: 1. No CT evidence of central pulmonary artery embolus. 2. Small right pleural effusion with subsegmental right lung base atelectasis. Pneumonia is not excluded. 3. Mild thickened appearance of the esophagus may be related to underdistention. Esophagitis or underlying esophageal mass is not excluded. Endoscopy may provide better evaluation. 4. No bowel obstruction. Normal appendix. 5. Indeterminate 1.8 x 2.9 cm infiltrative tissue in the portacaval region of indeterminate etiology but suspicious for adenopathy or retroperitoneal fibrosis or a neoplastic process. This can be better evaluated with MRI without and with contrast on a nonemergent basis. 6.  Aortic Atherosclerosis (ICD10-I70.0). Electronically  Signed   By: Vanetta Chou M.D.   On: 09/15/2024 20:54   CT Head Wo Contrast Result Date: 09/15/2024 CLINICAL DATA:  Poly trauma fall dizzy EXAM: CT HEAD WITHOUT CONTRAST CT CERVICAL SPINE WITHOUT CONTRAST TECHNIQUE: Multidetector CT imaging of the head and cervical spine was performed following the standard protocol without intravenous contrast. Multiplanar CT image reconstructions of the cervical spine were also generated. RADIATION DOSE REDUCTION: This exam was performed according to the departmental dose-optimization program which includes automated exposure control, adjustment of the mA and/or kV according to patient size and/or use of iterative reconstruction technique. COMPARISON:  Radiograph 08/18/2018, CT brain 09/13/2024 FINDINGS: CT HEAD FINDINGS Brain: No acute territorial infarction, hemorrhage or intracranial mass. Mild atrophy. Stable ventricle size. Partially empty sella Vascular: No hyperdense vessels.  Carotid vascular calcification. Skull: Normal. Negative for fracture or focal lesion. Sinuses/Orbits: No acute finding. Other: None CT CERVICAL SPINE FINDINGS Alignment: Straightening of the cervical spine. No subluxation. Facet alignment is normal Skull base and vertebrae: No acute fracture. No primary bone lesion or focal pathologic process. Soft tissues and spinal canal: No prevertebral fluid or swelling. No visible canal hematoma. Disc levels: Moderate severe diffuse disc space narrowing C3 through T1. No high-grade canal stenosis Upper chest: Negative. Other: None IMPRESSION: 1. No CT evidence for acute intracranial abnormality. Mild atrophy. 2. Straightening of the cervical spine with degenerative changes. No acute osseous abnormality. Electronically Signed   By: Luke Bun M.D.   On:  09/15/2024 17:19   CT Cervical Spine Wo Contrast Result Date: 09/15/2024 CLINICAL DATA:  Poly trauma fall dizzy EXAM: CT HEAD WITHOUT CONTRAST CT CERVICAL SPINE WITHOUT CONTRAST TECHNIQUE:  Multidetector CT imaging of the head and cervical spine was performed following the standard protocol without intravenous contrast. Multiplanar CT image reconstructions of the cervical spine were also generated. RADIATION DOSE REDUCTION: This exam was performed according to the departmental dose-optimization program which includes automated exposure control, adjustment of the mA and/or kV according to patient size and/or use of iterative reconstruction technique. COMPARISON:  Radiograph 08/18/2018, CT brain 09/13/2024 FINDINGS: CT HEAD FINDINGS Brain: No acute territorial infarction, hemorrhage or intracranial mass. Mild atrophy. Stable ventricle size. Partially empty sella Vascular: No hyperdense vessels.  Carotid vascular calcification. Skull: Normal. Negative for fracture or focal lesion. Sinuses/Orbits: No acute finding. Other: None CT CERVICAL SPINE FINDINGS Alignment: Straightening of the cervical spine. No subluxation. Facet alignment is normal Skull base and vertebrae: No acute fracture. No primary bone lesion or focal pathologic process. Soft tissues and spinal canal: No prevertebral fluid or swelling. No visible canal hematoma. Disc levels: Moderate severe diffuse disc space narrowing C3 through T1. No high-grade canal stenosis Upper chest: Negative. Other: None IMPRESSION: 1. No CT evidence for acute intracranial abnormality. Mild atrophy. 2. Straightening of the cervical spine with degenerative changes. No acute osseous abnormality. Electronically Signed   By: Luke Bun M.D.   On: 09/15/2024 17:19   DG Chest Portable 1 View Result Date: 09/15/2024 EXAM: 1 VIEW(S) XRAY OF THE CHEST 09/15/2024 03:23:07 PM COMPARISON: 07/21/2024 CLINICAL HISTORY: fever FINDINGS: LUNGS AND PLEURA: Low lung volumes. No focal pulmonary opacity. No pulmonary edema. No pleural effusion. No pneumothorax. HEART AND MEDIASTINUM: No acute abnormality of the cardiac and mediastinal silhouettes. BONES AND SOFT TISSUES:  Bilateral shoulder degenerative changes. Thoracic spondylosis. IMPRESSION: 1. No acute cardiopulmonary process. 2. Low lung volumes. Electronically signed by: Waddell Calk MD 09/15/2024 03:51 PM EDT RP Workstation: [id number]        Scheduled Meds:  sodium chloride    Intravenous Once   feeding supplement  237 mL Oral BID BM   pantoprazole (PROTONIX) IV  40 mg Intravenous Q24H   Continuous Infusions:  sodium chloride  150 mL/hr at 09/16/24 1127   ceFEPime (MAXIPIME) IV 2 g (09/16/24 0604)   vancomycin       LOS: 0 days    Time spent: 55 minutes.  Same-day admit.  No charge visit.    Renato Applebaum, MD [facility]

## 2024-09-17 DIAGNOSIS — D649 Anemia, unspecified: Secondary | ICD-10-CM | POA: Diagnosis not present

## 2024-09-17 LAB — CBC WITH DIFFERENTIAL/PLATELET
Basophils Absolute: 0.5 K/uL — ABNORMAL HIGH (ref 0.0–0.1)
Basophils Relative: 8 %
Eosinophils Absolute: 0 K/uL (ref 0.0–0.5)
Eosinophils Relative: 0 %
HCT: 28.1 % — ABNORMAL LOW (ref 36.0–46.0)
Hemoglobin: 9.5 g/dL — ABNORMAL LOW (ref 12.0–15.0)
Lymphocytes Relative: 12 %
Lymphs Abs: 0.8 K/uL (ref 0.7–4.0)
MCH: 29.3 pg (ref 26.0–34.0)
MCHC: 33.8 g/dL (ref 30.0–36.0)
MCV: 86.7 fL (ref 80.0–100.0)
Monocytes Absolute: 0.1 K/uL (ref 0.1–1.0)
Monocytes Relative: 2 %
Neutro Abs: 5.3 K/uL (ref 1.7–7.7)
Neutrophils Relative %: 78 %
Platelets: 189 K/uL (ref 150–400)
RBC: 3.24 MIL/uL — ABNORMAL LOW (ref 3.87–5.11)
RDW: 17 % — ABNORMAL HIGH (ref 11.5–15.5)
WBC: 6.8 K/uL (ref 4.0–10.5)
nRBC: 0 % (ref 0.0–0.2)

## 2024-09-17 LAB — COMPREHENSIVE METABOLIC PANEL WITH GFR
ALT: 5 U/L (ref 0–44)
AST: 33 U/L (ref 15–41)
Albumin: 2.9 g/dL — ABNORMAL LOW (ref 3.5–5.0)
Alkaline Phosphatase: 65 U/L (ref 38–126)
Anion gap: 11 (ref 5–15)
BUN: 8 mg/dL (ref 8–23)
CO2: 20 mmol/L — ABNORMAL LOW (ref 22–32)
Calcium: 7.9 mg/dL — ABNORMAL LOW (ref 8.9–10.3)
Chloride: 104 mmol/L (ref 98–111)
Creatinine, Ser: 0.62 mg/dL (ref 0.44–1.00)
GFR, Estimated: >60 mL/min (ref >60)
Glucose, Bld: 111 mg/dL — ABNORMAL HIGH (ref 70–99)
Potassium: 3.5 mmol/L (ref 3.5–5.1)
Sodium: 135 mmol/L (ref 135–145)
Total Bilirubin: 0.5 mg/dL (ref 0.0–1.2)
Total Protein: 5.4 g/dL — ABNORMAL LOW (ref 6.5–8.1)

## 2024-09-17 LAB — HEMOGLOBIN AND HEMATOCRIT, BLOOD
HCT: 26.7 % — ABNORMAL LOW (ref 36.0–46.0)
Hemoglobin: 9 g/dL — ABNORMAL LOW (ref 12.0–15.0)

## 2024-09-17 LAB — DIRECT ANTIGLOBULIN TEST (NOT AT ARMC)
DAT, IgG: NEGATIVE
DAT, complement: NEGATIVE

## 2024-09-17 MED ORDER — FOLIC ACID 1 MG PO TABS
2.0000 mg | ORAL_TABLET | Freq: Every day | ORAL | Status: DC
  Administered 2024-09-17 – 2024-09-22 (×6): 2 mg via ORAL
  Filled 2024-09-17 (×6): qty 2

## 2024-09-17 NOTE — Progress Notes (Signed)
 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  In the emergency room she was found to be febrile with temperature 102, tachypneic.  Blood pressure was stable.  She was on room air.  WBC count was normal.  Hemoglobin 6.3 with recent hemoglobin of 11.6 in December 2024. Skeletal survey including CT head and CT cervical spine was negative. CTA of the chest, abdomen and pelvis with no evidence of PE, small pleural effusion, subsegmental right lung base atelectasis.  Esophageal thickening.  Cultures were drawn.  Started on blood transfusions.  Broad-spectrum antibiotics and admitted to the hospital.  Subjective: Patient seen and examined.  Alert awake and oriented today.  She is mostly interactive.  Feels weak.  Appetite is poor. Still having intermittent fever.  Tmax 102.2.  Patient tells me that she has not told anybody about her cancer, accidentally her niece Rosaline only knows what is going on with her.  She does not want even her son to know about it.   Assessment & Plan:   Sepsis present on admission, source unknown: Presented with fever, tachypnea and borderline hypotension.  Lactic acidosis of more than 4. COVID, RSV and flu negative. CT of the chest with possible right lung base atelectasis or pneumonia. UA was normal. Blood cultures, negative so far. Resuscitated.  Lactic acid trending down. Continue IV vancomycin and cefepime today pending final cultures.  Acute infective encephalopathy: CT scan without any abnormality.  She does not have any focal deficits.  Mental status improved.  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  Follows up with oncology at [facility]. Seen by oncologist.  Most of the hemolysis labs were negative.  Folic acid was 4.  Adding folic acid. Received 2 units of PRBC with appropriate response. FOBT negative.  No evidence of active bleeding.  No evidence of hemolysis.  Monitor.  Abnormal CT scan of the esophagus: With acute onset of anemia, may benefit with upper GI endoscopy once improved.  Will consult gastroenterology.  Type 2 diabetes: Well-controlled.  On SSI.  Chronic diastolic dysfunction: Holding nitrates and Lasix .    DVT prophylaxis: SCDs Start: 09/16/24 0503 Place TED hose Start: 09/16/24 0503   Code Status: Full code Family Communication: Niece called, unable to talk today  Disposition Plan: Status is: Inpatient Remains inpatient appropriate because: Severe significant illness,     Consultants:  Oncology  Procedures:  None  Antimicrobials:  Vancomycin and cefepime 10/24---     Objective: Vitals:   09/16/24 2026 09/17/24 0031 09/17/24 0450 09/17/24 0953  BP: 130/61 134/60 (!) 130/53 (!) 156/64  Pulse: 97 89 90 90  Resp:    18  Temp: 98.7 F (37.1 C) 98 F (36.7 C) 98.3 F (36.8 C) (!) 102.2 F (39 C)  TempSrc: Oral Oral Oral Oral  SpO2: 100% 99% 100% 96%  Weight:      Height:        Intake/Output Summary (Last 24 hours) at 09/17/2024 1059 Last data filed at 09/17/2024 0443 Gross per 24 hour  Intake 5634.92 ml  Output --  Net 5634.92 ml   Filed Weights   09/15/24 1419  Weight: 72.8 kg    Examination:  General exam: Looks fairly comfortable today.  Frail.  Chronically sick looking but not in any distress. Respiratory system: Clear to auscultation. Respiratory effort normal.  No added sounds. Cardiovascular system: S1 & S2 heard, RRR.  Gastrointestinal system: Soft.  Nontender.  No rigidity or guarding. Central nervous system: Alert and awake.  Mostly oriented.  No focal deficits.    Data Reviewed: I have  personally reviewed following labs and imaging studies  CBC: Recent Labs  Lab 09/15/24 1849 09/16/24 0135 09/16/24 0600 09/16/24 1609 09/17/24 0116 09/17/24 0814  WBC 7.5 7.7 7.6 7.2  --  6.8  NEUTROABS  --   --   --   --   --  5.3  HGB 6.3* 6.2* 6.9* 7.9* 9.0* 9.5*  HCT 18.6* 18.6* 21.6* 25.5* 26.7* 28.1*  MCV 81.6 81.6 84.0 90.7  --  86.7  PLT 277 258 279 227  --  189   Basic Metabolic Panel: Recent Labs  Lab 09/15/24 1426 09/16/24 0600 09/17/24 0620  NA 132* 134* 135  K 4.6 4.1 3.5  CL 97* 99 104  CO2 25 21* 20*  GLUCOSE 157* 227* 111*  BUN 6* 8 8  CREATININE 0.67 0.71 0.62  CALCIUM 9.5 8.8* 7.9*   GFR: Estimated Creatinine Clearance: 51.1 mL/min (by C-G formula based on SCr of 0.62 mg/dL). Liver Function Tests: Recent Labs  Lab 09/15/24 1426 09/16/24 0600 09/17/24 0620  AST 30 38 33  ALT 5 7 5   ALKPHOS 84 76 65  BILITOT 0.5 0.5 0.5  PROT 6.5 6.0* 5.4*  ALBUMIN 3.6 3.3* 2.9*   No results for input(s): LIPASE, AMYLASE in the last 168 hours. Recent Labs  Lab 09/16/24 0800  AMMONIA 25   Coagulation Profile: Recent Labs  Lab 09/15/24 1638  INR 1.1   Cardiac Enzymes: No results for input(s): CKTOTAL, CKMB, CKMBINDEX, TROPONINI in the last 168 hours. BNP (last 3 results) No results for input(s): PROBNP in the last 8760 hours. HbA1C: No results for input(s): HGBA1C in the last 72 hours. CBG: Recent Labs  Lab 09/15/24 1408  GLUCAP 133*   Lipid Profile: No results for input(s): CHOL, HDL, LDLCALC, TRIG, CHOLHDL, LDLDIRECT in the last 72 hours. Thyroid Function Tests: Recent Labs    09/16/24 0800  TSH 1.120   Anemia Panel: Recent Labs    09/15/24 1638 09/16/24 0800  VITAMINB12 912 1,462*  FOLATE 7.7 4.6*  FERRITIN 1,294* 1,283*  TIBC 239* 211*  IRON 46 49  RETICCTPCT <0.4* <0.4*   Sepsis Labs: Recent Labs  Lab 09/15/24 1640 09/15/24 1848 09/16/24 0600 09/16/24 1056  LATICACIDVEN 2.2* 2.1* 4.1* 2.9*     Recent Results (from the past 240 hours)  Resp panel by RT-PCR (RSV, Flu A&B, Covid) Anterior Nasal Swab     Status: None   Collection Time: 09/15/24  2:26 PM   Specimen: Anterior Nasal Swab  Result Value Ref Range Status   SARS Coronavirus 2 by RT PCR NEGATIVE NEGATIVE Final    Comment: (NOTE) SARS-CoV-2 target nucleic acids are NOT DETECTED.  The SARS-CoV-2 RNA is generally detectable in upper respiratory specimens during the acute phase of infection. The lowest concentration of SARS-CoV-2 viral copies this assay can detect is 138 copies/mL. A negative result does not preclude SARS-Cov-2 infection and should not be used as the sole basis for treatment or other patient management decisions. A negative result may occur with  improper specimen collection/handling, submission of specimen other than nasopharyngeal swab, presence of viral mutation(s) within the  areas targeted by this assay, and inadequate number of viral copies(<138 copies/mL). A negative result must be combined with clinical observations, patient history, and epidemiological information. The expected result is Negative.  Fact Sheet for Patients:  bloggercourse.com  Fact Sheet for Healthcare Providers:  seriousbroker.it  This test is no t yet approved or cleared by the [place]  FDA and  has been authorized for detection and/or diagnosis of SARS-CoV-2 by FDA under an Emergency Use Authorization (EUA). This EUA will remain  in effect (meaning this test can be used) for the duration of the COVID-19 declaration under Section 564(b)(1) of the Act, 21 U.S.C.section 360bbb-3(b)(1), unless the authorization is terminated  or revoked sooner.       Influenza A by PCR NEGATIVE NEGATIVE Final   Influenza B by PCR NEGATIVE NEGATIVE Final    Comment: (NOTE) The Xpert Xpress SARS-CoV-2/FLU/RSV plus assay is intended as an aid in the diagnosis of influenza from  Nasopharyngeal swab specimens and should not be used as a sole basis for treatment. Nasal washings and aspirates are unacceptable for Xpert Xpress SARS-CoV-2/FLU/RSV testing.  Fact Sheet for Patients: bloggercourse.com  Fact Sheet for Healthcare Providers: seriousbroker.it  This test is not yet approved or cleared by the [place]  FDA and has been authorized for detection and/or diagnosis of SARS-CoV-2 by FDA under an Emergency Use Authorization (EUA). This EUA will remain in effect (meaning this test can be used) for the duration of the COVID-19 declaration under Section 564(b)(1) of the Act, 21 U.S.C. section 360bbb-3(b)(1), unless the authorization is terminated or revoked.     Resp Syncytial Virus by PCR NEGATIVE NEGATIVE Final    Comment: (NOTE) Fact Sheet for Patients: bloggercourse.com  Fact Sheet for Healthcare Providers: seriousbroker.it  This test is not yet approved or cleared by the [place]  FDA and has been authorized for detection and/or diagnosis of SARS-CoV-2 by FDA under an Emergency Use Authorization (EUA). This EUA will remain in effect (meaning this test can be used) for the duration of the COVID-19 declaration under Section 564(b)(1) of the Act, 21 U.S.C. section 360bbb-3(b)(1), unless the authorization is terminated or revoked.  Performed at [facility], [address] [place], [place] [address]   Blood culture (routine x 2)     Status: None (Preliminary result)   Collection Time: 09/15/24  4:50 PM   Specimen: BLOOD  Result Value Ref Range Status   Specimen Description   Final    BLOOD BLOOD LEFT FOREARM Performed at [facility], [address] [place], [place] [address]    Special Requests   Final    BOTTLES DRAWN AEROBIC AND ANAEROBIC Blood Culture adequate volume Performed at [facility], [address], [place], [place] [address]    Culture   Final    NO GROWTH 2 DAYS Performed at [facility], [address] [place], [place] [address]    Report Status PENDING  Incomplete  Blood culture (routine x 2)     Status: None (Preliminary result)   Collection Time: 09/16/24  6:00 AM   Specimen: BLOOD  Result Value Ref Range Status   Specimen Description   Final    BLOOD LEFT ANTECUBITAL Performed at [facility], [address] [place], [place] [address]    Special Requests   Final    BOTTLES DRAWN AEROBIC AND ANAEROBIC Blood Culture results may not be optimal due to an inadequate volume of blood received in culture bottles Performed at  [facility], [address], [place], [place] [address]    Culture   Final    NO GROWTH < 24 HOURS Performed at [facility], [address] [place], [place] [address]    Report Status PENDING  Incomplete         Radiology Studies: CT Angio Chest PE W and/or Wo Contrast Result Date: 09/15/2024 CLINICAL DATA:  Dizziness and fatigue. Concern for pulmonary embolism. Abdominal pain. EXAM: CT ANGIOGRAPHY CHEST CT ABDOMEN AND PELVIS WITH CONTRAST TECHNIQUE: Multidetector CT imaging of the chest was performed using the standard protocol during bolus administration of intravenous contrast. Multiplanar CT image reconstructions and MIPs were obtained to evaluate the vascular anatomy. Multidetector CT imaging of the abdomen and pelvis was performed using the standard protocol during bolus administration of intravenous contrast. RADIATION DOSE REDUCTION: This exam was performed according to the departmental dose-optimization program which includes automated exposure control, adjustment of the mA and/or kV according to patient size and/or use of iterative reconstruction technique. CONTRAST:  OMNIPAQUE IOHEXOL 350 MG/ML SOLN COMPARISON:  CT abdomen pelvis dated 08/02/2024 FINDINGS:  Evaluation of this exam is limited due to respiratory motion and streak artifact caused by patient's arms. CTA CHEST FINDINGS Cardiovascular: There is no cardiomegaly or pericardial effusion. The thoracic aorta is unremarkable. The origins of the great vessels of the aortic arch are patent. Evaluation of the pulmonary arteries is limited due to respiratory motion. No central pulmonary artery embolus identified. Mediastinum/Nodes: No hilar or mediastinal adenopathy. Mild thickened appearance of the esophagus may be related to underdistention. Esophagitis or underlying esophageal mass is not excluded. Clinical correlation is recommended. Lungs/Pleura: Small right pleural effusion. Subsegmental right lung base atelectasis. Pneumonia is not excluded. There is no pneumothorax. The central airways are patent. Musculoskeletal: Degenerative changes of the spine. No acute osseous pathology. Review of the MIP images confirms the above findings. CT ABDOMEN and PELVIS FINDINGS No intra-abdominal free air or free fluid. Hepatobiliary: Similar appearance of faint hypodense lesions in the left lobe of the liver not characterized on this CT. This can be better evaluated with ultrasound or MRI on a nonemergent/outpatient basis. No biliary dilatation. No calcified gallstone. Pancreas: The pancreas is suboptimally evaluated due to respiratory motion but grossly unremarkable. Spleen: Normal in size without focal abnormality. Adrenals/Urinary Tract: The adrenal glands unremarkable. The kidneys, and urinary bladder unremarkable. Stomach/Bowel: There is no bowel obstruction or active inflammation. The appendix is normal. Vascular/Lymphatic: Mild aortoiliac atherosclerotic disease. The IVC is unremarkable. No portal venous gas. Indeterminate 1.8 x 2.9 cm infiltrative tissue in the portacaval region (20/4) of indeterminate etiology but suspicious for adenopathy or retroperitoneal fibrosis or a neoplastic process. This can be better  evaluated with MRI without and with contrast on a nonemergent basis. Reproductive: The uterus is grossly unremarkable. No suspicious adnexal masses. Other: None Musculoskeletal: Degenerative changes of the spine and hips. No acute osseous pathology. IMPRESSION: 1. No CT evidence of central pulmonary artery embolus. 2. Small right pleural effusion with subsegmental right lung base atelectasis. Pneumonia is not excluded. 3. Mild thickened appearance of the esophagus may be related to underdistention. Esophagitis or underlying esophageal mass is not excluded. Endoscopy may provide better evaluation. 4. No bowel obstruction. Normal appendix. 5. Indeterminate 1.8 x 2.9 cm infiltrative tissue in the portacaval region of indeterminate etiology but suspicious for adenopathy or retroperitoneal fibrosis or a neoplastic process. This can be better evaluated with MRI without and with contrast on a nonemergent basis. 6.  Aortic  Atherosclerosis (ICD10-I70.0). Electronically Signed   By: Vanetta Chou M.D.   On: 09/15/2024 20:54   CT ABDOMEN PELVIS W CONTRAST Result Date: 09/15/2024 CLINICAL DATA:  Dizziness and fatigue. Concern for pulmonary embolism. Abdominal pain. EXAM: CT ANGIOGRAPHY CHEST CT ABDOMEN AND PELVIS WITH CONTRAST TECHNIQUE: Multidetector CT imaging of the chest was performed using the standard protocol during bolus administration of intravenous contrast. Multiplanar CT image reconstructions and MIPs were obtained to evaluate the vascular anatomy. Multidetector CT imaging of the abdomen and pelvis was performed using the standard protocol during bolus administration of intravenous contrast. RADIATION DOSE REDUCTION: This exam was performed according to the departmental dose-optimization program which includes automated exposure control, adjustment of the mA and/or kV according to patient size and/or use of iterative reconstruction technique. CONTRAST:  OMNIPAQUE IOHEXOL 350 MG/ML SOLN COMPARISON:  CT  abdomen pelvis dated 08/02/2024 FINDINGS: Evaluation of this exam is limited due to respiratory motion and streak artifact caused by patient's arms. CTA CHEST FINDINGS Cardiovascular: There is no cardiomegaly or pericardial effusion. The thoracic aorta is unremarkable. The origins of the great vessels of the aortic arch are patent. Evaluation of the pulmonary arteries is limited due to respiratory motion. No central pulmonary artery embolus identified. Mediastinum/Nodes: No hilar or mediastinal adenopathy. Mild thickened appearance of the esophagus may be related to underdistention. Esophagitis or underlying esophageal mass is not excluded. Clinical correlation is recommended. Lungs/Pleura: Small right pleural effusion. Subsegmental right lung base atelectasis. Pneumonia is not excluded. There is no pneumothorax. The central airways are patent. Musculoskeletal: Degenerative changes of the spine. No acute osseous pathology. Review of the MIP images confirms the above findings. CT ABDOMEN and PELVIS FINDINGS No intra-abdominal free air or free fluid. Hepatobiliary: Similar appearance of faint hypodense lesions in the left lobe of the liver not characterized on this CT. This can be better evaluated with ultrasound or MRI on a nonemergent/outpatient basis. No biliary dilatation. No calcified gallstone. Pancreas: The pancreas is suboptimally evaluated due to respiratory motion but grossly unremarkable. Spleen: Normal in size without focal abnormality. Adrenals/Urinary Tract: The adrenal glands unremarkable. The kidneys, and urinary bladder unremarkable. Stomach/Bowel: There is no bowel obstruction or active inflammation. The appendix is normal. Vascular/Lymphatic: Mild aortoiliac atherosclerotic disease. The IVC is unremarkable. No portal venous gas. Indeterminate 1.8 x 2.9 cm infiltrative tissue in the portacaval region (20/4) of indeterminate etiology but suspicious for adenopathy or retroperitoneal fibrosis or a  neoplastic process. This can be better evaluated with MRI without and with contrast on a nonemergent basis. Reproductive: The uterus is grossly unremarkable. No suspicious adnexal masses. Other: None Musculoskeletal: Degenerative changes of the spine and hips. No acute osseous pathology. IMPRESSION: 1. No CT evidence of central pulmonary artery embolus. 2. Small right pleural effusion with subsegmental right lung base atelectasis. Pneumonia is not excluded. 3. Mild thickened appearance of the esophagus may be related to underdistention. Esophagitis or underlying esophageal mass is not excluded. Endoscopy may provide better evaluation. 4. No bowel obstruction. Normal appendix. 5. Indeterminate 1.8 x 2.9 cm infiltrative tissue in the portacaval region of indeterminate etiology but suspicious for adenopathy or retroperitoneal fibrosis or a neoplastic process. This can be better evaluated with MRI without and with contrast on a nonemergent basis. 6.  Aortic Atherosclerosis (ICD10-I70.0). Electronically Signed   By: Vanetta Chou M.D.   On: 09/15/2024 20:54   CT Head Wo Contrast Result Date: 09/15/2024 CLINICAL DATA:  Poly trauma fall dizzy EXAM: CT HEAD WITHOUT CONTRAST CT CERVICAL SPINE WITHOUT CONTRAST  TECHNIQUE: Multidetector CT imaging of the head and cervical spine was performed following the standard protocol without intravenous contrast. Multiplanar CT image reconstructions of the cervical spine were also generated. RADIATION DOSE REDUCTION: This exam was performed according to the departmental dose-optimization program which includes automated exposure control, adjustment of the mA and/or kV according to patient size and/or use of iterative reconstruction technique. COMPARISON:  Radiograph 08/18/2018, CT brain 09/13/2024 FINDINGS: CT HEAD FINDINGS Brain: No acute territorial infarction, hemorrhage or intracranial mass. Mild atrophy. Stable ventricle size. Partially empty sella Vascular: No hyperdense  vessels.  Carotid vascular calcification. Skull: Normal. Negative for fracture or focal lesion. Sinuses/Orbits: No acute finding. Other: None CT CERVICAL SPINE FINDINGS Alignment: Straightening of the cervical spine. No subluxation. Facet alignment is normal Skull base and vertebrae: No acute fracture. No primary bone lesion or focal pathologic process. Soft tissues and spinal canal: No prevertebral fluid or swelling. No visible canal hematoma. Disc levels: Moderate severe diffuse disc space narrowing C3 through T1. No high-grade canal stenosis Upper chest: Negative. Other: None IMPRESSION: 1. No CT evidence for acute intracranial abnormality. Mild atrophy. 2. Straightening of the cervical spine with degenerative changes. No acute osseous abnormality. Electronically Signed   By: Luke Bun M.D.   On: 09/15/2024 17:19   CT Cervical Spine Wo Contrast Result Date: 09/15/2024 CLINICAL DATA:  Poly trauma fall dizzy EXAM: CT HEAD WITHOUT CONTRAST CT CERVICAL SPINE WITHOUT CONTRAST TECHNIQUE: Multidetector CT imaging of the head and cervical spine was performed following the standard protocol without intravenous contrast. Multiplanar CT image reconstructions of the cervical spine were also generated. RADIATION DOSE REDUCTION: This exam was performed according to the departmental dose-optimization program which includes automated exposure control, adjustment of the mA and/or kV according to patient size and/or use of iterative reconstruction technique. COMPARISON:  Radiograph 08/18/2018, CT brain 09/13/2024 FINDINGS: CT HEAD FINDINGS Brain: No acute territorial infarction, hemorrhage or intracranial mass. Mild atrophy. Stable ventricle size. Partially empty sella Vascular: No hyperdense vessels.  Carotid vascular calcification. Skull: Normal. Negative for fracture or focal lesion. Sinuses/Orbits: No acute finding. Other: None CT CERVICAL SPINE FINDINGS Alignment: Straightening of the cervical spine. No subluxation.  Facet alignment is normal Skull base and vertebrae: No acute fracture. No primary bone lesion or focal pathologic process. Soft tissues and spinal canal: No prevertebral fluid or swelling. No visible canal hematoma. Disc levels: Moderate severe diffuse disc space narrowing C3 through T1. No high-grade canal stenosis Upper chest: Negative. Other: None IMPRESSION: 1. No CT evidence for acute intracranial abnormality. Mild atrophy. 2. Straightening of the cervical spine with degenerative changes. No acute osseous abnormality. Electronically Signed   By: Luke Bun M.D.   On: 09/15/2024 17:19   DG Chest Portable 1 View Result Date: 09/15/2024 EXAM: 1 VIEW(S) XRAY OF THE CHEST 09/15/2024 03:23:07 PM COMPARISON: 07/21/2024 CLINICAL HISTORY: fever FINDINGS: LUNGS AND PLEURA: Low lung volumes. No focal pulmonary opacity. No pulmonary edema. No pleural effusion. No pneumothorax. HEART AND MEDIASTINUM: No acute abnormality of the cardiac and mediastinal silhouettes. BONES AND SOFT TISSUES: Bilateral shoulder degenerative changes. Thoracic spondylosis. IMPRESSION: 1. No acute cardiopulmonary process. 2. Low lung volumes. Electronically signed by: Waddell Calk MD 09/15/2024 03:51 PM EDT RP Workstation: [id number]        Scheduled Meds:  sodium chloride    Intravenous Once   feeding supplement  237 mL Oral BID BM   folic acid  2 mg Oral Daily   pantoprazole (PROTONIX) IV  40 mg Intravenous Q24H   Continuous  Infusions:  sodium chloride  Stopped (09/17/24 0422)   ceFEPime (MAXIPIME) IV 200 mL/hr at 09/17/24 0443   vancomycin Stopped (09/16/24 1906)     LOS: 1 day      Renato Applebaum, MD [facility]

## 2024-09-17 NOTE — Consult Note (Signed)
 [facility] Gastroenterology Consult  Referring Provider: No ref. provider found Primary Care Physician:  Arloa Elsie SAUNDERS, MD Primary Gastroenterologist: [facility]  Reason for Consultation: Symptomatic anemia  SUBJECTIVE:   HPI: Erica [name] is a 78 y.o. 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  She has been seen by [facility] gastroenterology in consultation on 08/13/2024 for abdominal pain and constipation.  She was recommended to start regular MiraLAX therapy, high-fiber diet and consider Linzess if symptoms did not improve.  No prior colonoscopy and was not interested in colonoscopy at time of evaluation.  Presented to hospital with symptomatic anemia, dizziness.  Found to have hemoglobin 6.9, hemoglobin was 10.2 on 08/26/2024, MCV 84, iron 49, lactic acid 4.1, BUN/creatinine 8/0.62, total bilirubin 0.5.  CT scan of abdomen pelvis on 09/15/2024 showed faint hypodense lesions in the liver, no bowel inflammation, indeterminant 1.8 x 2.9 cm lesion in the portal caval region, mild thickened appearance of the esophagus.  She has been febrile to 39 Celsius on 09/17/2024 at 0953.  She is on vancomycin and cefepime.  Fecal occult blood testing has been negative.  She is undergoing anemia workup through hematology.  On my evaluation of patient, patient denied any melena or hematochezia.  No nausea or vomiting.  She is not certain why she presented to the hospital, denied dizziness.  No chest pain.  Did endorse some shortness of breath.  Noted that she has chronic constipation.    Past Medical History:  Diagnosis Date   Bilateral swelling of feet    Cancer (HCC)    Cataract    Diabetes mellitus without complication (HCC)    diet controlled   Eczema    Elevated cholesterol    GERD (gastroesophageal reflux disease)    Glaucoma    Hypertension    Mitral valve prolapse    Simple endometrial hyperplasia  without atypia 07/2016   In endometrial polyp with surrounding endometrium inactive recommend follow up ultrasound for endometrial echo 1 year   Spondylisthesis    Past Surgical History:  Procedure Laterality Date   CESAREAN SECTION     DILATATION & CURETTAGE/HYSTEROSCOPY WITH MYOSURE N/A 08/05/2016   Procedure: DILATATION & CURETTAGE/HYSTEROSCOPY WITH MYOSURE;  Surgeon: Evalene SHAUNNA Organ, MD;  Location: [facility];  Service: Gynecology;  Laterality: N/A;   EXCISION OF SKIN TAG Left 08/05/2016   Procedure: EXCISION OF SKIN TAG;  Surgeon: Evalene SHAUNNA Organ, MD;  Location: [facility];  Service: Gynecology;  Laterality: Left;   MOUTH SURGERY     spinal tap     THORACENTESIS N/A 11/02/2023   Procedure: THORACENTESIS;  Surgeon: Annella Donnice SAUNDERS, MD;  Location: [facility] ENDOSCOPY;  Service: Pulmonary;  Laterality: N/A;   Prior to Admission medications   Medication Sig Start Date End Date Taking? Authorizing Provider  allopurinol  (ZYLOPRIM ) 300 MG tablet Take 1 tablet by mouth daily. 12/29/23   [provider]  aspirin  EC 81 MG tablet Take 81 mg by mouth daily. Swallow whole.    [provider]  cefUROXime (CEFTIN) 500 MG tablet Take 500 mg by mouth 2 (two) times daily. 10 day course.    [provider]  fluticasone (FLONASE) 50 MCG/ACT nasal spray Place 1 spray into both nostrils daily as needed for allergies.    [provider]  furosemide  (LASIX ) 20 MG tablet Take 20 mg by mouth daily. 09/06/20   [provider]  gabapentin  (NEURONTIN ) 100 MG capsule Take 100 mg  by mouth 3 (three) times daily. 10/23/20   [provider]  ibuprofen (ADVIL) 800 MG tablet Take 800 mg by mouth every 8 (eight) hours as needed for headache (pain).    [provider]  latanoprost  (XALATAN ) 0.005 % ophthalmic solution Place 1 drop into both eyes at bedtime. BRAND NAME    [provider]  loratadine (CLARITIN) 10 MG tablet Take 10 mg by mouth daily as needed for  allergies.    [provider]  PONATinib  HCl (ICLUSIG ) 45 MG tablet Take 45 mg by mouth daily. 12/29/23 12/28/24  [provider]  propranolol  (INDERAL ) 10 MG tablet Take 10 mg by mouth 2 (two) times daily as needed (palpitations).    [provider]  traMADol  (ULTRAM ) 50 MG tablet Take 50 mg by mouth every 6 (six) hours as needed for moderate pain.     [provider]  triamcinolone cream (KENALOG) 0.1 % Apply 1 application  topically 2 (two) times daily as needed (skin irritation).    [provider]   Current Facility-Administered Medications  Medication Dose Route Frequency Provider Last Rate Last Admin   0.9 %  sodium chloride  infusion (Manually program via Guardrails IV Fluids)   Intravenous Once Sundil, Subrina, MD   Held at 09/16/24 0546   acetaminophen  (TYLENOL ) tablet 650 mg  650 mg Oral Q6H PRN Sundil, Subrina, MD   650 mg at 09/17/24 1008   Or   acetaminophen  (TYLENOL ) suppository 650 mg  650 mg Rectal Q6H PRN Sundil, Subrina, MD       ceFEPIme (MAXIPIME) 2 g in sodium chloride  0.9 % 100 mL IVPB  2 g Intravenous Q12H Poindexter, Leann T, RPH 200 mL/hr at 09/17/24 0443 Infusion Verify at 09/17/24 0443   feeding supplement (ENSURE PLUS HIGH PROTEIN) liquid 237 mL  237 mL Oral BID BM Ghimire, Kuber, MD   237 mL at 09/17/24 1420   folic acid (FOLVITE) tablet 2 mg  2 mg Oral Daily Ghimire, Kuber, MD   2 mg at 09/17/24 1017   ondansetron  (ZOFRAN ) tablet 4 mg  4 mg Oral Q6H PRN Sundil, Subrina, MD       Or   ondansetron  (ZOFRAN ) injection 4 mg  4 mg Intravenous Q6H PRN Sundil, Subrina, MD   4 mg at 09/17/24 1419   pantoprazole (PROTONIX) injection 40 mg  40 mg Intravenous Q24H Sundil, Subrina, MD   40 mg at 09/17/24 0417   vancomycin (VANCOREADY) IVPB 750 mg/150 mL  750 mg Intravenous Q24H Kemp Arvin DASEN, RPH   Stopped at 09/16/24 1906   Allergies as of 09/15/2024 - Review Complete 09/15/2024  Allergen Reaction Noted   Penicillins Hives  06/06/2016   Sulfa antibiotics Hives 06/06/2016   Family History  Problem Relation Age of Onset   Heart disease Mother    Heart disease Father    Breast cancer Sister 24   Social History   Socioeconomic History   Marital status: Divorced    Spouse name: Not on file   Number of children: Not on file   Years of education: Not on file   Highest education level: Not on file  Occupational History   Not on file  Tobacco Use   Smoking status: Never   Smokeless tobacco: Never  Vaping Use   Vaping status: Never Used  Substance and Sexual Activity   Alcohol use: No    Alcohol/week: 0.0 standard drinks of alcohol   Drug use: No   Sexual activity: Not Currently  Birth control/protection: Post-menopausal    Comment: 1st intercourse 78 yo-Fewer than 5 partners  Other Topics Concern   Not on file  Social History Narrative   Not on file   Social Drivers of Health   Financial Resource Strain: Not on file  Food Insecurity: No Food Insecurity (11/19/2023)   Hunger Vital Sign    Worried About Running Out of Food in the Last Year: Never true    Ran Out of Food in the Last Year: Never true  Transportation Needs: No Transportation Needs (11/19/2023)   PRAPARE - Administrator, Civil Service (Medical): No    Lack of Transportation (Non-Medical): No  Physical Activity: Not on file  Stress: Not on file  Social Connections: Not on file  Intimate Partner Violence: Not At Risk (11/19/2023)   Humiliation, Afraid, Rape, and Kick questionnaire    Fear of Current or Ex-Partner: No    Emotionally Abused: No    Physically Abused: No    Sexually Abused: No   Review of Systems:  Review of Systems  Respiratory:  Positive for shortness of breath.   Cardiovascular:  Negative for chest pain.  Gastrointestinal:  Positive for constipation. Negative for abdominal pain, blood in stool, diarrhea, melena, nausea and vomiting.    OBJECTIVE:   Temp:  [98 F (36.7 C)-102.2 F (39 C)]  98.9 F (37.2 C) (10/25 1100) Pulse Rate:  [88-97] 90 (10/25 0953) Resp:  [17-20] 18 (10/25 0953) BP: (130-156)/(53-67) 156/64 (10/25 0953) SpO2:  [96 %-100 %] 96 % (10/25 0953) Last BM Date :  (PTA) Physical Exam Constitutional:      General: She is not in acute distress.    Appearance: She is not ill-appearing, toxic-appearing or diaphoretic.  Cardiovascular:     Rate and Rhythm: Normal rate. Rhythm irregular.  Pulmonary:     Effort: No respiratory distress.     Breath sounds: Normal breath sounds.  Abdominal:     General: Bowel sounds are normal. There is no distension.     Palpations: Abdomen is soft.     Tenderness: There is abdominal tenderness (LLQ).  Neurological:     Mental Status: She is alert.     Labs: Recent Labs    09/16/24 0600 09/16/24 1609 09/17/24 0116 09/17/24 0814  WBC 7.6 7.2  --  6.8  HGB 6.9* 7.9* 9.0* 9.5*  HCT 21.6* 25.5* 26.7* 28.1*  PLT 279 227  --  189   BMET Recent Labs    09/15/24 1426 09/16/24 0600 09/17/24 0620  NA 132* 134* 135  K 4.6 4.1 3.5  CL 97* 99 104  CO2 25 21* 20*  GLUCOSE 157* 227* 111*  BUN 6* 8 8  CREATININE 0.67 0.71 0.62  CALCIUM 9.5 8.8* 7.9*   LFT Recent Labs    09/17/24 0620  PROT 5.4*  ALBUMIN 2.9*  AST 33  ALT 5  ALKPHOS 65  BILITOT 0.5   PT/INR Recent Labs    09/15/24 1638  LABPROT 15.0  INR 1.1    Diagnostic imaging: CT Angio Chest PE W and/or Wo Contrast Result Date: 09/15/2024 CLINICAL DATA:  Dizziness and fatigue. Concern for pulmonary embolism. Abdominal pain. EXAM: CT ANGIOGRAPHY CHEST CT ABDOMEN AND PELVIS WITH CONTRAST TECHNIQUE: Multidetector CT imaging of the chest was performed using the standard protocol during bolus administration of intravenous contrast. Multiplanar CT image reconstructions and MIPs were obtained to evaluate the vascular anatomy. Multidetector CT imaging of the abdomen and pelvis was performed using the  standard protocol during bolus administration of  intravenous contrast. RADIATION DOSE REDUCTION: This exam was performed according to the departmental dose-optimization program which includes automated exposure control, adjustment of the mA and/or kV according to patient size and/or use of iterative reconstruction technique. CONTRAST:  OMNIPAQUE IOHEXOL 350 MG/ML SOLN COMPARISON:  CT abdomen pelvis dated 08/02/2024 FINDINGS: Evaluation of this exam is limited due to respiratory motion and streak artifact caused by patient's arms. CTA CHEST FINDINGS Cardiovascular: There is no cardiomegaly or pericardial effusion. The thoracic aorta is unremarkable. The origins of the great vessels of the aortic arch are patent. Evaluation of the pulmonary arteries is limited due to respiratory motion. No central pulmonary artery embolus identified. Mediastinum/Nodes: No hilar or mediastinal adenopathy. Mild thickened appearance of the esophagus may be related to underdistention. Esophagitis or underlying esophageal mass is not excluded. Clinical correlation is recommended. Lungs/Pleura: Small right pleural effusion. Subsegmental right lung base atelectasis. Pneumonia is not excluded. There is no pneumothorax. The central airways are patent. Musculoskeletal: Degenerative changes of the spine. No acute osseous pathology. Review of the MIP images confirms the above findings. CT ABDOMEN and PELVIS FINDINGS No intra-abdominal free air or free fluid. Hepatobiliary: Similar appearance of faint hypodense lesions in the left lobe of the liver not characterized on this CT. This can be better evaluated with ultrasound or MRI on a nonemergent/outpatient basis. No biliary dilatation. No calcified gallstone. Pancreas: The pancreas is suboptimally evaluated due to respiratory motion but grossly unremarkable. Spleen: Normal in size without focal abnormality. Adrenals/Urinary Tract: The adrenal glands unremarkable. The kidneys, and urinary bladder unremarkable. Stomach/Bowel: There is no  bowel obstruction or active inflammation. The appendix is normal. Vascular/Lymphatic: Mild aortoiliac atherosclerotic disease. The IVC is unremarkable. No portal venous gas. Indeterminate 1.8 x 2.9 cm infiltrative tissue in the portacaval region (20/4) of indeterminate etiology but suspicious for adenopathy or retroperitoneal fibrosis or a neoplastic process. This can be better evaluated with MRI without and with contrast on a nonemergent basis. Reproductive: The uterus is grossly unremarkable. No suspicious adnexal masses. Other: None Musculoskeletal: Degenerative changes of the spine and hips. No acute osseous pathology. IMPRESSION: 1. No CT evidence of central pulmonary artery embolus. 2. Small right pleural effusion with subsegmental right lung base atelectasis. Pneumonia is not excluded. 3. Mild thickened appearance of the esophagus may be related to underdistention. Esophagitis or underlying esophageal mass is not excluded. Endoscopy may provide better evaluation. 4. No bowel obstruction. Normal appendix. 5. Indeterminate 1.8 x 2.9 cm infiltrative tissue in the portacaval region of indeterminate etiology but suspicious for adenopathy or retroperitoneal fibrosis or a neoplastic process. This can be better evaluated with MRI without and with contrast on a nonemergent basis. 6.  Aortic Atherosclerosis (ICD10-I70.0). Electronically Signed   By: Vanetta Chou M.D.   On: 09/15/2024 20:54   CT ABDOMEN PELVIS W CONTRAST Result Date: 09/15/2024 CLINICAL DATA:  Dizziness and fatigue. Concern for pulmonary embolism. Abdominal pain. EXAM: CT ANGIOGRAPHY CHEST CT ABDOMEN AND PELVIS WITH CONTRAST TECHNIQUE: Multidetector CT imaging of the chest was performed using the standard protocol during bolus administration of intravenous contrast. Multiplanar CT image reconstructions and MIPs were obtained to evaluate the vascular anatomy. Multidetector CT imaging of the abdomen and pelvis was performed using the standard  protocol during bolus administration of intravenous contrast. RADIATION DOSE REDUCTION: This exam was performed according to the departmental dose-optimization program which includes automated exposure control, adjustment of the mA and/or kV according to patient size and/or use of iterative reconstruction  technique. CONTRAST:  OMNIPAQUE IOHEXOL 350 MG/ML SOLN COMPARISON:  CT abdomen pelvis dated 08/02/2024 FINDINGS: Evaluation of this exam is limited due to respiratory motion and streak artifact caused by patient's arms. CTA CHEST FINDINGS Cardiovascular: There is no cardiomegaly or pericardial effusion. The thoracic aorta is unremarkable. The origins of the great vessels of the aortic arch are patent. Evaluation of the pulmonary arteries is limited due to respiratory motion. No central pulmonary artery embolus identified. Mediastinum/Nodes: No hilar or mediastinal adenopathy. Mild thickened appearance of the esophagus may be related to underdistention. Esophagitis or underlying esophageal mass is not excluded. Clinical correlation is recommended. Lungs/Pleura: Small right pleural effusion. Subsegmental right lung base atelectasis. Pneumonia is not excluded. There is no pneumothorax. The central airways are patent. Musculoskeletal: Degenerative changes of the spine. No acute osseous pathology. Review of the MIP images confirms the above findings. CT ABDOMEN and PELVIS FINDINGS No intra-abdominal free air or free fluid. Hepatobiliary: Similar appearance of faint hypodense lesions in the left lobe of the liver not characterized on this CT. This can be better evaluated with ultrasound or MRI on a nonemergent/outpatient basis. No biliary dilatation. No calcified gallstone. Pancreas: The pancreas is suboptimally evaluated due to respiratory motion but grossly unremarkable. Spleen: Normal in size without focal abnormality. Adrenals/Urinary Tract: The adrenal glands unremarkable. The kidneys, and urinary bladder  unremarkable. Stomach/Bowel: There is no bowel obstruction or active inflammation. The appendix is normal. Vascular/Lymphatic: Mild aortoiliac atherosclerotic disease. The IVC is unremarkable. No portal venous gas. Indeterminate 1.8 x 2.9 cm infiltrative tissue in the portacaval region (20/4) of indeterminate etiology but suspicious for adenopathy or retroperitoneal fibrosis or a neoplastic process. This can be better evaluated with MRI without and with contrast on a nonemergent basis. Reproductive: The uterus is grossly unremarkable. No suspicious adnexal masses. Other: None Musculoskeletal: Degenerative changes of the spine and hips. No acute osseous pathology. IMPRESSION: 1. No CT evidence of central pulmonary artery embolus. 2. Small right pleural effusion with subsegmental right lung base atelectasis. Pneumonia is not excluded. 3. Mild thickened appearance of the esophagus may be related to underdistention. Esophagitis or underlying esophageal mass is not excluded. Endoscopy may provide better evaluation. 4. No bowel obstruction. Normal appendix. 5. Indeterminate 1.8 x 2.9 cm infiltrative tissue in the portacaval region of indeterminate etiology but suspicious for adenopathy or retroperitoneal fibrosis or a neoplastic process. This can be better evaluated with MRI without and with contrast on a nonemergent basis. 6.  Aortic Atherosclerosis (ICD10-I70.0). Electronically Signed   By: Vanetta Chou M.D.   On: 09/15/2024 20:54   CT Head Wo Contrast Result Date: 09/15/2024 CLINICAL DATA:  Poly trauma fall dizzy EXAM: CT HEAD WITHOUT CONTRAST CT CERVICAL SPINE WITHOUT CONTRAST TECHNIQUE: Multidetector CT imaging of the head and cervical spine was performed following the standard protocol without intravenous contrast. Multiplanar CT image reconstructions of the cervical spine were also generated. RADIATION DOSE REDUCTION: This exam was performed according to the departmental dose-optimization program which  includes automated exposure control, adjustment of the mA and/or kV according to patient size and/or use of iterative reconstruction technique. COMPARISON:  Radiograph 08/18/2018, CT brain 09/13/2024 FINDINGS: CT HEAD FINDINGS Brain: No acute territorial infarction, hemorrhage or intracranial mass. Mild atrophy. Stable ventricle size. Partially empty sella Vascular: No hyperdense vessels.  Carotid vascular calcification. Skull: Normal. Negative for fracture or focal lesion. Sinuses/Orbits: No acute finding. Other: None CT CERVICAL SPINE FINDINGS Alignment: Straightening of the cervical spine. No subluxation. Facet alignment is normal Skull base and  vertebrae: No acute fracture. No primary bone lesion or focal pathologic process. Soft tissues and spinal canal: No prevertebral fluid or swelling. No visible canal hematoma. Disc levels: Moderate severe diffuse disc space narrowing C3 through T1. No high-grade canal stenosis Upper chest: Negative. Other: None IMPRESSION: 1. No CT evidence for acute intracranial abnormality. Mild atrophy. 2. Straightening of the cervical spine with degenerative changes. No acute osseous abnormality. Electronically Signed   By: Luke Bun M.D.   On: 09/15/2024 17:19   CT Cervical Spine Wo Contrast Result Date: 09/15/2024 CLINICAL DATA:  Poly trauma fall dizzy EXAM: CT HEAD WITHOUT CONTRAST CT CERVICAL SPINE WITHOUT CONTRAST TECHNIQUE: Multidetector CT imaging of the head and cervical spine was performed following the standard protocol without intravenous contrast. Multiplanar CT image reconstructions of the cervical spine were also generated. RADIATION DOSE REDUCTION: This exam was performed according to the departmental dose-optimization program which includes automated exposure control, adjustment of the mA and/or kV according to patient size and/or use of iterative reconstruction technique. COMPARISON:  Radiograph 08/18/2018, CT brain 09/13/2024 FINDINGS: CT HEAD FINDINGS Brain:  No acute territorial infarction, hemorrhage or intracranial mass. Mild atrophy. Stable ventricle size. Partially empty sella Vascular: No hyperdense vessels.  Carotid vascular calcification. Skull: Normal. Negative for fracture or focal lesion. Sinuses/Orbits: No acute finding. Other: None CT CERVICAL SPINE FINDINGS Alignment: Straightening of the cervical spine. No subluxation. Facet alignment is normal Skull base and vertebrae: No acute fracture. No primary bone lesion or focal pathologic process. Soft tissues and spinal canal: No prevertebral fluid or swelling. No visible canal hematoma. Disc levels: Moderate severe diffuse disc space narrowing C3 through T1. No high-grade canal stenosis Upper chest: Negative. Other: None IMPRESSION: 1. No CT evidence for acute intracranial abnormality. Mild atrophy. 2. Straightening of the cervical spine with degenerative changes. No acute osseous abnormality. Electronically Signed   By: Luke Bun M.D.   On: 09/15/2024 17:19   DG Chest Portable 1 View Result Date: 09/15/2024 EXAM: 1 VIEW(S) XRAY OF THE CHEST 09/15/2024 03:23:07 PM COMPARISON: 07/21/2024 CLINICAL HISTORY: fever FINDINGS: LUNGS AND PLEURA: Low lung volumes. No focal pulmonary opacity. No pulmonary edema. No pleural effusion. No pneumothorax. HEART AND MEDIASTINUM: No acute abnormality of the cardiac and mediastinal silhouettes. BONES AND SOFT TISSUES: Bilateral shoulder degenerative changes. Thoracic spondylosis. IMPRESSION: 1. No acute cardiopulmonary process. 2. Low lung volumes. Electronically signed by: Waddell Calk MD 09/15/2024 03:51 PM EDT RP Workstation: [id number]   IMPRESSION: Symptomatic, normocytic anemia, improved Fever of unknown origin CML, being followed by hematology/oncology Unspecified liver lesions Abnormal CT imaging Lactic acidosis GERD Hypertension  PLAN: -No sign of active GI blood loss, fecal occult stool negative, hemoglobin improved, no indication for urgent EGD or  colonoscopy, would additionally defer endoscopic evaluation at this time given fever of unknown origin -Undergoing workup by hematology for further etiology of anemia, also likely contributed to by [facility] therapy -Can consider outpatient EGD to evaluate mildly thickened appearance of the esophagus as patient is asymptomatic, she is established with [facility], would recommend she follow-up with our office in 4-6 weeks after discharge -Would obtain MRI with liver mass protocol to further characterize hypodense lesions in the liver -[facility] will be available as needed   LOS: 1 day   Estefana Keas, DO [facility] Gastroenterology

## 2024-09-17 NOTE — Evaluation (Signed)
 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  Pt currently with functional limitations due to the deficits listed below (see PT Problem List). Pt will benefit from acute skilled PT to increase their independence and safety with mobility to allow discharge.     The patient was very lethargic initially, arouses with stimulation. Patient slow to respond, delayed in Following directions. Patient required mod assistance to roll and move to sitting. Patient then stood at [facility] with min assistance and able to take small side steps to  [facility].  Patient's niece present to assist with history. Patient resides alone, drives and works FT at [employer].  Per niece, patient will have available  family to assist. . Hopefully patient will progress to return home or Patient will benefit from continued inpatient follow up therapy, <3 hours/day       If plan is discharge home, recommend the following: A lot of help with walking and/or transfers;A lot of help with bathing/dressing/bathroom;Assistance with cooking/housework;Assist for transportation;Help with stairs or ramp for entrance   Can travel by private vehicle        Equipment Recommendations Rolling walker (2 wheels)  Recommendations for Other Services       Functional Status Assessment Patient has had a recent decline in their functional status and demonstrates the ability to make significant improvements in function in a reasonable and predictable amount of time.     Precautions / Restrictions Precautions Precautions: Fall       Mobility  Bed Mobility Overal bed mobility: Needs Assistance Bed Mobility: Rolling, Sidelying to Sit, Sit to Supine Rolling: Mod assist Sidelying to sit: Mod assist   Sit to supine: Mod assist   General bed mobility comments: much extra time  to  mobilize, asked several times what am I doing.  Patient required frequent cues to stay on task of sitting up. standing    Transfers Overall transfer level: Needs assistance Equipment used: Rolling walker (2 wheels) Transfers: Sit to/from Stand Sit to Stand: Min assist           General transfer comment: stsdy assist for balance, side steps along the bed x 5, slow to take steps    Ambulation/Gait                  Stairs            Wheelchair Mobility     Tilt Bed    Modified Rankin (Stroke Patients Only)       Balance Overall balance assessment: Needs assistance Sitting-balance support: No upper extremity supported, Feet supported Sitting balance-Leahy Scale: Fair     Standing balance support: Reliant on assistive device for balance, During functional activity, Bilateral upper extremity supported Standing balance-Leahy Scale: Poor                               Pertinent Vitals/Pain Pain Assessment Pain Assessment: No/denies pain    Home Living Family/patient expects to be discharged to:: Private residence Living Arrangements: Alone Available Help at Discharge: Family;Available PRN/intermittently Type of Home: House Home Access: Level entry  Home Layout: One level Home Equipment: Cane - single point      Prior Function Prior Level of Function : Independent/Modified Independent;Driving;Working/employed             Mobility Comments: Pt ambulates ind and is still employed ADLs Comments: ind with ADL's and IADl's     Extremity/Trunk Assessment   Upper Extremity Assessment Upper Extremity Assessment: Generalized weakness    Lower Extremity Assessment Lower  Extremity Assessment: Generalized weakness    Cervical / Trunk Assessment Cervical / Trunk Assessment: Normal  Communication   Communication Communication: Impaired Factors Affecting Communication: Reduced clarity of speech    Cognition Arousal: Lethargic Behavior During Therapy: Flat affect   PT - Cognitive impairments: Orientation   Orientation impairments: Time, Situation                   PT - Cognition Comments: very slow to respond, strong stimulation to awaken, niece present and provides some history. Following commands: Impaired Following commands impaired: Follows one step commands inconsistently, Follows one step commands with increased time     Cueing Cueing Techniques: Verbal cues, Tactile cues     General Comments      Exercises     Assessment/Plan    PT Assessment Patient needs continued PT services  PT Problem List Decreased strength;Decreased range of motion;Decreased cognition;Decreased activity tolerance;Decreased knowledge of use of DME;Decreased balance;Decreased safety awareness;Decreased mobility;Decreased knowledge of precautions       PT Treatment Interventions DME instruction;Balance training;Gait training;Cognitive remediation;Functional mobility training;Therapeutic activities;Therapeutic exercise    PT Goals (Current goals can be found in the Care Plan section)  Acute Rehab PT Goals Patient Stated Goal: go home, back to work PT Goal Formulation: With patient/family Time For Goal Achievement: 10/01/24 Potential to Achieve Goals: Good    Frequency Min 2X/week     Co-evaluation               AM-PAC PT 6 Clicks Mobility  Outcome Measure Help needed turning from your back to your side while in a flat bed without using bedrails?: A Lot Help needed moving from lying on your back to sitting on the side of a flat bed without using bedrails?: A Lot Help needed moving to and from a bed to a chair (including a wheelchair)?: A  Lot Help needed standing up from a chair using your arms (e.g., wheelchair or bedside chair)?: A Lot Help needed to walk in hospital room?: Total Help needed climbing 3-5 steps with a railing? : Total 6 Click Score: 10    End of Session Equipment Utilized During Treatment: Gait belt Activity Tolerance: Patient tolerated treatment well Patient left: in bed;with call bell/phone within reach;with family/visitor present;with bed alarm set Nurse Communication: Mobility status PT Visit Diagnosis: Unsteadiness on feet (R26.81);Difficulty in walking, not elsewhere classified (R26.2);Muscle weakness (generalized) (M62.81)    Time: 8886-8857 PT Time Calculation (min) (ACUTE ONLY): 29 min   Charges:   PT Evaluation $PT Eval Low Complexity: 1 Low PT Treatments $Therapeutic Activity: 8-22 mins PT General Charges $$ ACUTE PT VISIT: 1 Visit         Darice Potters PT Acute Rehabilitation Services Office [phone]   Potters Darice Norris 09/17/2024, 2:25 PM

## 2024-09-17 NOTE — Progress Notes (Signed)
 Mobility Specialist - Progress Note   09/17/24 0843  Mobility  Activity Pivoted/transferred to/from [facility]  Level of Assistance Moderate assist, patient does 50-74%  Assistive Device Front wheel walker  Range of Motion/Exercises Active Assistive  Activity Response Tolerated well  Mobility visit 1 Mobility  Mobility Specialist Start Time (ACUTE ONLY) 0820  Mobility Specialist Stop Time (ACUTE ONLY) [id number]  Mobility Specialist Time Calculation (min) (ACUTE ONLY) 23 min   Upon entering room pt stated needing to [facility]. Mod-A for bed mobility and required cues for hand placement to transfer. At EOS returned to bed with all needs met. Call bell in reach and RN notified.   Erminio Leos,  Mobility Specialist Can be reached via Secure Chat

## 2024-09-18 DIAGNOSIS — D649 Anemia, unspecified: Secondary | ICD-10-CM | POA: Diagnosis not present

## 2024-09-18 LAB — CBC WITH DIFFERENTIAL/PLATELET
Abs Immature Granulocytes: 0.24 K/uL — ABNORMAL HIGH (ref 0.00–0.07)
Basophils Absolute: 0 K/uL (ref 0.0–0.1)
Basophils Relative: 1 %
Eosinophils Absolute: 0 K/uL (ref 0.0–0.5)
Eosinophils Relative: 1 %
HCT: 25.8 % — ABNORMAL LOW (ref 36.0–46.0)
Hemoglobin: 8.8 g/dL — ABNORMAL LOW (ref 12.0–15.0)
Immature Granulocytes: 5 %
Lymphocytes Relative: 17 %
Lymphs Abs: 0.8 K/uL (ref 0.7–4.0)
MCH: 29.8 pg (ref 26.0–34.0)
MCHC: 34.1 g/dL (ref 30.0–36.0)
MCV: 87.5 fL (ref 80.0–100.0)
Monocytes Absolute: 1 K/uL (ref 0.1–1.0)
Monocytes Relative: 20 %
Neutro Abs: 2.8 K/uL (ref 1.7–7.7)
Neutrophils Relative %: 56 %
Platelets: 158 K/uL (ref 150–400)
RBC: 2.95 MIL/uL — ABNORMAL LOW (ref 3.87–5.11)
RDW: 17.8 % — ABNORMAL HIGH (ref 11.5–15.5)
WBC: 4.8 K/uL (ref 4.0–10.5)
nRBC: 0 % (ref 0.0–0.2)

## 2024-09-18 LAB — COMPREHENSIVE METABOLIC PANEL WITH GFR
ALT: <5 U/L (ref 0–44)
AST: 30 U/L (ref 15–41)
Albumin: 2.7 g/dL — ABNORMAL LOW (ref 3.5–5.0)
Alkaline Phosphatase: 67 U/L (ref 38–126)
Anion gap: 11 (ref 5–15)
BUN: 9 mg/dL (ref 8–23)
CO2: 20 mmol/L — ABNORMAL LOW (ref 22–32)
Calcium: 8 mg/dL — ABNORMAL LOW (ref 8.9–10.3)
Chloride: 106 mmol/L (ref 98–111)
Creatinine, Ser: 0.62 mg/dL (ref 0.44–1.00)
GFR, Estimated: >60 mL/min (ref >60)
Glucose, Bld: 80 mg/dL (ref 70–99)
Potassium: 3.4 mmol/L — ABNORMAL LOW (ref 3.5–5.1)
Sodium: 136 mmol/L (ref 135–145)
Total Bilirubin: 0.5 mg/dL (ref 0.0–1.2)
Total Protein: 5.1 g/dL — ABNORMAL LOW (ref 6.5–8.1)

## 2024-09-18 LAB — PHOSPHORUS: Phosphorus: 1.8 mg/dL — ABNORMAL LOW (ref 2.5–4.6)

## 2024-09-18 LAB — MAGNESIUM: Magnesium: 1.8 mg/dL (ref 1.7–2.4)

## 2024-09-18 MED ORDER — POTASSIUM CHLORIDE CRYS ER 20 MEQ PO TBCR
20.0000 meq | EXTENDED_RELEASE_TABLET | Freq: Two times a day (BID) | ORAL | Status: AC
  Administered 2024-09-18 – 2024-09-19 (×4): 20 meq via ORAL
  Filled 2024-09-18 (×4): qty 1

## 2024-09-18 MED ORDER — POTASSIUM PHOSPHATES 15 MMOLE/5ML IV SOLN
30.0000 mmol | Freq: Once | INTRAVENOUS | Status: AC
  Administered 2024-09-18: 30 mmol via INTRAVENOUS
  Filled 2024-09-18: qty 10

## 2024-09-18 MED ORDER — MELATONIN 5 MG PO TABS
5.0000 mg | ORAL_TABLET | Freq: Once | ORAL | Status: AC
  Administered 2024-09-18: 5 mg via ORAL
  Filled 2024-09-18: qty 1

## 2024-09-18 MED ORDER — TRAMADOL HCL 50 MG PO TABS
25.0000 mg | ORAL_TABLET | Freq: Once | ORAL | Status: AC | PRN
  Administered 2024-09-18: 25 mg via ORAL
  Filled 2024-09-18: qty 1

## 2024-09-18 NOTE — Progress Notes (Signed)
 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  In the emergency room she was found to be febrile with temperature 102, tachypneic.  Blood pressure was stable.  She was on room air.  WBC count was normal.  Hemoglobin 6.3 with recent hemoglobin of 11.6 in December 2024. Skeletal survey including CT head and CT cervical spine was negative. CTA of the chest, abdomen and pelvis with no evidence of PE, small pleural effusion, subsegmental right lung base atelectasis.  Esophageal thickening.  Cultures were drawn.  Started on blood transfusions.  Broad-spectrum antibiotics and admitted to the hospital.  Subjective: Patient seen and examined.  Denies any complaints.  Appetite is poor.  Afebrile overnight. Negative cultures. Motivated to get around and walk.   Assessment & Plan:   Sepsis present on admission, source unknown: Presented with fever, tachypnea and borderline hypotension.  Lactic acidosis of more than 4. COVID, RSV and flu negative. CT of the chest with possible right lung base atelectasis or pneumonia. UA was normal. Blood cultures, negative so far. Resuscitated.  Lactic acid trending down. Will continue vancomycin and cefepime today not knowing the source of infection.  Continue broad-spectrum antibiotics until afebrile for 24 hours.  Acute infective encephalopathy: CT scan without any abnormality.  She does not have any focal deficits.  Mental status improved.  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  Follows up with oncology at [facility]. Seen by oncologist.  Most of the hemolysis labs were negative.  Folic  acid was 4.  Adding folic acid. Received 2 units of PRBC with appropriate response. FOBT negative.  No evidence of active bleeding.  No evidence of hemolysis.  Monitor.  Abnormal CT scan of the esophagus: Discussed with gastroenterology.  Suggested recovery and follow-up endoscopies as outpatient.    Type 2 diabetes: Well-controlled.  On SSI.  Chronic diastolic dysfunction: Holding nitrates and Lasix .    DVT prophylaxis: SCDs Start: 09/16/24 0503 Place TED hose Start: 09/16/24 0503   Code Status: Full code Family Communication: None today. Disposition Plan: Status is: Inpatient Remains inpatient appropriate because: Severe significant illness,     Consultants:  Oncology  Procedures:  None  Antimicrobials:  Vancomycin and cefepime 10/24---     Objective: Vitals:   09/17/24 1100 09/17/24 1600 09/17/24 2054 09/18/24 0440  BP:  (!) 139/59 (!) 144/57 (!) 135/51  Pulse:  85 93 86  Resp:   (!) 24 (!) 24  Temp: 98.9 F (37.2 C) 99.2 F (37.3 C) 99.7 F (37.6 C) 98.5 F (36.9 C)  TempSrc: Oral Oral Oral Oral  SpO2:  97% 97% 98%  Weight:      Height:        Intake/Output Summary (Last 24 hours) at 09/18/2024 1104 Last data filed at 09/18/2024 1000 Gross per 24 hour  Intake 862.25 ml  Output --  Net 862.25 ml   Filed Weights   09/15/24 1419  Weight: 72.8 kg    Examination:  General exam: Frail looking.  Comfortable and interactive today. Respiratory system: Clear to auscultation. Respiratory effort normal.  No added sounds. Cardiovascular system: S1 & S2 heard, RRR.  Gastrointestinal system: Soft.  Nontender.  No rigidity  or guarding. Central nervous system: Alert and awake.  Mostly oriented.  No focal deficits.    Data Reviewed: I have personally reviewed following labs and imaging studies  CBC: Recent Labs  Lab 09/16/24 0135 09/16/24 0600 09/16/24 1609 09/17/24 0116 09/17/24 0814 09/18/24 0542  WBC 7.7 7.6 7.2  --  6.8 4.8  NEUTROABS  --    --   --   --  5.3 2.8  HGB 6.2* 6.9* 7.9* 9.0* 9.5* 8.8*  HCT 18.6* 21.6* 25.5* 26.7* 28.1* 25.8*  MCV 81.6 84.0 90.7  --  86.7 87.5  PLT 258 279 227  --  189 158   Basic Metabolic Panel: Recent Labs  Lab 09/15/24 1426 09/16/24 0600 09/17/24 0620 09/18/24 0542  NA 132* 134* 135 136  K 4.6 4.1 3.5 3.4*  CL 97* 99 104 106  CO2 25 21* 20* 20*  GLUCOSE 157* 227* 111* 80  BUN 6* 8 8 9   CREATININE 0.67 0.71 0.62 0.62  CALCIUM 9.5 8.8* 7.9* 8.0*  MG  --   --   --  1.8  PHOS  --   --   --  1.8*   GFR: Estimated Creatinine Clearance: 51.1 mL/min (by C-G formula based on SCr of 0.62 mg/dL). Liver Function Tests: Recent Labs  Lab 09/15/24 1426 09/16/24 0600 09/17/24 0620 09/18/24 0542  AST 30 38 33 30  ALT 5 7 5  <5  ALKPHOS 84 76 65 67  BILITOT 0.5 0.5 0.5 0.5  PROT 6.5 6.0* 5.4* 5.1*  ALBUMIN 3.6 3.3* 2.9* 2.7*   No results for input(s): LIPASE, AMYLASE in the last 168 hours. Recent Labs  Lab 09/16/24 0800  AMMONIA 25   Coagulation Profile: Recent Labs  Lab 09/15/24 1638  INR 1.1   Cardiac Enzymes: No results for input(s): CKTOTAL, CKMB, CKMBINDEX, TROPONINI in the last 168 hours. BNP (last 3 results) No results for input(s): PROBNP in the last 8760 hours. HbA1C: No results for input(s): HGBA1C in the last 72 hours. CBG: Recent Labs  Lab 09/15/24 1408  GLUCAP 133*   Lipid Profile: No results for input(s): CHOL, HDL, LDLCALC, TRIG, CHOLHDL, LDLDIRECT in the last 72 hours. Thyroid Function Tests: Recent Labs    09/16/24 0800  TSH 1.120   Anemia Panel: Recent Labs    09/15/24 1638 09/16/24 0800  VITAMINB12 912 1,462*  FOLATE 7.7 4.6*  FERRITIN 1,294* 1,283*  TIBC 239* 211*  IRON 46 49  RETICCTPCT <0.4* <0.4*   Sepsis Labs: Recent Labs  Lab 09/15/24 1640 09/15/24 1848 09/16/24 0600 09/16/24 1056  LATICACIDVEN 2.2* 2.1* 4.1* 2.9*    Recent Results (from the past 240 hours)  Resp panel by RT-PCR (RSV, Flu A&B,  Covid) Anterior Nasal Swab     Status: None   Collection Time: 09/15/24  2:26 PM   Specimen: Anterior Nasal Swab  Result Value Ref Range Status   SARS Coronavirus 2 by RT PCR NEGATIVE NEGATIVE Final    Comment: (NOTE) SARS-CoV-2 target nucleic acids are NOT DETECTED.  The SARS-CoV-2 RNA is generally detectable in upper respiratory specimens during the acute phase of infection. The lowest concentration of SARS-CoV-2 viral copies this assay can detect is 138 copies/mL. A negative result does not preclude SARS-Cov-2 infection and should not be used as the sole basis for treatment or other patient management decisions. A negative result may occur with  improper specimen collection/handling, submission of specimen other than nasopharyngeal swab, presence of viral mutation(s) within the areas targeted by this assay,  and inadequate number of viral copies(<138 copies/mL). A negative result must be combined with clinical observations, patient history, and epidemiological information. The expected result is Negative.  Fact Sheet for Patients:  bloggercourse.com  Fact Sheet for Healthcare Providers:  seriousbroker.it  This test is no t yet approved or cleared by the [place]  FDA and  has been authorized for detection and/or diagnosis of SARS-CoV-2 by FDA under an Emergency Use Authorization (EUA). This EUA will remain  in effect (meaning this test can be used) for the duration of the COVID-19 declaration under Section 564(b)(1) of the Act, 21 U.S.C.section 360bbb-3(b)(1), unless the authorization is terminated  or revoked sooner.       Influenza A by PCR NEGATIVE NEGATIVE Final   Influenza B by PCR NEGATIVE NEGATIVE Final    Comment: (NOTE) The Xpert Xpress SARS-CoV-2/FLU/RSV plus assay is intended as an aid in the diagnosis of influenza from Nasopharyngeal swab specimens and should not be used as a sole basis for treatment. Nasal  washings and aspirates are unacceptable for Xpert Xpress SARS-CoV-2/FLU/RSV testing.  Fact Sheet for Patients: bloggercourse.com  Fact Sheet for Healthcare Providers: seriousbroker.it  This test is not yet approved or cleared by the [place]  FDA and has been authorized for detection and/or diagnosis of SARS-CoV-2 by FDA under an Emergency Use Authorization (EUA). This EUA will remain in effect (meaning this test can be used) for the duration of the COVID-19 declaration under Section 564(b)(1) of the Act, 21 U.S.C. section 360bbb-3(b)(1), unless the authorization is terminated or revoked.     Resp Syncytial Virus by PCR NEGATIVE NEGATIVE Final    Comment: (NOTE) Fact Sheet for Patients: bloggercourse.com  Fact Sheet for Healthcare Providers: seriousbroker.it  This test is not yet approved or cleared by the [place]  FDA and has been authorized for detection and/or diagnosis of SARS-CoV-2 by FDA under an Emergency Use Authorization (EUA). This EUA will remain in effect (meaning this test can be used) for the duration of the COVID-19 declaration under Section 564(b)(1) of the Act, 21 U.S.C. section 360bbb-3(b)(1), unless the authorization is terminated or revoked.  Performed at [facility], [address], [place], [place] [address]   Blood culture (routine x 2)     Status: None (Preliminary result)   Collection Time: 09/15/24  4:50 PM   Specimen: BLOOD  Result Value Ref Range Status   Specimen Description   Final    BLOOD BLOOD LEFT FOREARM Performed at [facility], [address] [place], [place] [address]    Special Requests   Final    BOTTLES DRAWN AEROBIC AND ANAEROBIC Blood Culture adequate volume Performed at [facility], [address], [place], [place] [address]    Culture   Final    NO  GROWTH 3 DAYS Performed at [facility], [address], [place], [place] [address]    Report Status PENDING  Incomplete  Blood culture (routine x 2)     Status: None (Preliminary result)   Collection Time: 09/16/24  6:00 AM   Specimen: BLOOD  Result Value Ref Range Status   Specimen Description   Final    BLOOD LEFT ANTECUBITAL Performed at [facility], [address] [place], [place] [address]    Special Requests   Final    BOTTLES DRAWN AEROBIC AND ANAEROBIC Blood Culture results may not be optimal due to an inadequate volume of blood received in culture bottles Performed at [facility], [address] [place], [place] [address]    Culture   Final    NO GROWTH 2 DAYS Performed at [facility], [address], [place], [place] [address]    Report Status PENDING  Incomplete         Radiology Studies: No results found.       Scheduled Meds:  sodium chloride    Intravenous Once   feeding supplement  237 mL Oral BID BM   folic acid  2 mg Oral Daily   pantoprazole (PROTONIX) IV  40 mg Intravenous Q24H   potassium chloride   20 mEq Oral BID   Continuous Infusions:  ceFEPime (MAXIPIME) IV Stopped (09/18/24 0536)   potassium PHOSPHATE IVPB (in mmol) 30 mmol (09/18/24 0953)   vancomycin 750 mg (09/17/24 1713)     LOS: 2 days      Renato Applebaum, MD [facility]

## 2024-09-18 NOTE — Evaluation (Signed)
 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 Pt works for [employer]. Pt presents now initially lethargic w/ slower processing and requiring cues for sequencing. Once initiating with activity, gradual improvements noted. Pt requiring Mod A for bathroom mobility without AD improving to CGA with use of RW. Pt requires Min A for UB ADL and Mod A for LB ADLs d/t deficits. As pt is significantly below baseline and only PRN family assist available, recommend consideration of continued inpatient follow up therapy, <3 hours/day at DC. However, with continued OOB activity, pt with potential to progress with acute therapies to return home. Will continue to follow acutely.     If plan is discharge home, recommend the following:   A little help with walking and/or transfers;A lot of help with bathing/dressing/bathroom;Assistance with cooking/housework     Functional Status Assessment   Patient has had a recent decline in their functional status and demonstrates the ability to make significant improvements in function in a reasonable and predictable amount of time.     Equipment Recommendations   BSC/3in1;Other (comment) (RW)     Recommendations for Other Services         Precautions/Restrictions   Precautions Precautions: Fall Restrictions Weight Bearing Restrictions Per Provider Order: No     Mobility Bed Mobility Overal bed mobility: Needs Assistance Bed Mobility: Supine to Sit      Supine to sit: Mod assist, HOB elevated, Used rails     General bed mobility comments: assist to advance LE to EOB and lift trunk. difficulty problem solving task    Transfers Overall transfer level: Needs assistance Equipment used: Rolling walker (2 wheels) Transfers: Sit to/from Stand Sit to Stand: Min assist           General transfer comment: from bedside and low toilet      Balance Overall balance assessment: Needs assistance Sitting-balance support: No upper extremity supported, Feet supported Sitting balance-Leahy Scale: Fair     Standing balance support: Reliant on assistive device for balance, During functional activity, Bilateral upper extremity supported Standing balance-Leahy Scale: Poor                             ADL either performed or assessed with clinical judgement   ADL Overall ADL's : Needs assistance/impaired Eating/Feeding: Set up   Grooming: Set up;Sitting;Supervision/safety   Upper Body Bathing: Minimal assistance;Sitting   Lower Body Bathing: Moderate assistance;Sit to/from stand;Sitting/lateral leans   Upper Body Dressing : Minimal assistance;Sitting   Lower Body Dressing: Moderate assistance;Sitting/lateral leans;Sit to/from stand   Toilet Transfer: Contact guard assist;Moderate assistance;Ambulation;Regular Teacher, Adult Education Details (indicate cue type and reason): Mod A for mobility without AD via handheld assist and IV pole - slow moving and offbalance. Improving to CGA with RW use - Min A to stand from low toilet Toileting- Clothing Manipulation and Hygiene: Minimal assistance;Sit to/from stand;Sitting/lateral lean Toileting - Clothing Manipulation Details (indicate cue type and reason): initiated hygiene, assist needed to locate and pull off TP. assist for throughness in standing     Functional mobility during ADLs:  Contact guard assist;Moderate assistance;Rolling walker (2 wheels)       Vision Baseline  Vision/History: 1 Wears glasses Ability to See in Adequate Light: 0 Adequate Patient Visual Report: No change from baseline Vision Assessment?: No apparent visual deficits     Perception         Praxis         Pertinent Vitals/Pain Pain Assessment Pain Assessment: No/denies pain     Extremity/Trunk Assessment Upper Extremity Assessment Upper Extremity Assessment: Generalized weakness;Right hand dominant   Lower Extremity Assessment Lower Extremity Assessment: Defer to PT evaluation   Cervical / Trunk Assessment Cervical / Trunk Assessment: Normal   Communication Communication Communication: Impaired Factors Affecting Communication: Reduced clarity of speech   Cognition Arousal: Lethargic Behavior During Therapy: Flat affect Cognition: Cognition impaired     Awareness: Intellectual awareness intact, Online awareness impaired Memory impairment (select all impairments): Working memory Attention impairment (select first level of impairment): Selective attention Executive functioning impairment (select all impairments): Problem solving, Sequencing, Reasoning OT - Cognition Comments: flat affect, lethargic initially and requiring increased time to initiate, sequence and problem solve. cues needed throughout                 Following commands: Impaired Following commands impaired: Follows one step commands with increased time, Follows multi-step commands inconsistently     Cueing  General Comments   Cueing Techniques: Verbal cues;Tactile cues      Exercises     Shoulder Instructions      Home Living Family/patient expects to be discharged to:: Private residence Living Arrangements: Alone Available Help at Discharge: Family;Available PRN/intermittently Type of Home: House Home Access: Level entry     Home Layout: One level     Bathroom Shower/Tub: Chief Strategy Officer: Standard     Home Equipment: Cane - single point           Prior Functioning/Environment Prior Level of Function : Independent/Modified Independent;Driving;Working/employed             Mobility Comments: Indep without AD ADLs Comments: Indep with ADLs, IADLs, still employed    OT Problem List: Decreased strength;Decreased activity tolerance;Impaired balance (sitting and/or standing);Decreased cognition;Decreased knowledge of use of DME or AE   OT Treatment/Interventions: Self-care/ADL training;Therapeutic exercise;DME and/or AE instruction;Energy conservation;Therapeutic activities;Patient/family education;Balance training      OT Goals(Current goals can be found in the care plan section)   Acute Rehab OT Goals Patient Stated Goal: go to bathroom, none stated OT Goal Formulation: With patient Time For Goal Achievement: 10/03/24 Potential to Achieve Goals: Good   OT Frequency:  Min 2X/week    Co-evaluation              AM-PAC OT 6 Clicks Daily Activity     Outcome Measure Help from another person eating meals?: A Little Help from another person taking care of personal grooming?: A Little Help from another person toileting, which includes using toliet, bedpan, or urinal?: A Lot Help from another person bathing (including washing, rinsing, drying)?: A Lot Help from another person to put on and taking off regular upper body clothing?: A Little Help from another person to put on and taking off regular lower body clothing?: A Lot 6 Click Score: 15   End of Session Equipment Utilized During Treatment: Gait belt;Rolling walker (2 wheels) Nurse Communication: Mobility status  Activity Tolerance: Patient tolerated treatment well Patient left: in chair;with call bell/phone within reach;with chair alarm set;with nursing/sitter in room  OT Visit  Diagnosis: Unsteadiness on feet (R26.81);Other abnormalities of gait and mobility (R26.89);Muscle weakness (generalized) (M62.81)                Time: 9251-9192 OT Time Calculation (min):  19 min Charges:  OT General Charges $OT Visit: 1 Visit OT Evaluation $OT Eval Moderate Complexity: 1 Mod  Mliss NOVAK, OTR/L Acute Rehab Services Office: [phone]   Mliss Fish 09/18/2024, 8:19 AM

## 2024-09-18 NOTE — Plan of Care (Signed)
 Chart reviewed.  Remains on broad-spectrum antibiotics.  Blood cultures negative so far.  Mentation slowly improving.  White count within normal limits.  Hemoglobin responded appropriately to PRBC transfusion and stable at 8.8 today.  She is on folic acid because of decreased folic acid on labs.  Continue to hold Ponatinib  for now until infection issues are ruled out completely.  She can resume this outpatient and resume follow-up with her primary oncologist at [facility].  No additional recommendations from our standpoint at this time.  Please call us  with any questions.

## 2024-09-19 DIAGNOSIS — D649 Anemia, unspecified: Secondary | ICD-10-CM | POA: Diagnosis not present

## 2024-09-19 LAB — BPAM RBC
Blood Product Expiration Date: 202511242359
Blood Product Expiration Date: 202511242359
ISSUE DATE / TIME: 202510241051
ISSUE DATE / TIME: 202510241654
Unit Type and Rh: 5100
Unit Type and Rh: 5100

## 2024-09-19 LAB — TYPE AND SCREEN
ABO/RH(D): O POS
Antibody Screen: NEGATIVE
Unit division: 0
Unit division: 0

## 2024-09-19 LAB — CBC WITH DIFFERENTIAL/PLATELET
Abs Immature Granulocytes: 0.21 K/uL — ABNORMAL HIGH (ref 0.00–0.07)
Basophils Absolute: 0.1 K/uL (ref 0.0–0.1)
Basophils Relative: 2 %
Eosinophils Absolute: 0 K/uL (ref 0.0–0.5)
Eosinophils Relative: 1 %
HCT: 26.4 % — ABNORMAL LOW (ref 36.0–46.0)
Hemoglobin: 8.5 g/dL — ABNORMAL LOW (ref 12.0–15.0)
Immature Granulocytes: 5 %
Lymphocytes Relative: 16 %
Lymphs Abs: 0.8 K/uL (ref 0.7–4.0)
MCH: 28.5 pg (ref 26.0–34.0)
MCHC: 32.2 g/dL (ref 30.0–36.0)
MCV: 88.6 fL (ref 80.0–100.0)
Monocytes Absolute: 0.9 K/uL (ref 0.1–1.0)
Monocytes Relative: 20 %
Neutro Abs: 2.7 K/uL (ref 1.7–7.7)
Neutrophils Relative %: 56 %
Platelets: 142 K/uL — ABNORMAL LOW (ref 150–400)
RBC: 2.98 MIL/uL — ABNORMAL LOW (ref 3.87–5.11)
RDW: 18.6 % — ABNORMAL HIGH (ref 11.5–15.5)
Smear Review: NORMAL
WBC: 4.7 K/uL (ref 4.0–10.5)
nRBC: 0 % (ref 0.0–0.2)

## 2024-09-19 MED ORDER — LORATADINE 10 MG PO TABS: 10.0000 mg | ORAL_TABLET | Freq: Every day | ORAL | Status: DC | PRN

## 2024-09-19 MED ORDER — PROPRANOLOL HCL 10 MG PO TABS: 10.0000 mg | ORAL_TABLET | Freq: Two times a day (BID) | ORAL | Status: DC | PRN

## 2024-09-19 MED ORDER — LEVOFLOXACIN 500 MG PO TABS
500.0000 mg | ORAL_TABLET | Freq: Every day | ORAL | Status: DC
  Administered 2024-09-20 – 2024-09-22 (×3): 500 mg via ORAL
  Filled 2024-09-19 (×3): qty 1

## 2024-09-19 MED ORDER — GABAPENTIN 100 MG PO CAPS
100.0000 mg | ORAL_CAPSULE | Freq: Three times a day (TID) | ORAL | Status: DC
  Administered 2024-09-19 – 2024-09-22 (×10): 100 mg via ORAL
  Filled 2024-09-19 (×10): qty 1

## 2024-09-19 MED ORDER — ASPIRIN 81 MG PO TBEC
81.0000 mg | DELAYED_RELEASE_TABLET | Freq: Every day | ORAL | Status: DC
  Administered 2024-09-19 – 2024-09-22 (×4): 81 mg via ORAL
  Filled 2024-09-19 (×4): qty 1

## 2024-09-19 MED ORDER — LATANOPROST 0.005 % OP SOLN
1.0000 [drp] | Freq: Every day | OPHTHALMIC | Status: DC
  Filled 2024-09-19: qty 2.5

## 2024-09-19 MED ORDER — FUROSEMIDE 20 MG PO TABS
20.0000 mg | ORAL_TABLET | Freq: Every day | ORAL | Status: DC
  Administered 2024-09-19 – 2024-09-22 (×4): 20 mg via ORAL
  Filled 2024-09-19 (×4): qty 1

## 2024-09-19 NOTE — TOC Initial Note (Signed)
 Transition of Care [facility]) - Initial/Assessment Note   Patient Details  Name: Erica [name] MRN: [id number] Date of Birth: [birth year]  [facility] CM/SW Contact:    Duwaine GORMAN Aran, LCSW Phone Number: 09/19/2024, 2:22 PM  Clinical Narrative: PT/OT evaluations recommended SNF. CSW met with niece, Ms. Baynes, and the discharge plan was originally HHPT/OT, so [facility] referrals were faxed out in hub. Niece declined rolling walker as she already has one for the patient to use. However, now the patient will go to SNF. Niece's first choices are [facility] and [facility].  FL2 done; PASRR received. Initial referral faxed out for SNF. Care management awaiting bed offers.  Expected Discharge Plan: Skilled Nursing Facility Barriers to Discharge: Continued Medical Work up, SNF Pending bed offer, Insurance Authorization  Patient Goals and CMS Choice Patient states their goals for this hospitalization and ongoing recovery are:: Go to rehab CMS Medicare.gov Compare Post Acute Care list provided to:: Patient Represenative (must comment) Choice offered to / list presented to :  [name] (niece))  Expected Discharge Plan and Services In-house Referral: Clinical Social Work Post Acute Care Choice: Skilled Nursing Facility Living arrangements for the past 2 months: Single Family Home            DME Arranged: N/A DME Agency: NA  Prior Living Arrangements/Services Living arrangements for the past 2 months: Single Family Home Lives with:: Self Patient language and need for interpreter reviewed:: Yes Do you feel safe going back to the place where you live?: Yes      Need for Family Participation in Patient Care: Yes (Comment) Care giver support system in place?: Yes (comment) Criminal Activity/Legal Involvement Pertinent to Current Situation/Hospitalization: No - Comment as needed  Activities of Daily Living ADL Screening (condition at time of admission) Independently performs  ADLs?: No Does the patient have a NEW difficulty with bathing/dressing/toileting/self-feeding that is expected to last >3 days?: Yes (Initiates electronic notice to provider for possible OT consult) Does the patient have a NEW difficulty with getting in/out of bed, walking, or climbing stairs that is expected to last >3 days?: Yes (Initiates electronic notice to provider for possible PT consult) Does the patient have a NEW difficulty with communication that is expected to last >3 days?: Yes (Initiates electronic notice to provider for possible SLP consult) Is the patient deaf or have difficulty hearing?: No Does the patient have difficulty seeing, even when wearing glasses/contacts?: No Does the patient have difficulty concentrating, remembering, or making decisions?: Yes  Permission Sought/Granted Permission sought to share information with : Facility Industrial/product Designer granted to share info w AGENCY: SNFs  Emotional Assessment Appearance:: Appears stated age Attitude/Demeanor/Rapport: Lethargic   Orientation: : Oriented to Self, Oriented to Place, Oriented to  Time, Oriented to Situation Alcohol / Substance Use: Not Applicable Psych Involvement: No (comment)  Admission diagnosis:  SIRS (systemic inflammatory response syndrome) (HCC) [R65.10] Symptomatic anemia [D64.9] Patient Active Problem List   Diagnosis Date Noted   Chronic myeloid leukemia (HCC) 09/16/2024   Fever of unknown origin 09/16/2024   Esophagitis 09/16/2024   Non-insulin  dependent type 2 diabetes mellitus (HCC) 09/16/2024   Essential hypertension 09/16/2024   Peripheral neuropathy 09/16/2024   Altered mental status 09/16/2024   SIRS (systemic inflammatory response syndrome) (HCC) 09/16/2024   Symptomatic anemia 09/15/2024   Diastolic heart failure (HCC) 12/01/2023   Pleural effusion on right 11/19/2023   Pleural effusion 11/02/2023   PCP:  Arloa Elsie SAUNDERS, MD Pharmacy:  [facility] [place], [place] - [address] [facility] [address] [place] [address] Phone: [phone] Fax: [phone]  Social Drivers of Health (SDOH) Social History: SDOH Screenings   Food Insecurity: No Food Insecurity (11/19/2023)  Housing: Low Risk  (11/19/2023)  Transportation Needs: No Transportation Needs (11/19/2023)  Utilities: Not At Risk (11/19/2023)  Tobacco Use: Low Risk  (09/16/2024)   SDOH Interventions:    Readmission Risk Interventions     No data to display

## 2024-09-19 NOTE — NC FL2 (Signed)
 [place]  MEDICAID FL2 LEVEL OF CARE FORM     IDENTIFICATION  Patient Name: Erica [name] Birthdate: 09/03/1946 Sex: female Admission Date (Current Location): 09/15/2024  [facility] and [place] Number:  Producer, Television/film/video and Address:  [facility],  [address], [place] [address]      Provider Number: [id number]  Attending Physician Name and Address:  Raenelle Coria, MD  Relative Name and Phone Number:  Olivia Floras (niece) Ph: [phone]    Current Level of Care: Hospital Recommended Level of Care: Skilled Nursing Facility Prior Approval Number:    Date Approved/Denied:   PASRR Number: [id number]  Discharge Plan: SNF    Current Diagnoses: Patient Active Problem List   Diagnosis Date Noted   Chronic myeloid leukemia (HCC) 09/16/2024   Fever of unknown origin 09/16/2024   Esophagitis 09/16/2024   Non-insulin  dependent type 2 diabetes mellitus (HCC) 09/16/2024   Essential hypertension 09/16/2024   Peripheral neuropathy 09/16/2024   Altered mental status 09/16/2024   SIRS (systemic inflammatory response syndrome) (HCC) 09/16/2024   Symptomatic anemia 09/15/2024   Diastolic heart failure (HCC) 12/01/2023   Pleural effusion on right 11/19/2023   Pleural effusion 11/02/2023    Orientation RESPIRATION BLADDER Height & Weight     Self, Time, Situation, Place  Normal Incontinent Weight: 160 lb 7.9 oz (72.8 kg) Height:  4' 11 (149.9 cm)  BEHAVIORAL SYMPTOMS/MOOD NEUROLOGICAL BOWEL NUTRITION STATUS      Continent Diet (Heart healthy/carb modified diet)  AMBULATORY STATUS COMMUNICATION OF NEEDS Skin   Extensive Assist Verbally Normal                       Personal Care Assistance Level of Assistance  Bathing, Feeding, Dressing Bathing Assistance: Limited assistance Feeding assistance: Independent Dressing Assistance: Limited assistance     Functional Limitations Info  Sight, Hearing, Speech Sight Info: Impaired Hearing Info:  Adequate Speech Info: Adequate    SPECIAL CARE FACTORS FREQUENCY  PT (By licensed PT), OT (By licensed OT)     PT Frequency: 5x's/week OT Frequency: 5x's/week            Contractures Contractures Info: Not present    Additional Factors Info  Code Status, Allergies Code Status Info: Full Allergies Info: Penicillins, Sulfa Antibiotics           Current Medications (09/19/2024):  This is the current hospital active medication list Current Facility-Administered Medications  Medication Dose Route Frequency Provider Last Rate Last Admin   0.9 %  sodium chloride  infusion (Manually program via Guardrails IV Fluids)   Intravenous Once Sundil, Subrina, MD   Held at 09/16/24 0546   acetaminophen  (TYLENOL ) tablet 650 mg  650 mg Oral Q6H PRN Sundil, Subrina, MD   650 mg at 09/19/24 0351   Or   acetaminophen  (TYLENOL ) suppository 650 mg  650 mg Rectal Q6H PRN Sundil, Subrina, MD       aspirin  EC tablet 81 mg  81 mg Oral Daily Ghimire, Kuber, MD   81 mg at 09/19/24 1206   ceFEPIme (MAXIPIME) 2 g in sodium chloride  0.9 % 100 mL IVPB  2 g Intravenous Q12H Raenelle Coria, MD   Stopped at 09/19/24 9378   feeding supplement (ENSURE PLUS HIGH PROTEIN) liquid 237 mL  237 mL Oral BID BM Raenelle Coria, MD   237 mL at 09/19/24 0933   folic acid (FOLVITE) tablet 2 mg  2 mg Oral Daily Ghimire, Kuber, MD   2 mg at 09/19/24  9066   furosemide  (LASIX ) tablet 20 mg  20 mg Oral Daily Ghimire, Kuber, MD   20 mg at 09/19/24 1206   gabapentin  (NEURONTIN ) capsule 100 mg  100 mg Oral TID Raenelle Coria, MD   100 mg at 09/19/24 1206   latanoprost  (XALATAN ) 0.005 % ophthalmic solution 1 drop  1 drop Both Eyes QHS Raenelle Coria, MD       NOREEN ON 09/20/2024] levofloxacin (LEVAQUIN) tablet 500 mg  500 mg Oral Daily Ghimire, Kuber, MD       loratadine (CLARITIN) tablet 10 mg  10 mg Oral Daily PRN Ghimire, Kuber, MD       ondansetron  (ZOFRAN ) tablet 4 mg  4 mg Oral Q6H PRN Sundil, Subrina, MD       Or    ondansetron  (ZOFRAN ) injection 4 mg  4 mg Intravenous Q6H PRN Sundil, Subrina, MD   4 mg at 09/17/24 1419   potassium chloride  SA (KLOR-CON  M) CR tablet 20 mEq  20 mEq Oral BID Ghimire, Kuber, MD   20 mEq at 09/19/24 9066   propranolol  (INDERAL ) tablet 10 mg  10 mg Oral BID PRN Ghimire, Kuber, MD         Discharge Medications: Please see discharge summary for a list of discharge medications.  Relevant Imaging Results:  Relevant Lab Results:   Additional Information SSN: [id number]  Duwaine GORMAN Aran, LCSW

## 2024-09-19 NOTE — Plan of Care (Signed)
  Problem: Education: Goal: Knowledge of General Education information will improve Description: Including pain rating scale, medication(s)/side effects and non-pharmacologic comfort measures Outcome: Progressing   Problem: Clinical Measurements: Goal: Ability to maintain clinical measurements within normal limits will improve Outcome: Progressing Goal: Diagnostic test results will improve Outcome: Progressing Goal: Respiratory complications will improve Outcome: Progressing Goal: Cardiovascular complication will be avoided Outcome: Progressing   Problem: Activity: Goal: Risk for activity intolerance will decrease Outcome: Progressing   Problem: Nutrition: Goal: Adequate nutrition will be maintained Outcome: Progressing   Problem: Coping: Goal: Level of anxiety will decrease Outcome: Progressing   Problem: Elimination: Goal: Will not experience complications related to urinary retention Outcome: Progressing   Problem: Pain Managment: Goal: General experience of comfort will improve and/or be controlled Outcome: Progressing   Problem: Safety: Goal: Ability to remain free from injury will improve Outcome: Progressing

## 2024-09-19 NOTE — Progress Notes (Signed)
 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  In the emergency room she was found to be febrile with temperature 102, tachypneic.  Blood pressure was stable.  She was on room air.  WBC count was normal.  Hemoglobin 6.3 with recent hemoglobin of 11.6 in December 2024. Skeletal survey including CT head and CT cervical spine was negative. CTA of the chest, abdomen and pelvis with no evidence of PE, small pleural effusion, subsegmental right lung base atelectasis.  Esophageal thickening.  Cultures were drawn.  Started on blood transfusions.  Broad-spectrum antibiotics and admitted to the hospital.  Subjective: Patient seen and examined.  No overnight events.  Afebrile.  Appetite is improving.  Still has occasional left lower quadrant abdominal pain. Patient's niece Rosaline on the phone.  As a family they have agreed for her to go home with home health PT OT.  They will have adequate support system. Will finish IV antibiotics for 5 days tonight, will discharge home with some oral antibiotics and follow-up with her oncologist.   Assessment & Plan:   Sepsis present on admission, source unknown: Presented with fever, tachypnea and borderline hypotension.  Lactic acidosis of more than 4. COVID, RSV and flu negative. CT of the chest with possible right lung base atelectasis or pneumonia. UA was normal. Blood cultures, negative.. Will continue vancomycin and cefepime today not knowing the source of infection.   Levaquin for 5 additional days on discharge.  Acute infective encephalopathy: CT scan without any abnormality.  She does not have any focal deficits.  Mental status normalized.  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  Follows up with oncology at [facility]. Seen by oncologist.  No evidence of hemolysis.  Folic acid was 4.  Adding folic acid. Received 2 units of PRBC with appropriate response. FOBT negative.  No evidence of active bleeding.  No evidence of hemolysis.  Monitor.  Abnormal CT scan of the esophagus: Discussed with gastroenterology.  Suggested recovery and follow-up endoscopies as outpatient.  Patient will need EGD and colonoscopy as outpatient.  Type 2 diabetes: Well-controlled.  On SSI.  Chronic diastolic dysfunction: Euvolemic.  Resume Lasix , gabapentin  today.    DVT prophylaxis: SCDs Start: 09/16/24 0503 Place TED hose Start: 09/16/24 0503   Code Status: Full code Family Communication: Patient's niece Rosaline on the phone. Disposition Plan: Status is: Inpatient Remains inpatient appropriate because: Severe significant illness, Can be transferred to MedSurg with plan for possible discharge home with home health PT OT tomorrow.     Consultants:  Oncology  Procedures:  None  Antimicrobials:  Vancomycin and cefepime 10/24---     Objective: Vitals:   09/18/24 0440 09/18/24 1605 09/18/24 1926 09/19/24 0507  BP: (!) 135/51 (!) 138/55 132/61 (!) 133/58  Pulse: 86 88 87 77  Resp: (!) 24 19 15 17   Temp: 98.5 F (36.9 C) 99.8 F (37.7 C) 98.8 F (37.1 C) 98.9 F (37.2 C)  TempSrc: Oral Oral Oral   SpO2: 98% 96% 99% 98%  Weight:      Height:        Intake/Output Summary (Last 24 hours) at 09/19/2024 1107 Last data filed at 09/19/2024 9641 Gross  per 24 hour  Intake 314.88 ml  Output --  Net 314.88 ml   Filed Weights   09/15/24 1419  Weight: 72.8 kg    Examination:  General exam: Sitting in chair.  Looks comfortable.  On room air. Respiratory system: Clear to auscultation. Respiratory effort normal.  No added sounds. Cardiovascular system: S1 & S2 heard, RRR.   Gastrointestinal system: Soft.  Nontender.  No rigidity or guarding. Central nervous system: Alert and awake.  Mostly oriented.  No focal deficits.    Data Reviewed: I have personally reviewed following labs and imaging studies  CBC: Recent Labs  Lab 09/16/24 0600 09/16/24 1609 09/17/24 0116 09/17/24 0814 09/18/24 0542 09/19/24 0514  WBC 7.6 7.2  --  6.8 4.8 4.7  NEUTROABS  --   --   --  5.3 2.8 2.7  HGB 6.9* 7.9* 9.0* 9.5* 8.8* 8.5*  HCT 21.6* 25.5* 26.7* 28.1* 25.8* 26.4*  MCV 84.0 90.7  --  86.7 87.5 88.6  PLT 279 227  --  189 158 142*   Basic Metabolic Panel: Recent Labs  Lab 09/15/24 1426 09/16/24 0600 09/17/24 0620 09/18/24 0542  NA 132* 134* 135 136  K 4.6 4.1 3.5 3.4*  CL 97* 99 104 106  CO2 25 21* 20* 20*  GLUCOSE 157* 227* 111* 80  BUN 6* 8 8 9   CREATININE 0.67 0.71 0.62 0.62  CALCIUM 9.5 8.8* 7.9* 8.0*  MG  --   --   --  1.8  PHOS  --   --   --  1.8*   GFR: Estimated Creatinine Clearance: 51.1 mL/min (by C-G formula based on SCr of 0.62 mg/dL). Liver Function Tests: Recent Labs  Lab 09/15/24 1426 09/16/24 0600 09/17/24 0620 09/18/24 0542  AST 30 38 33 30  ALT 5 7 5  <5  ALKPHOS 84 76 65 67  BILITOT 0.5 0.5 0.5 0.5  PROT 6.5 6.0* 5.4* 5.1*  ALBUMIN 3.6 3.3* 2.9* 2.7*   No results for input(s): LIPASE, AMYLASE in the last 168 hours. Recent Labs  Lab 09/16/24 0800  AMMONIA 25   Coagulation Profile: Recent Labs  Lab 09/15/24 1638  INR 1.1   Cardiac Enzymes: No results for input(s): CKTOTAL, CKMB, CKMBINDEX, TROPONINI in the last 168 hours. BNP (last 3 results) No results for input(s): PROBNP in the last 8760 hours. HbA1C: No results for input(s): HGBA1C in the last 72 hours. CBG: Recent Labs  Lab 09/15/24 1408  GLUCAP 133*   Lipid Profile: No results for input(s): CHOL, HDL, LDLCALC, TRIG, CHOLHDL, LDLDIRECT in the last 72 hours. Thyroid Function Tests: No results for input(s): TSH, T4TOTAL,  FREET4, T3FREE, THYROIDAB in the last 72 hours.  Anemia Panel: No results for input(s): VITAMINB12, FOLATE, FERRITIN, TIBC, IRON, RETICCTPCT in the last 72 hours.  Sepsis Labs: Recent Labs  Lab 09/15/24 1640 09/15/24 1848 09/16/24 0600 09/16/24 1056  LATICACIDVEN 2.2* 2.1* 4.1* 2.9*    Recent Results (from the past 240 hours)  Resp panel by RT-PCR (RSV, Flu A&B, Covid) Anterior Nasal Swab     Status: None   Collection Time: 09/15/24  2:26 PM   Specimen: Anterior Nasal Swab  Result Value Ref Range Status   SARS Coronavirus 2 by RT PCR NEGATIVE NEGATIVE Final    Comment: (NOTE) SARS-CoV-2 target nucleic acids are NOT DETECTED.  The SARS-CoV-2 RNA is generally detectable in upper respiratory specimens during the acute phase of infection. The lowest concentration of SARS-CoV-2 viral copies this assay can detect is 138  copies/mL. A negative result does not preclude SARS-Cov-2 infection and should not be used as the sole basis for treatment or other patient management decisions. A negative result may occur with  improper specimen collection/handling, submission of specimen other than nasopharyngeal swab, presence of viral mutation(s) within the areas targeted by this assay, and inadequate number of viral copies(<138 copies/mL). A negative result must be combined with clinical observations, patient history, and epidemiological information. The expected result is Negative.  Fact Sheet for Patients:  bloggercourse.com  Fact Sheet for Healthcare Providers:  seriousbroker.it  This test is no t yet approved or cleared by the [place]  FDA and  has been authorized for detection and/or diagnosis of SARS-CoV-2 by FDA under an Emergency Use Authorization (EUA). This EUA will remain  in effect (meaning this test can be used) for the duration of the COVID-19 declaration under Section 564(b)(1) of the Act,  21 U.S.C.section 360bbb-3(b)(1), unless the authorization is terminated  or revoked sooner.       Influenza A by PCR NEGATIVE NEGATIVE Final   Influenza B by PCR NEGATIVE NEGATIVE Final    Comment: (NOTE) The Xpert Xpress SARS-CoV-2/FLU/RSV plus assay is intended as an aid in the diagnosis of influenza from Nasopharyngeal swab specimens and should not be used as a sole basis for treatment. Nasal washings and aspirates are unacceptable for Xpert Xpress SARS-CoV-2/FLU/RSV testing.  Fact Sheet for Patients: bloggercourse.com  Fact Sheet for Healthcare Providers: seriousbroker.it  This test is not yet approved or cleared by the [place]  FDA and has been authorized for detection and/or diagnosis of SARS-CoV-2 by FDA under an Emergency Use Authorization (EUA). This EUA will remain in effect (meaning this test can be used) for the duration of the COVID-19 declaration under Section 564(b)(1) of the Act, 21 U.S.C. section 360bbb-3(b)(1), unless the authorization is terminated or revoked.     Resp Syncytial Virus by PCR NEGATIVE NEGATIVE Final    Comment: (NOTE) Fact Sheet for Patients: bloggercourse.com  Fact Sheet for Healthcare Providers: seriousbroker.it  This test is not yet approved or cleared by the [place]  FDA and has been authorized for detection and/or diagnosis of SARS-CoV-2 by FDA under an Emergency Use Authorization (EUA). This EUA will remain in effect (meaning this test can be used) for the duration of the COVID-19 declaration under Section 564(b)(1) of the Act, 21 U.S.C. section 360bbb-3(b)(1), unless the authorization is terminated or revoked.  Performed at [facility], [address], [place], [place] [address]   Blood culture (routine x 2)     Status: None (Preliminary result)   Collection Time: 09/15/24  4:50 PM   Specimen:  BLOOD  Result Value Ref Range Status   Specimen Description   Final    BLOOD BLOOD LEFT FOREARM Performed at [facility], [address] [place], [place] [address]    Special Requests   Final    BOTTLES DRAWN AEROBIC AND ANAEROBIC Blood Culture adequate volume Performed at [facility], [address], [place], [place] [address]    Culture   Final    NO GROWTH 4 DAYS Performed at [facility], [address] [place], [place] [address]    Report Status PENDING  Incomplete  Blood culture (routine x 2)     Status: None (Preliminary result)   Collection Time: 09/16/24  6:00 AM   Specimen: BLOOD  Result Value Ref Range Status   Specimen Description   Final    BLOOD LEFT ANTECUBITAL Performed  at [facility], [address] [place], [place] [address]    Special Requests   Final    BOTTLES DRAWN AEROBIC AND ANAEROBIC Blood Culture results may not be optimal due to an inadequate volume of blood received in culture bottles Performed at [facility], [address], [place], [place] [address]    Culture   Final    NO GROWTH 3 DAYS Performed at [facility], [address] [place], [place] [address]    Report Status PENDING  Incomplete         Radiology Studies: No results found.       Scheduled Meds:  sodium chloride    Intravenous Once   aspirin  EC  81 mg Oral Daily   feeding supplement  237 mL Oral BID BM   folic acid  2 mg Oral Daily   furosemide   20 mg Oral Daily   gabapentin   100 mg Oral TID   latanoprost   1 drop Both Eyes QHS   [START ON 09/20/2024] levofloxacin  500 mg Oral Daily   potassium chloride   20 mEq Oral BID   Continuous Infusions:  ceFEPime (MAXIPIME) IV 2 g (09/19/24 0548)   vancomycin 5 mL/hr at 09/19/24 0358     LOS: 3 days      Renato Applebaum, MD [facility]

## 2024-09-20 DIAGNOSIS — D649 Anemia, unspecified: Secondary | ICD-10-CM | POA: Diagnosis not present

## 2024-09-20 LAB — CULTURE, BLOOD (ROUTINE X 2)
Culture: NO GROWTH
Special Requests: ADEQUATE

## 2024-09-20 LAB — CBC WITH DIFFERENTIAL/PLATELET
Abs Immature Granulocytes: 0.16 K/uL — ABNORMAL HIGH (ref 0.00–0.07)
Basophils Absolute: 0.1 K/uL (ref 0.0–0.1)
Basophils Relative: 2 %
Eosinophils Absolute: 0 K/uL (ref 0.0–0.5)
Eosinophils Relative: 1 %
HCT: 26.7 % — ABNORMAL LOW (ref 36.0–46.0)
Hemoglobin: 8.9 g/dL — ABNORMAL LOW (ref 12.0–15.0)
Immature Granulocytes: 4 %
Lymphocytes Relative: 23 %
Lymphs Abs: 0.9 K/uL (ref 0.7–4.0)
MCH: 29.4 pg (ref 26.0–34.0)
MCHC: 33.3 g/dL (ref 30.0–36.0)
MCV: 88.1 fL (ref 80.0–100.0)
Monocytes Absolute: 0.8 K/uL (ref 0.1–1.0)
Monocytes Relative: 20 %
Neutro Abs: 2.1 K/uL (ref 1.7–7.7)
Neutrophils Relative %: 50 %
Platelets: 136 K/uL — ABNORMAL LOW (ref 150–400)
RBC: 3.03 MIL/uL — ABNORMAL LOW (ref 3.87–5.11)
RDW: 19.1 % — ABNORMAL HIGH (ref 11.5–15.5)
Smear Review: NORMAL
WBC: 4.1 K/uL (ref 4.0–10.5)
nRBC: 0 % (ref 0.0–0.2)

## 2024-09-20 LAB — COMPREHENSIVE METABOLIC PANEL WITH GFR
ALT: 6 U/L (ref 0–44)
AST: 30 U/L (ref 15–41)
Albumin: 2.6 g/dL — ABNORMAL LOW (ref 3.5–5.0)
Alkaline Phosphatase: 62 U/L (ref 38–126)
Anion gap: 11 (ref 5–15)
BUN: 9 mg/dL (ref 8–23)
CO2: 21 mmol/L — ABNORMAL LOW (ref 22–32)
Calcium: 8.3 mg/dL — ABNORMAL LOW (ref 8.9–10.3)
Chloride: 101 mmol/L (ref 98–111)
Creatinine, Ser: 0.57 mg/dL (ref 0.44–1.00)
GFR, Estimated: >60 mL/min (ref >60)
Glucose, Bld: 71 mg/dL (ref 70–99)
Potassium: 4 mmol/L (ref 3.5–5.1)
Sodium: 133 mmol/L — ABNORMAL LOW (ref 135–145)
Total Bilirubin: 0.5 mg/dL (ref 0.0–1.2)
Total Protein: 5.3 g/dL — ABNORMAL LOW (ref 6.5–8.1)

## 2024-09-20 LAB — PHOSPHORUS: Phosphorus: 1.9 mg/dL — ABNORMAL LOW (ref 2.5–4.6)

## 2024-09-20 LAB — MAGNESIUM: Magnesium: 2 mg/dL (ref 1.7–2.4)

## 2024-09-20 LAB — HAPTOGLOBIN: Haptoglobin: 214 mg/dL (ref 42–346)

## 2024-09-20 MED ORDER — PONATINIB HCL 45 MG PO TABS: 45.0000 mg | ORAL_TABLET | Freq: Every day | ORAL | Status: DC

## 2024-09-20 MED ORDER — PONATINIB HCL 45 MG PO TABS
45.0000 mg | ORAL_TABLET | Freq: Every day | ORAL | Status: DC
  Administered 2024-09-20 – 2024-09-22 (×3): 45 mg via ORAL
  Filled 2024-09-20 (×3): qty 1

## 2024-09-20 MED ORDER — POTASSIUM & SODIUM PHOSPHATES 280-160-250 MG PO PACK
1.0000 | PACK | Freq: Three times a day (TID) | ORAL | Status: DC
Start: 2024-09-20 — End: 2024-09-25
  Administered 2024-09-20 – 2024-09-22 (×6): 1 via ORAL
  Filled 2024-09-20 (×8): qty 1

## 2024-09-20 NOTE — Plan of Care (Signed)
  Problem: Clinical Measurements: Goal: Ability to maintain clinical measurements within normal limits will improve Outcome: Progressing   Problem: Activity: Goal: Risk for activity intolerance will decrease Outcome: Progressing   Problem: Nutrition: Goal: Adequate nutrition will be maintained Outcome: Progressing   Problem: Pain Managment: Goal: General experience of comfort will improve and/or be controlled Outcome: Progressing   Problem: Safety: Goal: Ability to remain free from injury will improve Outcome: Progressing   Problem: Skin Integrity: Goal: Risk for impaired skin integrity will decrease Outcome: Progressing

## 2024-09-20 NOTE — Progress Notes (Signed)
 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  Following all directions and able to give a good Hx but slightly off side comments/level of attention.  Pt c/o that they don't want to help me and c/o this bed is very uncomfortable.   Assisted OOB was difficult.  General bed mobility comments: required increased time and effort esp with self scooting to EOB in which Therapist used bed pad to complete.  Required HOB elevated as well as use of rails. General transfer comment: First assisted from elevated bed to [facility] 1/4 pivot turn NO AD at Mod/Max Assist with 75% VC's on proper hand plcement/hand transfer and turn completion.  Therapist assist with Mod support as well as balance and turn targeting to center of BSC.  Pt was unsteady.  Second, assisted off BSC required assist as well as 75% VC's on proper hand placement to push up vs pull up on walker.  Pt unable to achieve a safe static standing balance so Therapist assisted with peri care. General Gait Details: Very unsteady gait requiring a walker for support as well as Mod Assist and 50% VC's to correct forward flexed posture, proper walker to self diatnce and safety with turns using walker.  Prior to admit, Pt was IND amb with NO AD.  Prior to admit, Pt was living home alone, IND amb without any AD, Driving, shopping and working one day a week at [employer].  Pt will need ST Rehab at SNF to address mobility and functional decline prior to safely returning home.    If plan is discharge home, recommend the following: A  lot of help with walking and/or transfers;A lot of help with bathing/dressing/bathroom;Assistance with cooking/housework;Assist for transportation;Help with stairs or ramp for entrance   Can travel by private vehicle     No  Equipment Recommendations  Rolling walker (2 wheels)    Recommendations for Other Services       Precautions / Restrictions Precautions Precautions: Fall Restrictions Weight Bearing Restrictions Per Provider Order: No     Mobility  Bed Mobility Overal bed mobility: Needs Assistance Bed Mobility: Supine to Sit     Supine to sit: Mod assist, Min assist     General bed mobility comments: required increased time and effort esp with self scooting to EOB in which Therapist used bed pad to complete.  Required HOB elevated as well as use of rails.    Transfers Overall transfer level: Needs assistance Equipment used: Rolling walker (2 wheels), None Transfers: Sit to/from Stand, Bed to chair/wheelchair/BSC Sit to Stand: Min assist, Mod assist Stand pivot transfers: Mod assist         General transfer comment: First assisted from elevated bed to [facility] 1/4 pivot turn NO AD at Mod/Max Assist with 75% VC's on proper hand plcement/hand transfer and turn completion.  Therapist assist with Mod support as well as balance and turn targeting to center of BSC.  Pt was unsteady.  Second, assisted off BSC required assist as well as 75% VC's on proper hand placement to push up vs pull up on walker.  Pt unable to achieve a safe static standing balance so Therapist assisted with  peri care.    Ambulation/Gait Ambulation/Gait assistance: Mod assist Gait Distance (Feet): 38 Feet Assistive device: Rolling walker (2 wheels) Gait Pattern/deviations: Step-to pattern, Step-through pattern, Staggering left, Trunk flexed, Staggering right, Shuffle Gait velocity: decreased     General Gait Details: Very unsteady gait requiring a walker for support as well as Mod Assist and 50% VC's  to correct forward flexed posture, proper walker to self diatnce and safety with turns using walker.  Prior to admit, Pt was IND amb with NO AD.   Stairs             Wheelchair Mobility     Tilt Bed    Modified Rankin (Stroke Patients Only)       Balance                                            Communication Communication Communication: Impaired Factors Affecting Communication: Reduced clarity of speech  Cognition Arousal: Alert     PT - Cognitive impairments: Safety/Judgement, Sequencing                       PT - Cognition Comments: improving alertness and cognition but not yet 100%.  Following all directions and able to give a good Hx but slightly off side comments/level of attention Following commands: Intact Following commands impaired: Follows one step commands with increased time    Cueing    Exercises      General Comments        Pertinent Vitals/Pain Pain Assessment Pain Assessment: Faces Faces Pain Scale: Hurts a little bit Pain Location: general/back/neck c/o about the bed mattress Pain Descriptors / Indicators: Aching, Discomfort, Grimacing Pain Intervention(s): Repositioned, Monitored during session    Home Living                          Prior Function            PT Goals (current goals can now be found in the care plan section) Progress towards PT goals: Progressing toward goals    Frequency    Min 2X/week      PT Plan      Co-evaluation              AM-PAC PT 6 Clicks Mobility   Outcome Measure  Help needed turning from your back to your side while in a flat bed without using bedrails?: A Lot Help needed moving from lying on your back to sitting on the side of a flat bed without using bedrails?: A Lot Help needed moving to and from a bed to a chair (including a wheelchair)?: A Lot Help needed standing up from a chair using your arms (e.g., wheelchair or bedside chair)?:  A Lot Help needed to walk in hospital room?: A Lot Help needed climbing 3-5 steps with a railing? : Total 6 Click Score: 11    End of Session Equipment Utilized During Treatment: Gait belt Activity Tolerance: Patient limited by fatigue Patient left: in chair;with call bell/phone within reach;with chair alarm set;with family/visitor present Nurse Communication: Mobility status PT Visit Diagnosis: Unsteadiness on feet (R26.81);Difficulty in walking, not elsewhere classified (R26.2);Muscle weakness (generalized) (M62.81)     Time: 8881-8853 PT Time Calculation (min) (ACUTE ONLY): 28 min  Charges:    $Gait Training: 8-22 mins $Therapeutic Activity: 8-22 mins  PT General Charges $$ ACUTE PT VISIT: 1 Visit                     Katheryn Leap  PTA Acute  Rehabilitation Services Office M-F          [phone]

## 2024-09-20 NOTE — Progress Notes (Signed)
 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  In the emergency room she was found to be febrile with temperature 102, tachypneic.  Blood pressure was stable.  She was on room air.  WBC count was normal.  Hemoglobin 6.3 with recent hemoglobin of 11.6 in December 2024. Skeletal survey including CT head and CT cervical spine was negative. CTA of the chest, abdomen and pelvis with no evidence of PE, small pleural effusion, subsegmental right lung base atelectasis.  Esophageal thickening.  Cultures were drawn.  Started on blood transfusions.  Broad-spectrum antibiotics and admitted to the hospital. Patient clinically improved.  Could not find any source of infection but ultimately did well.  Now waiting to go to a SNF.  Subjective: Patient seen and examined.  Feels weak otherwise no other complaints.  She is alert awake oriented.  She is planning to make advanced directive and assign her son as healthcare power of attorney and alternatively has her niece.  Patient wants to make sure that she does not have to tell her son about her cancer.  According to the patient, only other person that knows about her cancer is her niece Rosaline.  She does not want us  to tell anybody else.  Agreeable to go to rehab.  Assessment & Plan:   Sepsis present on admission, source unknown: Presented with fever, tachypnea and borderline hypotension.  Lactic acidosis of more than 4. COVID, RSV and flu negative. CT of the chest with possible right lung base atelectasis or pneumonia. UA was normal. Blood cultures, negative.. Received 5 days of vancomycin and cefepime.    Levaquin for 5 additional days.  Will complete 10 days of broad-spectrum  antibiotics not knowing the source on this immunocompromised patient.  Acute infective encephalopathy: CT scan without any abnormality.  She does not have any focal deficits.  Mental status normalized.  Improved.  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  Follows up with oncology at [facility]. Seen by oncologist.  No evidence of hemolysis.  Folic acid was 4.  Adding folic acid. Received 2 units of PRBC with appropriate response.  Hemoglobin is stable since then. FOBT negative.  No evidence of active bleeding.  No evidence of hemolysis.  Monitor. Since her active infection has improved, will resume Ponatinib (patient can bring from home)  Abnormal CT scan of the esophagus: Discussed with gastroenterology.  Suggested recovery and follow-up endoscopies as outpatient.  Patient will need EGD and colonoscopy as outpatient.  Type 2 diabetes: Well-controlled.  On SSI.  Chronic diastolic dysfunction: Euvolemic.  Resumed Lasix , gabapentin  today.   Hypophosphatemia: Will keep on scheduled phosphate.    DVT prophylaxis: SCDs Start: 09/16/24 0503 Place TED hose Start: 09/16/24 0503   Code Status: Full code Family Communication: None today. Disposition Plan: Status is: Inpatient Remains inpatient appropriate because: Medically stable.  Waiting for SNF bed.     Consultants:  Oncology  Procedures:  None  Antimicrobials:  Vancomycin and cefepime 10/24--- 10/27 Levaquin 10/28--     Objective: Vitals:   09/19/24 1355 09/19/24 1950 09/20/24 0519 09/20/24 1043  BP: 128/64 138/61 (!) 139/58   Pulse: 89 91 91   Resp: 18 17 16  Temp: 98.2 F (36.8 C) 99.1 F (37.3 C) 98.9 F (37.2 C)   TempSrc:      SpO2: 99% 98% 98% 99%  Weight:      Height:        Intake/Output Summary (Last 24 hours) at 09/20/2024 1224 Last data filed at 09/20/2024 0519 Gross per 24 hour  Intake 380 ml  Output 1650 ml   Net -1270 ml   Filed Weights   09/15/24 1419  Weight: 72.8 kg    Examination:  General exam: Chronically sick looking but fairly comfortable.  She is very pleasant to interaction. Respiratory system: Clear to auscultation. Respiratory effort normal.  No added sounds. Cardiovascular system: S1 & S2 heard, RRR.  Gastrointestinal system: Soft.  Nontender.  No rigidity or guarding. Central nervous system: Alert and awake.  Mostly oriented.  No focal deficits.    Data Reviewed: I have personally reviewed following labs and imaging studies  CBC: Recent Labs  Lab 09/16/24 1609 09/17/24 0116 09/17/24 0814 09/18/24 0542 09/19/24 0514 09/20/24 0523  WBC 7.2  --  6.8 4.8 4.7 4.1  NEUTROABS  --   --  5.3 2.8 2.7 2.1  HGB 7.9* 9.0* 9.5* 8.8* 8.5* 8.9*  HCT 25.5* 26.7* 28.1* 25.8* 26.4* 26.7*  MCV 90.7  --  86.7 87.5 88.6 88.1  PLT 227  --  189 158 142* 136*   Basic Metabolic Panel: Recent Labs  Lab 09/15/24 1426 09/16/24 0600 09/17/24 0620 09/18/24 0542 09/20/24 0523  NA 132* 134* 135 136 133*  K 4.6 4.1 3.5 3.4* 4.0  CL 97* 99 104 106 101  CO2 25 21* 20* 20* 21*  GLUCOSE 157* 227* 111* 80 71  BUN 6* 8 8 9 9   CREATININE 0.67 0.71 0.62 0.62 0.57  CALCIUM 9.5 8.8* 7.9* 8.0* 8.3*  MG  --   --   --  1.8 2.0  PHOS  --   --   --  1.8* 1.9*   GFR: Estimated Creatinine Clearance: 51.1 mL/min (by C-G formula based on SCr of 0.57 mg/dL). Liver Function Tests: Recent Labs  Lab 09/15/24 1426 09/16/24 0600 09/17/24 0620 09/18/24 0542 09/20/24 0523  AST 30 38 33 30 30  ALT 5 7 5  <5 6  ALKPHOS 84 76 65 67 62  BILITOT 0.5 0.5 0.5 0.5 0.5  PROT 6.5 6.0* 5.4* 5.1* 5.3*  ALBUMIN 3.6 3.3* 2.9* 2.7* 2.6*   No results for input(s): LIPASE, AMYLASE in the last 168 hours. Recent Labs  Lab 09/16/24 0800  AMMONIA 25   Coagulation Profile: Recent Labs  Lab 09/15/24 1638  INR 1.1   Cardiac Enzymes: No results for input(s): CKTOTAL, CKMB, CKMBINDEX, TROPONINI  in the last 168 hours. BNP (last 3 results) No results for input(s): PROBNP in the last 8760 hours. HbA1C: No results for input(s): HGBA1C in the last 72 hours. CBG: Recent Labs  Lab 09/15/24 1408  GLUCAP 133*   Lipid Profile: No results for input(s): CHOL, HDL, LDLCALC, TRIG, CHOLHDL, LDLDIRECT in the last 72 hours. Thyroid Function Tests: No results for input(s): TSH, T4TOTAL, FREET4, T3FREE, THYROIDAB in the last 72 hours.  Anemia Panel: No results for input(s): VITAMINB12, FOLATE, FERRITIN, TIBC, IRON, RETICCTPCT in the last 72 hours.  Sepsis Labs: Recent Labs  Lab 09/15/24 1640 09/15/24 1848 09/16/24 0600 09/16/24 1056  LATICACIDVEN 2.2* 2.1* 4.1* 2.9*    Recent Results (from the past 240 hours)  Resp panel by RT-PCR (RSV, Flu A&B, Covid) Anterior Nasal Swab  Status: None   Collection Time: 09/15/24  2:26 PM   Specimen: Anterior Nasal Swab  Result Value Ref Range Status   SARS Coronavirus 2 by RT PCR NEGATIVE NEGATIVE Final    Comment: (NOTE) SARS-CoV-2 target nucleic acids are NOT DETECTED.  The SARS-CoV-2 RNA is generally detectable in upper respiratory specimens during the acute phase of infection. The lowest concentration of SARS-CoV-2 viral copies this assay can detect is 138 copies/mL. A negative result does not preclude SARS-Cov-2 infection and should not be used as the sole basis for treatment or other patient management decisions. A negative result may occur with  improper specimen collection/handling, submission of specimen other than nasopharyngeal swab, presence of viral mutation(s) within the areas targeted by this assay, and inadequate number of viral copies(<138 copies/mL). A negative result must be combined with clinical observations, patient history, and epidemiological information. The expected result is Negative.  Fact Sheet for Patients:  bloggercourse.com  Fact Sheet for  Healthcare Providers:  seriousbroker.it  This test is no t yet approved or cleared by the [place]  FDA and  has been authorized for detection and/or diagnosis of SARS-CoV-2 by FDA under an Emergency Use Authorization (EUA). This EUA will remain  in effect (meaning this test can be used) for the duration of the COVID-19 declaration under Section 564(b)(1) of the Act, 21 U.S.C.section 360bbb-3(b)(1), unless the authorization is terminated  or revoked sooner.       Influenza A by PCR NEGATIVE NEGATIVE Final   Influenza B by PCR NEGATIVE NEGATIVE Final    Comment: (NOTE) The Xpert Xpress SARS-CoV-2/FLU/RSV plus assay is intended as an aid in the diagnosis of influenza from Nasopharyngeal swab specimens and should not be used as a sole basis for treatment. Nasal washings and aspirates are unacceptable for Xpert Xpress SARS-CoV-2/FLU/RSV testing.  Fact Sheet for Patients: bloggercourse.com  Fact Sheet for Healthcare Providers: seriousbroker.it  This test is not yet approved or cleared by the [place]  FDA and has been authorized for detection and/or diagnosis of SARS-CoV-2 by FDA under an Emergency Use Authorization (EUA). This EUA will remain in effect (meaning this test can be used) for the duration of the COVID-19 declaration under Section 564(b)(1) of the Act, 21 U.S.C. section 360bbb-3(b)(1), unless the authorization is terminated or revoked.     Resp Syncytial Virus by PCR NEGATIVE NEGATIVE Final    Comment: (NOTE) Fact Sheet for Patients: bloggercourse.com  Fact Sheet for Healthcare Providers: seriousbroker.it  This test is not yet approved or cleared by the [place]  FDA and has been authorized for detection and/or diagnosis of SARS-CoV-2 by FDA under an Emergency Use Authorization (EUA). This EUA will remain in effect (meaning this  test can be used) for the duration of the COVID-19 declaration under Section 564(b)(1) of the Act, 21 U.S.C. section 360bbb-3(b)(1), unless the authorization is terminated or revoked.  Performed at [facility], [address], [place], [place] [address]   Blood culture (routine x 2)     Status: None   Collection Time: 09/15/24  4:50 PM   Specimen: BLOOD  Result Value Ref Range Status   Specimen Description   Final    BLOOD BLOOD LEFT FOREARM Performed at [facility], [address], [place], [place] [address]    Special Requests   Final    BOTTLES DRAWN AEROBIC AND ANAEROBIC Blood Culture adequate volume Performed at [facility], [address] [place], [place] [address]    Culture   Final  NO GROWTH 5 DAYS Performed at [facility], [address] [place], [place] [address]    Report Status 09/20/2024 FINAL  Final  Blood culture (routine x 2)     Status: None (Preliminary result)   Collection Time: 09/16/24  6:00 AM   Specimen: BLOOD  Result Value Ref Range Status   Specimen Description   Final    BLOOD LEFT ANTECUBITAL Performed at [facility], [address] [place], [place] [address]    Special Requests   Final    BOTTLES DRAWN AEROBIC AND ANAEROBIC Blood Culture results may not be optimal due to an inadequate volume of blood received in culture bottles Performed at [facility], [address], [place], [place] [address]    Culture   Final    NO GROWTH 4 DAYS Performed at [facility], [address] [place], [place] [address]    Report Status PENDING  Incomplete         Radiology Studies: No results found.       Scheduled Meds:  sodium chloride    Intravenous Once   aspirin  EC  81 mg Oral Daily   feeding supplement  237 mL Oral BID BM   folic acid  2 mg Oral Daily   furosemide   20 mg Oral Daily   gabapentin   100 mg Oral TID    latanoprost   1 drop Both Eyes QHS   levofloxacin  500 mg Oral Daily   PONATinib  HCl  45 mg Oral Daily   potassium & sodium phosphates  1 packet Oral TID WC & HS   Continuous Infusions:     LOS: 4 days      Renato Applebaum, MD [facility]

## 2024-09-20 NOTE — Telephone Encounter (Signed)
 Dr. Perri has spoke to the provider at [facility]

## 2024-09-20 NOTE — Progress Notes (Addendum)
 I have reached out to patient's oncologist Dr. Perri to discuss when patient is appropriate to go back on Ponatinib .  Awaiting callback.  I was able to discuss case with Dr. Perri.  He recommended to resume the medications.

## 2024-09-20 NOTE — Progress Notes (Signed)
 Mobility Specialist Progress Note:   09/20/24 1617  Mobility  Activity Ambulated with assistance  Level of Assistance Minimal assist, patient does 75% or more  Assistive Device Front wheel walker  Distance Ambulated (ft) 45 ft  Activity Response Tolerated fair  Mobility Referral Yes  Mobility visit 1 Mobility  Mobility Specialist Start Time (ACUTE ONLY) 1542  Mobility Specialist Stop Time (ACUTE ONLY) 1610  Mobility Specialist Time Calculation (min) (ACUTE ONLY) 28 min   Pt was received in bed and agreed to mobility. Urine continence towards end of session, returned to bed. Pt needs were met, call bell in reach. Left in room with family and RN.  [employer] - Mobility Specialist

## 2024-09-20 NOTE — TOC Progression Note (Signed)
 Transition of Care [facility]) - Progression Note   Patient Details  Name: Erica [name] MRN: [id number] Date of Birth: [birth year]  Transition [facility] CM/SW Contact  Duwaine GORMAN Aran, LCSW Phone Number: 09/20/2024, 2:04 PM  Clinical Narrative: Patient only received the following bed offer:  [facility] and [facility] [address] [place], [place] [address] [phone] Overall rating ??? Average  CSW updated niece. Niece called CSW and reported she visited the facility and does not want the patient to go there, so she requested the bed search be expanded to [facility]. CSW explained that the bed search can be expanded, but due to patient being here under her [facility] plan it will significantly limit bed offers as most facilities are not in-network with the patient's primary insurance. Bed search expanded.  Expected Discharge Plan: Skilled Nursing Facility Barriers to Discharge: Continued Medical Work up, SNF Pending bed offer, English As A Second Language Teacher  Expected Discharge Plan and Services In-house Referral: Clinical Social Work Post Acute Care Choice: Skilled Nursing Facility Living arrangements for the past 2 months: Single Family Home           DME Arranged: N/A DME Agency: NA  Social Drivers of Health (SDOH) Interventions SDOH Screenings   Food Insecurity: No Food Insecurity (11/19/2023)  Housing: Low Risk  (11/19/2023)  Transportation Needs: No Transportation Needs (11/19/2023)  Utilities: Not At Risk (11/19/2023)  Tobacco Use: Low Risk  (09/16/2024)   Readmission Risk Interventions     No data to display

## 2024-09-20 NOTE — Progress Notes (Addendum)
 Prior-To-Admission Oral Chemotherapy for Treatment of Oncologic Disease   Order noted from Dr. Raenelle to continue prior-to-admission oral chemotherapy regimen of ponatinib .  Procedure Per Pharmacy & Therapeutics Committee Policy: Orders for continuation of home oral chemotherapy for treatment of an oncologic disease will be held unless approved by an oncologist during current admission.    For patients receiving oncology care at [facility], inpatient pharmacist contacts patient's oncologist during regular office hours to review. If earlier review is medically necessary, attending physician consults [facility] on-call oncologist   For patients receiving oncology care outside of [facility], attending physician consults patient's oncologist to review. If this oncologist or their coverage cannot be reached, attending physician consults [facility] on-call oncologist   Oral chemotherapy continuation order is on hold pending oncologist review, Non-[facility] oncologist Dr. Karie Monte from [facility] provides oncology care and should be consulted by attending physician    Osie Iantha SQUIBB 09/20/2024, 12:25 PM   ____________________________________  Adden: Per Dr. Raenelle via [facility] msg, Dr. Monte gave the ok to continue ponatinib  inpatient.   Iantha Osie, PharmD, BCPS 09/20/2024 1:11 PM

## 2024-09-20 NOTE — Telephone Encounter (Signed)
 Talked with Dr Raenelle Coria from [facility] [phone] who reports that Erica [name] was admitted with severe anemia (6.8), dehydration and sepsis. She was treated with broad spectrum antibiotics and is doing much better. She will be discharged to rehab and he asked when to restart ponatinib . I encouraged them to restart now.   Electronically signed by: Karie LITTIE Monte, MD 09/20/2024 1:10 PM

## 2024-09-21 DIAGNOSIS — D649 Anemia, unspecified: Secondary | ICD-10-CM | POA: Diagnosis not present

## 2024-09-21 LAB — CBC WITH DIFFERENTIAL/PLATELET
Abs Immature Granulocytes: 0.11 K/uL — ABNORMAL HIGH (ref 0.00–0.07)
Basophils Absolute: 0 K/uL (ref 0.0–0.1)
Basophils Relative: 1 %
Eosinophils Absolute: 0 K/uL (ref 0.0–0.5)
Eosinophils Relative: 0 %
HCT: 26.6 % — ABNORMAL LOW (ref 36.0–46.0)
Hemoglobin: 8.7 g/dL — ABNORMAL LOW (ref 12.0–15.0)
Immature Granulocytes: 3 %
Lymphocytes Relative: 25 %
Lymphs Abs: 0.8 K/uL (ref 0.7–4.0)
MCH: 28.6 pg (ref 26.0–34.0)
MCHC: 32.7 g/dL (ref 30.0–36.0)
MCV: 87.5 fL (ref 80.0–100.0)
Monocytes Absolute: 0.7 K/uL (ref 0.1–1.0)
Monocytes Relative: 23 %
Neutro Abs: 1.5 K/uL — ABNORMAL LOW (ref 1.7–7.7)
Neutrophils Relative %: 48 %
Platelets: 137 K/uL — ABNORMAL LOW (ref 150–400)
RBC: 3.04 MIL/uL — ABNORMAL LOW (ref 3.87–5.11)
RDW: 19.1 % — ABNORMAL HIGH (ref 11.5–15.5)
Smear Review: NORMAL
WBC: 3.2 K/uL — ABNORMAL LOW (ref 4.0–10.5)
nRBC: 0 % (ref 0.0–0.2)

## 2024-09-21 LAB — CULTURE, BLOOD (ROUTINE X 2): Culture: NO GROWTH

## 2024-09-21 MED ORDER — LEVOFLOXACIN 500 MG PO TABS: 500.0000 mg | ORAL_TABLET | Freq: Every day | ORAL | 0 refills | Status: AC

## 2024-09-21 MED ORDER — TRAMADOL HCL 50 MG PO TABS
25.0000 mg | ORAL_TABLET | Freq: Once | ORAL | Status: AC | PRN
  Administered 2024-09-21: 25 mg via ORAL
  Filled 2024-09-21: qty 1

## 2024-09-21 MED ORDER — POTASSIUM & SODIUM PHOSPHATES 280-160-250 MG PO PACK: 1.0000 | PACK | Freq: Three times a day (TID) | ORAL | 0 refills | Status: AC

## 2024-09-21 MED ORDER — FOLIC ACID 1 MG PO TABS: 1.0000 mg | ORAL_TABLET | Freq: Every day | ORAL | 0 refills | Status: DC

## 2024-09-21 NOTE — Progress Notes (Signed)
 Occupational Therapy Treatment Patient Details Name: Erica [name] MRN: [id number] DOB: 11/07/1946 Today's Date: 09/21/2024   History of present illness 78 year old female presented from home 09/15/24 with dizziness, lightheadedness, fatigue, found febrile, tachypneic., bilateral pleural effusions  PMH: CML on chemotherapy, type 2 diabetes, hypertension hyperlipidemia, peripheral neuropathy , thoracentesis   OT comments  Patient seen for skilled OT session this afternoon. Patient called to place niece on phon speaker phone to discuss potential OT DME needs. Niece confirmed need for RW and shower DME. OT worked with patient with handout for various options ut nice will wait until [facility] assesses her home set up and will order online. See below for current status as patient with improved BADL,  assisted transfer ad RW amb performance this session. Patient requires continued Acute care hospital level OT services to progress safety and functional performance and allow for discharge. Recommending HHOT services with 24/h S and assist from family.        If plan is discharge home, recommend the following:  A little help with walking and/or transfers;A lot of help with bathing/dressing/bathroom;Assistance with cooking/housework   Equipment Recommendations  BSC/3in1;Tub/shower seat;Other (comment) (RW)       Precautions / Restrictions Precautions Precautions: Fall       Mobility Bed Mobility Overal bed mobility: Needs Assistance Bed Mobility: Supine to Sit, Sit to Supine Rolling: Contact guard assist, Used rails Sidelying to sit: Contact guard assist, Used rails Supine to sit: Contact guard, Used rails     General bed mobility comments: min cues and increased time but less phyical assist    Transfers Overall transfer level: Needs assistance Equipment used: Rolling walker (2 wheels), None Transfers: Sit to/from Stand, Bed to chair/wheelchair/BSC Sit to Stand: Contact guard  assist Stand pivot transfers: Min assist         General transfer comment: RW ue to amb to and from bathroom     Balance Overall balance assessment: Needs assistance Sitting-balance support: No upper extremity supported, Feet supported Sitting balance-Leahy Scale: Fair     Standing balance support: Reliant on assistive device for balance, During functional activity, Bilateral upper extremity supported Standing balance-Leahy Scale: Poor                             ADL either performed or assessed with clinical judgement   ADL Overall ADL's : Needs assistance/impaired Eating/Feeding: Set up   Grooming: Wash/dry hands;Wash/dry face;Oral care;Applying deodorant;Set up;Sitting   Upper Body Bathing: Contact guard assist;Sitting   Lower Body Bathing: Moderate assistance;Sit to/from stand   Upper Body Dressing : Contact guard assist;Sitting   Lower Body Dressing: Moderate assistance;Sit to/from stand   Toilet Transfer: Contact guard assist;Rolling walker (2 wheels)   Toileting- Clothing Manipulation and Hygiene: Contact guard assist;Sitting/lateral lean       Functional mobility during ADLs: Rolling walker (2 wheels);Cueing for safety;Minimal assistance General ADL Comments: increased time for all LB tasks    Extremity/Trunk Assessment Upper Extremity Assessment Upper Extremity Assessment: Right hand dominant;Generalized weakness   Lower Extremity Assessment Lower Extremity Assessment: Defer to PT evaluation                 Communication Communication Communication: No apparent difficulties   Cognition Arousal: Alert Behavior During Therapy: Flat affect Cognition: Cognition impaired     Awareness: Intellectual awareness intact, Online awareness impaired Memory impairment (select all impairments): Working memory Attention impairment (select first level of impairment): Selective attention Executive functioning  impairment (select all impairments):  Problem solving, Sequencing, Reasoning OT - Cognition Comments: improved processing this session, patient able to call neice, place on speakerphone to allow OT to inquire on DME needs as now going to her home post d/c                 Following commands: Impaired Following commands impaired: Follows one step commands with increased time      Cueing   Cueing Techniques: Verbal cues, Tactile cues        General Comments no pain, edema, or SOB throughout session    Pertinent Vitals/ Pain       Pain Assessment Pain Assessment: Faces Faces Pain Scale: No hurt   Frequency  Min 2X/week        Progress Toward Goals  OT Goals(current goals can now be found in the care plan section)  Progress towards OT goals: Progressing toward goals  Acute Rehab OT Goals Patient Stated Goal: to go home with my family tomorow OT Goal Formulation: With patient (spoke with family on phone during session to confirm) Time For Goal Achievement: 10/03/24 Potential to Achieve Goals: Good ADL Goals Pt Will Perform Lower Body Bathing: with modified independence;sit to/from stand;sitting/lateral leans Pt Will Perform Lower Body Dressing: with modified independence;sit to/from stand;sitting/lateral leans Pt Will Transfer to Toilet: with modified independence;ambulating Pt Will Perform Toileting - Clothing Manipulation and hygiene: with modified independence;sit to/from stand;sitting/lateral leans Additional ADL Goal #1: Pt to demonstrate ability to sequence/initiate all ADL tasks appropriately without verbal cues  Plan         AM-PAC OT 6 Clicks Daily Activity     Outcome Measure   Help from another person eating meals?: None Help from another person taking care of personal grooming?: A Little Help from another person toileting, which includes using toliet, bedpan, or urinal?: A Little Help from another person bathing (including washing, rinsing, drying)?: A Lot Help from another person to put  on and taking off regular upper body clothing?: A Little Help from another person to put on and taking off regular lower body clothing?: A Lot 6 Click Score: 17    End of Session Equipment Utilized During Treatment: Gait belt;Rolling walker (2 wheels)  OT Visit Diagnosis: Unsteadiness on feet (R26.81);Other abnormalities of gait and mobility (R26.89);Muscle weakness (generalized) (M62.81)   Activity Tolerance Patient tolerated treatment well   Patient Left in bed   Nurse Communication Mobility status        Time: 8587-8565 OT Time Calculation (min): 22 min  Charges: OT General Charges $OT Visit: 1 Visit OT Treatments $Self Care/Home Management : 8-22 mins Finley Chevez OT/L Acute Rehabilitation Department  [phone] ] 09/21/2024, 3:01 PM

## 2024-09-21 NOTE — TOC Transition Note (Signed)
 Transition of Care [facility]) - Discharge Note  Patient Details  Name: Erica [name] MRN: [id number] Date of Birth: 1946/11/22  Transition of Care [facility]) CM/SW Contact:  Duwaine GORMAN Aran, LCSW Phone Number: 09/21/2024, 11:06 AM  Clinical Narrative: Patient did not receive any bed offers for [name]. CSW notified niece and patient will now go home with HHPT/OT. Niece confirmed she would prefer patient to have the highest rated HH agency, which is [facility]. CSW accepted offer from [facility] in hub. HH orders placed by hospitalist. Care management signing off.  Final next level of care: Home w Home Health Services Barriers to Discharge: Barriers Resolved  Patient Goals and CMS Choice Patient states their goals for this hospitalization and ongoing recovery are:: Go to rehab CMS Medicare.gov Compare Post Acute Care list provided to:: Patient Represenative (must comment) Choice offered to / list presented to :  [name] (niece))  Discharge Plan and Services Additional resources added to the After Visit Summary for   In-house Referral: Clinical Social Work Post Acute Care Choice: Skilled Nursing Facility          DME Arranged: N/A DME Agency: NA HH Arranged: PT, OT HH Agency: [facility] Date HH Agency Contacted: 09/21/24 Representative spoke with at [facility] Agency: Referral made in hub  Social Drivers of Health (SDOH) Interventions SDOH Screenings   Food Insecurity: No Food Insecurity (11/19/2023)  Housing: Low Risk  (11/19/2023)  Transportation Needs: No Transportation Needs (11/19/2023)  Utilities: Not At Risk (11/19/2023)  Tobacco Use: Low Risk  (09/16/2024)   Readmission Risk Interventions     No data to display

## 2024-09-21 NOTE — Discharge Summary (Signed)
 Physician Discharge Summary  Erica [name] FMW:[id number] DOB: 11-14-1946 DOA: 09/15/2024  PCP: Arloa Elsie SAUNDERS, MD  Admit date: 09/15/2024 Discharge date: 09/21/2024  Admitted From: Home Disposition: Home with home health  Recommendations for Outpatient Follow-up:  Follow up with PCP in 1-2 weeks Please obtain BMP/CBC/magnesium/phosphorus in one week Schedule follow-up with your oncologist  Home Health: PT/OT Equipment/Devices: Available at home  Discharge Condition: Stable CODE STATUS: Full code Diet recommendation: Regular diet, nutritional supplements  Discharge summary: 78 year old with 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  In the emergency room she was found to be febrile with temperature 102, tachypneic.  Blood pressure was stable.  She was on room air.  WBC count was normal.  Hemoglobin 6.3 with recent hemoglobin of 11.6 in December 2024. Skeletal survey including CT head and CT cervical spine was negative. CTA of the chest, abdomen and pelvis with no evidence of PE, small pleural effusion, subsegmental right lung base atelectasis.  Esophageal thickening.  Cultures were drawn.  Started on blood transfusions.  Broad-spectrum antibiotics and admitted to the hospital. Patient clinically improved.  Could not find any source of infection but ultimately did well.  Initially planned to go to a skilled nursing facility, however did gradual clinical recovery and has good support system at home so wants to go home with home health.  # Sepsis present on admission, source unknown: Presented with fever, tachypnea and borderline hypotension.  Lactic acidosis of more than 4. COVID, RSV and flu negative. CT of the chest with possible right lung base atelectasis or pneumonia. UA was normal. Blood cultures, negative.. Received 5 days of vancomycin  and cefepime.    Levaquin for 5 additional days.  Will complete 10 days of broad-spectrum antibiotics not knowing the source on this immunocompromised patient.   Acute infective encephalopathy: CT scan without any abnormality.  She does not have any focal deficits.  Mental status normalized.  Improved.   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  Follows up with oncology at [facility]. Seen by oncologist.  No evidence of hemolysis.  Folic acid was 4.  Adding folic acid. Received 2 units of PRBC with appropriate response.  Hemoglobin is stable since then. FOBT negative.  No evidence of active bleeding.  No evidence of hemolysis.  Monitor. Since her active infection has improved, will resume Ponatinib (this was discussed with her primary oncologist Dr. Perri)    Abnormal CT scan of the esophagus: Discussed with gastroenterology.  Suggested recovery and follow-up endoscopies as outpatient.  Patient will need EGD and colonoscopy as outpatient.   Type 2 diabetes: Diet controlled at home.  Currently does not have an indication for treatment.   Chronic diastolic dysfunction: Euvolemic.  Resume low-dose Lasix .   Hypophosphatemia: Will discharge on 5 days of phosphate replacement.   Patient has adequately stabilized to discharge with family and outpatient follow-up.    Discharge Diagnoses:  Principal Problem:   Symptomatic anemia Active Problems:   Chronic myeloid leukemia (HCC)   Esophagitis   Altered mental status   Fever of unknown origin   Non-insulin  dependent type 2 diabetes mellitus (HCC)   Essential hypertension   Peripheral neuropathy   SIRS (systemic inflammatory response syndrome) [facility])    Discharge Instructions  Discharge Instructions     Diet general   Complete by: As  directed    Increase activity slowly   Complete by: As directed       Allergies as of 09/21/2024        Reactions   Penicillins Hives   Sulfa Antibiotics Hives        Medication List     TAKE these medications    allopurinol  300 MG tablet Commonly known as: ZYLOPRIM  Take 1 tablet by mouth daily.   aspirin  EC 81 MG tablet Take 81 mg by mouth daily. Swallow whole.   folic acid 1 MG tablet Commonly known as: FOLVITE Take 1 tablet (1 mg total) by mouth daily. Start taking on: September 22, 2024   furosemide  20 MG tablet Commonly known as: LASIX  Take 20 mg by mouth daily.   gabapentin  100 MG capsule Commonly known as: NEURONTIN  Take 100 mg by mouth 3 (three) times daily.   latanoprost  0.005 % ophthalmic solution Commonly known as: XALATAN  Place 1 drop into both eyes at bedtime. BRAND NAME   levofloxacin 500 MG tablet Commonly known as: LEVAQUIN Take 1 tablet (500 mg total) by mouth daily for 3 days. Start taking on: September 22, 2024   loratadine 10 MG tablet Commonly known as: CLARITIN Take 10 mg by mouth daily as needed for allergies.   PONATinib  HCl 45 MG tablet Commonly known as: ICLUSIG  Take 45 mg by mouth daily.   potassium & sodium phosphates 280-160-250 MG Pack Commonly known as: PHOS-NAK Take 1 packet by mouth 4 (four) times daily -  with meals and at bedtime for 5 days.   propranolol  10 MG tablet Commonly known as: INDERAL  Take 10 mg by mouth 2 (two) times daily as needed (palpitations).   traMADol  50 MG tablet Commonly known as: ULTRAM  Take 50 mg by mouth every 6 (six) hours as needed for moderate pain.   triamcinolone cream 0.1 % Commonly known as: KENALOG Apply 1 application  topically 2 (two) times daily as needed (skin irritation).        Allergies  Allergen Reactions   Penicillins Hives   Sulfa Antibiotics Hives    Consultations: Oncology   Procedures/Studies: CT Angio Chest PE W and/or Wo Contrast Result Date: 09/15/2024 CLINICAL DATA:  Dizziness and fatigue. Concern for pulmonary embolism. Abdominal pain. EXAM: CT ANGIOGRAPHY  CHEST CT ABDOMEN AND PELVIS WITH CONTRAST TECHNIQUE: Multidetector CT imaging of the chest was performed using the standard protocol during bolus administration of intravenous contrast. Multiplanar CT image reconstructions and MIPs were obtained to evaluate the vascular anatomy. Multidetector CT imaging of the abdomen and pelvis was performed using the standard protocol during bolus administration of intravenous contrast. RADIATION DOSE REDUCTION: This exam was performed according to the departmental dose-optimization program which includes automated exposure control, adjustment of the mA and/or kV according to patient size and/or use of iterative reconstruction technique. CONTRAST:  OMNIPAQUE IOHEXOL 350 MG/ML SOLN COMPARISON:  CT abdomen pelvis dated 08/02/2024 FINDINGS: Evaluation of this exam is limited due to respiratory motion and streak artifact caused by patient's arms. CTA CHEST FINDINGS Cardiovascular: There is no cardiomegaly or pericardial effusion. The thoracic aorta is unremarkable. The origins of the great vessels of the aortic arch are patent. Evaluation of the pulmonary arteries is limited due to respiratory motion. No central pulmonary artery embolus identified. Mediastinum/Nodes: No hilar or mediastinal adenopathy. Mild thickened appearance of the esophagus may be related to underdistention. Esophagitis or underlying esophageal mass is not excluded. Clinical correlation is recommended. Lungs/Pleura: Small right pleural effusion. Subsegmental right lung base  atelectasis. Pneumonia is not excluded. There is no pneumothorax. The central airways are patent. Musculoskeletal: Degenerative changes of the spine. No acute osseous pathology. Review of the MIP images confirms the above findings. CT ABDOMEN and PELVIS FINDINGS No intra-abdominal free air or free fluid. Hepatobiliary: Similar appearance of faint hypodense lesions in the left lobe of the liver not characterized on this CT. This can be  better evaluated with ultrasound or MRI on a nonemergent/outpatient basis. No biliary dilatation. No calcified gallstone. Pancreas: The pancreas is suboptimally evaluated due to respiratory motion but grossly unremarkable. Spleen: Normal in size without focal abnormality. Adrenals/Urinary Tract: The adrenal glands unremarkable. The kidneys, and urinary bladder unremarkable. Stomach/Bowel: There is no bowel obstruction or active inflammation. The appendix is normal. Vascular/Lymphatic: Mild aortoiliac atherosclerotic disease. The IVC is unremarkable. No portal venous gas. Indeterminate 1.8 x 2.9 cm infiltrative tissue in the portacaval region (20/4) of indeterminate etiology but suspicious for adenopathy or retroperitoneal fibrosis or a neoplastic process. This can be better evaluated with MRI without and with contrast on a nonemergent basis. Reproductive: The uterus is grossly unremarkable. No suspicious adnexal masses. Other: None Musculoskeletal: Degenerative changes of the spine and hips. No acute osseous pathology. IMPRESSION: 1. No CT evidence of central pulmonary artery embolus. 2. Small right pleural effusion with subsegmental right lung base atelectasis. Pneumonia is not excluded. 3. Mild thickened appearance of the esophagus may be related to underdistention. Esophagitis or underlying esophageal mass is not excluded. Endoscopy may provide better evaluation. 4. No bowel obstruction. Normal appendix. 5. Indeterminate 1.8 x 2.9 cm infiltrative tissue in the portacaval region of indeterminate etiology but suspicious for adenopathy or retroperitoneal fibrosis or a neoplastic process. This can be better evaluated with MRI without and with contrast on a nonemergent basis. 6.  Aortic Atherosclerosis (ICD10-I70.0). Electronically Signed   By: Vanetta Chou M.D.   On: 09/15/2024 20:54   CT ABDOMEN PELVIS W CONTRAST Result Date: 09/15/2024 CLINICAL DATA:  Dizziness and fatigue. Concern for pulmonary embolism.  Abdominal pain. EXAM: CT ANGIOGRAPHY CHEST CT ABDOMEN AND PELVIS WITH CONTRAST TECHNIQUE: Multidetector CT imaging of the chest was performed using the standard protocol during bolus administration of intravenous contrast. Multiplanar CT image reconstructions and MIPs were obtained to evaluate the vascular anatomy. Multidetector CT imaging of the abdomen and pelvis was performed using the standard protocol during bolus administration of intravenous contrast. RADIATION DOSE REDUCTION: This exam was performed according to the departmental dose-optimization program which includes automated exposure control, adjustment of the mA and/or kV according to patient size and/or use of iterative reconstruction technique. CONTRAST:  OMNIPAQUE IOHEXOL 350 MG/ML SOLN COMPARISON:  CT abdomen pelvis dated 08/02/2024 FINDINGS: Evaluation of this exam is limited due to respiratory motion and streak artifact caused by patient's arms. CTA CHEST FINDINGS Cardiovascular: There is no cardiomegaly or pericardial effusion. The thoracic aorta is unremarkable. The origins of the great vessels of the aortic arch are patent. Evaluation of the pulmonary arteries is limited due to respiratory motion. No central pulmonary artery embolus identified. Mediastinum/Nodes: No hilar or mediastinal adenopathy. Mild thickened appearance of the esophagus may be related to underdistention. Esophagitis or underlying esophageal mass is not excluded. Clinical correlation is recommended. Lungs/Pleura: Small right pleural effusion. Subsegmental right lung base atelectasis. Pneumonia is not excluded. There is no pneumothorax. The central airways are patent. Musculoskeletal: Degenerative changes of the spine. No acute osseous pathology. Review of the MIP images confirms the above findings. CT ABDOMEN and PELVIS FINDINGS No intra-abdominal free air  or free fluid. Hepatobiliary: Similar appearance of faint hypodense lesions in the left lobe of the liver not  characterized on this CT. This can be better evaluated with ultrasound or MRI on a nonemergent/outpatient basis. No biliary dilatation. No calcified gallstone. Pancreas: The pancreas is suboptimally evaluated due to respiratory motion but grossly unremarkable. Spleen: Normal in size without focal abnormality. Adrenals/Urinary Tract: The adrenal glands unremarkable. The kidneys, and urinary bladder unremarkable. Stomach/Bowel: There is no bowel obstruction or active inflammation. The appendix is normal. Vascular/Lymphatic: Mild aortoiliac atherosclerotic disease. The IVC is unremarkable. No portal venous gas. Indeterminate 1.8 x 2.9 cm infiltrative tissue in the portacaval region (20/4) of indeterminate etiology but suspicious for adenopathy or retroperitoneal fibrosis or a neoplastic process. This can be better evaluated with MRI without and with contrast on a nonemergent basis. Reproductive: The uterus is grossly unremarkable. No suspicious adnexal masses. Other: None Musculoskeletal: Degenerative changes of the spine and hips. No acute osseous pathology. IMPRESSION: 1. No CT evidence of central pulmonary artery embolus. 2. Small right pleural effusion with subsegmental right lung base atelectasis. Pneumonia is not excluded. 3. Mild thickened appearance of the esophagus may be related to underdistention. Esophagitis or underlying esophageal mass is not excluded. Endoscopy may provide better evaluation. 4. No bowel obstruction. Normal appendix. 5. Indeterminate 1.8 x 2.9 cm infiltrative tissue in the portacaval region of indeterminate etiology but suspicious for adenopathy or retroperitoneal fibrosis or a neoplastic process. This can be better evaluated with MRI without and with contrast on a nonemergent basis. 6.  Aortic Atherosclerosis (ICD10-I70.0). Electronically Signed   By: Vanetta Chou M.D.   On: 09/15/2024 20:54   CT Head Wo Contrast Result Date: 09/15/2024 CLINICAL DATA:  Poly trauma fall dizzy  EXAM: CT HEAD WITHOUT CONTRAST CT CERVICAL SPINE WITHOUT CONTRAST TECHNIQUE: Multidetector CT imaging of the head and cervical spine was performed following the standard protocol without intravenous contrast. Multiplanar CT image reconstructions of the cervical spine were also generated. RADIATION DOSE REDUCTION: This exam was performed according to the departmental dose-optimization program which includes automated exposure control, adjustment of the mA and/or kV according to patient size and/or use of iterative reconstruction technique. COMPARISON:  Radiograph 08/18/2018, CT brain 09/13/2024 FINDINGS: CT HEAD FINDINGS Brain: No acute territorial infarction, hemorrhage or intracranial mass. Mild atrophy. Stable ventricle size. Partially empty sella Vascular: No hyperdense vessels.  Carotid vascular calcification. Skull: Normal. Negative for fracture or focal lesion. Sinuses/Orbits: No acute finding. Other: None CT CERVICAL SPINE FINDINGS Alignment: Straightening of the cervical spine. No subluxation. Facet alignment is normal Skull base and vertebrae: No acute fracture. No primary bone lesion or focal pathologic process. Soft tissues and spinal canal: No prevertebral fluid or swelling. No visible canal hematoma. Disc levels: Moderate severe diffuse disc space narrowing C3 through T1. No high-grade canal stenosis Upper chest: Negative. Other: None IMPRESSION: 1. No CT evidence for acute intracranial abnormality. Mild atrophy. 2. Straightening of the cervical spine with degenerative changes. No acute osseous abnormality. Electronically Signed   By: Luke Bun M.D.   On: 09/15/2024 17:19   CT Cervical Spine Wo Contrast Result Date: 09/15/2024 CLINICAL DATA:  Poly trauma fall dizzy EXAM: CT HEAD WITHOUT CONTRAST CT CERVICAL SPINE WITHOUT CONTRAST TECHNIQUE: Multidetector CT imaging of the head and cervical spine was performed following the standard protocol without intravenous contrast. Multiplanar CT image  reconstructions of the cervical spine were also generated. RADIATION DOSE REDUCTION: This exam was performed according to the departmental dose-optimization program which includes automated exposure  control, adjustment of the mA and/or kV according to patient size and/or use of iterative reconstruction technique. COMPARISON:  Radiograph 08/18/2018, CT brain 09/13/2024 FINDINGS: CT HEAD FINDINGS Brain: No acute territorial infarction, hemorrhage or intracranial mass. Mild atrophy. Stable ventricle size. Partially empty sella Vascular: No hyperdense vessels.  Carotid vascular calcification. Skull: Normal. Negative for fracture or focal lesion. Sinuses/Orbits: No acute finding. Other: None CT CERVICAL SPINE FINDINGS Alignment: Straightening of the cervical spine. No subluxation. Facet alignment is normal Skull base and vertebrae: No acute fracture. No primary bone lesion or focal pathologic process. Soft tissues and spinal canal: No prevertebral fluid or swelling. No visible canal hematoma. Disc levels: Moderate severe diffuse disc space narrowing C3 through T1. No high-grade canal stenosis Upper chest: Negative. Other: None IMPRESSION: 1. No CT evidence for acute intracranial abnormality. Mild atrophy. 2. Straightening of the cervical spine with degenerative changes. No acute osseous abnormality. Electronically Signed   By: Luke Bun M.D.   On: 09/15/2024 17:19   DG Chest Portable 1 View Result Date: 09/15/2024 EXAM: 1 VIEW(S) XRAY OF THE CHEST 09/15/2024 03:23:07 PM COMPARISON: 07/21/2024 CLINICAL HISTORY: fever FINDINGS: LUNGS AND PLEURA: Low lung volumes. No focal pulmonary opacity. No pulmonary edema. No pleural effusion. No pneumothorax. HEART AND MEDIASTINUM: No acute abnormality of the cardiac and mediastinal silhouettes. BONES AND SOFT TISSUES: Bilateral shoulder degenerative changes. Thoracic spondylosis. IMPRESSION: 1. No acute cardiopulmonary process. 2. Low lung volumes. Electronically signed by:  Waddell Calk MD 09/15/2024 03:51 PM EDT RP Workstation: [id number]   CT HEAD WO CONTRAST ( ) Result Date: 09/13/2024 EXAM: CT HEAD WITHOUT CONTRAST 09/13/2024 01:19:00 PM TECHNIQUE: CT of the head was performed without the administration of intravenous contrast. Automated exposure control, iterative reconstruction, and/or weight based adjustment of the mA/kV was utilized to reduce the radiation dose to as low as reasonably achievable. COMPARISON: None available. CLINICAL HISTORY: Pt fell 1 day ago dizziness ; No hx of strokes or seizures. FINDINGS: BRAIN AND VENTRICLES: No acute hemorrhage. No evidence of acute infarct. Mild cerebral volume loss with ex vacuo ventricular dilatation. Partial empty sella. No extra-axial collection. No mass effect or midline shift. ORBITS: Status post bilateral lens replacements. SINUSES: No acute abnormality. SOFT TISSUES AND SKULL: No acute soft tissue abnormality. Hyperostosis frontalis. No skull fracture. IMPRESSION: 1. No acute intracranial abnormality related to the reported fall and dizziness. 2. Mild cerebral volume loss with ex vacuo ventricular dilatation and partial empty sella. Electronically signed by: Franky Stanford MD 09/13/2024 01:33 PM EDT RP Workstation: [id number]   (Echo, Carotid, EGD, Colonoscopy, ERCP)    Subjective: Patient seen and examined.  No overnight events.  She has slight weakness but no other complaints.  She is eating well now.  Patient is very motivated.  This time, when her son comes home she is going to tell him about her diagnosis of CML and she will also do healthcare power of attorney paperwork's.   Discharge Exam: Vitals:   09/20/24 2034 09/21/24 0401  BP: 137/72 (!) 140/70  Pulse: 90 89  Resp: 20 16  Temp: 99.1 F (37.3 C) 98.4 F (36.9 C)  SpO2: 100% 98%   Vitals:   09/20/24 1043 09/20/24 1448 09/20/24 2034 09/21/24 0401  BP:  (!) 120/59 137/72 (!) 140/70  Pulse:  98 90 89  Resp:  18 20 16   Temp:  98.3 F (36.8 C)  99.1 F (37.3 C) 98.4 F (36.9 C)  TempSrc:  Oral Oral Oral  SpO2: 99% 99% 100% 98%  Weight:  Height:        General: Pt is alert, awake, not in acute distress.  Pleasant interactive.  Frail.  Not in any distress.  On room air. Cardiovascular: RRR, S1/S2 +, no rubs, no gallops Respiratory: CTA bilaterally, no wheezing, no rhonchi Abdominal: Soft, NT, ND, bowel sounds + Extremities: no edema, no cyanosis    The results of significant diagnostics from this hospitalization (including imaging, microbiology, ancillary and laboratory) are listed below for reference.     Microbiology: Recent Results (from the past 240 hours)  Resp panel by RT-PCR (RSV, Flu A&B, Covid) Anterior Nasal Swab     Status: None   Collection Time: 09/15/24  2:26 PM   Specimen: Anterior Nasal Swab  Result Value Ref Range Status   SARS Coronavirus 2 by RT PCR NEGATIVE NEGATIVE Final    Comment: (NOTE) SARS-CoV-2 target nucleic acids are NOT DETECTED.  The SARS-CoV-2 RNA is generally detectable in upper respiratory specimens during the acute phase of infection. The lowest concentration of SARS-CoV-2 viral copies this assay can detect is 138 copies/mL. A negative result does not preclude SARS-Cov-2 infection and should not be used as the sole basis for treatment or other patient management decisions. A negative result may occur with  improper specimen collection/handling, submission of specimen other than nasopharyngeal swab, presence of viral mutation(s) within the areas targeted by this assay, and inadequate number of viral copies(<138 copies/mL). A negative result must be combined with clinical observations, patient history, and epidemiological information. The expected result is Negative.  Fact Sheet for Patients:  bloggercourse.com  Fact Sheet for Healthcare Providers:  seriousbroker.it  This test is no t yet approved or cleared by the [place]  [place] FDA and  has been authorized for detection and/or diagnosis of SARS-CoV-2 by FDA under an Emergency Use Authorization (EUA). This EUA will remain  in effect (meaning this test can be used) for the duration of the COVID-19 declaration under Section 564(b)(1) of the Act, 21 U.S.C.section 360bbb-3(b)(1), unless the authorization is terminated  or revoked sooner.       Influenza A by PCR NEGATIVE NEGATIVE Final   Influenza B by PCR NEGATIVE NEGATIVE Final    Comment: (NOTE) The Xpert Xpress SARS-CoV-2/FLU/RSV plus assay is intended as an aid in the diagnosis of influenza from Nasopharyngeal swab specimens and should not be used as a sole basis for treatment. Nasal washings and aspirates are unacceptable for Xpert Xpress SARS-CoV-2/FLU/RSV testing.  Fact Sheet for Patients: bloggercourse.com  Fact Sheet for Healthcare Providers: seriousbroker.it  This test is not yet approved or cleared by the [place]  FDA and has been authorized for detection and/or diagnosis of SARS-CoV-2 by FDA under an Emergency Use Authorization (EUA). This EUA will remain in effect (meaning this test can be used) for the duration of the COVID-19 declaration under Section 564(b)(1) of the Act, 21 U.S.C. section 360bbb-3(b)(1), unless the authorization is terminated or revoked.     Resp Syncytial Virus by PCR NEGATIVE NEGATIVE Final    Comment: (NOTE) Fact Sheet for Patients: bloggercourse.com  Fact Sheet for Healthcare Providers: seriousbroker.it  This test is not yet approved or cleared by the [place]  FDA and has been authorized for detection and/or diagnosis of SARS-CoV-2 by FDA under an Emergency Use Authorization (EUA). This EUA will remain in effect (meaning this test can be used) for the duration of the COVID-19 declaration under Section 564(b)(1) of the Act, 21 U.S.C. section  360bbb-3(b)(1), unless the authorization is terminated or revoked.  Performed  at [facility], [address], [place], [place] [address]   Blood culture (routine x 2)     Status: None   Collection Time: 09/15/24  4:50 PM   Specimen: BLOOD  Result Value Ref Range Status   Specimen Description   Final    BLOOD BLOOD LEFT FOREARM Performed at [facility], [address], [place], [place] [address]    Special Requests   Final    BOTTLES DRAWN AEROBIC AND ANAEROBIC Blood Culture adequate volume Performed at [facility], [address] [place], [place] [address]    Culture   Final    NO GROWTH 5 DAYS Performed at [facility], [address] [place], [place] [address]    Report Status 09/20/2024 FINAL  Final  Blood culture (routine x 2)     Status: None   Collection Time: 09/16/24  6:00 AM   Specimen: BLOOD  Result Value Ref Range Status   Specimen Description   Final    BLOOD LEFT ANTECUBITAL Performed at [facility] [facility], [address], [place], [place] [address]    Special Requests   Final    BOTTLES DRAWN AEROBIC AND ANAEROBIC Blood Culture results may not be optimal due to an inadequate volume of blood received in culture bottles Performed at [name], [address] [place], [place] [address]    Culture   Final    NO GROWTH 5 DAYS Performed at [facility], [address] [place], [place] [address]    Report Status 09/21/2024 FINAL  Final     Labs: BNP (last 3 results) Recent Labs    11/19/23 1438  BNP 107.4*   Basic Metabolic Panel: Recent Labs  Lab 09/15/24 1426 09/16/24 0600 09/17/24 0620 09/18/24 0542 09/20/24 0523  NA 132* 134* 135 136 133*  K 4.6 4.1 3.5 3.4* 4.0  CL 97* 99 104 106 101  CO2 25 21* 20* 20* 21*  GLUCOSE 157* 227* 111* 80 71  BUN 6* 8 8 9 9   CREATININE 0.67 0.71 0.62 0.62 0.57  CALCIUM 9.5 8.8* 7.9* 8.0* 8.3*  MG  --    --   --  1.8 2.0  PHOS  --   --   --  1.8* 1.9*   Liver Function Tests: Recent Labs  Lab 09/15/24 1426 09/16/24 0600 09/17/24 0620 09/18/24 0542 09/20/24 0523  AST 30 38 33 30 30  ALT 5 7 5  <5 6  ALKPHOS 84 76 65 67 62  BILITOT 0.5 0.5 0.5 0.5 0.5  PROT 6.5 6.0* 5.4* 5.1* 5.3*  ALBUMIN 3.6 3.3* 2.9* 2.7* 2.6*   No results for input(s): LIPASE, AMYLASE in the last 168 hours. Recent Labs  Lab 09/16/24 0800  AMMONIA 25   CBC: Recent Labs  Lab 09/17/24 0814 09/18/24 0542 09/19/24 0514 09/20/24 0523 09/21/24 0552  WBC 6.8 4.8 4.7 4.1 3.2*  NEUTROABS 5.3 2.8 2.7 2.1 1.5*  HGB 9.5* 8.8* 8.5* 8.9* 8.7*  HCT 28.1* 25.8* 26.4* 26.7* 26.6*  MCV 86.7 87.5 88.6 88.1 87.5  PLT 189 158 142* 136* 137*   Cardiac Enzymes: No results for input(s): CKTOTAL, CKMB, CKMBINDEX, TROPONINI in the last 168 hours. BNP: Invalid input(s): POCBNP CBG: Recent Labs  Lab 09/15/24 1408  GLUCAP 133*   D-Dimer No results for input(s): DDIMER in the last 72 hours. Hgb A1c No results for input(s): HGBA1C in the last 72 hours. Lipid Profile No results for input(s):  CHOL, HDL, LDLCALC, TRIG, CHOLHDL, LDLDIRECT in the last 72 hours. Thyroid function studies No results for input(s): TSH, T4TOTAL, T3FREE, THYROIDAB in the last 72 hours.  Invalid input(s): FREET3 Anemia work up No results for input(s): VITAMINB12, FOLATE, FERRITIN, TIBC, IRON, RETICCTPCT in the last 72 hours. Urinalysis    Component Value Date/Time   COLORURINE YELLOW 09/15/2024 1942   APPEARANCEUR CLEAR 09/15/2024 1942   LABSPEC 1.020 09/15/2024 1942   PHURINE 8.0 09/15/2024 1942   GLUCOSEU NEGATIVE 09/15/2024 1942   HGBUR NEGATIVE 09/15/2024 1942   BILIRUBINUR NEGATIVE 09/15/2024 1942   KETONESUR NEGATIVE 09/15/2024 1942   PROTEINUR 30 (A) 09/15/2024 1942   NITRITE NEGATIVE 09/15/2024 1942   LEUKOCYTESUR NEGATIVE 09/15/2024 1942   Sepsis Labs Recent Labs  Lab  09/18/24 0542 09/19/24 0514 09/20/24 0523 09/21/24 0552  WBC 4.8 4.7 4.1 3.2*   Microbiology Recent Results (from the past 240 hours)  Resp panel by RT-PCR (RSV, Flu A&B, Covid) Anterior Nasal Swab     Status: None   Collection Time: 09/15/24  2:26 PM   Specimen: Anterior Nasal Swab  Result Value Ref Range Status   SARS Coronavirus 2 by RT PCR NEGATIVE NEGATIVE Final    Comment: (NOTE) SARS-CoV-2 target nucleic acids are NOT DETECTED.  The SARS-CoV-2 RNA is generally detectable in upper respiratory specimens during the acute phase of infection. The lowest concentration of SARS-CoV-2 viral copies this assay can detect is 138 copies/mL. A negative result does not preclude SARS-Cov-2 infection and should not be used as the sole basis for treatment or other patient management decisions. A negative result may occur with  improper specimen collection/handling, submission of specimen other than nasopharyngeal swab, presence of viral mutation(s) within the areas targeted by this assay, and inadequate number of viral copies(<138 copies/mL). A negative result must be combined with clinical observations, patient history, and epidemiological information. The expected result is Negative.  Fact Sheet for Patients:  bloggercourse.com  Fact Sheet for Healthcare Providers:  seriousbroker.it  This test is no t yet approved or cleared by the [place]  FDA and  has been authorized for detection and/or diagnosis of SARS-CoV-2 by FDA under an Emergency Use Authorization (EUA). This EUA will remain  in effect (meaning this test can be used) for the duration of the COVID-19 declaration under Section 564(b)(1) of the Act, 21 U.S.C.section 360bbb-3(b)(1), unless the authorization is terminated  or revoked sooner.       Influenza A by PCR NEGATIVE NEGATIVE Final   Influenza B by PCR NEGATIVE NEGATIVE Final    Comment: (NOTE) The Xpert Xpress  SARS-CoV-2/FLU/RSV plus assay is intended as an aid in the diagnosis of influenza from Nasopharyngeal swab specimens and should not be used as a sole basis for treatment. Nasal washings and aspirates are unacceptable for Xpert Xpress SARS-CoV-2/FLU/RSV testing.  Fact Sheet for Patients: bloggercourse.com  Fact Sheet for Healthcare Providers: seriousbroker.it  This test is not yet approved or cleared by the [place]  FDA and has been authorized for detection and/or diagnosis of SARS-CoV-2 by FDA under an Emergency Use Authorization (EUA). This EUA will remain in effect (meaning this test can be used) for the duration of the COVID-19 declaration under Section 564(b)(1) of the Act, 21 U.S.C. section 360bbb-3(b)(1), unless the authorization is terminated or revoked.     Resp Syncytial Virus by PCR NEGATIVE NEGATIVE Final    Comment: (NOTE) Fact Sheet for Patients: bloggercourse.com  Fact Sheet for Healthcare Providers: seriousbroker.it  This test is not yet approved  or cleared by the [place]  FDA and has been authorized for detection and/or diagnosis of SARS-CoV-2 by FDA under an Emergency Use Authorization (EUA). This EUA will remain in effect (meaning this test can be used) for the duration of the COVID-19 declaration under Section 564(b)(1) of the Act, 21 U.S.C. section 360bbb-3(b)(1), unless the authorization is terminated or revoked.  Performed at [facility], [address], [place], [place] [address]   Blood culture (routine x 2)     Status: None   Collection Time: 09/15/24  4:50 PM   Specimen: BLOOD  Result Value Ref Range Status   Specimen Description   Final    BLOOD BLOOD LEFT FOREARM Performed at [facility], [address] [place], [place] [address]    Special Requests   Final    BOTTLES DRAWN AEROBIC AND  ANAEROBIC Blood Culture adequate volume Performed at [facility], [address] [place], [place] [address]    Culture   Final    NO GROWTH 5 DAYS Performed at [facility], [address] [place], [place] [address]    Report Status 09/20/2024 FINAL  Final  Blood culture (routine x 2)     Status: None   Collection Time: 09/16/24  6:00 AM   Specimen: BLOOD  Result Value Ref Range Status   Specimen Description   Final    BLOOD LEFT ANTECUBITAL Performed at [facility], [address], [place], [place] [address]    Special Requests   Final    BOTTLES DRAWN AEROBIC AND ANAEROBIC Blood Culture results may not be optimal due to an inadequate volume of blood received in culture bottles Performed at [facility], [address] [place], [place] [address]    Culture   Final    NO GROWTH 5 DAYS Performed at [facility], [address] [place], [place] [address]    Report Status 09/21/2024 FINAL  Final     Time coordinating discharge: 40 minutes  SIGNED:   Renato Applebaum, MD  [facility] 09/21/2024, 10:31 AM

## 2024-09-21 NOTE — Care Plan (Signed)
 Patient could not get satisfactory SNF offers and decided to go home.  Prepared discharge in anticipation of going home.  She will need to arrange family support system.  If able to arrange family support system, she can go home today otherwise she has to go home tomorrow.

## 2024-09-22 ENCOUNTER — Other Ambulatory Visit (HOSPITAL_COMMUNITY): Payer: Self-pay

## 2024-09-22 DIAGNOSIS — D649 Anemia, unspecified: Secondary | ICD-10-CM | POA: Diagnosis not present

## 2024-09-22 LAB — CBC WITH DIFFERENTIAL/PLATELET
Abs Immature Granulocytes: 0.05 K/uL (ref 0.00–0.07)
Basophils Absolute: 0 K/uL (ref 0.0–0.1)
Basophils Relative: 1 %
Eosinophils Absolute: 0 K/uL (ref 0.0–0.5)
Eosinophils Relative: 0 %
HCT: 27.4 % — ABNORMAL LOW (ref 36.0–46.0)
Hemoglobin: 9.3 g/dL — ABNORMAL LOW (ref 12.0–15.0)
Immature Granulocytes: 2 %
Lymphocytes Relative: 43 %
Lymphs Abs: 1.3 K/uL (ref 0.7–4.0)
MCH: 29.2 pg (ref 26.0–34.0)
MCHC: 33.9 g/dL (ref 30.0–36.0)
MCV: 86.2 fL (ref 80.0–100.0)
Monocytes Absolute: 0.8 K/uL (ref 0.1–1.0)
Monocytes Relative: 25 %
Neutro Abs: 0.9 K/uL — ABNORMAL LOW (ref 1.7–7.7)
Neutrophils Relative %: 29 %
Platelets: 137 K/uL — ABNORMAL LOW (ref 150–400)
RBC: 3.18 MIL/uL — ABNORMAL LOW (ref 3.87–5.11)
RDW: 18.5 % — ABNORMAL HIGH (ref 11.5–15.5)
Smear Review: NORMAL
WBC: 3.1 K/uL — ABNORMAL LOW (ref 4.0–10.5)
nRBC: 0 % (ref 0.0–0.2)

## 2024-09-22 NOTE — Progress Notes (Signed)
 Discharge instructions (including medications) discussed with and copy provided to patient and niece. Patient and niece given the opportunity to ask questions. Questions clarified.

## 2024-09-22 NOTE — Plan of Care (Signed)
  Problem: Education: Goal: Knowledge of General Education information will improve Description: Including pain rating scale, medication(s)/side effects and non-pharmacologic comfort measures Outcome: Adequate for Discharge   Problem: Health Behavior/Discharge Planning: Goal: Ability to manage health-related needs will improve Outcome: Adequate for Discharge   Problem: Clinical Measurements: Goal: Ability to maintain clinical measurements within normal limits will improve Outcome: Adequate for Discharge Goal: Will remain free from infection Outcome: Adequate for Discharge Goal: Diagnostic test results will improve Outcome: Adequate for Discharge Goal: Respiratory complications will improve Outcome: Adequate for Discharge Goal: Cardiovascular complication will be avoided Outcome: Adequate for Discharge   Problem: Activity: Goal: Risk for activity intolerance will decrease Outcome: Adequate for Discharge   Problem: Nutrition: Goal: Adequate nutrition will be maintained Outcome: Adequate for Discharge   Problem: Coping: Goal: Level of anxiety will decrease Outcome: Adequate for Discharge   Problem: Elimination: Goal: Will not experience complications related to bowel motility Outcome: Adequate for Discharge Goal: Will not experience complications related to urinary retention Outcome: Adequate for Discharge   Problem: Pain Managment: Goal: General experience of comfort will improve and/or be controlled Outcome: Adequate for Discharge   Problem: Safety: Goal: Ability to remain free from injury will improve Outcome: Adequate for Discharge   Problem: Skin Integrity: Goal: Risk for impaired skin integrity will decrease Outcome: Adequate for Discharge   Problem: Acute Rehab PT Goals(only PT should resolve) Goal: Pt Will Go Supine/Side To Sit Outcome: Adequate for Discharge Goal: Pt Will Go Sit To Supine/Side Outcome: Adequate for Discharge Goal: Patient Will Transfer  Sit To/From Stand Outcome: Adequate for Discharge Goal: Pt Will Ambulate Outcome: Adequate for Discharge Goal: Pt/caregiver will Perform Home Exercise Program Outcome: Adequate for Discharge   Problem: Acute Rehab OT Goals (only OT should resolve) Goal: Pt. Will Perform Lower Body Bathing Outcome: Adequate for Discharge Goal: Pt. Will Perform Lower Body Dressing Outcome: Adequate for Discharge Goal: Pt. Will Transfer To Toilet Outcome: Adequate for Discharge Goal: Pt. Will Perform Toileting-Clothing Manipulation Outcome: Adequate for Discharge Goal: OT Additional ADL Goal #1 Outcome: Adequate for Discharge

## 2024-09-22 NOTE — Progress Notes (Signed)
 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  Treated symptomatically with broad-spectrum antibiotics, 2 units of PRBC transfusion and adequate is stabilized to go home today.  Subjective: Patient seen and examined.  No overnight events.  She thinks she is getting much better and gaining strength.  Niece Rosaline at the bedside.  Comfortable with plan to go home with home health therapies. Assessment & Plan:   Discharge summary prepared.  Stable to discharge home.  Case discussed and updated her primary oncologist.    DVT prophylaxis: SCDs Start: 09/16/24 0503 Place TED hose Start: 09/16/24 0503   Code Status: Full code Family Communication: Niece at the bedside Disposition Plan: Status is: Inpatient Remains inpatient appropriate because: Discharging today     Consultants:  Oncology  Procedures:  None  Antimicrobials:  Levaquin     Objective: Vitals:   09/21/24 0401 09/21/24 1341 09/21/24 1950 09/22/24 0638  BP: (!) 140/70 123/67 134/69 136/69  Pulse: 89 93 93 73  Resp: 16 18 18 18   Temp: 98.4 F (36.9 C) 98.5 F (36.9 C) 98.4 F (36.9 C) 98.6 F (37 C)  TempSrc: Oral  Oral   SpO2: 98% 98% 97% 98%  Weight:      Height:        Intake/Output Summary (Last 24 hours) at 09/22/2024 0922 Last data filed at 09/21/2024 1245 Gross per 24 hour  Intake 240 ml  Output 700 ml  Net -460 ml   Filed Weights   09/15/24 1419  Weight: 72.8 kg    Examination:  General exam: Appears calm and comfortable  Respiratory system: Clear to auscultation. Respiratory effort normal. Cardiovascular system: S1 & S2 heard, RRR. No JVD, murmurs, rubs, gallops or clicks. No pedal edema. Gastrointestinal system: Abdomen is nondistended, soft and nontender. No organomegaly or masses  felt. Normal bowel sounds heard. Central nervous system: Alert and oriented. No focal neurological deficits. Extremities: Symmetric 5 x 5 power.    Data Reviewed: I have personally reviewed following labs and imaging studies  CBC: Recent Labs  Lab 09/18/24 0542 09/19/24 0514 09/20/24 0523 09/21/24 0552 09/22/24 0546  WBC 4.8 4.7 4.1 3.2* 3.1*  NEUTROABS 2.8 2.7 2.1 1.5* 0.9*  HGB 8.8* 8.5* 8.9* 8.7* 9.3*  HCT 25.8* 26.4* 26.7* 26.6* 27.4*  MCV 87.5 88.6 88.1 87.5 86.2  PLT 158 142* 136* 137* 137*   Basic Metabolic Panel: Recent Labs  Lab 09/15/24 1426 09/16/24 0600 09/17/24 0620 09/18/24 0542 09/20/24 0523  NA 132* 134* 135 136 133*  K 4.6 4.1 3.5 3.4* 4.0  CL 97* 99 104 106 101  CO2 25 21* 20* 20* 21*  GLUCOSE 157* 227* 111* 80 71  BUN 6* 8 8 9 9   CREATININE 0.67 0.71 0.62 0.62 0.57  CALCIUM 9.5 8.8* 7.9* 8.0* 8.3*  MG  --   --   --  1.8 2.0  PHOS  --   --   --  1.8* 1.9*   GFR: Estimated Creatinine Clearance: 51.1 mL/min (by C-G formula based on SCr of 0.57 mg/dL). Liver Function Tests: Recent Labs  Lab 09/15/24 1426 09/16/24 0600 09/17/24 0620 09/18/24 0542 09/20/24 0523  AST 30 38 33 30 30  ALT 5 7 5  <5 6  ALKPHOS 84 76 65 67 62  BILITOT 0.5 0.5 0.5 0.5 0.5  PROT 6.5 6.0* 5.4* 5.1* 5.3*  ALBUMIN 3.6 3.3* 2.9* 2.7* 2.6*   No results for input(s): LIPASE, AMYLASE in the last 168 hours. Recent Labs  Lab 09/16/24 0800  AMMONIA 25   Coagulation Profile: Recent Labs  Lab 09/15/24 1638  INR 1.1   Cardiac Enzymes: No results for input(s): CKTOTAL, CKMB, CKMBINDEX, TROPONINI in the last 168 hours. BNP (last 3 results) No results for input(s): PROBNP in the last 8760 hours. HbA1C: No results for input(s): HGBA1C in the last 72 hours. CBG: Recent Labs  Lab 09/15/24 1408  GLUCAP 133*   Lipid Profile: No results for input(s): CHOL, HDL, LDLCALC, TRIG, CHOLHDL, LDLDIRECT in the last 72 hours. Thyroid Function  Tests: No results for input(s): TSH, T4TOTAL, FREET4, T3FREE, THYROIDAB in the last 72 hours. Anemia Panel: No results for input(s): VITAMINB12, FOLATE, FERRITIN, TIBC, IRON, RETICCTPCT in the last 72 hours. Sepsis Labs: Recent Labs  Lab 09/15/24 1640 09/15/24 1848 09/16/24 0600 09/16/24 1056  LATICACIDVEN 2.2* 2.1* 4.1* 2.9*    Recent Results (from the past 240 hours)  Resp panel by RT-PCR (RSV, Flu A&B, Covid) Anterior Nasal Swab     Status: None   Collection Time: 09/15/24  2:26 PM   Specimen: Anterior Nasal Swab  Result Value Ref Range Status   SARS Coronavirus 2 by RT PCR NEGATIVE NEGATIVE Final    Comment: (NOTE) SARS-CoV-2 target nucleic acids are NOT DETECTED.  The SARS-CoV-2 RNA is generally detectable in upper respiratory specimens during the acute phase of infection. The lowest concentration of SARS-CoV-2 viral copies this assay can detect is 138 copies/mL. A negative result does not preclude SARS-Cov-2 infection and should not be used as the sole basis for treatment or other patient management decisions. A negative result may occur with  improper specimen collection/handling, submission of specimen other than nasopharyngeal swab, presence of viral mutation(s) within the areas targeted by this assay, and inadequate number of viral copies(<138 copies/mL). A negative result must be combined with clinical observations, patient history, and epidemiological information. The expected result is Negative.  Fact Sheet for Patients:  bloggercourse.com  Fact Sheet for Healthcare Providers:  seriousbroker.it  This test is no t yet approved or cleared by the [place]  FDA and  has been authorized for detection and/or diagnosis of SARS-CoV-2 by FDA under an Emergency Use Authorization (EUA). This EUA will remain  in effect (meaning this test can be used) for the duration of the COVID-19 declaration  under Section 564(b)(1) of the Act, 21 U.S.C.section 360bbb-3(b)(1), unless the authorization is terminated  or revoked sooner.       Influenza A by PCR NEGATIVE NEGATIVE Final   Influenza B by PCR NEGATIVE NEGATIVE Final    Comment: (NOTE) The Xpert Xpress SARS-CoV-2/FLU/RSV plus assay is intended as an aid in the diagnosis of influenza from Nasopharyngeal swab specimens and should not be used as a sole basis for treatment. Nasal washings and aspirates are unacceptable for Xpert Xpress SARS-CoV-2/FLU/RSV testing.  Fact Sheet for Patients: bloggercourse.com  Fact Sheet for Healthcare Providers: seriousbroker.it  This test is not yet approved or cleared by the [place]  FDA and has been authorized for detection and/or diagnosis of SARS-CoV-2 by FDA under an Emergency Use Authorization (EUA). This EUA will remain in effect (meaning this test can be used) for the duration of the COVID-19 declaration under Section 564(b)(1) of the Act, 21 U.S.C. section 360bbb-3(b)(1), unless the authorization is terminated or revoked.     Resp Syncytial Virus by  PCR NEGATIVE NEGATIVE Final    Comment: (NOTE) Fact Sheet for Patients: bloggercourse.com  Fact Sheet for Healthcare Providers: seriousbroker.it  This test is not yet approved or cleared by the [place]  FDA and has been authorized for detection and/or diagnosis of SARS-CoV-2 by FDA under an Emergency Use Authorization (EUA). This EUA will remain in effect (meaning this test can be used) for the duration of the COVID-19 declaration under Section 564(b)(1) of the Act, 21 U.S.C. section 360bbb-3(b)(1), unless the authorization is terminated or revoked.  Performed at [facility], [address] [place], [place] [address]   Blood culture (routine x 2)     Status: None   Collection Time: 09/15/24  4:50 PM    Specimen: BLOOD  Result Value Ref Range Status   Specimen Description   Final    BLOOD BLOOD LEFT FOREARM Performed at [facility], [address], [place], [place] [address]    Special Requests   Final    BOTTLES DRAWN AEROBIC AND ANAEROBIC Blood Culture adequate volume Performed at [facility], [address], [place], [place] [address]    Culture   Final    NO GROWTH 5 DAYS Performed at [facility], [address], [place], [place] [address]    Report Status 09/20/2024 FINAL  Final  Blood culture (routine x 2)     Status: None   Collection Time: 09/16/24  6:00 AM   Specimen: BLOOD  Result Value Ref Range Status   Specimen Description   Final    BLOOD LEFT ANTECUBITAL Performed at [facility], [address] [place], [place] [address]    Special Requests   Final    BOTTLES DRAWN AEROBIC AND ANAEROBIC Blood Culture results may not be optimal due to an inadequate volume of blood received in culture bottles Performed at [facility], [address], [place], [place] [address]    Culture   Final    NO GROWTH 5 DAYS Performed at [facility], [address] [place], [place] [address]    Report Status 09/21/2024 FINAL  Final         Radiology Studies: No results found.      Scheduled Meds:  sodium chloride    Intravenous Once   aspirin  EC  81 mg Oral Daily   feeding supplement  237 mL Oral BID BM   folic acid  2 mg Oral Daily   furosemide   20 mg Oral Daily   gabapentin   100 mg Oral TID   latanoprost   1 drop Both Eyes QHS   levofloxacin  500 mg Oral Daily   PONATinib  HCl  45 mg Oral Daily   potassium & sodium phosphates  1 packet Oral TID WC & HS   Continuous Infusions:   LOS: 6 days    Time spent: 25 minutes    Renato Applebaum, MD [facility]

## 2024-10-03 ENCOUNTER — Encounter (HOSPITAL_COMMUNITY): Payer: Self-pay

## 2024-10-03 ENCOUNTER — Emergency Department (HOSPITAL_COMMUNITY)

## 2024-10-03 ENCOUNTER — Other Ambulatory Visit: Payer: Self-pay

## 2024-10-03 ENCOUNTER — Inpatient Hospital Stay (HOSPITAL_COMMUNITY)
Admission: EM | Admit: 2024-10-03 | Discharge: 2024-10-24 | DRG: 871 | Disposition: E | Attending: Internal Medicine | Admitting: Internal Medicine

## 2024-10-03 DIAGNOSIS — Z882 Allergy status to sulfonamides status: Secondary | ICD-10-CM

## 2024-10-03 DIAGNOSIS — Z803 Family history of malignant neoplasm of breast: Secondary | ICD-10-CM

## 2024-10-03 DIAGNOSIS — E1142 Type 2 diabetes mellitus with diabetic polyneuropathy: Secondary | ICD-10-CM | POA: Diagnosis present

## 2024-10-03 DIAGNOSIS — D61818 Other pancytopenia: Secondary | ICD-10-CM | POA: Diagnosis not present

## 2024-10-03 DIAGNOSIS — R609 Edema, unspecified: Secondary | ICD-10-CM | POA: Diagnosis not present

## 2024-10-03 DIAGNOSIS — Z880 Allergy status to penicillin: Secondary | ICD-10-CM

## 2024-10-03 DIAGNOSIS — D649 Anemia, unspecified: Principal | ICD-10-CM | POA: Diagnosis present

## 2024-10-03 DIAGNOSIS — W19XXXA Unspecified fall, initial encounter: Secondary | ICD-10-CM | POA: Diagnosis present

## 2024-10-03 DIAGNOSIS — I341 Nonrheumatic mitral (valve) prolapse: Secondary | ICD-10-CM | POA: Diagnosis present

## 2024-10-03 DIAGNOSIS — E66811 Obesity, class 1: Secondary | ICD-10-CM | POA: Diagnosis present

## 2024-10-03 DIAGNOSIS — Z8249 Family history of ischemic heart disease and other diseases of the circulatory system: Secondary | ICD-10-CM

## 2024-10-03 DIAGNOSIS — Z79899 Other long term (current) drug therapy: Secondary | ICD-10-CM

## 2024-10-03 DIAGNOSIS — E119 Type 2 diabetes mellitus without complications: Secondary | ICD-10-CM | POA: Diagnosis not present

## 2024-10-03 DIAGNOSIS — R4189 Other symptoms and signs involving cognitive functions and awareness: Secondary | ICD-10-CM | POA: Diagnosis present

## 2024-10-03 DIAGNOSIS — R569 Unspecified convulsions: Secondary | ICD-10-CM | POA: Diagnosis not present

## 2024-10-03 DIAGNOSIS — N390 Urinary tract infection, site not specified: Secondary | ICD-10-CM | POA: Diagnosis present

## 2024-10-03 DIAGNOSIS — F419 Anxiety disorder, unspecified: Secondary | ICD-10-CM | POA: Diagnosis present

## 2024-10-03 DIAGNOSIS — J189 Pneumonia, unspecified organism: Secondary | ICD-10-CM | POA: Diagnosis present

## 2024-10-03 DIAGNOSIS — G629 Polyneuropathy, unspecified: Secondary | ICD-10-CM

## 2024-10-03 DIAGNOSIS — G9340 Encephalopathy, unspecified: Secondary | ICD-10-CM | POA: Diagnosis present

## 2024-10-03 DIAGNOSIS — R627 Adult failure to thrive: Secondary | ICD-10-CM | POA: Diagnosis present

## 2024-10-03 DIAGNOSIS — Z66 Do not resuscitate: Secondary | ICD-10-CM | POA: Diagnosis present

## 2024-10-03 DIAGNOSIS — I5032 Chronic diastolic (congestive) heart failure: Secondary | ICD-10-CM | POA: Diagnosis present

## 2024-10-03 DIAGNOSIS — I110 Hypertensive heart disease with heart failure: Secondary | ICD-10-CM | POA: Diagnosis present

## 2024-10-03 DIAGNOSIS — I639 Cerebral infarction, unspecified: Secondary | ICD-10-CM | POA: Diagnosis not present

## 2024-10-03 DIAGNOSIS — Z515 Encounter for palliative care: Secondary | ICD-10-CM | POA: Diagnosis not present

## 2024-10-03 DIAGNOSIS — J918 Pleural effusion in other conditions classified elsewhere: Secondary | ICD-10-CM | POA: Diagnosis present

## 2024-10-03 DIAGNOSIS — R10A2 Flank pain, left side: Secondary | ICD-10-CM | POA: Diagnosis not present

## 2024-10-03 DIAGNOSIS — Z1152 Encounter for screening for COVID-19: Secondary | ICD-10-CM | POA: Diagnosis not present

## 2024-10-03 DIAGNOSIS — M549 Dorsalgia, unspecified: Secondary | ICD-10-CM | POA: Diagnosis present

## 2024-10-03 DIAGNOSIS — R1310 Dysphagia, unspecified: Secondary | ICD-10-CM | POA: Diagnosis present

## 2024-10-03 DIAGNOSIS — D630 Anemia in neoplastic disease: Secondary | ICD-10-CM | POA: Diagnosis present

## 2024-10-03 DIAGNOSIS — I10 Essential (primary) hypertension: Secondary | ICD-10-CM | POA: Diagnosis present

## 2024-10-03 DIAGNOSIS — Z711 Person with feared health complaint in whom no diagnosis is made: Secondary | ICD-10-CM

## 2024-10-03 DIAGNOSIS — G9341 Metabolic encephalopathy: Secondary | ICD-10-CM | POA: Diagnosis present

## 2024-10-03 DIAGNOSIS — R1110 Vomiting, unspecified: Secondary | ICD-10-CM | POA: Diagnosis present

## 2024-10-03 DIAGNOSIS — Z7984 Long term (current) use of oral hypoglycemic drugs: Secondary | ICD-10-CM

## 2024-10-03 DIAGNOSIS — F05 Delirium due to known physiological condition: Secondary | ICD-10-CM | POA: Diagnosis present

## 2024-10-03 DIAGNOSIS — I959 Hypotension, unspecified: Secondary | ICD-10-CM | POA: Diagnosis not present

## 2024-10-03 DIAGNOSIS — N3000 Acute cystitis without hematuria: Secondary | ICD-10-CM | POA: Diagnosis not present

## 2024-10-03 DIAGNOSIS — R278 Other lack of coordination: Secondary | ICD-10-CM | POA: Diagnosis not present

## 2024-10-03 DIAGNOSIS — D696 Thrombocytopenia, unspecified: Secondary | ICD-10-CM | POA: Diagnosis present

## 2024-10-03 DIAGNOSIS — Y92009 Unspecified place in unspecified non-institutional (private) residence as the place of occurrence of the external cause: Secondary | ICD-10-CM

## 2024-10-03 DIAGNOSIS — L89312 Pressure ulcer of right buttock, stage 2: Secondary | ICD-10-CM | POA: Diagnosis present

## 2024-10-03 DIAGNOSIS — R509 Fever, unspecified: Secondary | ICD-10-CM | POA: Diagnosis not present

## 2024-10-03 DIAGNOSIS — E43 Unspecified severe protein-calorie malnutrition: Secondary | ICD-10-CM | POA: Diagnosis not present

## 2024-10-03 DIAGNOSIS — A419 Sepsis, unspecified organism: Principal | ICD-10-CM | POA: Diagnosis present

## 2024-10-03 DIAGNOSIS — E7800 Pure hypercholesterolemia, unspecified: Secondary | ICD-10-CM | POA: Diagnosis present

## 2024-10-03 DIAGNOSIS — Z7982 Long term (current) use of aspirin: Secondary | ICD-10-CM

## 2024-10-03 DIAGNOSIS — Z6832 Body mass index (BMI) 32.0-32.9, adult: Secondary | ICD-10-CM

## 2024-10-03 DIAGNOSIS — J90 Pleural effusion, not elsewhere classified: Secondary | ICD-10-CM | POA: Diagnosis present

## 2024-10-03 DIAGNOSIS — R54 Age-related physical debility: Secondary | ICD-10-CM | POA: Diagnosis present

## 2024-10-03 DIAGNOSIS — R0600 Dyspnea, unspecified: Secondary | ICD-10-CM

## 2024-10-03 DIAGNOSIS — R410 Disorientation, unspecified: Secondary | ICD-10-CM | POA: Diagnosis not present

## 2024-10-03 DIAGNOSIS — C9210 Chronic myeloid leukemia, BCR/ABL-positive, not having achieved remission: Secondary | ICD-10-CM | POA: Diagnosis present

## 2024-10-03 DIAGNOSIS — R0603 Acute respiratory distress: Secondary | ICD-10-CM | POA: Diagnosis not present

## 2024-10-03 DIAGNOSIS — R0609 Other forms of dyspnea: Secondary | ICD-10-CM | POA: Diagnosis not present

## 2024-10-03 DIAGNOSIS — K219 Gastro-esophageal reflux disease without esophagitis: Secondary | ICD-10-CM | POA: Diagnosis present

## 2024-10-03 DIAGNOSIS — Z7189 Other specified counseling: Secondary | ICD-10-CM

## 2024-10-03 DIAGNOSIS — L8990 Pressure ulcer of unspecified site, unspecified stage: Secondary | ICD-10-CM | POA: Insufficient documentation

## 2024-10-03 DIAGNOSIS — H409 Unspecified glaucoma: Secondary | ICD-10-CM | POA: Diagnosis present

## 2024-10-03 DIAGNOSIS — I5033 Acute on chronic diastolic (congestive) heart failure: Secondary | ICD-10-CM | POA: Insufficient documentation

## 2024-10-03 DIAGNOSIS — R4182 Altered mental status, unspecified: Secondary | ICD-10-CM | POA: Diagnosis not present

## 2024-10-03 LAB — COMPREHENSIVE METABOLIC PANEL WITH GFR
ALT: 9 U/L (ref 0–44)
AST: 37 U/L (ref 15–41)
Albumin: 3.3 g/dL — ABNORMAL LOW (ref 3.5–5.0)
Alkaline Phosphatase: 85 U/L (ref 38–126)
Anion gap: 12 (ref 5–15)
BUN: 13 mg/dL (ref 8–23)
CO2: 26 mmol/L (ref 22–32)
Calcium: 9 mg/dL (ref 8.9–10.3)
Chloride: 95 mmol/L — ABNORMAL LOW (ref 98–111)
Creatinine, Ser: 0.67 mg/dL (ref 0.44–1.00)
GFR, Estimated: >60 mL/min (ref >60)
Glucose, Bld: 153 mg/dL — ABNORMAL HIGH (ref 70–99)
Potassium: 4.3 mmol/L (ref 3.5–5.1)
Sodium: 133 mmol/L — ABNORMAL LOW (ref 135–145)
Total Bilirubin: 0.7 mg/dL (ref 0.0–1.2)
Total Protein: 6.4 g/dL — ABNORMAL LOW (ref 6.5–8.1)

## 2024-10-03 LAB — URINALYSIS, W/ REFLEX TO CULTURE (INFECTION SUSPECTED)
Glucose, UA: NEGATIVE mg/dL
Hgb urine dipstick: NEGATIVE
Ketones, ur: 20 mg/dL — AB
Nitrite: NEGATIVE
Protein, ur: 100 mg/dL — AB
Specific Gravity, Urine: 1.023 (ref 1.005–1.030)
pH: 5 (ref 5.0–8.0)

## 2024-10-03 LAB — CBC
HCT: 19.2 % — ABNORMAL LOW (ref 36.0–46.0)
Hemoglobin: 6.5 g/dL — CL (ref 12.0–15.0)
MCH: 29.1 pg (ref 26.0–34.0)
MCHC: 33.9 g/dL (ref 30.0–36.0)
MCV: 86.1 fL (ref 80.0–100.0)
Platelets: 149 K/uL — ABNORMAL LOW (ref 150–400)
RBC: 2.23 MIL/uL — ABNORMAL LOW (ref 3.87–5.11)
RDW: 16.5 % — ABNORMAL HIGH (ref 11.5–15.5)
WBC: 4.7 K/uL (ref 4.0–10.5)
nRBC: 0 % (ref 0.0–0.2)

## 2024-10-03 LAB — RESP PANEL BY RT-PCR (RSV, FLU A&B, COVID)  RVPGX2
Influenza A by PCR: NEGATIVE
Influenza B by PCR: NEGATIVE
Resp Syncytial Virus by PCR: NEGATIVE
SARS Coronavirus 2 by RT PCR: NEGATIVE

## 2024-10-03 LAB — BLOOD GAS, VENOUS
Acid-Base Excess: 1.3 mmol/L (ref 0.0–2.0)
Bicarbonate: 26.6 mmol/L (ref 20.0–28.0)
O2 Saturation: 36.4 %
Patient temperature: 37
pCO2, Ven: 45 mmHg (ref 44–60)
pH, Ven: 7.38 (ref 7.25–7.43)
pO2, Ven: <31 mmHg — CL (ref 32–45)

## 2024-10-03 LAB — AMMONIA: Ammonia: 14 umol/L (ref 9–35)

## 2024-10-03 LAB — I-STAT CG4 LACTIC ACID, ED: Lactic Acid, Venous: 1.9 mmol/L (ref 0.5–1.9)

## 2024-10-03 LAB — CBG MONITORING, ED: Glucose-Capillary: 149 mg/dL — ABNORMAL HIGH (ref 70–99)

## 2024-10-03 LAB — PREPARE RBC (CROSSMATCH)

## 2024-10-03 LAB — POC OCCULT BLOOD, ED: Fecal Occult Bld: NEGATIVE

## 2024-10-03 MED ORDER — SODIUM CHLORIDE 0.9% IV SOLUTION: Freq: Once | INTRAVENOUS | Status: AC

## 2024-10-03 MED ORDER — SODIUM CHLORIDE 0.9 % IV SOLN
1.0000 g | INTRAVENOUS | Status: DC
  Administered 2024-10-04 – 2024-10-07 (×4): 1 g via INTRAVENOUS
  Filled 2024-10-03 (×4): qty 10

## 2024-10-03 MED ORDER — LACTATED RINGERS IV BOLUS
1000.0000 mL | Freq: Once | INTRAVENOUS | Status: AC
  Administered 2024-10-03: 1000 mL via INTRAVENOUS

## 2024-10-03 MED ORDER — FUROSEMIDE 20 MG PO TABS: 20.0000 mg | ORAL_TABLET | Freq: Every day | ORAL | Status: DC

## 2024-10-03 MED ORDER — INSULIN ASPART 100 UNIT/ML IJ SOLN
0.0000 [IU] | Freq: Three times a day (TID) | INTRAMUSCULAR | Status: DC
  Administered 2024-10-11 – 2024-10-12 (×4): 1 [IU] via SUBCUTANEOUS
  Filled 2024-10-03 (×4): qty 1

## 2024-10-03 MED ORDER — SODIUM CHLORIDE 0.9 % IV SOLN
500.0000 mg | INTRAVENOUS | Status: DC
  Administered 2024-10-04 – 2024-10-07 (×4): 500 mg via INTRAVENOUS
  Filled 2024-10-03 (×4): qty 5

## 2024-10-03 MED ORDER — ACETAMINOPHEN 650 MG RE SUPP: 650.0000 mg | Freq: Four times a day (QID) | RECTAL | Status: DC | PRN

## 2024-10-03 MED ORDER — SODIUM CHLORIDE 0.9 % IV SOLN
500.0000 mg | Freq: Once | INTRAVENOUS | Status: AC
  Administered 2024-10-03: 500 mg via INTRAVENOUS
  Filled 2024-10-03: qty 5

## 2024-10-03 MED ORDER — SODIUM CHLORIDE 0.9 % IV SOLN
2.0000 g | Freq: Once | INTRAVENOUS | Status: AC
  Administered 2024-10-03: 2 g via INTRAVENOUS
  Filled 2024-10-03: qty 20

## 2024-10-03 MED ORDER — ACETAMINOPHEN 500 MG PO TABS
1000.0000 mg | ORAL_TABLET | Freq: Once | ORAL | Status: AC
  Administered 2024-10-03: 1000 mg via ORAL
  Filled 2024-10-03: qty 2

## 2024-10-03 MED ORDER — FOLIC ACID 1 MG PO TABS
1.0000 mg | ORAL_TABLET | Freq: Every day | ORAL | Status: DC
  Administered 2024-10-05 – 2024-10-10 (×6): 1 mg via ORAL
  Filled 2024-10-03 (×8): qty 1

## 2024-10-03 MED ORDER — ACETAMINOPHEN 325 MG PO TABS
650.0000 mg | ORAL_TABLET | Freq: Four times a day (QID) | ORAL | Status: DC | PRN
  Administered 2024-10-05 – 2024-10-08 (×5): 650 mg via ORAL
  Filled 2024-10-03 (×6): qty 2

## 2024-10-03 MED ORDER — ENOXAPARIN SODIUM 40 MG/0.4ML IJ SOSY
40.0000 mg | PREFILLED_SYRINGE | INTRAMUSCULAR | Status: DC
  Administered 2024-10-04 – 2024-10-11 (×9): 40 mg via SUBCUTANEOUS
  Filled 2024-10-03 (×9): qty 0.4

## 2024-10-03 MED ORDER — LATANOPROST 0.005 % OP SOLN
1.0000 [drp] | Freq: Every day | OPHTHALMIC | Status: DC
  Administered 2024-10-04 – 2024-10-11 (×9): 1 [drp] via OPHTHALMIC
  Filled 2024-10-03 (×2): qty 2.5

## 2024-10-03 NOTE — ED Triage Notes (Addendum)
 Pt reports with weakness, not eating and drinking as normal and seeming more confused per son x 1 week. Pt denies pain or feeling unwell.

## 2024-10-03 NOTE — H&P (Signed)
 History and Physical    Erica [name] FMW:[id number] DOB: 1946/01/06 DOA: 10/03/2024  Patient coming from: Home.  Chief Complaint: Weakness confusion.  HPI: Erica [name] is a 78 y.o. female with history of CML was recently admitted on September 13, 2024 for symptomatic anemia and also sepsis had received 2 units of PRBC and empiric antibiotics was brought to the ER today as patient's son found that patient was getting profoundly weak over the last one week and also getting more confused with poor appetite.  Patient's son also noted that patient has been increasing peripheral edema.  Did not complain of any nausea vomiting diarrhea chest pain or shortness of breath.  ED Course: In the ER patient had a temperature of 101.4 F with chest x-ray showing features concerning for pneumonia and UA was showing leukocyte esterase positive with many bacteria and WBCs.  Patient's labs also showed hemoglobin of 6.5 which dropped from 9.3 on September 22, 2024.  Patient had a similar picture of presentation last admission three weeks ago at that time stool for occult blood was negative and folic acid was found to be low.  CT of the head is unremarkable.  Patient is started on empiric antibiotics for pneumonia/UTI and a unit of PRBC was ordered.  Admitted for further management.  On exam patient is alert awake but slow to respond to questions.  Moving all extremities.  Review of Systems: As per HPI, rest all negative.   Past Medical History:  Diagnosis Date   Bilateral swelling of feet    Cancer (HCC)    Cataract    Diabetes mellitus without complication (HCC)    diet controlled   Eczema    Elevated cholesterol    GERD (gastroesophageal reflux disease)    Glaucoma    Hypertension    Mitral valve prolapse    Simple endometrial hyperplasia without atypia 07/2016   In endometrial polyp with surrounding endometrium inactive recommend follow up ultrasound for endometrial echo 1 year   Spondylisthesis      Past Surgical History:  Procedure Laterality Date   CESAREAN SECTION     DILATATION & CURETTAGE/HYSTEROSCOPY WITH MYOSURE N/A 08/05/2016   Procedure: DILATATION & CURETTAGE/HYSTEROSCOPY WITH MYOSURE;  Surgeon: Evalene SHAUNNA Organ, MD;  Location: [facility];  Service: Gynecology;  Laterality: N/A;   EXCISION OF SKIN TAG Left 08/05/2016   Procedure: EXCISION OF SKIN TAG;  Surgeon: Evalene SHAUNNA Organ, MD;  Location: [facility];  Service: Gynecology;  Laterality: Left;   MOUTH SURGERY     spinal tap     THORACENTESIS N/A 11/02/2023   Procedure: THORACENTESIS;  Surgeon: Annella Donnice SAUNDERS, MD;  Location: [facility] ENDOSCOPY;  Service: Pulmonary;  Laterality: N/A;     reports that she has never smoked. She has never used smokeless tobacco. She reports that she does not drink alcohol and does not use drugs.  Allergies  Allergen Reactions   Penicillins Hives   Sulfa Antibiotics Hives    Family History  Problem Relation Age of Onset   Heart disease Mother    Heart disease Father    Breast cancer Sister 24    Prior to Admission medications   Medication Sig Start Date End Date Taking? Authorizing Provider  allopurinol  (ZYLOPRIM ) 300 MG tablet Take 1 tablet by mouth daily. 12/29/23   [provider]  aspirin  EC 81 MG tablet Take 81 mg by mouth daily. Swallow whole.    [provider]  folic acid (FOLVITE) 1 MG tablet Take  1 tablet (1 mg total) by mouth daily. 09/22/24   Ghimire, Kuber, MD  furosemide  (LASIX ) 20 MG tablet Take 20 mg by mouth daily. 09/06/20   [provider]  gabapentin  (NEURONTIN ) 100 MG capsule Take 100 mg by mouth 3 (three) times daily. 10/23/20   [provider]  latanoprost  (XALATAN ) 0.005 % ophthalmic solution Place 1 drop into both eyes at bedtime. BRAND NAME    [provider]  loratadine (CLARITIN) 10 MG tablet Take 10 mg by mouth daily as needed for allergies.    [provider]  PONATinib  HCl (ICLUSIG ) 45 MG tablet Take 45  mg by mouth daily. 12/29/23 12/28/24  [provider]  propranolol  (INDERAL ) 10 MG tablet Take 10 mg by mouth 2 (two) times daily as needed (palpitations).    [provider]  traMADol  (ULTRAM ) 50 MG tablet Take 50 mg by mouth every 6 (six) hours as needed for moderate pain.     [provider]  triamcinolone cream (KENALOG) 0.1 % Apply 1 application  topically 2 (two) times daily as needed (skin irritation).    [provider]    Physical Exam: Constitutional: Moderately built and nourished. Vitals:   10/03/24 2117 10/03/24 2130 10/03/24 2151 10/03/24 2206  BP: (!) 109/55 (!) 103/59 (!) 110/50 (!) 99/45  Pulse: (!) 106 (!) 103 (!) 107 (!) 101  Resp: 19 16 16 16   Temp: 98.7 F (37.1 C) 100 F (37.8 C) 99.2 F (37.3 C) 98.8 F (37.1 C)  TempSrc: Oral Oral Oral   SpO2: 90% 92%  99%   Eyes: Anicteric no pallor. ENMT: No discharge from the ears eyes nose or mouth. Neck: No mass felt.  No neck rigidity. Respiratory: No rhonchi or crepitations. Cardiovascular: S1 S2 heard. Abdomen: Nontender bowel sound present. Musculoskeletal: Mild edema of the both lower extremities. Skin: No rash. Neurologic: Alert awake oriented to person and place.  Very slow to respond moving all extremities. Psychiatric: Alert awake oriented to place and person.   Labs on Admission: I have personally reviewed following labs and imaging studies  CBC: Recent Labs  Lab 10/03/24 1502  WBC 4.7  HGB 6.5*  HCT 19.2*  MCV 86.1  PLT 149*   Basic Metabolic Panel: Recent Labs  Lab 10/03/24 1502  NA 133*  K 4.3  CL 95*  CO2 26  GLUCOSE 153*  BUN 13  CREATININE 0.67  CALCIUM 9.0   GFR: CrCl cannot be calculated (Unknown ideal weight.). Liver Function Tests: Recent Labs  Lab 10/03/24 1502  AST 37  ALT 9  ALKPHOS 85  BILITOT 0.7  PROT 6.4*  ALBUMIN 3.3*   No results for input(s): LIPASE, AMYLASE in the last 168 hours. Recent Labs  Lab 10/03/24 1850   AMMONIA 14   Coagulation Profile: No results for input(s): INR, PROTIME in the last 168 hours. Cardiac Enzymes: No results for input(s): CKTOTAL, CKMB, CKMBINDEX, TROPONINI in the last 168 hours. BNP (last 3 results) No results for input(s): PROBNP in the last 8760 hours. HbA1C: No results for input(s): HGBA1C in the last 72 hours. CBG: Recent Labs  Lab 10/03/24 1454  GLUCAP 149*   Lipid Profile: No results for input(s): CHOL, HDL, LDLCALC, TRIG, CHOLHDL, LDLDIRECT in the last 72 hours. Thyroid Function Tests: No results for input(s): TSH, T4TOTAL, FREET4, T3FREE, THYROIDAB in the last 72 hours. Anemia Panel: No results for input(s): VITAMINB12, FOLATE, FERRITIN, TIBC, IRON, RETICCTPCT in the last 72 hours. Urine analysis:    Component  Value Date/Time   COLORURINE AMBER (A) 10/03/2024 2055   APPEARANCEUR TURBID (A) 10/03/2024 2055   LABSPEC 1.023 10/03/2024 2055   PHURINE 5.0 10/03/2024 2055   GLUCOSEU NEGATIVE 10/03/2024 2055   HGBUR NEGATIVE 10/03/2024 2055   BILIRUBINUR SMALL (A) 10/03/2024 2055   KETONESUR 20 (A) 10/03/2024 2055   PROTEINUR 100 (A) 10/03/2024 2055   NITRITE NEGATIVE 10/03/2024 2055   LEUKOCYTESUR TRACE (A) 10/03/2024 2055   Sepsis Labs: @LABRCNTIP (procalcitonin:4,lacticidven:4) ) Recent Results (from the past 240 hours)  Resp panel by RT-PCR (RSV, Flu A&B, Covid) Anterior Nasal Swab     Status: None   Collection Time: 10/03/24  6:50 PM   Specimen: Anterior Nasal Swab  Result Value Ref Range Status   SARS Coronavirus 2 by RT PCR NEGATIVE NEGATIVE Final    Comment: (NOTE) SARS-CoV-2 target nucleic acids are NOT DETECTED.  The SARS-CoV-2 RNA is generally detectable in upper respiratory specimens during the acute phase of infection. The lowest concentration of SARS-CoV-2 viral copies this assay can detect is 138 copies/mL. A negative result does not preclude SARS-Cov-2 infection and should not be  used as the sole basis for treatment or other patient management decisions. A negative result may occur with  improper specimen collection/handling, submission of specimen other than nasopharyngeal swab, presence of viral mutation(s) within the areas targeted by this assay, and inadequate number of viral copies(<138 copies/mL). A negative result must be combined with clinical observations, patient history, and epidemiological information. The expected result is Negative.  Fact Sheet for Patients:  bloggercourse.com  Fact Sheet for Healthcare Providers:  seriousbroker.it  This test is no t yet approved or cleared by the [place]  FDA and  has been authorized for detection and/or diagnosis of SARS-CoV-2 by FDA under an Emergency Use Authorization (EUA). This EUA will remain  in effect (meaning this test can be used) for the duration of the COVID-19 declaration under Section 564(b)(1) of the Act, 21 U.S.C.section 360bbb-3(b)(1), unless the authorization is terminated  or revoked sooner.       Influenza A by PCR NEGATIVE NEGATIVE Final   Influenza B by PCR NEGATIVE NEGATIVE Final    Comment: (NOTE) The Xpert Xpress SARS-CoV-2/FLU/RSV plus assay is intended as an aid in the diagnosis of influenza from Nasopharyngeal swab specimens and should not be used as a sole basis for treatment. Nasal washings and aspirates are unacceptable for Xpert Xpress SARS-CoV-2/FLU/RSV testing.  Fact Sheet for Patients: bloggercourse.com  Fact Sheet for Healthcare Providers: seriousbroker.it  This test is not yet approved or cleared by the [place]  FDA and has been authorized for detection and/or diagnosis of SARS-CoV-2 by FDA under an Emergency Use Authorization (EUA). This EUA will remain in effect (meaning this test can be used) for the duration of the COVID-19 declaration under Section  564(b)(1) of the Act, 21 U.S.C. section 360bbb-3(b)(1), unless the authorization is terminated or revoked.     Resp Syncytial Virus by PCR NEGATIVE NEGATIVE Final    Comment: (NOTE) Fact Sheet for Patients: bloggercourse.com  Fact Sheet for Healthcare Providers: seriousbroker.it  This test is not yet approved or cleared by the [place]  FDA and has been authorized for detection and/or diagnosis of SARS-CoV-2 by FDA under an Emergency Use Authorization (EUA). This EUA will remain in effect (meaning this test can be used) for the duration of the COVID-19 declaration under Section 564(b)(1) of the Act, 21 U.S.C. section 360bbb-3(b)(1), unless the authorization is terminated or revoked.  Performed at [facility],  [address], [place], [place] [address]      Radiological Exams on Admission: CT Head Wo Contrast Result Date: 10/03/2024 EXAM: CT HEAD WITHOUT CONTRAST 10/03/2024 06:39:09 PM TECHNIQUE: CT of the head was performed without the administration of intravenous contrast. Automated exposure control, iterative reconstruction, and/or weight based adjustment of the mA/kV was utilized to reduce the radiation dose to as low as reasonably achievable. COMPARISON: 09/15/2024 CLINICAL HISTORY: Delirium FINDINGS: BRAIN AND VENTRICLES: No acute hemorrhage. No evidence of acute infarct. No hydrocephalus. No extra-axial collection. No mass effect or midline shift. Generalized volume loss. Vascular calcifications. ORBITS: Bilateral lens replacement. SINUSES: No acute abnormality. SOFT TISSUES AND SKULL: No acute soft tissue abnormality. No skull fracture. IMPRESSION: 1. No acute intracranial abnormality. Electronically signed by: Norman Gatlin MD 10/03/2024 07:14 PM EST RP Workstation: [id number]   DG Chest Port 1 View Result Date: 10/03/2024 CLINICAL DATA:  Questionable sepsis EXAM: PORTABLE CHEST 1 VIEW COMPARISON:   Chest x-ray 09/15/2024 FINDINGS: There is some hazy and patchy opacities in the right lung base which are new from prior. There is likely a small right pleural effusion. Cardiomediastinal silhouette is within normal limits. There is no pneumothorax or acute fracture. IMPRESSION: New hazy and patchy opacities in the right lung base with likely small right pleural effusion. Findings are concerning for pneumonia. Electronically Signed   By: Greig Pique M.D.   On: 10/03/2024 18:41    EKG: Independently reviewed.  Sinus tachycardia.  Assessment/Plan Principal Problem:   Acute encephalopathy Active Problems:   Symptomatic anemia   Chronic myeloid leukemia (HCC)   Diabetes mellitus type 2 in nonobese [facility])   Essential hypertension   Peripheral neuropathy   Acute on chronic heart failure with preserved ejection fraction (HFpEF) (HCC)   UTI (urinary tract infection)   Pneumonia    Symptomatic anemia with history of CML -    had a similar picture of presentation 3 weeks ago.  PRBC transfusion has been ordered.  Follow CBC after transfusion.  During last admission there was some concern for esophageal mass and [facility] had recommended follow-up as outpatient. Fever likely from pneumonia/UTI.  On empiric antibiotics.  Follow cultures. Acute encephalopathy likely multifactorial.  Per patient's son patient has been getting progressively confused over the last few months.  CT head is unremarkable.  Ammonia was normal.  Could be related to infection.  Will check MRI brain. CML on Ponatinib .  Holding it now in the setting of fever. Chronic diastolic CHF last EF measured was more than 75% with grade 1 diastolic dysfunction on December 2024.  Takes Lasix .  Will hold it for now but due to blood pressure was in the low normal.  Does have edema of the lower extremities will check Dopplers. Diabetes mellitus type 2 treated with diet.  On sliding scale coverage.  Check hemoglobin A1c. Thrombocytopenia appears to  be chronic.  Follow CBC.  Since patient has symptomatic anemia with fever and encephalopathy will need further management and more than 2 midnight stay.   DVT prophylaxis: Lovenox . Code Status: Full code as confirmed with patient's son. Family Communication: Patient's son. Disposition Plan: Monitor bed. Consults called: None. Admission status: Inpatient.

## 2024-10-03 NOTE — ED Provider Notes (Signed)
 [place] EMERGENCY DEPARTMENT AT [facility] Provider Note   CSN: [id number] Arrival date & time: 10/03/24  1440     Patient presents with: Weakness   Erica [name] is a 78 y.o. female.   78 yo F with a chief complaints of fatigue.  Going on for about a week.  Tired all the time not wanting to eat or drink.  Increasingly confused.  No fevers that they know of at home.  No cough or congestion.  No obvious urinary symptoms.  Denies abdominal pain.   Weakness      Prior to Admission medications   Medication Sig Start Date End Date Taking? Authorizing Provider  allopurinol  (ZYLOPRIM ) 300 MG tablet Take 1 tablet by mouth daily. 12/29/23   [provider]  aspirin  EC 81 MG tablet Take 81 mg by mouth daily. Swallow whole.    [provider]  folic acid (FOLVITE) 1 MG tablet Take 1 tablet (1 mg total) by mouth daily. 09/22/24   Ghimire, Kuber, MD  furosemide  (LASIX ) 20 MG tablet Take 20 mg by mouth daily. 09/06/20   [provider]  gabapentin  (NEURONTIN ) 100 MG capsule Take 100 mg by mouth 3 (three) times daily. 10/23/20   [provider]  latanoprost  (XALATAN ) 0.005 % ophthalmic solution Place 1 drop into both eyes at bedtime. BRAND NAME    [provider]  loratadine (CLARITIN) 10 MG tablet Take 10 mg by mouth daily as needed for allergies.    [provider]  PONATinib  HCl (ICLUSIG ) 45 MG tablet Take 45 mg by mouth daily. 12/29/23 12/28/24  [provider]  propranolol  (INDERAL ) 10 MG tablet Take 10 mg by mouth 2 (two) times daily as needed (palpitations).    [provider]  traMADol  (ULTRAM ) 50 MG tablet Take 50 mg by mouth every 6 (six) hours as needed for moderate pain.     [provider]  triamcinolone cream (KENALOG) 0.1 % Apply 1 application  topically 2 (two) times daily as needed (skin irritation).    [provider]    Allergies: Penicillins and Sulfa antibiotics     Review of Systems  Neurological:  Positive for weakness.    Updated Vital Signs BP 131/60 (BP Location: Left Arm)   Pulse 96   Temp 98.6 F (37 C) (Oral)   Resp 16   SpO2 94%   Physical Exam Vitals and nursing note reviewed.  Constitutional:      General: She is not in acute distress.    Appearance: She is well-developed. She is not diaphoretic.     Comments: Patient's skin is quite warm to the touch.  HENT:     Head: Normocephalic and atraumatic.  Eyes:     Pupils: Pupils are equal, round, and reactive to light.  Cardiovascular:     Rate and Rhythm: Regular rhythm. Tachycardia present.     Heart sounds: No murmur heard.    No friction rub. No gallop.  Pulmonary:     Effort: Pulmonary effort is normal.     Breath sounds: No wheezing or rales.  Abdominal:     General: There is no distension.     Palpations: Abdomen is soft.     Tenderness: There is no abdominal tenderness.  Musculoskeletal:        General: No tenderness.     Cervical back: Normal range of motion and neck supple.  Skin:    General: Skin is warm and dry.  Neurological:  Mental Status: She is alert and oriented to person, place, and time.  Psychiatric:        Behavior: Behavior normal.     (all labs ordered are listed, but only abnormal results are displayed) Labs Reviewed  COMPREHENSIVE METABOLIC PANEL WITH GFR - Abnormal; Notable for the following components:      Result Value   Sodium 133 (*)    Chloride 95 (*)    Glucose, Bld 153 (*)    Total Protein 6.4 (*)    Albumin 3.3 (*)    All other components within normal limits  CBC - Abnormal; Notable for the following components:   RBC 2.23 (*)    Hemoglobin 6.5 (*)    HCT 19.2 (*)    RDW 16.5 (*)    Platelets 149 (*)    All other components within normal limits  BLOOD GAS, VENOUS - Abnormal; Notable for the following components:   pO2, Ven <31 (*)    All other components within normal limits  CBG MONITORING, ED - Abnormal; Notable  for the following components:   Glucose-Capillary 149 (*)    All other components within normal limits  RESP PANEL BY RT-PCR (RSV, FLU A&B, COVID)  [id number]  CULTURE, BLOOD (ROUTINE X 2)  CULTURE, BLOOD (ROUTINE X 2)  AMMONIA  URINALYSIS, ROUTINE W REFLEX MICROSCOPIC  URINALYSIS, W/ REFLEX TO CULTURE (INFECTION SUSPECTED)  OCCULT BLOOD X 1 CARD TO LAB, STOOL  I-STAT CG4 LACTIC ACID, ED  I-STAT CG4 LACTIC ACID, ED  PREPARE RBC (CROSSMATCH)  TYPE AND SCREEN    EKG: EKG Interpretation Date/Time:  Monday October 03 2024 14:54:59 EST Ventricular Rate:  108 PR Interval:  127 QRS Duration:  76 QT Interval:  315 QTC Calculation: 423 R Axis:   38  Text Interpretation: Sinus tachycardia Since last tracing rate faster Otherwise no significant change Confirmed by Emil Share [phone]) on 10/03/2024 4:17:35 PM  Radiology: CT Head Wo Contrast Result Date: 10/03/2024 EXAM: CT HEAD WITHOUT CONTRAST 10/03/2024 06:39:09 PM TECHNIQUE: CT of the head was performed without the administration of intravenous contrast. Automated exposure control, iterative reconstruction, and/or weight based adjustment of the mA/kV was utilized to reduce the radiation dose to as low as reasonably achievable. COMPARISON: 09/15/2024 CLINICAL HISTORY: Delirium FINDINGS: BRAIN AND VENTRICLES: No acute hemorrhage. No evidence of acute infarct. No hydrocephalus. No extra-axial collection. No mass effect or midline shift. Generalized volume loss. Vascular calcifications. ORBITS: Bilateral lens replacement. SINUSES: No acute abnormality. SOFT TISSUES AND SKULL: No acute soft tissue abnormality. No skull fracture. IMPRESSION: 1. No acute intracranial abnormality. Electronically signed by: Norman Gatlin MD 10/03/2024 07:14 PM EST RP Workstation: [id number]   DG Chest Port 1 View Result Date: 10/03/2024 CLINICAL DATA:  Questionable sepsis EXAM: PORTABLE CHEST 1 VIEW COMPARISON:  Chest x-ray 09/15/2024 FINDINGS: There is some hazy and  patchy opacities in the right lung base which are new from prior. There is likely a small right pleural effusion. Cardiomediastinal silhouette is within normal limits. There is no pneumothorax or acute fracture. IMPRESSION: New hazy and patchy opacities in the right lung base with likely small right pleural effusion. Findings are concerning for pneumonia. Electronically Signed   By: Greig Pique M.D.   On: 10/03/2024 18:41     .Critical Care  Performed by: Emil Share, DO Authorized by: Emil Share, DO   Critical care provider statement:    Critical care time (minutes):  35   Critical care time was exclusive of:  Separately billable  procedures and treating other patients   Critical care was time spent personally by me on the following activities:  Development of treatment plan with patient or surrogate, discussions with consultants, evaluation of patient's response to treatment, examination of patient, ordering and review of laboratory studies, ordering and review of radiographic studies, ordering and performing treatments and interventions, pulse oximetry, re-evaluation of patient's condition and review of old charts   Care discussed with: admitting provider      Medications Ordered in the ED  acetaminophen  (TYLENOL ) tablet 1,000 mg (1,000 mg Oral Patient Refused/Not Given 10/03/24 1912)  0.9 %  sodium chloride  infusion (Manually program via Guardrails IV Fluids) (has no administration in time range)  cefTRIAXone (ROCEPHIN) 2 g in sodium chloride  0.9 % 100 mL IVPB (has no administration in time range)  azithromycin (ZITHROMAX) 500 mg in sodium chloride  0.9 % 250 mL IVPB (has no administration in time range)  lactated ringers  bolus 1,000 mL (1,000 mLs Intravenous New Bag/Given 10/03/24 1916)                                    Medical Decision Making Amount and/or Complexity of Data Reviewed Labs: ordered. Radiology: ordered.  Risk OTC drugs. Prescription drug management.   78 yo F  with a chief complaints of altered mental status.  Sounds like progressive fatigue and failure to thrive over the past week.  On my record review the patient actually had a similar event a few weeks ago and required admission to the hospital.  At that time she was found to be anemic requiring blood transfusion.  Febrile.  No obvious source of infection was found.  Hemoglobin is low again here today.  History of CML on oral immunomodulatory therapy.  Patient's hemoglobin 6.3.  Denies any bleeding denies dark stool or blood in her stool.  No hypercarbia.  Lactate normal.  Ammonia level normal.  COVID flu and RSV are negative.  Chest x-ray independently interpreted by me concerning for right lower lobe pneumonia.  Started on antibiotics.  I discussed case with the hospitalist for admission.  The patients results and plan were reviewed and discussed.   Any x-rays performed were independently reviewed by myself.   Differential diagnosis were considered with the presenting HPI.  Medications  acetaminophen  (TYLENOL ) tablet 1,000 mg (1,000 mg Oral Patient Refused/Not Given 10/03/24 1912)  0.9 %  sodium chloride  infusion (Manually program via Guardrails IV Fluids) (has no administration in time range)  cefTRIAXone (ROCEPHIN) 2 g in sodium chloride  0.9 % 100 mL IVPB (has no administration in time range)  azithromycin (ZITHROMAX) 500 mg in sodium chloride  0.9 % 250 mL IVPB (has no administration in time range)  lactated ringers  bolus 1,000 mL (1,000 mLs Intravenous New Bag/Given 10/03/24 1916)    Vitals:   10/03/24 1448 10/03/24 1802  BP: (!) 115/55 131/60  Pulse: (!) 105 96  Resp: 16 16  Temp: 99.6 F (37.6 C) 98.6 F (37 C)  TempSrc: Oral Oral  SpO2: 99% 94%    Final diagnoses:  Symptomatic anemia  Pneumonia of right lower lobe due to infectious organism    Admission/ observation were discussed with the admitting physician, patient and/or family and they are comfortable with the plan.         Final diagnoses:  Symptomatic anemia  Pneumonia of right lower lobe due to infectious organism    ED Discharge Orders  None          Emil Share, DO 10/03/24 2024

## 2024-10-04 ENCOUNTER — Inpatient Hospital Stay (HOSPITAL_COMMUNITY)

## 2024-10-04 DIAGNOSIS — R410 Disorientation, unspecified: Secondary | ICD-10-CM | POA: Diagnosis not present

## 2024-10-04 DIAGNOSIS — R278 Other lack of coordination: Secondary | ICD-10-CM

## 2024-10-04 DIAGNOSIS — C9210 Chronic myeloid leukemia, BCR/ABL-positive, not having achieved remission: Secondary | ICD-10-CM | POA: Diagnosis not present

## 2024-10-04 DIAGNOSIS — N390 Urinary tract infection, site not specified: Secondary | ICD-10-CM

## 2024-10-04 DIAGNOSIS — I639 Cerebral infarction, unspecified: Secondary | ICD-10-CM

## 2024-10-04 DIAGNOSIS — G9340 Encephalopathy, unspecified: Secondary | ICD-10-CM | POA: Diagnosis not present

## 2024-10-04 DIAGNOSIS — I10 Essential (primary) hypertension: Secondary | ICD-10-CM

## 2024-10-04 DIAGNOSIS — E119 Type 2 diabetes mellitus without complications: Secondary | ICD-10-CM

## 2024-10-04 DIAGNOSIS — D649 Anemia, unspecified: Secondary | ICD-10-CM | POA: Diagnosis not present

## 2024-10-04 DIAGNOSIS — I5032 Chronic diastolic (congestive) heart failure: Secondary | ICD-10-CM

## 2024-10-04 DIAGNOSIS — R609 Edema, unspecified: Secondary | ICD-10-CM | POA: Diagnosis not present

## 2024-10-04 DIAGNOSIS — N3000 Acute cystitis without hematuria: Secondary | ICD-10-CM

## 2024-10-04 DIAGNOSIS — J189 Pneumonia, unspecified organism: Secondary | ICD-10-CM | POA: Diagnosis not present

## 2024-10-04 LAB — COMPREHENSIVE METABOLIC PANEL WITH GFR
ALT: 7 U/L (ref 0–44)
AST: 35 U/L (ref 15–41)
Albumin: 3 g/dL — ABNORMAL LOW (ref 3.5–5.0)
Alkaline Phosphatase: 80 U/L (ref 38–126)
Anion gap: 14 (ref 5–15)
BUN: 11 mg/dL (ref 8–23)
CO2: 24 mmol/L (ref 22–32)
Calcium: 8.6 mg/dL — ABNORMAL LOW (ref 8.9–10.3)
Chloride: 97 mmol/L — ABNORMAL LOW (ref 98–111)
Creatinine, Ser: 0.62 mg/dL (ref 0.44–1.00)
GFR, Estimated: >60 mL/min (ref >60)
Glucose, Bld: 104 mg/dL — ABNORMAL HIGH (ref 70–99)
Potassium: 3.9 mmol/L (ref 3.5–5.1)
Sodium: 135 mmol/L (ref 135–145)
Total Bilirubin: 0.6 mg/dL (ref 0.0–1.2)
Total Protein: 5.9 g/dL — ABNORMAL LOW (ref 6.5–8.1)

## 2024-10-04 LAB — CBC
HCT: 23.7 % — ABNORMAL LOW (ref 36.0–46.0)
Hemoglobin: 7.8 g/dL — ABNORMAL LOW (ref 12.0–15.0)
MCH: 28.5 pg (ref 26.0–34.0)
MCHC: 32.9 g/dL (ref 30.0–36.0)
MCV: 86.5 fL (ref 80.0–100.0)
Platelets: 126 K/uL — ABNORMAL LOW (ref 150–400)
RBC: 2.74 MIL/uL — ABNORMAL LOW (ref 3.87–5.11)
RDW: 16.2 % — ABNORMAL HIGH (ref 11.5–15.5)
WBC: 4.2 K/uL (ref 4.0–10.5)
nRBC: 0 % (ref 0.0–0.2)

## 2024-10-04 LAB — GLUCOSE, CAPILLARY
Glucose-Capillary: 106 mg/dL — ABNORMAL HIGH (ref 70–99)
Glucose-Capillary: 124 mg/dL — ABNORMAL HIGH (ref 70–99)
Glucose-Capillary: 179 mg/dL — ABNORMAL HIGH (ref 70–99)
Glucose-Capillary: 95 mg/dL (ref 70–99)
Glucose-Capillary: 99 mg/dL (ref 70–99)

## 2024-10-04 LAB — IRON AND TIBC
Iron: 180 ug/dL — ABNORMAL HIGH (ref 28–170)
Saturation Ratios: 83 % — ABNORMAL HIGH (ref 10.4–31.8)
TIBC: 217 ug/dL — ABNORMAL LOW (ref 250–450)
UIBC: 37 ug/dL

## 2024-10-04 LAB — TSH: TSH: 1.84 u[IU]/mL (ref 0.350–4.500)

## 2024-10-04 LAB — VITAMIN B12: Vitamin B-12: 1322 pg/mL — ABNORMAL HIGH (ref 180–914)

## 2024-10-04 LAB — HEMOGLOBIN A1C
Hgb A1c MFr Bld: 6.9 % — ABNORMAL HIGH (ref 4.8–5.6)
Mean Plasma Glucose: 151.33 mg/dL

## 2024-10-04 LAB — FERRITIN: Ferritin: 2409 ng/mL — ABNORMAL HIGH (ref 11–307)

## 2024-10-04 LAB — FOLATE: Folate: 10.3 ng/mL (ref >5.9)

## 2024-10-04 LAB — RETICULOCYTES
Immature Retic Fract: 1.8 % — ABNORMAL LOW (ref 2.3–15.9)
RBC.: 2.75 MIL/uL — ABNORMAL LOW (ref 3.87–5.11)
Retic Count, Absolute: 12.1 K/uL — ABNORMAL LOW (ref 19.0–186.0)
Retic Ct Pct: 0.4 % (ref 0.4–3.1)

## 2024-10-04 MED ORDER — ONDANSETRON HCL 4 MG/2ML IJ SOLN
4.0000 mg | Freq: Four times a day (QID) | INTRAMUSCULAR | Status: DC | PRN
  Administered 2024-10-04 – 2024-10-09 (×2): 4 mg via INTRAVENOUS
  Filled 2024-10-04 (×2): qty 2

## 2024-10-04 MED ORDER — ASPIRIN 81 MG PO TBEC
81.0000 mg | DELAYED_RELEASE_TABLET | Freq: Every day | ORAL | Status: DC
  Administered 2024-10-04 – 2024-10-10 (×7): 81 mg via ORAL
  Filled 2024-10-04 (×8): qty 1

## 2024-10-04 MED ORDER — PROCHLORPERAZINE EDISYLATE 10 MG/2ML IJ SOLN
5.0000 mg | Freq: Once | INTRAMUSCULAR | Status: AC
  Administered 2024-10-04: 5 mg via INTRAVENOUS
  Filled 2024-10-04: qty 2

## 2024-10-04 MED ORDER — SODIUM CHLORIDE 0.9 % IV SOLN: INTRAVENOUS | Status: DC

## 2024-10-04 NOTE — Consult Note (Signed)
 NEUROLOGY CONSULT NOTE   Date of service: October 04, 2024 Patient Name: Erica Erica [name] MRN:  [id number] DOB:  21-Mar-1946 Chief Complaint: Altered mental status Requesting Provider: Kathrin Mignon DASEN, MD  History of Present Illness  Erica Erica [name] is a 78 y.o. female with history of Erica [name], Erica Erica [name], Erica Erica [name].  She was encephalopathic throughout the hospital stay.  Her son states that he arrived from [place] where he lives on 11/3 and since that time, she has had a waxing/waning status.  Over the past few days she was worsening and he brought her back where she was found to be febrile with a consolidation as well as with possible evidence of UTI.  Her son does not live with her, but has no concerns typically of short-term memory or other issues.  She continues to work.  He has noticed no difficulties with bill payment.  Past History   Past Medical History:  Diagnosis Date   Bilateral swelling of feet    Cancer (HCC)    Cataract    Diabetes mellitus without complication (HCC)    diet controlled   Eczema    Elevated cholesterol    GERD (gastroesophageal reflux disease)    Glaucoma    Hypertension    Mitral valve prolapse    Simple endometrial hyperplasia without atypia 07/2016   In endometrial polyp with surrounding endometrium inactive recommend follow up ultrasound for endometrial echo 1 year   Spondylisthesis     Past Surgical History:  Procedure Laterality Date   CESAREAN SECTION     DILATATION & CURETTAGE/HYSTEROSCOPY WITH MYOSURE N/A 08/05/2016   Procedure: DILATATION & CURETTAGE/HYSTEROSCOPY WITH MYOSURE;  Surgeon: Erica SHAUNNA Organ, MD;  Location: [facility];  Service: Gynecology;  Laterality: N/A;   EXCISION OF SKIN TAG Left 08/05/2016   Procedure: EXCISION OF SKIN TAG;  Surgeon: Erica SHAUNNA Organ, MD;  Location: [facility];  Service: Gynecology;  Laterality: Left;    MOUTH SURGERY     spinal tap     THORACENTESIS N/A 11/02/2023   Procedure: THORACENTESIS;  Surgeon: Erica Donnice SAUNDERS, MD;  Location: [facility]COPY;  Service: Pulmonary;  Laterality: N/A;    Family History: Family History  Problem Relation Age of Onset   Heart disease Mother    Heart disease Father    Breast cancer Sister 39    Social History  reports that she has never smoked. She has never used smokeless tobacco. She reports that she does not drink alcohol and does not use drugs.  Allergies  Allergen Reactions   Penicillins Hives   Sulfa Antibiotics Hives    Medications   Current Facility-Administered Medications:    0.9 %  sodium chloride  infusion, , Intravenous, Continuous, Gonfa, Taye T, MD, Last Rate: 75 mL/hr at 10/04/24 1054, New Bag at 10/04/24 1054   acetaminophen  (TYLENOL ) tablet 650 mg, 650 mg, Oral, Q6H PRN **OR** acetaminophen  (TYLENOL ) suppository 650 mg, 650 mg, Rectal, Q6H PRN, Erica Redia SAILOR, MD   azithromycin (ZITHROMAX) 500 mg in sodium chloride  0.9 % 250 mL IVPB, 500 mg, Intravenous, Q24H, Erica Redia SAILOR, MD, Last Rate: 250 mL/hr at 10/04/24 1022, 500 mg at 10/04/24 1022   cefTRIAXone (ROCEPHIN) 1 g in sodium chloride  0.9 % 100 mL IVPB, 1 g, Intravenous, Q24H, Erica Redia SAILOR, MD, Last Rate: 200 mL/hr at 10/04/24 1022, 1 g at 10/04/24 1022   enoxaparin  (LOVENOX ) injection 40  mg, 40 mg, Subcutaneous, Q24H, Erica Redia SAILOR, MD, 40 mg at 10/04/24 0110   folic acid (FOLVITE) tablet 1 mg, 1 mg, Oral, Daily, Erica Redia SAILOR, MD   insulin  aspart (novoLOG ) injection 0-6 Units, 0-6 Units, Subcutaneous, TID WC, Erica Redia SAILOR, MD   latanoprost  (XALATAN ) 0.005 % ophthalmic solution 1 drop, 1 drop, Both Eyes, QHS, Erica Redia SAILOR, MD, 1 drop at 10/04/24 0028   ondansetron  (ZOFRAN ) injection 4 mg, 4 mg, Intravenous, Q6H PRN, Gonfa, Taye T, MD, 4 mg at 10/04/24 1054  Vitals   Vitals:   10/04/24 0125 10/04/24 0414 10/04/24 1338  10/04/24 1434  BP: (!) 111/55 (!) 118/55 (!) 104/47   Pulse: 93 88 (!) 102   Resp: 18 17 16    Temp: 97.9 F (36.6 C) 98.2 F (36.8 C) 100.1 F (37.8 C)   TempSrc: Oral Oral Oral   SpO2: 100% 100% 95%   Weight:    72.8 kg  Height:    4' 11 (1.499 m)    Body mass index is 32.42 kg/m.   Physical Exam   Constitutional: Appears well-developed and well-nourished.   Neurologic Examination    Neuro: Mental Status: Patient is awake, alert, oriented to person, place, month, year.  She does have some mild perseveration, but no definite aphasia.  She has difficulty spelling world backwards, but can spell it forwards. Cranial Nerves: II: Visual Fields are full. Pupils are equal, round, and reactive to light.   III,IV, VI: EOMI without ptosis or diploplia.  V: Facial sensation is symmetric to temperature VII: Facial movement is symmetric.  VIII: hearing is intact to voice X: Uvula elevates symmetrically XII: tongue is midline without atrophy or fasciculations.  Motor: She has good strength to confrontation throughout, but does have asterixis bilaterally.   Sensory: Sensation is symmetric to light touch and temperature in the arms and legs. Cerebellar: FNF and HKS are intact bilaterally     Labs/Imaging/Neurodiagnostic studies   CBC:  Recent Labs  Lab 10/10/2024 1502 10/04/24 0431  WBC 4.7 4.2  HGB 6.5* 7.8*  HCT 19.2* 23.7*  MCV 86.1 86.5  PLT 149* 126*   Basic Metabolic Panel:  Lab Results  Component Value Date   NA 135 10/04/2024   K 3.9 10/04/2024   CO2 24 10/04/2024   GLUCOSE 104 (H) 10/04/2024   BUN 11 10/04/2024   CREATININE 0.62 10/04/2024   CALCIUM 8.6 (L) 10/04/2024   GFRNONAA >60 10/04/2024   GFRAA >60 07/31/2016   HgbA1c:  Lab Results  Component Value Date   HGBA1C 6.9 (H) 10/04/2024   Alcohol Level  INR  Lab Results  Component Value Date   INR 1.1 09/15/2024   B12 1322 Tsh 1.840 Ammonia 14  MRI Brain(Personally reviewed): Possible  punctate diffusion abnormality in the corpus callosum, suspect artifact versus cytotoxic lesion of the corpus callosum  ASSESSMENT   Erica Erica [name] is a 78 y.o. female who presents with waxing and waning altered mental status, most consistent with delirium in the setting of pneumonia and possible UTI.  My suspicion is that this represents an infectious delirium, and the MRI finding is likely incidental if not artifactual.  There is a small possibility given that it is the body of the corpus callosum that it could be a punctate small vessel stroke, and I think would be relatively low risk to check lipids and start a baby aspirin .  I do not think that this would be an embolic ischemic event.  She does  have mild asterixis, which be consistent with metabolic etiology.  Hypercarbia can contribute to this, and so I will check a VBG.  Given there is some waxing and waning I do think an EEG would be reasonable to rule out seizures, but my suspicion for seizure is relatively low.  RECOMMENDATIONS  Aspirin  81 mg daily, lipid panel Treatment of underlying infection per CCM EEG VBG ______________________________________________________________________    Signed, Aisha Seals, MD [facility]

## 2024-10-04 NOTE — Progress Notes (Signed)
 Mobility Specialist - Progress Note   10/04/24 0840  Mobility  Activity Ambulated with assistance;Pivoted/transferred to/from [facility]  Level of Assistance Minimal assist, patient does 75% or more  Assistive Device Front wheel walker  Distance Ambulated (ft) 3 ft  Range of Motion/Exercises Active  Activity Response Tolerated fair  Mobility visit 1 Mobility  Mobility Specialist Start Time (ACUTE ONLY) 0815  Mobility Specialist Stop Time (ACUTE ONLY) 0840  Mobility Specialist Time Calculation (min) (ACUTE ONLY) 25 min   Pt requested assistance to bathroom. Upon sitting EOB stated feeling dizzy and unresolved with transfer to [facility]. Stated dizziness getting worse with standing. After transfer took lateral step along EOB. Was left in bed with all needs met. Call bell in reach and RN notified.   Erminio Leos,  Mobility Specialist Can be reached via Secure Chat

## 2024-10-04 NOTE — Plan of Care (Signed)

## 2024-10-04 NOTE — Progress Notes (Signed)
 Venous duplex lower ext  has been completed. Refer to [facility] under chart review to view preliminary results.   10/04/2024  2:31 PM Andersyn Fragoso, Ricka BIRCH

## 2024-10-04 NOTE — Progress Notes (Signed)
 PROGRESS NOTE  Erica [name] FMW:[id number] DOB: [birth year]   PCP: Arloa Elsie SAUNDERS, MD  Patient is from: Home.  Lives alone but recently his son moved in with her.  Has rolling walker.  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   In ED, febrile to 101.4.  CXR concerning for pneumonia.  UA concerning for UTI.  Hgb 6.5 (from 9.3 on 10/30).  Hemoccult negative.  TSH, B12, and ammonia unrevealing CT head without significant finding.  Cultures obtained.  Started on ceftriaxone and Zithromax.  MRI brain and lower extremity venous Doppler ordered.  The next day, MRI brain with punctate left body corpus callosum diffusion abnormality, nonspecific.  Seems to have left pronator drift/left arm weakness.  Neurology consulted.  Subjective: Seen and examined earlier this morning.  No major events overnight or this morning.  No complaints other than a little back pain.  She is awake and oriented to self, place and person but not time.  She says she had an episode of emesis yesterday but not anymore.  Emesis was nonbloody.  Patient's aunt at bedside.  He says she had a fall on her side yesterday.  Not sure if she hit her head.  No LOC.   Assessment and plan: Symptomatic anemia with history of CML: Admitted about 3 weeks ago and transfused 2 units with appropriate response.  Occult negative last admission and this admission.  No report of overt bleeding.  Low bilirubin argues against hemolysis.  Transfused 1 unit with appropriate response Recent Labs     09/16/24 1609 09/17/24 0116 09/17/24 0814 09/18/24 0542 09/19/24 0514 09/20/24 0523 09/21/24 0552 09/22/24 0546 10/03/24 1502 10/04/24 0431  HGB 7.9* 9.0* 9.5* 8.8* 8.5* 8.9* 8.7* 9.3* 6.5* 7.8*  -Check anemia panel -Monitor H&H.  Sepsis due to pneumonia and UTI: Present on admission.  Tachycardic and febrile.  No report of respiratory or UTI symptoms but CXR with new hazy patchy opacities in right lung base with small right pleural effusion concerning for pneumonia.  UA with bacteria and trace LE.  COVID-19, influenza and RSV PCR nonreactive. -Continue ceftriaxone and Zithromax. -Incentive spirometry. -Follow cultures  Acute encephalopathy: Multifactorial clearing infection, underlying cognitive impairment, adverse effect of medication and possible CVA.  She seems to have left pronator drift and left arm weakness.  Not sure if this is acute or chronic.  MRI brain as above.  B12 elevated to 1300.  TSH and ammonia within normal range. - Treat infection and anemia as above - Continue holding Ponatinib . - Neurology consult   Generalized weakness/physical deconditioning Fall at home-no apparent injury. 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  Follows with oncology at [facility].  Last visit on 08/26/2024.  Question if his symptoms are from Ponatinib  or CNS involvement of CML -Continue holding Ponatinib .  Chronic HFpEF: TTE in 10/2023 with LVEF of 75% and G1 DD.  Appears euvolemic on exam.  No cardiopulmonary symptoms.  Trace BLE edema. - Continue holding diuretics - Monitor fluid and respiratory status.  NIDDM-2: A1c 6.9%.  Not on meds at home. -Continue  CBG monitoring and SSI  Thrombocytopenia: Stable -Continue monitoring  There is no height or weight on file to calculate BMI.          DVT prophylaxis:  enoxaparin  (LOVENOX ) injection 40 mg Start: 10/03/24 2215  Code Status: Full code Family Communication: Updated patient's son at bedside Level of care:  Telemetry Status is: Inpatient Remains inpatient appropriate because: Acute encephalopathy, weakness, pneumonia, UTI   Final disposition: To be determined   55 minutes with more than 50% spent in reviewing records, counseling patient/family and coordinating care.  Consultants:  Neurology  Procedures: None  Microbiology summarized: Blood cultures NGTD COVID 19, influenza and RSV PCR nonreactive Urine culture pending  Objective: Vitals:   10/03/24 2230 10/04/24 0015 10/04/24 0125 10/04/24 0414  BP: (!) 103/56 (!) 93/52 (!) 111/55 (!) 118/55  Pulse: 98 99 93 88  Resp:  16 18 17   Temp:  98.5 F (36.9 C) 97.9 F (36.6 C) 98.2 F (36.8 C)  TempSrc:   Oral Oral  SpO2: 91% 97% 100% 100%    Examination:  GENERAL: No apparent distress.  Nontoxic. HEENT: MMM.  Vision and hearing grossly intact.  NECK: Supple.  No apparent JVD.  RESP:  No IWOB.  Fair aeration bilaterally. CVS:  RRR. Heart sounds normal.  ABD/GI/GU: BS+. Abd soft, NTND.  MSK/EXT:  Moves extremities. No apparent deformity. No edema.  SKIN: no apparent skin lesion or wound NEURO: Awake and alert.  Oriented to self, place and person. Cranial nerves II-XII intact.  Weakness in left arm (3+/5).  Light sensation intact in all dermatomes. Patellar reflex symmetric.  Left pronator drift.  Not able to perform finger-to-nose task with left arm.  No nystagmus. PSYCH: Calm. Normal affect.   Sch Meds:  Scheduled Meds:  enoxaparin  (LOVENOX ) injection  40 mg Subcutaneous Q24H   folic acid  1 mg Oral Daily   insulin  aspart  0-6 Units Subcutaneous TID WC   latanoprost   1 drop Both Eyes QHS   Continuous Infusions:  sodium chloride      azithromycin 500 mg (10/04/24 1022)   cefTRIAXone (ROCEPHIN)  IV 1 g (10/04/24 1022)   PRN Meds:.acetaminophen  **OR** acetaminophen , ondansetron  (ZOFRAN ) IV  Antimicrobials: Anti-infectives (From admission, onward)    Start     Dose/Rate Route Frequency Ordered Stop   10/04/24 1000   cefTRIAXone (ROCEPHIN) 1 g in sodium chloride  0.9 % 100 mL IVPB        1 g 200 mL/hr over 30 Minutes Intravenous Every 24 hours 10/03/24 2208     10/04/24 1000  azithromycin (ZITHROMAX) 500 mg in sodium chloride  0.9 % 250 mL IVPB        500 mg 250 mL/hr over 60 Minutes Intravenous Every 24 hours 10/03/24 2209     10/03/24 1930  cefTRIAXone (ROCEPHIN) 2 g in sodium chloride  0.9 % 100 mL IVPB        2 g 200 mL/hr over 30 Minutes Intravenous  Once 10/03/24 1916 10/03/24 2055   10/03/24 1930  azithromycin (ZITHROMAX) 500 mg in sodium chloride  0.9 % 250 mL IVPB        500 mg 250 mL/hr over 60 Minutes Intravenous  Once 10/03/24 1916 10/04/24 0819        I have personally reviewed the following labs and images: CBC: Recent Labs  Lab 10/03/24 1502 10/04/24 0431  WBC 4.7 4.2  HGB 6.5* 7.8*  HCT 19.2* 23.7*  MCV 86.1 86.5  PLT 149* 126*   BMP &GFR Recent Labs  Lab 10/03/24 1502 10/04/24 0431  NA 133* 135  K 4.3 3.9  CL 95* 97*  CO2 26 24  GLUCOSE 153* 104*  BUN 13 11  CREATININE 0.67 0.62  CALCIUM 9.0 8.6*   CrCl cannot be calculated (Unknown ideal weight.). Liver & Pancreas: Recent Labs  Lab 10/03/24 1502 10/04/24 0431  AST 37 35  ALT 9 7  ALKPHOS 85 80  BILITOT 0.7 0.6  PROT 6.4* 5.9*  ALBUMIN 3.3* 3.0*   No results for input(s): LIPASE, AMYLASE in the last 168 hours. Recent Labs  Lab 10/03/24 1850  AMMONIA 14   Diabetic: Recent Labs    10/04/24 0434  HGBA1C 6.9*   Recent Labs  Lab 10/03/24 1454 10/04/24 0014 10/04/24 0738  GLUCAP 149* 124* 95   Cardiac Enzymes: No results for input(s): CKTOTAL, CKMB, CKMBINDEX, TROPONINI in the last 168 hours. No results for input(s): PROBNP in the last 8760 hours. Coagulation Profile: No results for input(s): INR, PROTIME in the last 168 hours. Thyroid Function Tests: Recent Labs    10/04/24 0431  TSH 1.840   Lipid Profile: No results for input(s): CHOL, HDL, LDLCALC, TRIG,  CHOLHDL, LDLDIRECT in the last 72 hours. Anemia Panel: Recent Labs    10/04/24 0431  VITAMINB12 1,322*   Urine analysis:    Component Value Date/Time   COLORURINE AMBER (A) 10/03/2024 2055   APPEARANCEUR TURBID (A) 10/03/2024 2055   LABSPEC 1.023 10/03/2024 2055   PHURINE 5.0 10/03/2024 2055   GLUCOSEU NEGATIVE 10/03/2024 2055   HGBUR NEGATIVE 10/03/2024 2055   BILIRUBINUR SMALL (A) 10/03/2024 2055   KETONESUR 20 (A) 10/03/2024 2055   PROTEINUR 100 (A) 10/03/2024 2055   NITRITE NEGATIVE 10/03/2024 2055   LEUKOCYTESUR TRACE (A) 10/03/2024 2055   Sepsis Labs: Invalid input(s): PROCALCITONIN, LACTICIDVEN  Microbiology: Recent Results (from the past 240 hours)  Blood Culture (routine x 2)     Status: None (Preliminary result)   Collection Time: 10/03/24  5:51 PM   Specimen: BLOOD  Result Value Ref Range Status   Specimen Description   Final    BLOOD LEFT ANTECUBITAL Performed at [facility], [address], [place], [place] [address]    Special Requests   Final    BOTTLES DRAWN AEROBIC AND ANAEROBIC Blood Culture adequate volume Performed at [facility], [address], [place], [place] [address]    Culture   Final    NO GROWTH < 12 HOURS Performed at [facility], [address] [place], [place] [address]    Report Status PENDING  Incomplete  Resp panel by RT-PCR (RSV, Flu A&B, Covid) Anterior Nasal Swab     Status: None   Collection Time: 10/03/24  6:50 PM   Specimen: Anterior Nasal Swab  Result Value Ref Range Status   SARS Coronavirus 2 by RT PCR NEGATIVE NEGATIVE Final    Comment: (NOTE) SARS-CoV-2 target nucleic acids are NOT DETECTED.  The SARS-CoV-2 RNA is generally detectable in upper respiratory specimens during the acute phase of infection. The lowest concentration of SARS-CoV-2 viral copies this assay can detect is 138 copies/mL. A negative result does not preclude SARS-Cov-2 infection and should  not be used as the sole basis for treatment or other patient management decisions. A negative result may occur with  improper specimen collection/handling, submission of specimen other than nasopharyngeal swab, presence of viral mutation(s) within the areas targeted by this assay, and inadequate number of viral copies(<138 copies/mL). A negative result must  be combined with clinical observations, patient history, and epidemiological information. The expected result is Negative.  Fact Sheet for Patients:  bloggercourse.com  Fact Sheet for Healthcare Providers:  seriousbroker.it  This test is no t yet approved or cleared by the [place]  FDA and  has been authorized for detection and/or diagnosis of SARS-CoV-2 by FDA under an Emergency Use Authorization (EUA). This EUA will remain  in effect (meaning this test can be used) for the duration of the COVID-19 declaration under Section 564(b)(1) of the Act, 21 U.S.C.section 360bbb-3(b)(1), unless the authorization is terminated  or revoked sooner.       Influenza A by PCR NEGATIVE NEGATIVE Final   Influenza B by PCR NEGATIVE NEGATIVE Final    Comment: (NOTE) The Xpert Xpress SARS-CoV-2/FLU/RSV plus assay is intended as an aid in the diagnosis of influenza from Nasopharyngeal swab specimens and should not be used as a sole basis for treatment. Nasal washings and aspirates are unacceptable for Xpert Xpress SARS-CoV-2/FLU/RSV testing.  Fact Sheet for Patients: bloggercourse.com  Fact Sheet for Healthcare Providers: seriousbroker.it  This test is not yet approved or cleared by the [place]  FDA and has been authorized for detection and/or diagnosis of SARS-CoV-2 by FDA under an Emergency Use Authorization (EUA). This EUA will remain in effect (meaning this test can be used) for the duration of the COVID-19 declaration under  Section 564(b)(1) of the Act, 21 U.S.C. section 360bbb-3(b)(1), unless the authorization is terminated or revoked.     Resp Syncytial Virus by PCR NEGATIVE NEGATIVE Final    Comment: (NOTE) Fact Sheet for Patients: bloggercourse.com  Fact Sheet for Healthcare Providers: seriousbroker.it  This test is not yet approved or cleared by the [place]  FDA and has been authorized for detection and/or diagnosis of SARS-CoV-2 by FDA under an Emergency Use Authorization (EUA). This EUA will remain in effect (meaning this test can be used) for the duration of the COVID-19 declaration under Section 564(b)(1) of the Act, 21 U.S.C. section 360bbb-3(b)(1), unless the authorization is terminated or revoked.  Performed at [facility], [address], [place], [place] [address]   Blood Culture (routine x 2)     Status: None (Preliminary result)   Collection Time: 10/04/24  4:31 AM   Specimen: BLOOD LEFT HAND  Result Value Ref Range Status   Specimen Description   Final    BLOOD LEFT HAND Performed at [facility], [address] [place], [place] [address]    Special Requests   Final    Blood Culture adequate volume BOTTLES DRAWN AEROBIC AND ANAEROBIC Performed at [facility], [address], [place], [place] [address]    Culture PENDING  Incomplete   Report Status PENDING  Incomplete    Radiology Studies: VAS US  LOWER EXTREMITY VENOUS (DVT) Result Date: 10/04/2024  Lower Venous DVT Study Patient Name:  LUKE RIGSBEE  Date of Exam:   10/04/2024 Medical Rec #: [id number]         Accession #:    [id number] Date of Birth: [birth year]        Patient Gender: F Patient Age:   26 years Exam Location:  [facility] Procedure:      VAS US  LOWER EXTREMITY VENOUS (DVT) Referring Phys: REDIA CLEAVER --------------------------------------------------------------------------------  Indications:  Edema.  Risk Factors: Diabetes. Comparison Study: 11/20/23 - Negative Performing Technologist: Ricka Sturdivant-Jones RDMS, RVT  Examination Guidelines: A complete evaluation includes B-mode imaging, spectral Doppler, color Doppler, and power Doppler  as needed of all accessible portions of each vessel. Bilateral testing is considered an integral part of a complete examination. Limited examinations for reoccurring indications may be performed as noted. The reflux portion of the exam is performed with the patient in reverse Trendelenburg.  +---------+---------------+---------+-----------+----------+--------------+ RIGHT    CompressibilityPhasicitySpontaneityPropertiesThrombus Aging +---------+---------------+---------+-----------+----------+--------------+ CFV      Full           Yes      Yes                                 +---------+---------------+---------+-----------+----------+--------------+ SFJ      Full                                                        +---------+---------------+---------+-----------+----------+--------------+ FV Prox  Full                                                        +---------+---------------+---------+-----------+----------+--------------+ FV Mid   Full           Yes      Yes                                 +---------+---------------+---------+-----------+----------+--------------+ FV DistalFull                                                        +---------+---------------+---------+-----------+----------+--------------+ PFV      Full                                                        +---------+---------------+---------+-----------+----------+--------------+ POP      Full           Yes      Yes                                 +---------+---------------+---------+-----------+----------+--------------+ PTV      Full                                                         +---------+---------------+---------+-----------+----------+--------------+ PERO     Full                                                        +---------+---------------+---------+-----------+----------+--------------+   +---------+---------------+---------+-----------+----------+--------------+ LEFT  CompressibilityPhasicitySpontaneityPropertiesThrombus Aging +---------+---------------+---------+-----------+----------+--------------+ CFV      Full           Yes      Yes                                 +---------+---------------+---------+-----------+----------+--------------+ SFJ      Full                                                        +---------+---------------+---------+-----------+----------+--------------+ FV Prox  Full                                                        +---------+---------------+---------+-----------+----------+--------------+ FV Mid   Full           Yes      Yes                                 +---------+---------------+---------+-----------+----------+--------------+ FV DistalFull                                                        +---------+---------------+---------+-----------+----------+--------------+ PFV      Full                                                        +---------+---------------+---------+-----------+----------+--------------+ POP      Full           Yes      Yes                                 +---------+---------------+---------+-----------+----------+--------------+ PTV      Full                                                        +---------+---------------+---------+-----------+----------+--------------+ PERO     Full                                                        +---------+---------------+---------+-----------+----------+--------------+ Small popliteal fossa cyst measuring 2.3 x 0.80 cm.    Summary: BILATERAL: - No evidence of deep vein thrombosis seen in  the lower extremities, bilaterally. - LEFT: - A cystic structure is found in the popliteal fossa.  *See table(s) above for measurements and observations.    Preliminary    MR BRAIN WO CONTRAST  Result Date: 10/04/2024 EXAM: MRI BRAIN WITHOUT CONTRAST 10/04/2024 08:08:43 AM TECHNIQUE: Multiplanar multisequence MRI of the head/brain was performed without the administration of intravenous contrast. COMPARISON: None available. CLINICAL HISTORY: 78 year old female with confusion and altered mental status. FINDINGS: BRAIN AND VENTRICLES: No acute infarct. No intracranial hemorrhage. No mass. No midline shift. No hydrocephalus. The sella is unremarkable. Normal flow voids. Evidence of a punctate focus within the body of the corpus callosum slightly to the left of midline on series 13 image 23. There is perhaps subtle associated T2 hyperintensity there on coronal images; however, axial FLAIR and T2 do not corroborate the lesion. No other convincing diffusion restriction is identified. This finding is nonspecific and can be seen in the setting of renal failure, hypertension, metabolic disturbances (including hypoglycemia, hypo/hypernatremia), ischemia, seizure, infection, demyelination, radiation, and chemotherapy. Background gray and white matter signal is normal for age. No cortical encephalomalacia or chronic cerebral blood products. ORBITS: No acute abnormality. SINUSES AND MASTOIDS: No acute abnormality. BONES AND SOFT TISSUES: Bone marrow signal is within normal limits. No acute soft tissue abnormality. Cervical spine degeneration is partially visible, including evidence of mild or moderate multifactorial spinal stenosis at C2-C3 related to disc and posterior ligamentous degeneration (series 7 image 13). IMPRESSION: 1. Punctate left body corpus callosum diffusion abnormality suspected, although not well correlated on T2 / FLAIR. This is nonspecific and possible etiologies include metabolic disturbance, small vessel  ischemia, seizure, infection, demyelination, or radiation and chemotherapy. 2. Otherwise Normal for age non-contrast MRI appearance of the brain. Electronically signed by: Helayne Hurst MD 10/04/2024 08:34 AM EST RP Workstation: [id number]   CT Head Wo Contrast Result Date: 10/03/2024 EXAM: CT HEAD WITHOUT CONTRAST 10/03/2024 06:39:09 PM TECHNIQUE: CT of the head was performed without the administration of intravenous contrast. Automated exposure control, iterative reconstruction, and/or weight based adjustment of the mA/kV was utilized to reduce the radiation dose to as low as reasonably achievable. COMPARISON: 09/15/2024 CLINICAL HISTORY: Delirium FINDINGS: BRAIN AND VENTRICLES: No acute hemorrhage. No evidence of acute infarct. No hydrocephalus. No extra-axial collection. No mass effect or midline shift. Generalized volume loss. Vascular calcifications. ORBITS: Bilateral lens replacement. SINUSES: No acute abnormality. SOFT TISSUES AND SKULL: No acute soft tissue abnormality. No skull fracture. IMPRESSION: 1. No acute intracranial abnormality. Electronically signed by: Norman Gatlin MD 10/03/2024 07:14 PM EST RP Workstation: [id number]   DG Chest Port 1 View Result Date: 10/03/2024 CLINICAL DATA:  Questionable sepsis EXAM: PORTABLE CHEST 1 VIEW COMPARISON:  Chest x-ray 09/15/2024 FINDINGS: There is some hazy and patchy opacities in the right lung base which are new from prior. There is likely a small right pleural effusion. Cardiomediastinal silhouette is within normal limits. There is no pneumothorax or acute fracture. IMPRESSION: New hazy and patchy opacities in the right lung base with likely small right pleural effusion. Findings are concerning for pneumonia. Electronically Signed   By: Greig Pique M.D.   On: 10/03/2024 18:41      Ardelia Wrede T. Coralyn Roselli [facility]  If 7PM-7AM, please contact night-coverage [email or web address] 10/04/2024, 10:36 AM

## 2024-10-04 NOTE — Plan of Care (Signed)
  Problem: Education: Goal: Ability to describe self-care measures that may prevent or decrease complications (Diabetes Survival Skills Education) will improve Outcome: Progressing   Problem: Coping: Goal: Ability to adjust to condition or change in health will improve Outcome: Progressing   Problem: Safety: Goal: Ability to remain free from injury will improve Outcome: Progressing

## 2024-10-05 ENCOUNTER — Inpatient Hospital Stay (HOSPITAL_COMMUNITY)
Admit: 2024-10-05 | Discharge: 2024-10-05 | Disposition: A | Discharge status: Home / Self Care | Attending: Neurology | Admitting: Neurology

## 2024-10-05 DIAGNOSIS — D649 Anemia, unspecified: Secondary | ICD-10-CM | POA: Diagnosis not present

## 2024-10-05 DIAGNOSIS — I10 Essential (primary) hypertension: Secondary | ICD-10-CM | POA: Diagnosis not present

## 2024-10-05 DIAGNOSIS — G9340 Encephalopathy, unspecified: Secondary | ICD-10-CM | POA: Diagnosis not present

## 2024-10-05 DIAGNOSIS — R4182 Altered mental status, unspecified: Secondary | ICD-10-CM

## 2024-10-05 DIAGNOSIS — R569 Unspecified convulsions: Secondary | ICD-10-CM | POA: Diagnosis not present

## 2024-10-05 DIAGNOSIS — C9210 Chronic myeloid leukemia, BCR/ABL-positive, not having achieved remission: Secondary | ICD-10-CM | POA: Diagnosis not present

## 2024-10-05 LAB — GLUCOSE, CAPILLARY
Glucose-Capillary: 120 mg/dL — ABNORMAL HIGH (ref 70–99)
Glucose-Capillary: 154 mg/dL — ABNORMAL HIGH (ref 70–99)
Glucose-Capillary: 124 mg/dL — ABNORMAL HIGH (ref 70–99)
Glucose-Capillary: 114 mg/dL — ABNORMAL HIGH (ref 70–99)

## 2024-10-05 LAB — COMPREHENSIVE METABOLIC PANEL WITH GFR
ALT: 7 U/L (ref 0–44)
AST: 28 U/L (ref 15–41)
Albumin: 2.9 g/dL — ABNORMAL LOW (ref 3.5–5.0)
Alkaline Phosphatase: 65 U/L (ref 38–126)
Anion gap: 12 (ref 5–15)
BUN: 10 mg/dL (ref 8–23)
CO2: 24 mmol/L (ref 22–32)
Calcium: 8.3 mg/dL — ABNORMAL LOW (ref 8.9–10.3)
Chloride: 100 mmol/L (ref 98–111)
Creatinine, Ser: 0.62 mg/dL (ref 0.44–1.00)
GFR, Estimated: >60 mL/min (ref >60)
Glucose, Bld: 131 mg/dL — ABNORMAL HIGH (ref 70–99)
Potassium: 3.7 mmol/L (ref 3.5–5.1)
Sodium: 136 mmol/L (ref 135–145)
Total Bilirubin: 0.5 mg/dL (ref 0.0–1.2)
Total Protein: 5.5 g/dL — ABNORMAL LOW (ref 6.5–8.1)

## 2024-10-05 LAB — PREPARE RBC (CROSSMATCH)

## 2024-10-05 LAB — CBC
HCT: 20.5 % — ABNORMAL LOW (ref 36.0–46.0)
Hemoglobin: 6.8 g/dL — CL (ref 12.0–15.0)
MCH: 28.6 pg (ref 26.0–34.0)
MCHC: 33.2 g/dL (ref 30.0–36.0)
MCV: 86.1 fL (ref 80.0–100.0)
Platelets: 118 K/uL — ABNORMAL LOW (ref 150–400)
RBC: 2.38 MIL/uL — ABNORMAL LOW (ref 3.87–5.11)
RDW: 16.8 % — ABNORMAL HIGH (ref 11.5–15.5)
WBC: 3.9 K/uL — ABNORMAL LOW (ref 4.0–10.5)
nRBC: 0 % (ref 0.0–0.2)

## 2024-10-05 LAB — BLOOD GAS, VENOUS
Acid-Base Excess: 1.3 mmol/L (ref 0.0–2.0)
Bicarbonate: 25.9 mmol/L (ref 20.0–28.0)
O2 Saturation: 76.7 %
Patient temperature: 36.9
pCO2, Ven: 40 mmHg — ABNORMAL LOW (ref 44–60)
pH, Ven: 7.42 (ref 7.25–7.43)
pO2, Ven: 43 mmHg (ref 32–45)

## 2024-10-05 LAB — LIPID PANEL
Cholesterol: 100 mg/dL (ref 0–200)
HDL: 11 mg/dL — ABNORMAL LOW (ref >40)
LDL Cholesterol: 54 mg/dL (ref 0–99)
Total CHOL/HDL Ratio: 9.1 ratio
Triglycerides: 173 mg/dL — ABNORMAL HIGH (ref <150)
VLDL: 35 mg/dL (ref 0–40)

## 2024-10-05 LAB — URINE CULTURE: Culture: NO GROWTH

## 2024-10-05 LAB — CK: Total CK: 31 U/L — ABNORMAL LOW (ref 38–234)

## 2024-10-05 LAB — MAGNESIUM: Magnesium: 2.1 mg/dL (ref 1.7–2.4)

## 2024-10-05 LAB — CORTISOL: Cortisol, Plasma: 23.8 ug/dL

## 2024-10-05 MED ORDER — MIDODRINE HCL 5 MG PO TABS
2.5000 mg | ORAL_TABLET | Freq: Three times a day (TID) | ORAL | Status: DC
Start: 2024-10-05 — End: 2024-10-05

## 2024-10-05 MED ORDER — PROSOURCE PLUS PO LIQD
30.0000 mL | Freq: Two times a day (BID) | ORAL | Status: DC
  Administered 2024-10-05 – 2024-10-06 (×2): 30 mL via ORAL
  Filled 2024-10-05 (×14): qty 30

## 2024-10-05 MED ORDER — SODIUM CHLORIDE 0.9% IV SOLUTION: Freq: Once | INTRAVENOUS | Status: AC

## 2024-10-05 MED ORDER — ADULT MULTIVITAMIN W/MINERALS CH
1.0000 | ORAL_TABLET | Freq: Every day | ORAL | Status: DC
  Administered 2024-10-06 – 2024-10-10 (×5): 1 via ORAL
  Filled 2024-10-05 (×7): qty 1

## 2024-10-05 MED ORDER — BOOST / RESOURCE BREEZE PO LIQD CUSTOM
1.0000 | Freq: Two times a day (BID) | ORAL | Status: DC
  Administered 2024-10-06 – 2024-10-08 (×3): 1 via ORAL

## 2024-10-05 MED ORDER — MIDODRINE HCL 5 MG PO TABS: 5.0000 mg | ORAL_TABLET | Freq: Three times a day (TID) | ORAL | Status: DC

## 2024-10-05 MED ORDER — MIDODRINE HCL 5 MG PO TABS
5.0000 mg | ORAL_TABLET | Freq: Three times a day (TID) | ORAL | Status: DC
  Administered 2024-10-06 – 2024-10-10 (×9): 5 mg via ORAL
  Filled 2024-10-05 (×12): qty 1

## 2024-10-05 NOTE — Progress Notes (Signed)
 PT Cancellation Note  Patient Details Name: Erica [name] MRN: [id number] DOB: 06-Dec-1945   Cancelled Treatment:    Reason Eval/Treat Not Completed: Patient at procedure or test/unavailable. Pt undergoing EEG when PT arrived at 1151 PT to return as schedule allows and continue to follow acutely.   [place], PT Acute Rehab   [place] VEAR Drone 10/05/2024, 12:25 PM

## 2024-10-05 NOTE — Progress Notes (Signed)
 Initial Nutrition Assessment  DOCUMENTATION CODES:   Obesity unspecified  INTERVENTION:  - Regular diet.  - Boost Breeze po BID, each supplement provides 250 kcal and 9 grams of protein - ProSource Plus po BID, each supplement provides 100 kcal and 15 grams of protein  - Encourage intake at all meals and of supplements.   - If patient continues to eat 0% of meals, consider calorie count.  - Multivitamin with minerals daily.  - Monitor weight trends.  NUTRITION DIAGNOSIS:   Inadequate oral intake related to poor appetite, lethargy/confusion as evidenced by meal completion < 25%.  GOAL:   Patient will meet greater than or equal to 90% of their needs  MONITOR:   PO intake, Supplement acceptance, Weight trends, Skin  REASON FOR ASSESSMENT:   Consult Assessment of nutrition requirement/status, Poor PO  ASSESSMENT:   78 y.o. 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 Admitted for sepsis d/t PNA and UTI and acute encephalopathy.   Patient sleeping in bed at time of visit, awoke to sound of voice.  She reports she was eating normally PTA with 3 meals a day and a good appetite. Also reports drinking Ensure at home.  Per EMR, weight appears stable since July however weight is exactly the same so question if weights were copied over.   Patient noted to be eating poorly since admission. Had 0% of meals yesterday and 0% of breakfast today. Did not want to eat a lunch per RN. Patient reporting something zapped her appetite.  Encouraged patient to try and order 3 meals a day and eat as much as tolerated. She does not want to receive Ensure during admission because it is now too sweet. Does not want to try Magic Cup or CIB either.  Discussed with RN. RN provided patient with Boost Breeze mixed with water and patient tolerating fairly.  Encouraged patient to continue to drink this and sip on other liquids with calories.  Will need to monitor intake closely. If continues to eat 0% of meals, may need to consider calorie count/Cortrak if aggressive interventions desired.   Medications reviewed and include: -  Labs reviewed:  HA1C 6.9 (as of 11/11) Blood Glucose 95-179 x24 hours  NUTRITION - FOCUSED PHYSICAL EXAM:  Flowsheet Row Most Recent Value  Orbital Region Mild depletion  Upper Arm Region No depletion  Thoracic and Lumbar Region No depletion  Buccal Region No depletion  Temple Region Moderate depletion  Clavicle Bone Region Mild depletion  Clavicle and Acromion Bone Region Mild depletion  Scapular Bone Region Unable to assess  Dorsal Hand Mild depletion  Patellar Region No depletion  Anterior Thigh Region No depletion  Posterior Calf Region No depletion  Edema (RD Assessment) None  Hair Reviewed  Eyes Reviewed  Mouth Reviewed  Skin Reviewed  Nails Reviewed    Diet Order:   Diet Order             Diet regular Room service appropriate? Yes; Fluid consistency: Thin  Diet effective now                   EDUCATION NEEDS:  Education needs have been addressed  Skin:  Skin Assessment: Skin Integrity Issues: Skin Integrity Issues:: Stage II Stage II: Right Buttocks  Last BM:  11/11 per pt  Height:  Ht Readings from Last 1 Encounters:  10/04/24 4' 11 (1.499  m)   Weight:  Wt Readings from Last 1 Encounters:  10/04/24 72.8 kg   Ideal Body Weight:  44.7 kg  BMI:  Body mass index is 32.42 kg/m.  Estimated Nutritional Needs:  Kcal:  1750-1950 kcals Protein:  80-95 grams Fluid:  >/= 1.7L    Trude Ned RD, LDN Contact via Secure Chat.

## 2024-10-05 NOTE — Progress Notes (Signed)
 EEG complete - results pending

## 2024-10-05 NOTE — Progress Notes (Signed)
 PROGRESS NOTE  Erica [name] FMW:[id number] DOB: [birth year]   PCP: Arloa Elsie SAUNDERS, MD  Patient is from: Home.  Lives alone but recently his son moved in with her.  Has rolling walker.  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   In ED, febrile to 101.4.  CXR concerning for pneumonia.  UA concerning for UTI.  Hgb 6.5 (from 9.3 on 10/30).  Hemoccult negative.  TSH, B12, and ammonia unrevealing CT head without significant finding.  Cultures obtained.  Started on ceftriaxone and Zithromax.  MRI brain and lower extremity venous Doppler ordered.  The next day, MRI brain with punctate left body corpus callosum diffusion abnormality, nonspecific.  Seems to have left pronator drift/left arm weakness.  Neurology consulted and recommended low-dose aspirin , EEG, VBG and lipid panel.  Patient has improved some from weakness and encephalopathy standpoint.    Subjective: Seen and examined earlier this morning.  No major events overnight or this morning.  No complaints.  She denies pain, shortness of breath, cough or UTI symptoms.  She is awake and alert and oriented x 4 except date.   Assessment and plan: Symptomatic anemia with history of CML: Admitted about 3 weeks ago and transfused 2 units with appropriate response.  Hemoccult negative last and this admission.  No report of overt bleeding.  Low bilirubin argues against hemolysis.  Transfused 1 unit on 11/10 with appropriate response.  Hgb dropped down to 6.8 again likely hemodilution from IV fluid.  No  nutritional deficiency on anemia panel. Recent Labs    09/17/24 0116 09/17/24 0814 09/18/24 0542 09/19/24 0514 09/20/24 0523 09/21/24 0552 09/22/24 0546 10/03/24 1502 10/04/24 0431 10/05/24 0426  HGB 9.0* 9.5* 8.8* 8.5* 8.9* 8.7* 9.3* 6.5* 7.8* 6.8*  - Transfuse additional 1 unit - Discontinue IV fluid - Recheck CBC in the morning  Sepsis due to pneumonia and UTI: Present on admission.  Tachycardic and febrile.  No report of respiratory or UTI symptoms but she is not a great historian.  CXR with new hazy patchy opacities in right lung base with small right pleural effusion concerning for pneumonia.  UA with bacteria and trace LE.  Blood and urine culture NGTD -Continue ceftriaxone and Zithromax. -Incentive spirometry.  Acute encephalopathy: Multifactorial clearing infection, underlying cognitive impairment, adverse effect of medication and possible CVA.  She seems to have left pronator drift and left arm weakness.  Not sure if this is acute or chronic.  MRI brain as above.  B12, TSH, ammonia and VBG reassuring.  Encephalopathy improved.  She is oriented x 4 except date. - Treat infection and anemia as above - Continue holding Ponatinib . - Follow EEG. - Appreciate input by neurology  Abnormal MRI brain: MRI brain with punctate left body corpus callosum diffusion abnormality, nonspecific.  I do not think this would explain patient's presentation.  Evaluated by neurology. - Started on low-dose aspirin .  LDL 54.  Left arm weakness/pronator drift: Likely from encephalopathy.  MRI brain as above.  Resolving. - Continue therapy - Treat treatable causes.  Generalized weakness/physical deconditioning Fall at home-no apparent injury. 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  Follows with oncology at [facility].  Last visit on 08/26/2024.  Question if his symptoms are from Ponatinib  or CNS involvement of CML -Continue holding Ponatinib .  Chronic HFpEF: TTE in 10/2023 with LVEF of  75% and G1 DD.  Appears euvolemic on exam.  No cardiopulmonary symptoms.  Trace BLE edema. - Continue holding diuretics - Monitor fluid and respiratory status.  NIDDM-2: A1c 6.9%.  Not on meds at home. -Continue CBG monitoring and SSI  Thrombocytopenia: Stable -Continue monitoring  Hypotension: BP remains soft.  Not on antihypertensive meds.  TSH and a.m. cortisol within normal. - Start low-dose midodrine .  Class I obesity/poor p.o. intake Body mass index is 32.42 kg/m. - Consult dietitian.       Pressure skin injury: Present on admission. Wound 10/05/24 0356 Pressure Injury Buttocks Right Stage 2 -  Partial thickness loss of dermis presenting as a shallow open injury with a red, pink wound bed without slough. (Active)   DVT prophylaxis:  enoxaparin  (LOVENOX ) injection 40 mg Start: 10/03/24 2215  Code Status: Full code Family Communication: Updated patient's son at bedside Level of care: Telemetry Status is: Inpatient Remains inpatient appropriate because: Acute encephalopathy, weakness, pneumonia, UTI   Final disposition: To be determined   55 minutes with more than 50% spent in reviewing records, counseling patient/family and coordinating care.  Consultants:  Neurology  Procedures: None  Microbiology summarized: COVID 19, influenza and RSV PCR nonreactive Urine culture NGTD Blood cultures NGTD  Objective: Vitals:   10/05/24 0338 10/05/24 0756 10/05/24 1243 10/05/24 1309  BP:  (!) 107/52 (!) 106/51 (!) 98/50  Pulse: (!) 109 99 98 98  Resp:  18 20   Temp:  98.9 F (37.2 C) 99.3 F (37.4 C) 99.7 F (37.6 C)  TempSrc:  Oral Oral Oral  SpO2: 90% 94% 93% 91%  Weight:      Height:        Examination:  GENERAL: No apparent distress.  Nontoxic. HEENT: MMM.  Vision and hearing grossly intact.  NECK: Supple.  No apparent JVD.  RESP:  No IWOB.  Fair aeration bilaterally. CVS:  RRR. Heart sounds normal.  ABD/GI/GU: BS+. Abd soft, NTND.  MSK/EXT:  Moves  extremities. No apparent deformity. No edema.  SKIN: no apparent skin lesion or wound NEURO: Awake and alert.  Oriented to self, place and person. Cranial nerves II-XII intact.  Weakness in left arm (3+/5).  Light sensation intact in all dermatomes. Patellar reflex symmetric.  Left pronator drift.  Not able to perform finger-to-nose task with left arm.  No nystagmus. PSYCH: Calm. Normal affect.   Sch Meds:  Scheduled Meds:  sodium chloride    Intravenous Once   aspirin  EC  81 mg Oral Daily   enoxaparin  (LOVENOX ) injection  40 mg Subcutaneous Q24H   folic acid  1 mg Oral Daily   insulin  aspart  0-6 Units Subcutaneous TID WC   latanoprost   1 drop Both Eyes QHS   midodrine   5 mg Oral TID WC   Continuous Infusions:  azithromycin 500 mg (10/05/24 0855)   cefTRIAXone (ROCEPHIN)  IV 1 g (10/05/24 0854)   PRN Meds:.acetaminophen  **OR** acetaminophen , ondansetron  (ZOFRAN ) IV  Antimicrobials: Anti-infectives (From admission, onward)    Start     Dose/Rate Route Frequency Ordered Stop   10/04/24 1000  cefTRIAXone (ROCEPHIN) 1 g in sodium chloride  0.9 % 100 mL IVPB        1 g 200 mL/hr over 30 Minutes  Intravenous Every 24 hours 10/03/24 2208     10/04/24 1000  azithromycin (ZITHROMAX) 500 mg in sodium chloride  0.9 % 250 mL IVPB        500 mg 250 mL/hr over 60 Minutes Intravenous Every 24 hours 10/03/24 2209     10/03/24 1930  cefTRIAXone (ROCEPHIN) 2 g in sodium chloride  0.9 % 100 mL IVPB        2 g 200 mL/hr over 30 Minutes Intravenous  Once 10/03/24 1916 10/03/24 2055   10/03/24 1930  azithromycin (ZITHROMAX) 500 mg in sodium chloride  0.9 % 250 mL IVPB        500 mg 250 mL/hr over 60 Minutes Intravenous  Once 10/03/24 1916 10/04/24 0819        I have personally reviewed the following labs and images: CBC: Recent Labs  Lab 10/03/24 1502 10/04/24 0431 10/05/24 0426  WBC 4.7 4.2 3.9*  HGB 6.5* 7.8* 6.8*  HCT 19.2* 23.7* 20.5*  MCV 86.1 86.5 86.1  PLT 149* 126* 118*   BMP  &GFR Recent Labs  Lab 10/03/24 1502 10/04/24 0431 10/05/24 0426  NA 133* 135 136  K 4.3 3.9 3.7  CL 95* 97* 100  CO2 26 24 24   GLUCOSE 153* 104* 131*  BUN 13 11 10   CREATININE 0.67 0.62 0.62  CALCIUM 9.0 8.6* 8.3*  MG  --   --  2.1   Estimated Creatinine Clearance: 51.1 mL/min (by C-G formula based on SCr of 0.62 mg/dL). Liver & Pancreas: Recent Labs  Lab 10/03/24 1502 10/04/24 0431 10/05/24 0426  AST 37 35 28  ALT 9 7 7   ALKPHOS 85 80 65  BILITOT 0.7 0.6 0.5  PROT 6.4* 5.9* 5.5*  ALBUMIN 3.3* 3.0* 2.9*   No results for input(s): LIPASE, AMYLASE in the last 168 hours. Recent Labs  Lab 10/03/24 1850  AMMONIA 14   Diabetic: Recent Labs    10/04/24 0434  HGBA1C 6.9*   Recent Labs  Lab 10/04/24 1138 10/04/24 1644 10/04/24 2104 10/05/24 0750 10/05/24 1248  GLUCAP 106* 99 179* 120* 154*   Cardiac Enzymes: Recent Labs  Lab 10/05/24 0426  CKTOTAL 31*   No results for input(s): PROBNP in the last 8760 hours. Coagulation Profile: No results for input(s): INR, PROTIME in the last 168 hours. Thyroid Function Tests: Recent Labs    10/04/24 0431  TSH 1.840   Lipid Profile: Recent Labs    10/05/24 0426  CHOL 100  HDL 11*  LDLCALC 54  TRIG 826*  CHOLHDL 9.1   Anemia Panel: Recent Labs    10/04/24 0431 10/04/24 0434  VITAMINB12 1,322*  --   FOLATE  --  10.3  FERRITIN  --  2,409*  TIBC  --  217*  IRON  --  180*  RETICCTPCT  --  0.4   Urine analysis:    Component Value Date/Time   COLORURINE AMBER (A) 10/03/2024 2055   APPEARANCEUR TURBID (A) 10/03/2024 2055   LABSPEC 1.023 10/03/2024 2055   PHURINE 5.0 10/03/2024 2055   GLUCOSEU NEGATIVE 10/03/2024 2055   HGBUR NEGATIVE 10/03/2024 2055   BILIRUBINUR SMALL (A) 10/03/2024 2055   KETONESUR 20 (A) 10/03/2024 2055   PROTEINUR 100 (A) 10/03/2024 2055   NITRITE NEGATIVE 10/03/2024 2055   LEUKOCYTESUR TRACE (A) 10/03/2024 2055   Sepsis Labs: Invalid input(s): PROCALCITONIN,  LACTICIDVEN  Microbiology: Recent Results (from the past 240 hours)  Blood Culture (routine x 2)     Status: None (Preliminary result)   Collection Time:  10/03/24  5:51 PM   Specimen: BLOOD  Result Value Ref Range Status   Specimen Description   Final    BLOOD LEFT ANTECUBITAL Performed at [facility], [address], [place], [place] [address]    Special Requests   Final    BOTTLES DRAWN AEROBIC AND ANAEROBIC Blood Culture adequate volume Performed at [facility], [address], [place], [place] [address]    Culture   Final    NO GROWTH 2 DAYS Performed at [facility], [address], [place], [place] [address]    Report Status PENDING  Incomplete  Resp panel by RT-PCR (RSV, Flu A&B, Covid) Anterior Nasal Swab     Status: None   Collection Time: 10/03/24  6:50 PM   Specimen: Anterior Nasal Swab  Result Value Ref Range Status   SARS Coronavirus 2 by RT PCR NEGATIVE NEGATIVE Final    Comment: (NOTE) SARS-CoV-2 target nucleic acids are NOT DETECTED.  The SARS-CoV-2 RNA is generally detectable in upper respiratory specimens during the acute phase of infection. The lowest concentration of SARS-CoV-2 viral copies this assay can detect is 138 copies/mL. A negative result does not preclude SARS-Cov-2 infection and should not be used as the sole basis for treatment or other patient management decisions. A negative result may occur with  improper specimen collection/handling, submission of specimen other than nasopharyngeal swab, presence of viral mutation(s) within the areas targeted by this assay, and inadequate number of viral copies(<138 copies/mL). A negative result must be combined with clinical observations, patient history, and epidemiological information. The expected result is Negative.  Fact Sheet for Patients:  bloggercourse.com  Fact Sheet for Healthcare Providers:   seriousbroker.it  This test is no t yet approved or cleared by the [place]  FDA and  has been authorized for detection and/or diagnosis of SARS-CoV-2 by FDA under an Emergency Use Authorization (EUA). This EUA will remain  in effect (meaning this test can be used) for the duration of the COVID-19 declaration under Section 564(b)(1) of the Act, 21 U.S.C.section 360bbb-3(b)(1), unless the authorization is terminated  or revoked sooner.       Influenza A by PCR NEGATIVE NEGATIVE Final   Influenza B by PCR NEGATIVE NEGATIVE Final    Comment: (NOTE) The Xpert Xpress SARS-CoV-2/FLU/RSV plus assay is intended as an aid in the diagnosis of influenza from Nasopharyngeal swab specimens and should not be used as a sole basis for treatment. Nasal washings and aspirates are unacceptable for Xpert Xpress SARS-CoV-2/FLU/RSV testing.  Fact Sheet for Patients: bloggercourse.com  Fact Sheet for Healthcare Providers: seriousbroker.it  This test is not yet approved or cleared by the [place]  FDA and has been authorized for detection and/or diagnosis of SARS-CoV-2 by FDA under an Emergency Use Authorization (EUA). This EUA will remain in effect (meaning this test can be used) for the duration of the COVID-19 declaration under Section 564(b)(1) of the Act, 21 U.S.C. section 360bbb-3(b)(1), unless the authorization is terminated or revoked.     Resp Syncytial Virus by PCR NEGATIVE NEGATIVE Final    Comment: (NOTE) Fact Sheet for Patients: bloggercourse.com  Fact Sheet for Healthcare Providers: seriousbroker.it  This test is not yet approved or cleared by the [place]  FDA and has been authorized for detection and/or diagnosis of SARS-CoV-2 by FDA under an Emergency Use Authorization (EUA). This EUA will remain in effect (meaning this test can be used) for  the duration of the COVID-19 declaration under Section 564(b)(1)  of the Act, 21 U.S.C. section 360bbb-3(b)(1), unless the authorization is terminated or revoked.  Performed at [facility], [address], [place], [place] [address]   Urine Culture     Status: None   Collection Time: 10/03/24  8:55 PM   Specimen: Urine, Random  Result Value Ref Range Status   Specimen Description   Final    URINE, RANDOM Performed at [facility], [address] [place], [place] [address]    Special Requests   Final    NONE Reflexed from [id number] Performed at [facility], [address] [place], [place] [address]    Culture   Final    NO GROWTH Performed at [facility], [address], [place], [place] [address]    Report Status 10/05/2024 FINAL  Final  Blood Culture (routine x 2)     Status: None (Preliminary result)   Collection Time: 10/04/24  4:31 AM   Specimen: BLOOD LEFT HAND  Result Value Ref Range Status   Specimen Description   Final    BLOOD LEFT HAND Performed at [facility], [address], [place], [place] [address]    Special Requests   Final    Blood Culture adequate volume BOTTLES DRAWN AEROBIC AND ANAEROBIC Performed at [facility], [address] [place], [place] [address]    Culture   Final    NO GROWTH < 24 HOURS Performed at [facility], [address] [place], [place] [address]    Report Status PENDING  Incomplete    Radiology Studies: No results found.     Novalie Leamy T. Marjory Meints [facility]  If 7PM-7AM, please contact night-coverage [email or web address] 10/05/2024, 1:47 PM

## 2024-10-05 NOTE — Procedures (Signed)
 Patient Name: Erica [name]  MRN: [id number]  Epilepsy Attending: Arlin MALVA Krebs  Referring Physician/Provider: Michaela Aisha SQUIBB, MD  Date: 10/05/2024 Duration: 24 minutes  Patient history: 78 year old female with altered mental status.  EEG to assess for seizure.  Level of alertness: Awake, asleep  AEDs during EEG study: None  Technical aspects: This EEG study was done with scalp electrodes positioned according to the 10-20 International system of electrode placement. Electrical activity was reviewed with band pass filter of 1-70Hz , sensitivity of 7 uV/mm, display speed of 77mm/sec with a 60Hz  notched filter applied as appropriate. EEG data were recorded continuously and digitally stored.  Video monitoring was available and reviewed as appropriate.  Description: The posterior dominant rhythm consists of 8-9Hz  activity of moderate voltage (25-35 uV) seen predominantly in posterior head regions, symmetric and reactive to eye opening and eye closing. Sleep was characterized by vertex waves, sleep spindles (12 to 14 Hz), maximal frontocentral region. Hyperventilation and photic stimulation were not performed.     IMPRESSION: This study is within normal limits. No seizures or epileptiform discharges were seen throughout the recording.  A normal interictal EEG does not exclude the diagnosis of epilepsy.   Amadea Keagy O Jesiah Grismer

## 2024-10-05 NOTE — Evaluation (Signed)
 Occupational Therapy Evaluation Patient Details Name: Erica [name] MRN: [id number] DOB: 1946-06-04 Today's Date: 10/05/2024   History of Present Illness   Erica [name] is a 78 yr old female admitted to the hospital with weakness, confusion, decreased appetite and peripheral edema. She was found to have symptomatic anemia, acute encephalopathy and sepsis due to PNA and UTI. PMH: CML, recent hospital admission due to anemia and sepsis, DM, eczema, glaucoma, HTN, MVP, chronic diastolic heart failure     Clinical Impressions The pt is currently presenting below her baseline level of functioning for self-care management. She is limited by the below listed deficits (see OT problem list). At current, she requires min to mod assist for tasks such as lower body dressing, sit to stand using a RW, and toileting management at bedside commode level. She will benefit from further OT services to maximize her safety and independence with ADLs and to decrease the risk for restricted participation in meaningful activities. She stated, she has a niece who is able and willing to stay with her if needed at discharge. She may progress to returning home with family support and home health OT however short-term SNF rehab is recommended at current, given her current functional limitations and impaired occupational performance.      If plan is discharge home, recommend the following:   A little help with walking and/or transfers;A little help with bathing/dressing/bathroom;Assist for transportation;Assistance with cooking/housework;Help with stairs or ramp for entrance;Direct supervision/assist for medications management;Direct supervision/assist for financial management     Functional Status Assessment   Patient has had a recent decline in their functional status and demonstrates the ability to make significant improvements in function in a reasonable and predictable amount of time.     Equipment  Recommendations   BSC/3in1;Tub/shower seat     Recommendations for Other Services         Precautions/Restrictions   Precautions Precautions: Fall Restrictions Weight Bearing Restrictions Per Provider Order: No     Mobility Bed Mobility Overal bed mobility: Needs Assistance Bed Mobility: Supine to Sit, Sit to Supine     Supine to sit: Min assist, Used rails, HOB elevated Sit to supine: Min assist        Transfers Overall transfer level: Needs assistance Equipment used: Rolling walker (2 wheels) Transfers: Bed to chair/wheelchair/BSC, Sit to/from Stand Sit to Stand: Min assist, From elevated surface Stand pivot transfers: Min assist                Balance     Sitting balance-Leahy Scale: Fair       Standing balance-Leahy Scale:  (Min assist with RW)                             ADL either performed or assessed with clinical judgement   ADL Overall ADL's : Needs assistance/impaired Eating/Feeding: Set up;Sitting;Supervision/ safety   Grooming: Set up;Supervision/safety;Sitting   Upper Body Bathing: Minimal assistance;Sitting   Lower Body Bathing: Moderate assistance;Sitting/lateral leans;Sit to/from stand   Upper Body Dressing : Set up;Supervision/safety;Sitting   Lower Body Dressing: Moderate assistance;Sitting/lateral leans Lower Body Dressing Details (indicate cue type and reason): for sock management seated EOB Toilet Transfer: Minimal assistance;BSC/3in1;Rolling walker (2 wheels);Cueing for sequencing;Stand-pivot;Cueing for safety   Toileting- Clothing Manipulation and Hygiene: Minimal assistance;Sit to/from stand;Cueing for safety;Cueing for sequencing Toileting - Clothing Manipulation Details (indicate cue type and reason): toileting management performed at bedside commode level  Vision Baseline Vision/History: 3 Glaucoma Additional Comments: She correctly read the time depicted on the wall clock.      Perception         Praxis         Pertinent Vitals/Pain Pain Assessment Pain Assessment: No/denies pain     Extremity/Trunk Assessment Upper Extremity Assessment Upper Extremity Assessment: Right hand dominant;LUE deficits/detail;Generalized weakness LUE Deficits / Details: possible chronic L shoulder AROM limitations; AROM for shoulder flexion <110 degrees. Elbow and hand AROM WFL. Grip strength 4-/5   Lower Extremity Assessment Lower Extremity Assessment: Generalized weakness       Communication     Cognition Arousal: Lethargic Behavior During Therapy: Flat affect Cognition: Cognition impaired         Attention impairment (select first level of impairment): Divided attention, Selective attention Executive functioning impairment (select all impairments): Initiation, Problem solving, Sequencing                   Following commands: Impaired Following commands impaired: Follows one step commands with increased time, Follows multi-step commands inconsistently                Home Living Family/patient expects to be discharged to:: Private residence Living Arrangements: Alone Available Help at Discharge: Family Type of Home: House Home Access: Level entry     Home Layout: One level     Bathroom Shower/Tub: Runner, Broadcasting/film/video: None          Prior Functioning/Environment Prior Level of Function : Independent/Modified Independent;Driving;Working/employed             Mobility Comments: Independent with ambulation. 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    OT Problem List: Decreased strength;Decreased activity tolerance;Impaired balance (sitting and/or standing);Decreased cognition;Decreased knowledge of use of DME or AE;Decreased range of motion   OT Treatment/Interventions: Self-care/ADL training;Therapeutic exercise;DME  and/or AE instruction;Energy conservation;Therapeutic activities;Patient/family education;Balance training;Cognitive remediation/compensation      OT Goals(Current goals can be found in the care plan section)   Acute Rehab OT Goals OT Goal Formulation: With patient Time For Goal Achievement: 10/19/24 Potential to Achieve Goals: Good ADL Goals Pt Will Perform Upper Body Dressing: with set-up;sitting Pt Will Perform Lower Body Dressing: with set-up;with supervision;sit to/from stand Pt Will Transfer to Toilet: with supervision;ambulating Pt Will Perform Toileting - Clothing Manipulation and hygiene: with supervision;sit to/from stand   OT Frequency:  Min 2X/week       AM-PAC OT 6 Clicks Daily Activity     Outcome Measure Help from another person eating meals?: None Help from another person taking care of personal grooming?: A Little Help from another person toileting, which includes using toliet, bedpan, or urinal?: A Little Help from another person bathing (including washing, rinsing, drying)?: A Lot Help from another person to put on and taking off regular upper body clothing?: A Little Help from another person to put on and taking off regular lower body clothing?: A Lot 6 Click Score: 17   End of Session Equipment Utilized During Treatment: Gait belt;Rolling walker (2 wheels) Nurse Communication: Mobility status  Activity Tolerance: Patient limited by lethargy Patient left: in bed;with call bell/phone within reach;with bed alarm set  OT Visit Diagnosis: Unsteadiness on feet (R26.81);Other abnormalities of gait and mobility (R26.89);Muscle weakness (generalized) (M62.81)                Time: 8474-8452  OT Time Calculation (min): 22 min Charges:  OT General Charges $OT Visit: 1 Visit OT Evaluation $OT Eval Moderate Complexity: 1 Mod    Aleisa Howk J Harris, OTR/L 10/05/2024, 5:50 PM

## 2024-10-05 NOTE — Progress Notes (Signed)
 EEG without concerning findings.  At this point I suspect that this likely represents delirium in the setting of her medical issues, and neurology will be available on an as needed basis.  Please call with further questions or concerns.  Aisha Seals, MD [facility]   If 7pm- 7am, please page neurology on call as listed in AMION.

## 2024-10-06 ENCOUNTER — Inpatient Hospital Stay (HOSPITAL_COMMUNITY)

## 2024-10-06 DIAGNOSIS — G9340 Encephalopathy, unspecified: Secondary | ICD-10-CM | POA: Diagnosis not present

## 2024-10-06 LAB — BPAM RBC
Blood Product Expiration Date: 202512122359
Blood Product Expiration Date: 202512122359
ISSUE DATE / TIME: 202511121242
ISSUE DATE / TIME: 202511102138
Unit Type and Rh: 5100
Unit Type and Rh: 5100

## 2024-10-06 LAB — TYPE AND SCREEN
ABO/RH(D): O POS
Antibody Screen: NEGATIVE
Unit division: 0
Unit division: 0

## 2024-10-06 LAB — CBC
HCT: 26.5 % — ABNORMAL LOW (ref 36.0–46.0)
Hemoglobin: 9.2 g/dL — ABNORMAL LOW (ref 12.0–15.0)
MCH: 29.4 pg (ref 26.0–34.0)
MCHC: 34.7 g/dL (ref 30.0–36.0)
MCV: 84.7 fL (ref 80.0–100.0)
Platelets: 103 K/uL — ABNORMAL LOW (ref 150–400)
RBC: 3.13 MIL/uL — ABNORMAL LOW (ref 3.87–5.11)
RDW: 17 % — ABNORMAL HIGH (ref 11.5–15.5)
WBC: 3.8 K/uL — ABNORMAL LOW (ref 4.0–10.5)
nRBC: 0 % (ref 0.0–0.2)

## 2024-10-06 LAB — RENAL FUNCTION PANEL
Albumin: 3 g/dL — ABNORMAL LOW (ref 3.5–5.0)
Anion gap: 16 — ABNORMAL HIGH (ref 5–15)
BUN: 10 mg/dL (ref 8–23)
CO2: 21 mmol/L — ABNORMAL LOW (ref 22–32)
Calcium: 8.6 mg/dL — ABNORMAL LOW (ref 8.9–10.3)
Chloride: 98 mmol/L (ref 98–111)
Creatinine, Ser: 0.57 mg/dL (ref 0.44–1.00)
GFR, Estimated: >60 mL/min (ref >60)
Glucose, Bld: 97 mg/dL (ref 70–99)
Phosphorus: 2.3 mg/dL — ABNORMAL LOW (ref 2.5–4.6)
Potassium: 3.7 mmol/L (ref 3.5–5.1)
Sodium: 135 mmol/L (ref 135–145)

## 2024-10-06 LAB — GLUCOSE, CAPILLARY
Glucose-Capillary: 93 mg/dL (ref 70–99)
Glucose-Capillary: 99 mg/dL (ref 70–99)
Glucose-Capillary: 74 mg/dL (ref 70–99)
Glucose-Capillary: 82 mg/dL (ref 70–99)

## 2024-10-06 LAB — MAGNESIUM: Magnesium: 2.1 mg/dL (ref 1.7–2.4)

## 2024-10-06 NOTE — NC FL2 (Signed)
 [place]  MEDICAID FL2 LEVEL OF CARE FORM     IDENTIFICATION  Patient Name: Erica [name] Birthdate: [birth year] Sex: female Admission Date (Current Location): 10/03/2024  [facility] and [place] Number:  Producer, Television/film/video and Address:  [facility],  [address], [place] [address]      Provider Number: [id number]  Attending Physician Name and Address:  Raenelle Coria, MD  Relative Name and Phone Number:       Current Level of Care: Hospital Recommended Level of Care: Skilled Nursing Facility Prior Approval Number:    Date Approved/Denied:   PASRR Number: [id number]  Discharge Plan: SNF    Current Diagnoses: Patient Active Problem List   Diagnosis Date Noted   Acute encephalopathy 10/03/2024   Acute on chronic heart failure with preserved ejection fraction (HFpEF) (HCC) 10/03/2024   UTI (urinary tract infection) 10/03/2024   Pneumonia 10/03/2024   Chronic myeloid leukemia (HCC) 09/16/2024   Fever of unknown origin 09/16/2024   Esophagitis 09/16/2024   Diabetes mellitus type 2 in nonobese (HCC) 09/16/2024   Essential hypertension 09/16/2024   Peripheral neuropathy 09/16/2024   Altered mental status 09/16/2024   SIRS (systemic inflammatory response syndrome) (HCC) 09/16/2024   Symptomatic anemia 09/15/2024   Diastolic heart failure (HCC) 12/01/2023   Pleural effusion on right 11/19/2023   Pleural effusion 11/02/2023    Orientation RESPIRATION BLADDER Height & Weight     Self, Situation, Place  Normal Continent Weight: 160 lb 7.9 oz (72.8 kg) Height:  4' 11 (149.9 cm)  BEHAVIORAL SYMPTOMS/MOOD NEUROLOGICAL BOWEL NUTRITION STATUS      Continent Diet (regular)  AMBULATORY STATUS COMMUNICATION OF NEEDS Skin   Limited Assist Verbally PU Stage and Appropriate Care (buttocks, right)                       Personal Care Assistance Level of Assistance  Bathing, Feeding, Dressing Bathing Assistance: Limited assistance Feeding assistance:  Independent Dressing Assistance: Limited assistance     Functional Limitations Info  Sight, Hearing, Speech Sight Info: Impaired (glasses) Hearing Info: Adequate Speech Info: Impaired    SPECIAL CARE FACTORS FREQUENCY  PT (By licensed PT), OT (By licensed OT)     PT Frequency: 5 x a week OT Frequency: 5 x a week            Contractures Contractures Info: Not present    Additional Factors Info  Code Status, Allergies Code Status Info: full Allergies Info: Sulfa Antibiotics,Penicillins           Current Medications (10/06/2024):  This is the current hospital active medication list Current Facility-Administered Medications  Medication Dose Route Frequency Provider Last Rate Last Admin   (feeding supplement) PROSource Plus liquid 30 mL  30 mL Oral BID BM Gonfa, Taye T, MD   30 mL at 10/05/24 1902   acetaminophen  (TYLENOL ) tablet 650 mg  650 mg Oral Q6H PRN Kakrakandy, Arshad N, MD   650 mg at 10/06/24 1247   Or   acetaminophen  (TYLENOL ) suppository 650 mg  650 mg Rectal Q6H PRN Franky Redia SAILOR, MD       aspirin  EC tablet 81 mg  81 mg Oral Daily Michaela Aisha SQUIBB, MD   81 mg at 10/06/24 1027   azithromycin (ZITHROMAX) 500 mg in sodium chloride  0.9 % 250 mL IVPB  500 mg Intravenous Q24H Franky Redia SAILOR, MD 250 mL/hr at 10/06/24 1027 500 mg at 10/06/24 1027   cefTRIAXone (ROCEPHIN) 1 g in  sodium chloride  0.9 % 100 mL IVPB  1 g Intravenous Q24H Kakrakandy, Arshad N, MD 200 mL/hr at 10/06/24 1026 1 g at 10/06/24 1026   enoxaparin  (LOVENOX ) injection 40 mg  40 mg Subcutaneous Q24H Franky Redia SAILOR, MD   40 mg at 10/05/24 2124   feeding supplement (BOOST / RESOURCE BREEZE) liquid 1 Container  1 Container Oral BID BM Gonfa, Taye T, MD   1 Container at 10/06/24 1355   folic acid (FOLVITE) tablet 1 mg  1 mg Oral Daily Kakrakandy, Arshad N, MD   1 mg at 10/06/24 1027   insulin  aspart (novoLOG ) injection 0-6 Units  0-6 Units Subcutaneous TID WC Franky Redia SAILOR, MD        latanoprost  (XALATAN ) 0.005 % ophthalmic solution 1 drop  1 drop Both Eyes QHS Kakrakandy, Arshad N, MD   1 drop at 10/05/24 2242   midodrine  (PROAMATINE ) tablet 5 mg  5 mg Oral TID WC Gonfa, Taye T, MD   5 mg at 10/06/24 1247   multivitamin with minerals tablet 1 tablet  1 tablet Oral Daily Gonfa, Taye T, MD   1 tablet at 10/06/24 1027   ondansetron  (ZOFRAN ) injection 4 mg  4 mg Intravenous Q6H PRN Gonfa, Taye T, MD   4 mg at 10/04/24 1054     Discharge Medications: Please see discharge summary for a list of discharge medications.  Relevant Imaging Results:  Relevant Lab Results:   Additional Information SSN:[id number]  Tawni HERO Syenna Nazir, LCSW

## 2024-10-06 NOTE — Evaluation (Signed)
 Physical Therapy Evaluation Patient Details Name: Erica [name] MRN: [id number] DOB: Mar 13, 1946 Today's Date: 10/06/2024  History of Present Illness  Erica [name] is a 78 yo female admitted to the hospital with weakness, confusion, decreased appetite and peripheral edema. She was found to have symptomatic anemia, acute encephalopathy and sepsis due to PNA and UTI. PMH: CML, recent hospital admission due to anemia and sepsis, DM, eczema, glaucoma, HTN, MVP, chronic diastolic heart failure  Clinical Impression  Pt admitted with above diagnosis. 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 On eval, BLE symmetrical, denies numbness/tingling throughout. Pt needing min A to power to stand, hand over hand cues for hand placement to power up and avoid pulling from RW. Pt amb 60 ft with RW, min A consistently for general unsteadiness, veering to R requiring assist to manage RW, shuffling gait pattern, appears equal bil step length, recliner follow but pt not needing it. Upon return to room, pt needing min A to lift BLE back into bed, requesting to take a nap. Patient will benefit from continued inpatient follow up therapy, <3 hours/day. Pt currently with functional limitations due to the deficits listed below (see PT Problem List). Pt will benefit from acute skilled PT to increase their independence and safety with mobility to allow discharge.           If plan is discharge home, recommend the following: A little help with walking and/or transfers;A little help with bathing/dressing/bathroom;Assistance with cooking/housework;Assist for transportation   Can travel by private vehicle   Yes    Equipment Recommendations Rolling  walker (2 wheels)  Recommendations for Other Services       Functional Status Assessment Patient has had a recent decline in their functional status and demonstrates the ability to make significant improvements in function in a reasonable and predictable amount of time.     Precautions / Restrictions Precautions Precautions: Fall Restrictions Weight Bearing Restrictions Per Provider Order: No      Mobility  Bed Mobility Overal bed mobility: Needs Assistance Bed Mobility: Sit to Supine       Sit to supine: Min assist   General bed mobility comments: min A to lift BLE back into bed and reposition to comfort    Transfers Overall transfer level: Needs assistance Equipment used: Rolling walker (2 wheels) Transfers: Sit to/from Stand, Bed to chair/wheelchair/BSC Sit to Stand: Min assist   Step pivot transfers: Min assist       General transfer comment: min A to power up and steady with STS and step pivot, performed from recliner, BSC and bed, hand over hand cues for proper hand placement    Ambulation/Gait Ambulation/Gait assistance: Min assist Gait Distance (Feet): 60 Feet Assistive device: Rolling walker (2 wheels) Gait Pattern/deviations: Shuffle, Drifts right/left, Narrow base of support, Decreased step length - right, Decreased step length - left Gait velocity: decreased     General Gait Details: narrow BOS with shuffling to minimal bil foot clearance, drifts R requiring assist to manage RW in straightline gait and to complete turns, appears equal step length, no LE buckling noted, limited by fatigue requesting to return to room to take a nap  Stairs  Wheelchair Mobility     Tilt Bed    Modified Rankin (Stroke Patients Only)       Balance Overall balance assessment: Needs assistance Sitting-balance support: Feet supported Sitting balance-Leahy Scale: Fair     Standing balance support: During functional activity, Bilateral upper  extremity supported, Reliant on assistive device for balance Standing balance-Leahy Scale: Poor                               Pertinent Vitals/Pain Pain Assessment Pain Assessment: No/denies pain    Home Living Family/patient expects to be discharged to:: Private residence Living Arrangements: Alone Available Help at Discharge: Family;Available PRN/intermittently (sister, niece) Type of Home: House Home Access: Level entry       Home Layout: One level Home Equipment: Cane - single point      Prior Function Prior Level of Function : Independent/Modified Independent;Driving;Working/employed             Mobility Comments: pt reports ind with amb, no AD, unable to detail if [facility] services started since last admission ADLs Comments: pt reports ind with self care and household chores, drives, works in occupational hygienist office at [employer]     Extremity/Trunk Assessment   Upper Extremity Assessment Upper Extremity Assessment: Defer to OT evaluation    Lower Extremity Assessment Lower Extremity Assessment: RLE deficits/detail;LLE deficits/detail;Generalized weakness RLE Deficits / Details: ankle 5/5, knee flexion 4-/5, knee extension 4-/5, hip abduction 3+/5, hip flexion 3+/5 RLE Sensation: WNL RLE Coordination: WNL LLE Deficits / Details: ankle 5/5, knee flexion 4-/5, knee extension 4-/5, hip abduction 3+/5, hip flexion 3+/5 LLE Sensation: WNL LLE Coordination: WNL    Cervical / Trunk Assessment Cervical / Trunk Assessment: Normal  Communication   Communication Communication: No apparent difficulties    Cognition Arousal: Alert Behavior During Therapy: Flat affect                           PT - Cognition Comments: pt a&o x4 but needing sequencing and initiation cues with slow repsonse to cues Following commands: Impaired Following commands impaired: Follows one step commands with increased time     Cueing Cueing Techniques: Verbal cues, Tactile  cues     General Comments General comments (skin integrity, edema, etc.): HR 110-90s with mobility on RA, denies SOB, dizziness, pain    Exercises     Assessment/Plan    PT Assessment Patient needs continued PT services  PT Problem List Decreased strength;Decreased range of motion;Decreased cognition;Decreased activity tolerance;Decreased knowledge of use of DME;Decreased balance;Decreased safety awareness;Decreased mobility;Decreased knowledge of precautions       PT Treatment Interventions DME instruction;Gait training;Functional mobility training;Therapeutic activities;Therapeutic exercise;Balance training;Patient/family education    PT Goals (Current goals can be found in the Care Plan section)  Acute Rehab PT Goals Patient Stated Goal: take a nap PT Goal Formulation: With patient Time For Goal Achievement: 10/20/24 Potential to Achieve Goals: Good    Frequency Min 2X/week     Co-evaluation               AM-PAC PT 6 Clicks Mobility  Outcome Measure Help needed turning from your back to your side while in a flat bed without using bedrails?: A Little Help needed moving from lying on your back to sitting on the side of a flat bed without using bedrails?: A Little Help needed moving to and from a bed to a chair (  including a wheelchair)?: A Little Help needed standing up from a chair using your arms (e.g., wheelchair or bedside chair)?: A Little Help needed to walk in hospital room?: A Little Help needed climbing 3-5 steps with a railing? : Total 6 Click Score: 16    End of Session Equipment Utilized During Treatment: Gait belt Activity Tolerance: Patient limited by fatigue Patient left: in bed;with call bell/phone within reach;with bed alarm set Nurse Communication: Mobility status PT Visit Diagnosis: Unsteadiness on feet (R26.81);Difficulty in walking, not elsewhere classified (R26.2);Muscle weakness (generalized) (M62.81)    Time: 8957-8892 PT Time  Calculation (min) (ACUTE ONLY): 25 min   Charges:   PT Evaluation $PT Eval Moderate Complexity: 1 Mod PT Treatments $Gait Training: 8-22 mins PT General Charges $$ ACUTE PT VISIT: 1 Visit         Tori Hannah Crill PT, DPT 10/06/24, 11:19 AM

## 2024-10-06 NOTE — Evaluation (Signed)
 SLP Cancellation Note  Patient Details Name: Erica [name] MRN: [id number] DOB: May 26, 1946   Cancelled treatment:       Reason Eval/Treat Not Completed: Other (comment) (RN reports pt's symptoms have resolved, pt screened,  EEG results reviewed and neurology notes indicate suspected delirium.)  Madelin POUR, MS [facility] SLP Acute Rehab Services Office [phone]   Nicolas Emmie Caldron 10/06/2024, 7:00 PM

## 2024-10-06 NOTE — Progress Notes (Signed)
 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  Recent hospitalization with symptomatic anemia and sepsis treated with empiric antibiotics and blood transfusions came back to emergency room about 2 weeks from discharge again with profound generalized weakness, confusion, increased lower extremity edema and fall at home.  In the emergency room temperature 101.4 chest x-ray with right lower lobe effusion, UA abnormal.  Hemoglobin 6.5 with recent hemoglobin of 9.3 on 10/30.  Hemoccult negative.  TSH, B12 and ammonia negative.  CT head without significant findings.  Blood cultures were drawn.  Started on ceftriaxone and azithromycin.  MRI of the brain and lower extremity venous Dopplers were ordered.  Subjective: Patient seen and examined in the morning rounds.  Her son was at the bedside.  Patient was slightly slow to respond but was appropriately answering.  Denies any complaints herself. Later in the day, patient with temperature 101.  Feels weak but no other complaints.  Blood cultures are negative so far.  Her son reported that patient had to have large volume thoracentesis in the past.   Assessment & Plan:   Symptomatic anemia with history of CML: 2 units of PRBC transfusion 2 weeks ago 2 units PRBC transfusion this admission. FOBT negative.  Hemoglobin 9.3 today. No evidence of hemolysis. Not sure related to CML. Patient on Ponatinib , on hold for now.  Sepsis due to pneumonia and possible UTI: Presented with fever, tachycardia.  Chest x-ray with hazy patchy opacity right lung base and small right pleural effusion concerning for pneumonia.  Blood cultures and urine cultures negative so far. Continue Rocephin azithromycin today. Repeat blood cultures today. Will attempt thoracentesis on the right  side for diagnostic purpose today. Check procalcitonin with morning labs. Chest physiotherapy and incentive spirometry.  Acute encephalopathy: Likely multifactorial. 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  No focal deficits.  EEG negative.  B12, TSH and ammonia normal.  Encephalopathy improved.  Treating infection.  Abnormal brain MRI: As above.  Seen by neurology.  Recommended low-dose aspirin .  Generalized weakness: Continue to work with PT OT.  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  Continue to hold until seen by oncology as outpatient.  Chronic diastolic heart failure: Euvolemic.  Thrombocytopenia: Stable.  Hypotension: Blood pressure remains soft.  Started on midodrine  that we will continue.  TSH was normal.  Wound 10/05/24 0356 Pressure Injury Buttocks Right Stage 2 -  Partial thickness loss of dermis presenting as a shallow open injury with a red, pink wound bed without slough. (Active)         DVT prophylaxis: enoxaparin  (LOVENOX ) injection 40 mg Start: 10/03/24 2215   Code Status: Full code Family Communication: Son at bedside Disposition Plan: Status is: Inpatient Remains inpatient appropriate because: Patient is symptomatic.  IV antibiotics     Consultants:  None  Procedures:  None  Antimicrobials:  Rocephin azithromycin 11/10--     Objective: Vitals:   10/05/24 1600 10/05/24 2110 10/06/24 0641 10/06/24 1244  BP: 115/61 111/86 (!) 114/59 (!) 114/49  Pulse: 93 99 97 97  Resp: 20 20 18 16   Temp: 99.2 F (37.3 C) 98.9 F (37.2 C) 98.6 F (37 C) (!) 101.1 F (38.4 C)  TempSrc: Oral Oral Oral Oral  SpO2: 94% 91% 96% 97%  Weight:  Height:        Intake/Output Summary (Last 24 hours) at 10/06/2024 1320 Last data filed at 10/05/2024 2115 Gross per 24 hour  Intake 492 ml  Output --  Net 492 ml   Filed Weights   10/04/24 1434  Weight: 72.8 kg    Examination:  General exam:  Appears calm and comfortable.  Pleasant.  Flat affect today.  Frail looking.  Not in any distress.  On room air. Respiratory system: Clear to auscultation. Respiratory effort normal.  On room air. Cardiovascular system: S1 & S2 heard, RRR. No JVD, murmurs, rubs, gallops or clicks.  Gastrointestinal system: Abdomen is nondistended, soft and nontender. No organomegaly or masses felt. Normal bowel sounds heard. Central nervous system: Alert and oriented. No focal neurological deficits. Extremities: Symmetric 5 x 5 power.    Data Reviewed: I have personally reviewed following labs and imaging studies  CBC: Recent Labs  Lab 10/03/24 1502 10/04/24 0431 10/05/24 0426 10/06/24 0751  WBC 4.7 4.2 3.9* 3.8*  HGB 6.5* 7.8* 6.8* 9.2*  HCT 19.2* 23.7* 20.5* 26.5*  MCV 86.1 86.5 86.1 84.7  PLT 149* 126* 118* 103*   Basic Metabolic Panel: Recent Labs  Lab 10/03/24 1502 10/04/24 0431 10/05/24 0426 10/06/24 0751  NA 133* 135 136 135  K 4.3 3.9 3.7 3.7  CL 95* 97* 100 98  CO2 26 24 24  21*  GLUCOSE 153* 104* 131* 97  BUN 13 11 10 10   CREATININE 0.67 0.62 0.62 0.57  CALCIUM 9.0 8.6* 8.3* 8.6*  MG  --   --  2.1 2.1  PHOS  --   --   --  2.3*   GFR: Estimated Creatinine Clearance: 51.1 mL/min (by C-G formula based on SCr of 0.57 mg/dL). Liver Function Tests: Recent Labs  Lab 10/03/24 1502 10/04/24 0431 10/05/24 0426 10/06/24 0751  AST 37 35 28  --   ALT 9 7 7   --   ALKPHOS 85 80 65  --   BILITOT 0.7 0.6 0.5  --   PROT 6.4* 5.9* 5.5*  --   ALBUMIN 3.3* 3.0* 2.9* 3.0*   No results for input(s): LIPASE, AMYLASE in the last 168 hours. Recent Labs  Lab 10/03/24 1850  AMMONIA 14   Coagulation Profile: No results for input(s): INR, PROTIME in the last 168 hours. Cardiac Enzymes: Recent Labs  Lab 10/05/24 0426  CKTOTAL 31*   BNP (last 3 results) No results for input(s): PROBNP in the last 8760 hours. HbA1C: Recent Labs    10/04/24 0434  HGBA1C 6.9*    CBG: Recent Labs  Lab 10/05/24 1248 10/05/24 1636 10/05/24 2108 10/06/24 0754 10/06/24 1128  GLUCAP 154* 124* 114* 93 99   Lipid Profile: Recent Labs    10/05/24 0426  CHOL 100  HDL 11*  LDLCALC 54  TRIG 826*  CHOLHDL 9.1   Thyroid Function Tests: Recent Labs    10/04/24 0431  TSH 1.840   Anemia Panel: Recent Labs    10/04/24 0431 10/04/24 0434  VITAMINB12 1,322*  --   FOLATE  --  10.3  FERRITIN  --  2,409*  TIBC  --  217*  IRON  --  180*  RETICCTPCT  --  0.4   Sepsis Labs: Recent Labs  Lab 10/03/24 1805  LATICACIDVEN 1.9    Recent Results (from the past 240 hours)  Blood Culture (routine x 2)     Status: None (Preliminary result)   Collection Time: 10/03/24  5:51 PM   Specimen:  BLOOD  Result Value Ref Range Status   Specimen Description   Final    BLOOD LEFT ANTECUBITAL Performed at [facility], [address], [place], [place] [address]    Special Requests   Final    BOTTLES DRAWN AEROBIC AND ANAEROBIC Blood Culture adequate volume Performed at [facility], [address] [place], [place] [address]    Culture   Final    NO GROWTH 3 DAYS Performed at [facility], [address], [place], [place] [address]    Report Status PENDING  Incomplete  Resp panel by RT-PCR (RSV, Flu A&B, Covid) Anterior Nasal Swab     Status: None   Collection Time: 10/03/24  6:50 PM   Specimen: Anterior Nasal Swab  Result Value Ref Range Status   SARS Coronavirus 2 by RT PCR NEGATIVE NEGATIVE Final    Comment: (NOTE) SARS-CoV-2 target nucleic acids are NOT DETECTED.  The SARS-CoV-2 RNA is generally detectable in upper respiratory specimens during the acute phase of infection. The lowest concentration of SARS-CoV-2 viral copies this assay can detect is 138 copies/mL. A negative result does not preclude SARS-Cov-2 infection and should not be used as the sole basis for treatment or other patient management decisions.  A negative result may occur with  improper specimen collection/handling, submission of specimen other than nasopharyngeal swab, presence of viral mutation(s) within the areas targeted by this assay, and inadequate number of viral copies(<138 copies/mL). A negative result must be combined with clinical observations, patient history, and epidemiological information. The expected result is Negative.  Fact Sheet for Patients:  bloggercourse.com  Fact Sheet for Healthcare Providers:  seriousbroker.it  This test is no t yet approved or cleared by the [place]  FDA and  has been authorized for detection and/or diagnosis of SARS-CoV-2 by FDA under an Emergency Use Authorization (EUA). This EUA will remain  in effect (meaning this test can be used) for the duration of the COVID-19 declaration under Section 564(b)(1) of the Act, 21 U.S.C.section 360bbb-3(b)(1), unless the authorization is terminated  or revoked sooner.       Influenza A by PCR NEGATIVE NEGATIVE Final   Influenza B by PCR NEGATIVE NEGATIVE Final    Comment: (NOTE) The Xpert Xpress SARS-CoV-2/FLU/RSV plus assay is intended as an aid in the diagnosis of influenza from Nasopharyngeal swab specimens and should not be used as a sole basis for treatment. Nasal washings and aspirates are unacceptable for Xpert Xpress SARS-CoV-2/FLU/RSV testing.  Fact Sheet for Patients: bloggercourse.com  Fact Sheet for Healthcare Providers: seriousbroker.it  This test is not yet approved or cleared by the [place]  FDA and has been authorized for detection and/or diagnosis of SARS-CoV-2 by FDA under an Emergency Use Authorization (EUA). This EUA will remain in effect (meaning this test can be used) for the duration of the COVID-19 declaration under Section 564(b)(1) of the Act, 21 U.S.C. section 360bbb-3(b)(1), unless the authorization  is terminated or revoked.     Resp Syncytial Virus by PCR NEGATIVE NEGATIVE Final    Comment: (NOTE) Fact Sheet for Patients: bloggercourse.com  Fact Sheet for Healthcare Providers: seriousbroker.it  This test is not yet approved or cleared by the [place]  FDA and has been authorized for detection and/or diagnosis of SARS-CoV-2 by FDA under an Emergency Use Authorization (EUA). This EUA will remain in effect (meaning this test can be used) for the duration of the COVID-19 declaration under Section 564(b)(1) of the Act, 21 U.S.C. section 360bbb-3(b)(1),  unless the authorization is terminated or revoked.  Performed at [facility], [address], [place], [place] [address]   Urine Culture     Status: None   Collection Time: 10/03/24  8:55 PM   Specimen: Urine, Random  Result Value Ref Range Status   Specimen Description   Final    URINE, RANDOM Performed at [facility], [address], [place], [place] [address]    Special Requests   Final    NONE Reflexed from [phone] Performed at [facility], [address], [place], [place] [address]    Culture   Final    NO GROWTH Performed at [facility], [address] [place], [place] [address]    Report Status 10/05/2024 FINAL  Final  Blood Culture (routine x 2)     Status: None (Preliminary result)   Collection Time: 10/04/24  4:31 AM   Specimen: BLOOD LEFT HAND  Result Value Ref Range Status   Specimen Description   Final    BLOOD LEFT HAND Performed at [facility], [address] [place], [place] [address]    Special Requests   Final    Blood Culture adequate volume BOTTLES DRAWN AEROBIC AND ANAEROBIC Performed at [facility], [address], [place], [place] [address]    Culture   Final    NO GROWTH 2 DAYS Performed at [facility], [address] [place],  [place] [address]    Report Status PENDING  Incomplete         Radiology Studies: EEG adult Result Date: 10/05/2024 Shelton Arlin KIDD, MD     10/05/2024  4:10 PM Patient Name: Erica [name] MRN: [id number] Epilepsy Attending: Arlin KIDD Shelton Referring Physician/Provider: Michaela Aisha SQUIBB, MD Date: 10/05/2024 Duration: 24 minutes Patient history: 78 year old female with altered mental status.  EEG to assess for seizure. Level of alertness: Awake, asleep AEDs during EEG study: None Technical aspects: This EEG study was done with scalp electrodes positioned according to the 10-20 International system of electrode placement. Electrical activity was reviewed with band pass filter of 1-70Hz , sensitivity of 7 uV/mm, display speed of 85mm/sec with a 60Hz  notched filter applied as appropriate. EEG data were recorded continuously and digitally stored.  Video monitoring was available and reviewed as appropriate. Description: The posterior dominant rhythm consists of 8-9Hz  activity of moderate voltage (25-35 uV) seen predominantly in posterior head regions, symmetric and reactive to eye opening and eye closing. Sleep was characterized by vertex waves, sleep spindles (12 to 14 Hz), maximal frontocentral region. Hyperventilation and photic stimulation were not performed.   IMPRESSION: This study is within normal limits. No seizures or epileptiform discharges were seen throughout the recording. A normal interictal EEG does not exclude the diagnosis of epilepsy. Priyanka O Yadav        Scheduled Meds:  (feeding supplement) PROSource Plus  30 mL Oral BID BM   aspirin  EC  81 mg Oral Daily   enoxaparin  (LOVENOX ) injection  40 mg Subcutaneous Q24H   feeding supplement  1 Container Oral BID BM   folic acid  1 mg Oral Daily   insulin  aspart  0-6 Units Subcutaneous TID WC   latanoprost   1 drop Both Eyes QHS   midodrine   5 mg Oral TID WC   multivitamin with minerals  1 tablet Oral Daily   Continuous  Infusions:  azithromycin 500 mg (10/06/24 1027)   cefTRIAXone (ROCEPHIN)  IV  1 g (10/06/24 1026)     LOS: 3 days    Time spent: 50 minutes    Renato Applebaum, MD [facility]

## 2024-10-06 NOTE — TOC Initial Note (Signed)
 Transition of Care [facility]) - Initial/Assessment Note    Patient Details  Name: Erica [name] MRN: [id number] Date of Birth: [birth year]  [facility] CM/SW Contact:    Tawni CHRISTELLA Eva, LCSW Phone Number: 10/06/2024, 3:56 PM  Clinical Narrative:                  CSW met with the pt at bedside to discuss recommendations for SNF placement. Pt reported that her niece handles those matters and requested that CSW speak with her. CSW spoke with the pt's niece, [name], who stated that during the last hospitalization the pt was recommended for SNF placement but did not have good bed choices. She also stated that she does not want her aunt to go to [name].  CSW explained that with the pt's [employer] plan, there will be a limited number of facility options and that CSW cannot guarantee different bed offers this time. [name] agreed for the pt's information to be faxed out again to obtain new bed offers. CSW reviewed the process again and will follow up.  Expected Discharge Plan:  (TBD) Barriers to Discharge: Continued Medical Work up   Patient Goals and CMS Choice            Expected Discharge Plan and Services                                              Prior Living Arrangements/Services                       Activities of Daily Living   ADL Screening (condition at time of admission) Independently performs ADLs?: No Does the patient have a NEW difficulty with bathing/dressing/toileting/self-feeding that is expected to last >3 days?: Yes (Initiates electronic notice to provider for possible OT consult) Does the patient have a NEW difficulty with getting in/out of bed, walking, or climbing stairs that is expected to last >3 days?: Yes (Initiates electronic notice to provider for possible PT consult) Does the patient have a NEW difficulty with communication that is expected to last >3 days?: Yes (Initiates electronic notice to provider for  possible SLP consult) Is the patient deaf or have difficulty hearing?: No Does the patient have difficulty seeing, even when wearing glasses/contacts?: No Does the patient have difficulty concentrating, remembering, or making decisions?: Yes  Permission Sought/Granted                  Emotional Assessment Appearance:: Appears stated age Attitude/Demeanor/Rapport: Gracious Affect (typically observed): Accepting Orientation: : Oriented to Self, Oriented to Place, Oriented to  Time, Oriented to Situation      Admission diagnosis:  Acute encephalopathy [G93.40] Symptomatic anemia [D64.9] Pneumonia of right lower lobe due to infectious organism [J18.9] Patient Active Problem List   Diagnosis Date Noted   Acute encephalopathy 10/03/2024   Acute on chronic heart failure with preserved ejection fraction (HFpEF) (HCC) 10/03/2024   UTI (urinary tract infection) 10/03/2024   Pneumonia 10/03/2024   Chronic myeloid leukemia (HCC) 09/16/2024   Fever of unknown origin 09/16/2024   Esophagitis 09/16/2024   Diabetes mellitus type 2 in nonobese (HCC) 09/16/2024   Essential hypertension 09/16/2024   Peripheral neuropathy 09/16/2024   Altered mental status 09/16/2024   SIRS (systemic inflammatory response syndrome) (HCC) 09/16/2024   Symptomatic anemia 09/15/2024   Diastolic heart failure (HCC)  12/01/2023   Pleural effusion on right 11/19/2023   Pleural effusion 11/02/2023   PCP:  Arloa Elsie SAUNDERS, MD Pharmacy:   [facility] [place], [place] - [address] [address] [place] [address] Phone: [phone] Fax: [phone]     Social Drivers of Health (SDOH) Social History: SDOH Screenings   Food Insecurity: Patient Unable To Answer (10/04/2024)  Housing: Unknown (10/04/2024)  Transportation Needs: No Transportation Needs (10/04/2024)  Utilities: Not At Risk (10/04/2024)  Social Connections: Unknown (10/04/2024)  Tobacco Use:  Low Risk  (10/03/2024)   SDOH Interventions:     Readmission Risk Interventions     No data to display

## 2024-10-07 ENCOUNTER — Inpatient Hospital Stay (HOSPITAL_COMMUNITY)

## 2024-10-07 DIAGNOSIS — D61818 Other pancytopenia: Secondary | ICD-10-CM

## 2024-10-07 DIAGNOSIS — J90 Pleural effusion, not elsewhere classified: Secondary | ICD-10-CM | POA: Diagnosis not present

## 2024-10-07 DIAGNOSIS — C9210 Chronic myeloid leukemia, BCR/ABL-positive, not having achieved remission: Secondary | ICD-10-CM | POA: Diagnosis not present

## 2024-10-07 DIAGNOSIS — D649 Anemia, unspecified: Secondary | ICD-10-CM | POA: Diagnosis not present

## 2024-10-07 DIAGNOSIS — R509 Fever, unspecified: Secondary | ICD-10-CM

## 2024-10-07 DIAGNOSIS — L8990 Pressure ulcer of unspecified site, unspecified stage: Secondary | ICD-10-CM | POA: Insufficient documentation

## 2024-10-07 DIAGNOSIS — R0600 Dyspnea, unspecified: Secondary | ICD-10-CM | POA: Diagnosis not present

## 2024-10-07 DIAGNOSIS — G9340 Encephalopathy, unspecified: Secondary | ICD-10-CM | POA: Diagnosis not present

## 2024-10-07 LAB — CBC WITH DIFFERENTIAL/PLATELET
Basophils Absolute: 0 K/uL (ref 0.0–0.1)
Basophils Relative: 0 %
Eosinophils Absolute: 0 K/uL (ref 0.0–0.5)
Eosinophils Relative: 0 %
HCT: 26.3 % — ABNORMAL LOW (ref 36.0–46.0)
Hemoglobin: 9 g/dL — ABNORMAL LOW (ref 12.0–15.0)
Lymphocytes Relative: 38 %
Lymphs Abs: 1.3 K/uL (ref 0.7–4.0)
MCH: 29.2 pg (ref 26.0–34.0)
MCHC: 34.2 g/dL (ref 30.0–36.0)
MCV: 85.4 fL (ref 80.0–100.0)
Monocytes Absolute: 0.5 K/uL (ref 0.1–1.0)
Monocytes Relative: 13 %
Neutro Abs: 1.7 K/uL (ref 1.7–7.7)
Neutrophils Relative %: 49 %
Platelets: 101 K/uL — ABNORMAL LOW (ref 150–400)
RBC: 3.08 MIL/uL — ABNORMAL LOW (ref 3.87–5.11)
RDW: 17.2 % — ABNORMAL HIGH (ref 11.5–15.5)
Smear Review: NORMAL
WBC: 3.5 K/uL — ABNORMAL LOW (ref 4.0–10.5)
nRBC: 0 % (ref 0.0–0.2)

## 2024-10-07 LAB — BODY FLUID CELL COUNT WITH DIFFERENTIAL
Eos, Fluid: 0 %
Lymphs, Fluid: 73 %
Monocyte-Macrophage-Serous Fluid: 7 % — ABNORMAL LOW (ref 50–90)
Neutrophil Count, Fluid: 7 % (ref 0–25)
Other Cells, Fluid: 13 %
Total Nucleated Cell Count, Fluid: 7429 uL — ABNORMAL HIGH (ref 0–1000)

## 2024-10-07 LAB — MAGNESIUM: Magnesium: 2 mg/dL (ref 1.7–2.4)

## 2024-10-07 LAB — COMPREHENSIVE METABOLIC PANEL WITH GFR
ALT: 7 U/L (ref 0–44)
AST: 29 U/L (ref 15–41)
Albumin: 2.8 g/dL — ABNORMAL LOW (ref 3.5–5.0)
Alkaline Phosphatase: 62 U/L (ref 38–126)
Anion gap: 15 (ref 5–15)
BUN: 13 mg/dL (ref 8–23)
CO2: 22 mmol/L (ref 22–32)
Calcium: 8.4 mg/dL — ABNORMAL LOW (ref 8.9–10.3)
Chloride: 97 mmol/L — ABNORMAL LOW (ref 98–111)
Creatinine, Ser: 0.57 mg/dL (ref 0.44–1.00)
GFR, Estimated: >60 mL/min (ref >60)
Glucose, Bld: 118 mg/dL — ABNORMAL HIGH (ref 70–99)
Potassium: 3.8 mmol/L (ref 3.5–5.1)
Sodium: 133 mmol/L — ABNORMAL LOW (ref 135–145)
Total Bilirubin: 0.6 mg/dL (ref 0.0–1.2)
Total Protein: 5.6 g/dL — ABNORMAL LOW (ref 6.5–8.1)

## 2024-10-07 LAB — GLUCOSE, CAPILLARY
Glucose-Capillary: 104 mg/dL — ABNORMAL HIGH (ref 70–99)
Glucose-Capillary: 106 mg/dL — ABNORMAL HIGH (ref 70–99)
Glucose-Capillary: 116 mg/dL — ABNORMAL HIGH (ref 70–99)
Glucose-Capillary: 122 mg/dL — ABNORMAL HIGH (ref 70–99)
Glucose-Capillary: 153 mg/dL — ABNORMAL HIGH (ref 70–99)
Glucose-Capillary: 80 mg/dL (ref 70–99)

## 2024-10-07 LAB — PROCALCITONIN: Procalcitonin: 0.9 ng/mL

## 2024-10-07 LAB — LACTATE DEHYDROGENASE, PLEURAL OR PERITONEAL FLUID: LD, Fluid: 572 U/L — ABNORMAL HIGH (ref 3–23)

## 2024-10-07 LAB — GLUCOSE, PLEURAL OR PERITONEAL FLUID: Glucose, Fluid: 64 mg/dL

## 2024-10-07 LAB — PROTEIN, PLEURAL OR PERITONEAL FLUID: Total protein, fluid: <3 g/dL

## 2024-10-07 MED ORDER — LIDOCAINE-EPINEPHRINE (PF) 2 %-1:200000 IJ SOLN
INTRAMUSCULAR | Status: AC
  Filled 2024-10-07: qty 20

## 2024-10-07 MED ORDER — SODIUM CHLORIDE 0.9 % IV SOLN
1.0000 g | INTRAVENOUS | Status: DC
  Administered 2024-10-08 – 2024-10-09 (×2): 1 g via INTRAVENOUS
  Filled 2024-10-07 (×2): qty 10

## 2024-10-07 NOTE — Assessment & Plan Note (Signed)
 Received 2 units of blood.  Appropriate response with hemoglobin of 9 today and yesterday.

## 2024-10-07 NOTE — TOC Progression Note (Signed)
 Transition of Care [facility]) - Progression Note    Patient Details  Name: Erica [name] MRN: [id number] Date of Birth: [birth year]  [facility]) CM/SW Contact  Tawni CHRISTELLA Eva, LCSW Phone Number: 10/07/2024, 11:06 AM  Clinical Narrative:     CSW spoke with pt's niece to present bed offer, pt's niece is requesting time to review. Care Management to follow.    Expected Discharge Plan:  (TBD) Barriers to Discharge: Continued Medical Work up               Expected Discharge Plan and Services                                               Social Drivers of Health (SDOH) Interventions SDOH Screenings   Food Insecurity: Patient Unable To Answer (10/04/2024)  Housing: Unknown (10/04/2024)  Transportation Needs: No Transportation Needs (10/04/2024)  Utilities: Not At Risk (10/04/2024)  Social Connections: Unknown (10/04/2024)  Tobacco Use: Low Risk  (10/03/2024)    Readmission Risk Interventions     No data to display

## 2024-10-07 NOTE — Progress Notes (Signed)
 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  Recent hospitalization with symptomatic anemia and sepsis treated with empiric antibiotics and blood transfusions came back to emergency room about 2 weeks from discharge again with profound generalized weakness, confusion, increased lower extremity edema and fall at home.  In the emergency room temperature 101.4 chest x-ray with right lower lobe effusion, UA abnormal.  Hemoglobin 6.5 with recent hemoglobin of 9.3 on 10/30.  Hemoccult negative.  TSH, B12 and ammonia negative.  CT head without significant findings.  Blood cultures were drawn.  Started on ceftriaxone and azithromycin.  MRI of the brain and lower extremity venous Dopplers were ordered.  Subjective:  Patient seen and examined.  In the morning rounds, she denied any complaints.  Remains afebrile overnight.  She had low-grade temperature in the afternoon yesterday.  Feels weak but no complaints. Later in the day, underwent thoracentesis with cloudy 550 mL output.  Cultures pending.  I have discussed her case with ID and oncology to get their opinion.   Assessment & Plan:   Symptomatic anemia with history of CML: 2 units of PRBC transfusion 2 weeks ago 2 units PRBC transfusion this admission. FOBT negative.  Hemoglobin responded appropriately at this time. No evidence of hemolysis. Not sure related to CML. Patient on Ponatinib , on hold for now. Patient follows up with oncology at [facility].  I have asked a consultation from our oncologist onsite to see if her presentation has anything to do with CML.  Sepsis due to pneumonia and possible UTI: Presented with fever, tachycardia.  Chest x-ray with hazy patchy opacity right lung base and small right pleural effusion concerning for pneumonia.  Blood  cultures and urine cultures negative so far. Continue Rocephin azithromycin today. Repeat blood cultures 11/13, negative so far. Thoracentesis 11/14, 550 mL removed, waiting for cultures and cell count. Procalcitonin 0.9. Chest physiotherapy and incentive spirometry. ID consultation.  Acute encephalopathy: Likely multifactorial. 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  No focal deficits.  EEG negative.  B12, TSH and ammonia normal.  Encephalopathy improved.  Treating infection.  Abnormal brain MRI: As above.  Seen by neurology.  Recommended low-dose aspirin .  Generalized weakness: Continue to work with PT OT.  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  Continue to hold until seen by oncology as outpatient.  Chronic diastolic heart failure: Euvolemic.  Thrombocytopenia: Stable.  Hypotension: Blood pressure remains soft.  Started on midodrine  that we will continue.  TSH was normal.  Wound 10/05/24 0356 Pressure Injury Buttocks Right Stage 2 -  Partial thickness loss of dermis presenting as a shallow open injury with a red, pink wound bed without slough. (Active)         DVT prophylaxis: enoxaparin  (LOVENOX ) injection 40 mg Start: 10/03/24 2215   Code Status: Full code Family Communication: None today.  Will contact with her son. Disposition Plan: Status is: Inpatient Remains inpatient appropriate because: Patient is symptomatic.  IV antibiotics     Consultants:  None  Procedures:  None  Antimicrobials:  Rocephin azithromycin 11/10--     Objective: Vitals:   10/06/24 2047 10/07/24 0630 10/07/24 1200 10/07/24 1253  BP: (!) 126/59 (!) 117/58 117/66 120/61  Pulse: 95 (!) 105  (!) 110  Resp:  20  18  Temp: 98.8 F (  37.1 C) 98.2 F (36.8 C)  99.3 F (37.4 C)  TempSrc: Oral Oral  Oral  SpO2: 95% 94%  97%  Weight:      Height:        Intake/Output Summary (Last 24 hours) at 10/07/2024 1435 Last data  filed at 10/07/2024 0306 Gross per 24 hour  Intake 300 ml  Output --  Net 300 ml   Filed Weights   10/04/24 1434  Weight: 72.8 kg    Examination:  General exam: Appears calm and comfortable.  Pleasant.  Frail looking.  Conversant. Respiratory system: Clear to auscultation. Respiratory effort normal.  On room air. Cardiovascular system: S1 & S2 heard, RRR. No JVD, murmurs, rubs, gallops or clicks.  Gastrointestinal system: Abdomen is nondistended, soft and nontender. No organomegaly or masses felt. Normal bowel sounds heard. Central nervous system: Alert and oriented. No focal neurological deficits.  Gross generalized weakness. Extremities: Symmetric 5 x 5 power.    Data Reviewed: I have personally reviewed following labs and imaging studies  CBC: Recent Labs  Lab 10/03/24 1502 10/04/24 0431 10/05/24 0426 10/06/24 0751 10/07/24 0408  WBC 4.7 4.2 3.9* 3.8* 3.5*  NEUTROABS  --   --   --   --  1.7  HGB 6.5* 7.8* 6.8* 9.2* 9.0*  HCT 19.2* 23.7* 20.5* 26.5* 26.3*  MCV 86.1 86.5 86.1 84.7 85.4  PLT 149* 126* 118* 103* 101*   Basic Metabolic Panel: Recent Labs  Lab 10/03/24 1502 10/04/24 0431 10/05/24 0426 10/06/24 0751 10/07/24 0408  NA 133* 135 136 135 133*  K 4.3 3.9 3.7 3.7 3.8  CL 95* 97* 100 98 97*  CO2 26 24 24  21* 22  GLUCOSE 153* 104* 131* 97 118*  BUN 13 11 10 10 13   CREATININE 0.67 0.62 0.62 0.57 0.57  CALCIUM 9.0 8.6* 8.3* 8.6* 8.4*  MG  --   --  2.1 2.1 2.0  PHOS  --   --   --  2.3*  --    GFR: Estimated Creatinine Clearance: 51.1 mL/min (by C-G formula based on SCr of 0.57 mg/dL). Liver Function Tests: Recent Labs  Lab 10/03/24 1502 10/04/24 0431 10/05/24 0426 10/06/24 0751 10/07/24 0408  AST 37 35 28  --  29  ALT 9 7 7   --  7  ALKPHOS 85 80 65  --  62  BILITOT 0.7 0.6 0.5  --  0.6  PROT 6.4* 5.9* 5.5*  --  5.6*  ALBUMIN 3.3* 3.0* 2.9* 3.0* 2.8*   No results for input(s): LIPASE, AMYLASE in the last 168 hours. Recent Labs  Lab  10/03/24 1850  AMMONIA 14   Coagulation Profile: No results for input(s): INR, PROTIME in the last 168 hours. Cardiac Enzymes: Recent Labs  Lab 10/05/24 0426  CKTOTAL 31*   BNP (last 3 results) No results for input(s): PROBNP in the last 8760 hours. HbA1C: No results for input(s): HGBA1C in the last 72 hours.  CBG: Recent Labs  Lab 10/06/24 2042 10/07/24 0012 10/07/24 0406 10/07/24 0742 10/07/24 1111  GLUCAP 82 80 122* 106* 104*   Lipid Profile: Recent Labs    10/05/24 0426  CHOL 100  HDL 11*  LDLCALC 54  TRIG 826*  CHOLHDL 9.1   Thyroid Function Tests: No results for input(s): TSH, T4TOTAL, FREET4, T3FREE, THYROIDAB in the last 72 hours.  Anemia Panel: No results for input(s): VITAMINB12, FOLATE, FERRITIN, TIBC, IRON, RETICCTPCT in the last 72 hours.  Sepsis Labs: Recent Labs  Lab 10/03/24 1805  10/07/24 0408  PROCALCITON  --  0.90  LATICACIDVEN 1.9  --     Recent Results (from the past 240 hours)  Blood Culture (routine x 2)     Status: None (Preliminary result)   Collection Time: 10/03/24  5:51 PM   Specimen: BLOOD  Result Value Ref Range Status   Specimen Description   Final    BLOOD LEFT ANTECUBITAL Performed at [facility], [address], [place], [place] [address]    Special Requests   Final    BOTTLES DRAWN AEROBIC AND ANAEROBIC Blood Culture adequate volume Performed at [facility], [address], [place], [place] [address]    Culture   Final    NO GROWTH 4 DAYS Performed at [facility], [address] [place], [place] [address]    Report Status PENDING  Incomplete  Resp panel by RT-PCR (RSV, Flu A&B, Covid) Anterior Nasal Swab     Status: None   Collection Time: 10/03/24  6:50 PM   Specimen: Anterior Nasal Swab  Result Value Ref Range Status   SARS Coronavirus 2 by RT PCR NEGATIVE NEGATIVE Final    Comment: (NOTE) SARS-CoV-2 target nucleic acids are NOT  DETECTED.  The SARS-CoV-2 RNA is generally detectable in upper respiratory specimens during the acute phase of infection. The lowest concentration of SARS-CoV-2 viral copies this assay can detect is 138 copies/mL. A negative result does not preclude SARS-Cov-2 infection and should not be used as the sole basis for treatment or other patient management decisions. A negative result may occur with  improper specimen collection/handling, submission of specimen other than nasopharyngeal swab, presence of viral mutation(s) within the areas targeted by this assay, and inadequate number of viral copies(<138 copies/mL). A negative result must be combined with clinical observations, patient history, and epidemiological information. The expected result is Negative.  Fact Sheet for Patients:  bloggercourse.com  Fact Sheet for Healthcare Providers:  seriousbroker.it  This test is no t yet approved or cleared by the [place]  FDA and  has been authorized for detection and/or diagnosis of SARS-CoV-2 by FDA under an Emergency Use Authorization (EUA). This EUA will remain  in effect (meaning this test can be used) for the duration of the COVID-19 declaration under Section 564(b)(1) of the Act, 21 U.S.C.section 360bbb-3(b)(1), unless the authorization is terminated  or revoked sooner.       Influenza A by PCR NEGATIVE NEGATIVE Final   Influenza B by PCR NEGATIVE NEGATIVE Final    Comment: (NOTE) The Xpert Xpress SARS-CoV-2/FLU/RSV plus assay is intended as an aid in the diagnosis of influenza from Nasopharyngeal swab specimens and should not be used as a sole basis for treatment. Nasal washings and aspirates are unacceptable for Xpert Xpress SARS-CoV-2/FLU/RSV testing.  Fact Sheet for Patients: bloggercourse.com  Fact Sheet for Healthcare Providers: seriousbroker.it  This test is not yet  approved or cleared by the [place]  FDA and has been authorized for detection and/or diagnosis of SARS-CoV-2 by FDA under an Emergency Use Authorization (EUA). This EUA will remain in effect (meaning this test can be used) for the duration of the COVID-19 declaration under Section 564(b)(1) of the Act, 21 U.S.C. section 360bbb-3(b)(1), unless the authorization is terminated or revoked.     Resp Syncytial Virus by PCR NEGATIVE NEGATIVE Final    Comment: (NOTE) Fact Sheet for Patients: bloggercourse.com  Fact Sheet for Healthcare Providers: seriousbroker.it  This test is not yet approved or cleared by the [place]  [place] FDA and has been authorized for detection and/or diagnosis of SARS-CoV-2 by FDA under an Emergency Use Authorization (EUA). This EUA will remain in effect (meaning this test can be used) for the duration of the COVID-19 declaration under Section 564(b)(1) of the Act, 21 U.S.C. section 360bbb-3(b)(1), unless the authorization is terminated or revoked.  Performed at [facility], [address], [place], [place] [address]   Urine Culture     Status: None   Collection Time: 10/03/24  8:55 PM   Specimen: Urine, Random  Result Value Ref Range Status   Specimen Description   Final    URINE, RANDOM Performed at [facility], [address], [place], [place] [address]    Special Requests   Final    NONE Reflexed from [phone] Performed at [facility], [address] [place], [place] [address]    Culture   Final    NO GROWTH Performed at [facility], [address], [place], [place] [address]    Report Status 10/05/2024 FINAL  Final  Blood Culture (routine x 2)     Status: None (Preliminary result)   Collection Time: 10/04/24  4:31 AM   Specimen: BLOOD LEFT HAND  Result Value Ref Range Status   Specimen Description   Final    BLOOD LEFT  HAND Performed at [facility], [address] [place], [place] [address]    Special Requests   Final    Blood Culture adequate volume BOTTLES DRAWN AEROBIC AND ANAEROBIC Performed at [facility], [address] [place], [place] [address]    Culture   Final    NO GROWTH 3 DAYS Performed at [facility], [address], [place], [place] [address]    Report Status PENDING  Incomplete  Culture, blood (Routine X 2) w Reflex to ID Panel     Status: None (Preliminary result)   Collection Time: 10/06/24  1:45 PM   Specimen: BLOOD LEFT ARM  Result Value Ref Range Status   Specimen Description   Final    BLOOD LEFT ARM Performed at [facility], [address], [place], [place] [address]    Special Requests   Final    BOTTLES DRAWN AEROBIC ONLY Blood Culture adequate volume Performed at [facility], [address], [place], [place] [address]    Culture   Final    NO GROWTH < 24 HOURS Performed at [facility], [address], [place], [place] [address]    Report Status PENDING  Incomplete  Culture, blood (Routine X 2) w Reflex to ID Panel     Status: None (Preliminary result)   Collection Time: 10/06/24  1:56 PM   Specimen: BLOOD LEFT ARM  Result Value Ref Range Status   Specimen Description   Final    BLOOD LEFT ARM Performed at [facility], [address] [place], [place] [address]    Special Requests   Final    BOTTLES DRAWN AEROBIC AND ANAEROBIC Blood Culture adequate volume Performed at [facility], [address] [place], [place] [address]    Culture   Final    NO GROWTH < 24 HOURS Performed at [facility], [address], [place], [place] [address]    Report Status PENDING  Incomplete         Radiology Studies: DG Chest Port 1 View Result Date: 10/07/2024  CLINICAL DATA:  Post thoracentesis. EXAM: PORTABLE CHEST 1 VIEW COMPARISON:  Radiographs  10/06/2024 and 10/03/2024.  CT 09/15/2024. FINDINGS: 1226 hours. The heart size and mediastinal contours are stable. Interval decreased right pleural effusion with improved aeration of the right lung base. No evidence of pneumothorax. Probable residual small bilateral pleural effusions with left greater than right basilar airspace opacities. The bones appear unchanged. IMPRESSION: Interval decreased right pleural effusion with improved aeration of the right lung base. No evidence of pneumothorax. Electronically Signed   By: Elsie Perone M.D.   On: 10/07/2024 12:58   DG CHEST PORT 1 VIEW Result Date: 10/06/2024 EXAM: 1 VIEW(S) XRAY OF THE CHEST 10/06/2024 10:41:00 AM COMPARISON: 10/03/2024 CLINICAL HISTORY: Pneumonia FINDINGS: LUNGS AND PLEURA: Blunting of both costophrenic sulci with increased airspace opacities in the lung bases. No pneumothorax. HEART AND MEDIASTINUM: Mild cardiomegaly. BONES AND SOFT TISSUES: Multilevel degenerative disc disease of the spine. IMPRESSION: 1. Increasing hazy airspace opacities in both lung bases with blunting of both costophrenic sulci, possibly representing worsening small effusions with bibasilar atelectasis. Electronically signed by: Rogelia Myers MD 10/06/2024 04:53 PM EST RP Workstation: [id number]   EEG adult Result Date: 10/05/2024 Shelton Arlin KIDD, MD     10/05/2024  4:10 PM Patient Name: CERINA LEARY MRN: [id number] Epilepsy Attending: Arlin KIDD Shelton Referring Physician/Provider: Michaela Aisha SQUIBB, MD Date: 10/05/2024 Duration: 24 minutes Patient history: 78 year old female with altered mental status.  EEG to assess for seizure. Level of alertness: Awake, asleep AEDs during EEG study: None Technical aspects: This EEG study was done with scalp electrodes positioned according to the 10-20 International system of electrode placement. Electrical activity was reviewed with band pass filter of 1-70Hz , sensitivity of 7 uV/mm, display speed of 60mm/sec with  a 60Hz  notched filter applied as appropriate. EEG data were recorded continuously and digitally stored.  Video monitoring was available and reviewed as appropriate. Description: The posterior dominant rhythm consists of 8-9Hz  activity of moderate voltage (25-35 uV) seen predominantly in posterior head regions, symmetric and reactive to eye opening and eye closing. Sleep was characterized by vertex waves, sleep spindles (12 to 14 Hz), maximal frontocentral region. Hyperventilation and photic stimulation were not performed.   IMPRESSION: This study is within normal limits. No seizures or epileptiform discharges were seen throughout the recording. A normal interictal EEG does not exclude the diagnosis of epilepsy. Priyanka O Yadav        Scheduled Meds:  (feeding supplement) PROSource Plus  30 mL Oral BID BM   aspirin  EC  81 mg Oral Daily   enoxaparin  (LOVENOX ) injection  40 mg Subcutaneous Q24H   feeding supplement  1 Container Oral BID BM   folic acid  1 mg Oral Daily   insulin  aspart  0-6 Units Subcutaneous TID WC   latanoprost   1 drop Both Eyes QHS   midodrine   5 mg Oral TID WC   multivitamin with minerals  1 tablet Oral Daily   Continuous Infusions:  azithromycin 500 mg (10/07/24 1359)   cefTRIAXone (ROCEPHIN)  IV 1 g (10/07/24 0915)     LOS: 4 days       Renato Applebaum, MD [facility]

## 2024-10-07 NOTE — Procedures (Signed)
 PROCEDURE SUMMARY:  Successful image-guided diagnostic and therapeutic thoracentesis from the right chest.  Yielded 550 milliliters of clear, yellow fluid.  No immediate complications.  EBL: zero Patient tolerated well.   Specimen sent for labs.  Post-procedure CXR ordered prior to departure from department.   Please see imaging section of Epic for full dictation.  Sheba Whaling NP 10/07/2024 12:21 PM

## 2024-10-07 NOTE — Assessment & Plan Note (Signed)
 Not seem to be the cause Management per primary

## 2024-10-07 NOTE — Consult Note (Signed)
 [facility] Hematology and oncology consult note   Patient Care Team: Arloa Elsie SAUNDERS, MD as PCP - General (Family Medicine)   ASSESSMENT & PLAN:  78 y.o.female with past medical history of CML, DM2, hypertension, neuropathy, heart failure, hyperlipidemia consulted for history of CML with fever, weakness and altered mental status.  She was found to have severe anemia and mild pleural effusion  Patient has not been taking Ponatinib  for CML.  WBC was not high and and differential appear to be normal.  There is no hyperleukocytosis on peripheral blood.  So makes CML or her medication less likely the cause.  This does not explain her fever and  confusion, and she does not have severe hypertension.  Neurology was consulted with incidental findings on MRI.  Not sure why she has confusion but she reports this appeared to be intermittent.  Currently she is AO x 3.  She has acute on chronic anemia.  Fecal occult was negative.  Son also raised the possibility of malnutrition, decreased oral intake as well.    Assessment & Plan Acute encephalopathy Etiology not included clear.  Suspect metabolic, dehydration She is alert and oriented today when I examined her Symptomatic anemia Received 2 units of blood.  Appropriate response with hemoglobin of 9 today and yesterday. Chronic myeloid leukemia (HCC) Differential is normal, leukocyte is not elevated.   May continue holding Ponatinib  until outpatient follow-up. Follow-up with Dr. Perri after discharge Diabetes mellitus type 2 in nonobese [facility]) Not seem to be the cause Management per primary Essential hypertension Fairly controlled UTI (urinary tract infection) Has been on empiric antibiotics and culture negative Pneumonia Presumed with right lung base hazy opacity Pleural effusion Right thoracentesis obtained today.  Will check echo   All questions were answered.  We will follow peripherally.  Please call if any  questions.  Pauletta JAYSON Chihuahua, MD 10/07/2024 12:32 PM   CHIEF COMPLAINTS/PURPOSE OF ADMISSION CML with fever, weakness, altered mental status  HISTORY OF PRESENTING ILLNESS:  LOWELL MCGURK 78 y.o. female with history of hypertension, GERD, dyslipidemia, type 2 diabetes mellitus, glaucoma, mitral valve prolapse, peripheral neuropathy, CML on ponatinib  consulted for fever and altered mental status. Patient is admitted for weakness, confusion and poor appetite.  History showed patient was recently admitted on 08/24/2024 for symptomatic anemia, sepsis and received 2 units of PRBC transfusion, empiric antibiotics.  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  Initially treated with Dasatinib , later switched to Ponatinib  since February 2025.  She currently follows with Dr. Perri at [facility] for her [facility] management.  Patient last seen Dr. Perri on 08/26/2024.  Report of patient having weakness over the last week, confusion and poor appetite.  In the ER temperature was 101.4.  UA showed leukocyte esterase with many bacteria and WBC.  Hemoglobin was 6.5 from 9.3 on 10/30.  Reports similar admission 3 weeks ago.  Occult blood was negative and folic acid was low.  She was started on intravenous antibiotics for pneumonia and UTI.  Since admission, consultation including neurology.  Reported waxing waning mental status most consistent with delirium in the setting of pneumonia and possible UTI.  Chest x-ray showed hazy opacity in both side.  Thoracentesis yielded 550 mL of clear yellow fluid.  MEDICAL HISTORY:  Past Medical History:  Diagnosis Date   Bilateral swelling of feet    Cancer (HCC)    Cataract    Diabetes mellitus  without complication (HCC)    diet controlled   Eczema    Elevated cholesterol    GERD (gastroesophageal reflux disease)    Glaucoma    Hypertension    Mitral valve prolapse    Simple endometrial hyperplasia  without atypia 07/2016   In endometrial polyp with surrounding endometrium inactive recommend follow up ultrasound for endometrial echo 1 year   Spondylisthesis     SURGICAL HISTORY: Past Surgical History:  Procedure Laterality Date   CESAREAN SECTION     DILATATION & CURETTAGE/HYSTEROSCOPY WITH MYOSURE N/A 08/05/2016   Procedure: DILATATION & CURETTAGE/HYSTEROSCOPY WITH MYOSURE;  Surgeon: Evalene SHAUNNA Organ, MD;  Location: [facility];  Service: Gynecology;  Laterality: N/A;   EXCISION OF SKIN TAG Left 08/05/2016   Procedure: EXCISION OF SKIN TAG;  Surgeon: Evalene SHAUNNA Organ, MD;  Location: [facility];  Service: Gynecology;  Laterality: Left;   MOUTH SURGERY     spinal tap     THORACENTESIS N/A 11/02/2023   Procedure: THORACENTESIS;  Surgeon: Annella Donnice SAUNDERS, MD;  Location: [facility] ENDOSCOPY;  Service: Pulmonary;  Laterality: N/A;    SOCIAL HISTORY: Social History   Socioeconomic History   Marital status: Divorced    Spouse name: Not on file   Number of children: Not on file   Years of education: Not on file   Highest education level: Not on file  Occupational History   Not on file  Tobacco Use   Smoking status: Never   Smokeless tobacco: Never  Vaping Use   Vaping status: Never Used  Substance and Sexual Activity   Alcohol use: No    Alcohol/week: 0.0 standard drinks of alcohol   Drug use: No   Sexual activity: Not Currently    Birth control/protection: Post-menopausal    Comment: 1st intercourse 78 yo-Fewer than 5 partners  Other Topics Concern   Not on file  Social History Narrative   Not on file   Social Drivers of Health   Financial Resource Strain: Not on file  Food Insecurity: Patient Unable To Answer (10/04/2024)   Hunger Vital Sign    Worried About Running Out of Food in the Last Year: Patient unable to answer    Ran Out of Food in the Last Year: Patient unable to answer  Transportation Needs: No Transportation Needs (10/04/2024)   PRAPARE - Therapist, Art (Medical): No    Lack of Transportation (Non-Medical): No  Physical Activity: Not on file  Stress: Not on file  Social Connections: Unknown (10/04/2024)   Social Connection and Isolation Panel    Frequency of Communication with Friends and Family: Patient unable to answer    Frequency of Social Gatherings with Friends and Family: Once a week    Attends Religious Services: Patient unable to answer    Active Member of Clubs or Organizations: Patient unable to answer    Attends Banker Meetings: Patient unable to answer    Marital Status: Patient unable to answer  Intimate Partner Violence: Patient Unable To Answer (10/04/2024)   Humiliation, Afraid, Rape, and Kick questionnaire    Fear of Current or Ex-Partner: Patient unable to answer    Emotionally Abused: Patient unable to answer    Physically Abused: Patient unable to answer    Sexually Abused: Patient unable to answer    FAMILY HISTORY: Family History  Problem Relation Age of Onset   Heart disease Mother    Heart disease Father    Breast cancer Sister  50    ALLERGIES:  is allergic to penicillins and sulfa antibiotics.  MEDICATIONS:  No current facility-administered medications on file prior to encounter.   Current Outpatient Medications on File Prior to Encounter  Medication Sig Dispense Refill   folic acid (FOLVITE) 1 MG tablet Take 1 tablet (1 mg total) by mouth daily. 90 tablet 0   furosemide  (LASIX ) 20 MG tablet Take 20 mg by mouth daily.     ibuprofen (ADVIL) 800 MG tablet Take 800 mg by mouth every 6 (six) hours as needed.     latanoprost  (XALATAN ) 0.005 % ophthalmic solution Place 1 drop into both eyes at bedtime. BRAND NAME     loratadine (CLARITIN) 10 MG tablet Take 10 mg by mouth daily as needed for allergies.     PONATinib  HCl (ICLUSIG ) 45 MG tablet Take 45 mg by mouth daily.     propranolol  (INDERAL ) 10 MG tablet Take 10 mg by mouth daily as needed (palpitations).      traMADol  (ULTRAM ) 50 MG tablet Take 50 mg by mouth every 6 (six) hours as needed for moderate pain.        REVIEW OF SYSTEMS:   Constitutional: Denies fevers, weight loss or abnormal night sweats Eyes: Denies visual change Ears, nose, mouth, throat, and face: Denies sore throat or enlarged tongue Respiratory: Denies cough, shortness of breath or wheezes Cardiovascular: Denies palpitation, chest discomfort or chest pain Gastrointestinal:  Denies nausea, vomiting, diarrhea, constipation, heartburn or abdominal pain GU: Denies any hesitancy, dysuria, frequency, hematuria Skin: Denies abnormal skin rashes Lymphatics: Denies new lymphadenopathy or mass Neurological: Denies numbness, tingling or new weaknesses All other systems were reviewed with the patient and are negative.  PHYSICAL EXAMINATION: ECOG PERFORMANCE STATUS: 2  Vitals:   10/07/24 0630 10/07/24 1200  BP: (!) 117/58 117/66  Pulse: (!) 105   Resp: 20   Temp: 98.2 F (36.8 C)   SpO2: 94%    Filed Weights   10/04/24 1434  Weight: 160 lb 7.9 oz (72.8 kg)    GENERAL:alert, no distress and comfortable SKIN: skin color normal. EYES: sclera clear OROPHARYNX: no exudate, moist NECK: supple. No mass LYMPH:  no palpable cervical lymphadenopathy LUNGS: Few crackles at the base HEART: regular rate & rhythm and no murmurs ABDOMEN: abdomen soft, non-tender  NEURO: alert & oriented x 3 with fluent speech  LABORATORY DATA:  I have reviewed the data as listed Lab Results  Component Value Date   WBC 3.5 (L) 10/07/2024   HGB 9.0 (L) 10/07/2024   HCT 26.3 (L) 10/07/2024   MCV 85.4 10/07/2024   PLT 101 (L) 10/07/2024   Recent Labs    10/04/24 0431 10/05/24 0426 10/06/24 0751 10/07/24 0408  NA 135 136 135 133*  K 3.9 3.7 3.7 3.8  CL 97* 100 98 97*  CO2 24 24 21* 22  GLUCOSE 104* 131* 97 118*  BUN 11 10 10 13   CREATININE 0.62 0.62 0.57 0.57  CALCIUM 8.6* 8.3* 8.6* 8.4*  GFRNONAA >60 >60 >60 >60  PROT 5.9* 5.5*  --   5.6*  ALBUMIN 3.0* 2.9* 3.0* 2.8*  AST 35 28  --  29  ALT 7 7  --  7  ALKPHOS 80 65  --  62  BILITOT 0.6 0.5  --  0.6    RADIOGRAPHIC STUDIES: I have personally reviewed the radiological images as listed and agreed with the findings in the report. DG CHEST PORT 1 VIEW Result Date: 10/06/2024 EXAM: 1 VIEW(S) XRAY OF  THE CHEST 10/06/2024 10:41:00 AM COMPARISON: 10/03/2024 CLINICAL HISTORY: Pneumonia FINDINGS: LUNGS AND PLEURA: Blunting of both costophrenic sulci with increased airspace opacities in the lung bases. No pneumothorax. HEART AND MEDIASTINUM: Mild cardiomegaly. BONES AND SOFT TISSUES: Multilevel degenerative disc disease of the spine. IMPRESSION: 1. Increasing hazy airspace opacities in both lung bases with blunting of both costophrenic sulci, possibly representing worsening small effusions with bibasilar atelectasis. Electronically signed by: Rogelia Myers MD 10/06/2024 04:53 PM EST RP Workstation: [id number]   EEG adult Result Date: 10/05/2024 Shelton Arlin KIDD, MD     10/05/2024  4:10 PM Patient Name: KRYSTEENA STALKER MRN: [id number] Epilepsy Attending: Arlin KIDD Shelton Referring Physician/Provider: Michaela Aisha SQUIBB, MD Date: 10/05/2024 Duration: 24 minutes Patient history: 78 year old female with altered mental status.  EEG to assess for seizure. Level of alertness: Awake, asleep AEDs during EEG study: None Technical aspects: This EEG study was done with scalp electrodes positioned according to the 10-20 International system of electrode placement. Electrical activity was reviewed with band pass filter of 1-70Hz , sensitivity of 7 uV/mm, display speed of 41mm/sec with a 60Hz  notched filter applied as appropriate. EEG data were recorded continuously and digitally stored.  Video monitoring was available and reviewed as appropriate. Description: The posterior dominant rhythm consists of 8-9Hz  activity of moderate voltage (25-35 uV) seen predominantly in posterior head regions,  symmetric and reactive to eye opening and eye closing. Sleep was characterized by vertex waves, sleep spindles (12 to 14 Hz), maximal frontocentral region. Hyperventilation and photic stimulation were not performed.   IMPRESSION: This study is within normal limits. No seizures or epileptiform discharges were seen throughout the recording. A normal interictal EEG does not exclude the diagnosis of epilepsy. Priyanka O Yadav   VAS US  LOWER EXTREMITY VENOUS (DVT) Result Date: 10/04/2024  Lower Venous DVT Study Patient Name:  NATAHSA MARIAN  Date of Exam:   10/04/2024 Medical Rec #: [id number]         Accession #:    [id number] Date of Birth: 1946/06/20        Patient Gender: F Patient Age:   33 years Exam Location:  [facility] Procedure:      VAS US  LOWER EXTREMITY VENOUS (DVT) Referring Phys: REDIA CLEAVER --------------------------------------------------------------------------------  Indications: Edema.  Risk Factors: Diabetes. Comparison Study: 11/20/23 - Negative Performing Technologist: Ricka Sturdivant-Jones RDMS, RVT  Examination Guidelines: A complete evaluation includes B-mode imaging, spectral Doppler, color Doppler, and power Doppler as needed of all accessible portions of each vessel. Bilateral testing is considered an integral part of a complete examination. Limited examinations for reoccurring indications may be performed as noted. The reflux portion of the exam is performed with the patient in reverse Trendelenburg.  +---------+---------------+---------+-----------+----------+--------------+ RIGHT    CompressibilityPhasicitySpontaneityPropertiesThrombus Aging +---------+---------------+---------+-----------+----------+--------------+ CFV      Full           Yes      Yes                                 +---------+---------------+---------+-----------+----------+--------------+ SFJ      Full                                                         +---------+---------------+---------+-----------+----------+--------------+ FV Prox  Full                                                        +---------+---------------+---------+-----------+----------+--------------+  FV Mid   Full           Yes      Yes                                 +---------+---------------+---------+-----------+----------+--------------+ FV DistalFull                                                        +---------+---------------+---------+-----------+----------+--------------+ PFV      Full                                                        +---------+---------------+---------+-----------+----------+--------------+ POP      Full           Yes      Yes                                 +---------+---------------+---------+-----------+----------+--------------+ PTV      Full                                                        +---------+---------------+---------+-----------+----------+--------------+ PERO     Full                                                        +---------+---------------+---------+-----------+----------+--------------+   +---------+---------------+---------+-----------+----------+--------------+ LEFT     CompressibilityPhasicitySpontaneityPropertiesThrombus Aging +---------+---------------+---------+-----------+----------+--------------+ CFV      Full           Yes      Yes                                 +---------+---------------+---------+-----------+----------+--------------+ SFJ      Full                                                        +---------+---------------+---------+-----------+----------+--------------+ FV Prox  Full                                                        +---------+---------------+---------+-----------+----------+--------------+ FV Mid   Full           Yes      Yes                                  +---------+---------------+---------+-----------+----------+--------------+  FV DistalFull                                                        +---------+---------------+---------+-----------+----------+--------------+ PFV      Full                                                        +---------+---------------+---------+-----------+----------+--------------+ POP      Full           Yes      Yes                                 +---------+---------------+---------+-----------+----------+--------------+ PTV      Full                                                        +---------+---------------+---------+-----------+----------+--------------+ PERO     Full                                                        +---------+---------------+---------+-----------+----------+--------------+ Small popliteal fossa cyst measuring 2.3 x 0.80 cm.    Summary: BILATERAL: - No evidence of deep vein thrombosis seen in the lower extremities, bilaterally. - LEFT: - A cystic structure is found in the popliteal fossa.  *See table(s) above for measurements and observations. Electronically signed by Penne Colorado MD on 10/04/2024 at 5:05:34 PM.    Final    MR BRAIN WO CONTRAST Result Date: 10/04/2024 EXAM: MRI BRAIN WITHOUT CONTRAST 10/04/2024 08:08:43 AM TECHNIQUE: Multiplanar multisequence MRI of the head/brain was performed without the administration of intravenous contrast. COMPARISON: None available. CLINICAL HISTORY: 78 year old female with confusion and altered mental status. FINDINGS: BRAIN AND VENTRICLES: No acute infarct. No intracranial hemorrhage. No mass. No midline shift. No hydrocephalus. The sella is unremarkable. Normal flow voids. Evidence of a punctate focus within the body of the corpus callosum slightly to the left of midline on series 13 image 23. There is perhaps subtle associated T2 hyperintensity there on coronal images; however, axial FLAIR and T2 do not corroborate  the lesion. No other convincing diffusion restriction is identified. This finding is nonspecific and can be seen in the setting of renal failure, hypertension, metabolic disturbances (including hypoglycemia, hypo/hypernatremia), ischemia, seizure, infection, demyelination, radiation, and chemotherapy. Background gray and white matter signal is normal for age. No cortical encephalomalacia or chronic cerebral blood products. ORBITS: No acute abnormality. SINUSES AND MASTOIDS: No acute abnormality. BONES AND SOFT TISSUES: Bone marrow signal is within normal limits. No acute soft tissue abnormality. Cervical spine degeneration is partially visible, including evidence of mild or moderate multifactorial spinal stenosis at C2-C3 related to disc and posterior ligamentous degeneration (series 7 image 13). IMPRESSION: 1. Punctate left body corpus callosum diffusion abnormality suspected, although not well correlated  on T2 / FLAIR. This is nonspecific and possible etiologies include metabolic disturbance, small vessel ischemia, seizure, infection, demyelination, or radiation and chemotherapy. 2. Otherwise Normal for age non-contrast MRI appearance of the brain. Electronically signed by: Helayne Hurst MD 10/04/2024 08:34 AM EST RP Workstation: [id number]   CT Head Wo Contrast Result Date: 10/03/2024 EXAM: CT HEAD WITHOUT CONTRAST 10/03/2024 06:39:09 PM TECHNIQUE: CT of the head was performed without the administration of intravenous contrast. Automated exposure control, iterative reconstruction, and/or weight based adjustment of the mA/kV was utilized to reduce the radiation dose to as low as reasonably achievable. COMPARISON: 09/15/2024 CLINICAL HISTORY: Delirium FINDINGS: BRAIN AND VENTRICLES: No acute hemorrhage. No evidence of acute infarct. No hydrocephalus. No extra-axial collection. No mass effect or midline shift. Generalized volume loss. Vascular calcifications. ORBITS: Bilateral lens replacement. SINUSES: No acute  abnormality. SOFT TISSUES AND SKULL: No acute soft tissue abnormality. No skull fracture. IMPRESSION: 1. No acute intracranial abnormality. Electronically signed by: Norman Gatlin MD 10/03/2024 07:14 PM EST RP Workstation: [id number]   DG Chest Port 1 View Result Date: 10/03/2024 CLINICAL DATA:  Questionable sepsis EXAM: PORTABLE CHEST 1 VIEW COMPARISON:  Chest x-ray 09/15/2024 FINDINGS: There is some hazy and patchy opacities in the right lung base which are new from prior. There is likely a small right pleural effusion. Cardiomediastinal silhouette is within normal limits. There is no pneumothorax or acute fracture. IMPRESSION: New hazy and patchy opacities in the right lung base with likely small right pleural effusion. Findings are concerning for pneumonia. Electronically Signed   By: Greig Pique M.D.   On: 10/03/2024 18:41   CT Angio Chest PE W and/or Wo Contrast Result Date: 09/15/2024 CLINICAL DATA:  Dizziness and fatigue. Concern for pulmonary embolism. Abdominal pain. EXAM: CT ANGIOGRAPHY CHEST CT ABDOMEN AND PELVIS WITH CONTRAST TECHNIQUE: Multidetector CT imaging of the chest was performed using the standard protocol during bolus administration of intravenous contrast. Multiplanar CT image reconstructions and MIPs were obtained to evaluate the vascular anatomy. Multidetector CT imaging of the abdomen and pelvis was performed using the standard protocol during bolus administration of intravenous contrast. RADIATION DOSE REDUCTION: This exam was performed according to the departmental dose-optimization program which includes automated exposure control, adjustment of the mA and/or kV according to patient size and/or use of iterative reconstruction technique. CONTRAST:  OMNIPAQUE IOHEXOL 350 MG/ML SOLN COMPARISON:  CT abdomen pelvis dated 08/02/2024 FINDINGS: Evaluation of this exam is limited due to respiratory motion and streak artifact caused by patient's arms. CTA CHEST FINDINGS  Cardiovascular: There is no cardiomegaly or pericardial effusion. The thoracic aorta is unremarkable. The origins of the great vessels of the aortic arch are patent. Evaluation of the pulmonary arteries is limited due to respiratory motion. No central pulmonary artery embolus identified. Mediastinum/Nodes: No hilar or mediastinal adenopathy. Mild thickened appearance of the esophagus may be related to underdistention. Esophagitis or underlying esophageal mass is not excluded. Clinical correlation is recommended. Lungs/Pleura: Small right pleural effusion. Subsegmental right lung base atelectasis. Pneumonia is not excluded. There is no pneumothorax. The central airways are patent. Musculoskeletal: Degenerative changes of the spine. No acute osseous pathology. Review of the MIP images confirms the above findings. CT ABDOMEN and PELVIS FINDINGS No intra-abdominal free air or free fluid. Hepatobiliary: Similar appearance of faint hypodense lesions in the left lobe of the liver not characterized on this CT. This can be better evaluated with ultrasound or MRI on a nonemergent/outpatient basis. No biliary dilatation. No calcified gallstone. Pancreas: The pancreas is  suboptimally evaluated due to respiratory motion but grossly unremarkable. Spleen: Normal in size without focal abnormality. Adrenals/Urinary Tract: The adrenal glands unremarkable. The kidneys, and urinary bladder unremarkable. Stomach/Bowel: There is no bowel obstruction or active inflammation. The appendix is normal. Vascular/Lymphatic: Mild aortoiliac atherosclerotic disease. The IVC is unremarkable. No portal venous gas. Indeterminate 1.8 x 2.9 cm infiltrative tissue in the portacaval region (20/4) of indeterminate etiology but suspicious for adenopathy or retroperitoneal fibrosis or a neoplastic process. This can be better evaluated with MRI without and with contrast on a nonemergent basis. Reproductive: The uterus is grossly unremarkable. No suspicious  adnexal masses. Other: None Musculoskeletal: Degenerative changes of the spine and hips. No acute osseous pathology. IMPRESSION: 1. No CT evidence of central pulmonary artery embolus. 2. Small right pleural effusion with subsegmental right lung base atelectasis. Pneumonia is not excluded. 3. Mild thickened appearance of the esophagus may be related to underdistention. Esophagitis or underlying esophageal mass is not excluded. Endoscopy may provide better evaluation. 4. No bowel obstruction. Normal appendix. 5. Indeterminate 1.8 x 2.9 cm infiltrative tissue in the portacaval region of indeterminate etiology but suspicious for adenopathy or retroperitoneal fibrosis or a neoplastic process. This can be better evaluated with MRI without and with contrast on a nonemergent basis. 6.  Aortic Atherosclerosis (ICD10-I70.0). Electronically Signed   By: Vanetta Chou M.D.   On: 09/15/2024 20:54   CT ABDOMEN PELVIS W CONTRAST Result Date: 09/15/2024 CLINICAL DATA:  Dizziness and fatigue. Concern for pulmonary embolism. Abdominal pain. EXAM: CT ANGIOGRAPHY CHEST CT ABDOMEN AND PELVIS WITH CONTRAST TECHNIQUE: Multidetector CT imaging of the chest was performed using the standard protocol during bolus administration of intravenous contrast. Multiplanar CT image reconstructions and MIPs were obtained to evaluate the vascular anatomy. Multidetector CT imaging of the abdomen and pelvis was performed using the standard protocol during bolus administration of intravenous contrast. RADIATION DOSE REDUCTION: This exam was performed according to the departmental dose-optimization program which includes automated exposure control, adjustment of the mA and/or kV according to patient size and/or use of iterative reconstruction technique. CONTRAST:  OMNIPAQUE IOHEXOL 350 MG/ML SOLN COMPARISON:  CT abdomen pelvis dated 08/02/2024 FINDINGS: Evaluation of this exam is limited due to respiratory motion and streak artifact caused by  patient's arms. CTA CHEST FINDINGS Cardiovascular: There is no cardiomegaly or pericardial effusion. The thoracic aorta is unremarkable. The origins of the great vessels of the aortic arch are patent. Evaluation of the pulmonary arteries is limited due to respiratory motion. No central pulmonary artery embolus identified. Mediastinum/Nodes: No hilar or mediastinal adenopathy. Mild thickened appearance of the esophagus may be related to underdistention. Esophagitis or underlying esophageal mass is not excluded. Clinical correlation is recommended. Lungs/Pleura: Small right pleural effusion. Subsegmental right lung base atelectasis. Pneumonia is not excluded. There is no pneumothorax. The central airways are patent. Musculoskeletal: Degenerative changes of the spine. No acute osseous pathology. Review of the MIP images confirms the above findings. CT ABDOMEN and PELVIS FINDINGS No intra-abdominal free air or free fluid. Hepatobiliary: Similar appearance of faint hypodense lesions in the left lobe of the liver not characterized on this CT. This can be better evaluated with ultrasound or MRI on a nonemergent/outpatient basis. No biliary dilatation. No calcified gallstone. Pancreas: The pancreas is suboptimally evaluated due to respiratory motion but grossly unremarkable. Spleen: Normal in size without focal abnormality. Adrenals/Urinary Tract: The adrenal glands unremarkable. The kidneys, and urinary bladder unremarkable. Stomach/Bowel: There is no bowel obstruction or active inflammation. The appendix is normal. Vascular/Lymphatic:  Mild aortoiliac atherosclerotic disease. The IVC is unremarkable. No portal venous gas. Indeterminate 1.8 x 2.9 cm infiltrative tissue in the portacaval region (20/4) of indeterminate etiology but suspicious for adenopathy or retroperitoneal fibrosis or a neoplastic process. This can be better evaluated with MRI without and with contrast on a nonemergent basis. Reproductive: The uterus is  grossly unremarkable. No suspicious adnexal masses. Other: None Musculoskeletal: Degenerative changes of the spine and hips. No acute osseous pathology. IMPRESSION: 1. No CT evidence of central pulmonary artery embolus. 2. Small right pleural effusion with subsegmental right lung base atelectasis. Pneumonia is not excluded. 3. Mild thickened appearance of the esophagus may be related to underdistention. Esophagitis or underlying esophageal mass is not excluded. Endoscopy may provide better evaluation. 4. No bowel obstruction. Normal appendix. 5. Indeterminate 1.8 x 2.9 cm infiltrative tissue in the portacaval region of indeterminate etiology but suspicious for adenopathy or retroperitoneal fibrosis or a neoplastic process. This can be better evaluated with MRI without and with contrast on a nonemergent basis. 6.  Aortic Atherosclerosis (ICD10-I70.0). Electronically Signed   By: Vanetta Chou M.D.   On: 09/15/2024 20:54   CT Head Wo Contrast Result Date: 09/15/2024 CLINICAL DATA:  Poly trauma fall dizzy EXAM: CT HEAD WITHOUT CONTRAST CT CERVICAL SPINE WITHOUT CONTRAST TECHNIQUE: Multidetector CT imaging of the head and cervical spine was performed following the standard protocol without intravenous contrast. Multiplanar CT image reconstructions of the cervical spine were also generated. RADIATION DOSE REDUCTION: This exam was performed according to the departmental dose-optimization program which includes automated exposure control, adjustment of the mA and/or kV according to patient size and/or use of iterative reconstruction technique. COMPARISON:  Radiograph 08/18/2018, CT brain 09/13/2024 FINDINGS: CT HEAD FINDINGS Brain: No acute territorial infarction, hemorrhage or intracranial mass. Mild atrophy. Stable ventricle size. Partially empty sella Vascular: No hyperdense vessels.  Carotid vascular calcification. Skull: Normal. Negative for fracture or focal lesion. Sinuses/Orbits: No acute finding. Other:  None CT CERVICAL SPINE FINDINGS Alignment: Straightening of the cervical spine. No subluxation. Facet alignment is normal Skull base and vertebrae: No acute fracture. No primary bone lesion or focal pathologic process. Soft tissues and spinal canal: No prevertebral fluid or swelling. No visible canal hematoma. Disc levels: Moderate severe diffuse disc space narrowing C3 through T1. No high-grade canal stenosis Upper chest: Negative. Other: None IMPRESSION: 1. No CT evidence for acute intracranial abnormality. Mild atrophy. 2. Straightening of the cervical spine with degenerative changes. No acute osseous abnormality. Electronically Signed   By: Luke Bun M.D.   On: 09/15/2024 17:19   CT Cervical Spine Wo Contrast Result Date: 09/15/2024 CLINICAL DATA:  Poly trauma fall dizzy EXAM: CT HEAD WITHOUT CONTRAST CT CERVICAL SPINE WITHOUT CONTRAST TECHNIQUE: Multidetector CT imaging of the head and cervical spine was performed following the standard protocol without intravenous contrast. Multiplanar CT image reconstructions of the cervical spine were also generated. RADIATION DOSE REDUCTION: This exam was performed according to the departmental dose-optimization program which includes automated exposure control, adjustment of the mA and/or kV according to patient size and/or use of iterative reconstruction technique. COMPARISON:  Radiograph 08/18/2018, CT brain 09/13/2024 FINDINGS: CT HEAD FINDINGS Brain: No acute territorial infarction, hemorrhage or intracranial mass. Mild atrophy. Stable ventricle size. Partially empty sella Vascular: No hyperdense vessels.  Carotid vascular calcification. Skull: Normal. Negative for fracture or focal lesion. Sinuses/Orbits: No acute finding. Other: None CT CERVICAL SPINE FINDINGS Alignment: Straightening of the cervical spine. No subluxation. Facet alignment is normal Skull base and vertebrae: No  acute fracture. No primary bone lesion or focal pathologic process. Soft tissues  and spinal canal: No prevertebral fluid or swelling. No visible canal hematoma. Disc levels: Moderate severe diffuse disc space narrowing C3 through T1. No high-grade canal stenosis Upper chest: Negative. Other: None IMPRESSION: 1. No CT evidence for acute intracranial abnormality. Mild atrophy. 2. Straightening of the cervical spine with degenerative changes. No acute osseous abnormality. Electronically Signed   By: Luke Bun M.D.   On: 09/15/2024 17:19   DG Chest Portable 1 View Result Date: 09/15/2024 EXAM: 1 VIEW(S) XRAY OF THE CHEST 09/15/2024 03:23:07 PM COMPARISON: 07/21/2024 CLINICAL HISTORY: fever FINDINGS: LUNGS AND PLEURA: Low lung volumes. No focal pulmonary opacity. No pulmonary edema. No pleural effusion. No pneumothorax. HEART AND MEDIASTINUM: No acute abnormality of the cardiac and mediastinal silhouettes. BONES AND SOFT TISSUES: Bilateral shoulder degenerative changes. Thoracic spondylosis. IMPRESSION: 1. No acute cardiopulmonary process. 2. Low lung volumes. Electronically signed by: Waddell Calk MD 09/15/2024 03:51 PM EDT RP Workstation: [id number]   CT HEAD WO CONTRAST ( ) Result Date: 09/13/2024 EXAM: CT HEAD WITHOUT CONTRAST 09/13/2024 01:19:00 PM TECHNIQUE: CT of the head was performed without the administration of intravenous contrast. Automated exposure control, iterative reconstruction, and/or weight based adjustment of the mA/kV was utilized to reduce the radiation dose to as low as reasonably achievable. COMPARISON: None available. CLINICAL HISTORY: Pt fell 1 day ago dizziness ; No hx of strokes or seizures. FINDINGS: BRAIN AND VENTRICLES: No acute hemorrhage. No evidence of acute infarct. Mild cerebral volume loss with ex vacuo ventricular dilatation. Partial empty sella. No extra-axial collection. No mass effect or midline shift. ORBITS: Status post bilateral lens replacements. SINUSES: No acute abnormality. SOFT TISSUES AND SKULL: No acute soft tissue abnormality.  Hyperostosis frontalis. No skull fracture. IMPRESSION: 1. No acute intracranial abnormality related to the reported fall and dizziness. 2. Mild cerebral volume loss with ex vacuo ventricular dilatation and partial empty sella. Electronically signed by: Franky Stanford MD 09/13/2024 01:33 PM EDT RP Workstation: [id number]

## 2024-10-07 NOTE — Consult Note (Addendum)
 [facility]                                                                                        Patient Identification: Patient Name: Erica [name] MRN: [id number] Admit Date: 10/03/2024  2:42 PM Today's Date: 10/07/2024 Reason for consult: fevers Requesting provider: Dr Raenelle   Principal Problem:   Acute encephalopathy Active Problems:   Symptomatic anemia   Chronic myeloid leukemia (HCC)   Diabetes mellitus type 2 in nonobese [facility])   Essential hypertension   Peripheral neuropathy   Acute on chronic heart failure with preserved ejection fraction (HFpEF) (HCC)   UTI (urinary tract infection)   Pneumonia   Antibiotics:  Ceftriaxone 11/10- Azithromycin 11/10-  Lines/Hardware:  Assessment # Intermittent fevers of unclear cause  # Pancytopenia  # Pleural effusion with probable PNA - Getting thoracentesis today  # ? UTI   # Symptomatic anemia - s/p 2 U PRBCs this admission   # CML- Ponatinib  on hold. Oncology consulted   # Encephalopathy - seems to have resolved   Recommendations  - will DC ceftriaxone and azithromycin and start Unasyn  - fu blood cultures  - fu pleural fluid analysis and cultures from today  - fu TTE  - will order UE US  duplex to r/o DVT  - Monitor CBC and BMP on antibiotics - Universal/standard isolation precaution Following peripherally over the weekend.  New ID team on Monday  Addendum: h/o penicillin allergy in chart and will keep on IV ceftriaxone for now.   Rest of the management as per the primary team. Please call with questions or concerns.  Thank you for the consult  __________________________________________________________________________________________________________ HPI and Hospital Course: 78 year old female with prior history of CML on Ponatinib , HTN, HLD, peripheral neuropathy was brought to the ED by son on 11/10 as son found  her being profoundly weak for a week with poor appetite, generalized weakness, increasing peripheral edema, confusion and fall at home. Did not have fever at home. She reports being dizzy PTA. No reported cough, shortness of breath or chest pain or GI or GU symptoms during admit.   Recently hospitalized 10/23-10/29 for symptomatic anemia and possible sepsis treated with empiric antibiotics and blood transfusion. No infective source of fevers identified.   At ED febrile Labs remarkable for NA 133, BG 153, albumin 3.3, hemoglobin 6.5, down from 9.3, platelets 149  Influenza A/influenza B/RSV/SARS-CoV-2 negative UA turbid, small bilirubin, amber color, 20 ketones, trace leukocytes, trace nitrite, protein 100 many bacteria 0-5 RBC, 11-20 WBC.  Urine culture no growth  11/10 and 11/11 blood culture no growth 11/13 blood culture 2/2 sets no growth Imaging as below  Started on IVF, ceftriaxone 2 g IV, azithromycin 500 mg IV possible pneumonia/UTI and a unit of PRBC was given  ROS: General- Denies fever, chills, poor po appetite + HEENT - Denies headache, blurry vision, neck pain, sinus pain Chest - Denies any chest pain, SOB or cough CVS- Denies syncopal attacks, palpitations Abdomen- Denies any nausea, vomiting, abdominal pain, hematochezia and diarrhea Neuro - Denies any weakness, numbness, tingling sensation Psych - Denies any changes in mood  irritability or depressive symptoms GU- Denies any burning, dysuria, hematuria or increased frequency of urination Skin - denies any rashes/lesions MSK - denies any joint pain/swelling or restricted ROM   Past Medical History:  Diagnosis Date   Bilateral swelling of feet    Cancer (HCC)    Cataract    Diabetes mellitus without complication (HCC)    diet controlled   Eczema    Elevated cholesterol    GERD (gastroesophageal reflux disease)    Glaucoma    Hypertension    Mitral valve prolapse    Simple endometrial hyperplasia without atypia  07/2016   In endometrial polyp with surrounding endometrium inactive recommend follow up ultrasound for endometrial echo 1 year   Spondylisthesis    Past Surgical History:  Procedure Laterality Date   CESAREAN SECTION     DILATATION & CURETTAGE/HYSTEROSCOPY WITH MYOSURE N/A 08/05/2016   Procedure: DILATATION & CURETTAGE/HYSTEROSCOPY WITH MYOSURE;  Surgeon: Evalene SHAUNNA Organ, MD;  Location: [facility];  Service: Gynecology;  Laterality: N/A;   EXCISION OF SKIN TAG Left 08/05/2016   Procedure: EXCISION OF SKIN TAG;  Surgeon: Evalene SHAUNNA Organ, MD;  Location: [facility];  Service: Gynecology;  Laterality: Left;   MOUTH SURGERY     spinal tap     THORACENTESIS N/A 11/02/2023   Procedure: THORACENTESIS;  Surgeon: Annella Donnice SAUNDERS, MD;  Location: [facility]COPY;  Service: Pulmonary;  Laterality: N/A;    Scheduled Meds:  (feeding supplement) PROSource Plus  30 mL Oral BID BM   aspirin  EC  81 mg Oral Daily   enoxaparin  (LOVENOX ) injection  40 mg Subcutaneous Q24H   feeding supplement  1 Container Oral BID BM   folic acid  1 mg Oral Daily   insulin  aspart  0-6 Units Subcutaneous TID WC   latanoprost   1 drop Both Eyes QHS   midodrine   5 mg Oral TID WC   multivitamin with minerals  1 tablet Oral Daily   Continuous Infusions:  azithromycin 500 mg (10/06/24 1027)   cefTRIAXone (ROCEPHIN)  IV 1 g (10/07/24 0915)   PRN Meds:.acetaminophen  **OR** acetaminophen , ondansetron  (ZOFRAN ) IV  Allergies  Allergen Reactions   Penicillins Hives   Sulfa Antibiotics Hives   Social History   Socioeconomic History   Marital status: Divorced    Spouse name: Not on file   Number of children: Not on file   Years of education: Not on file   Highest education level: Not on file  Occupational History   Not on file  Tobacco Use   Smoking status: Never   Smokeless tobacco: Never  Vaping Use   Vaping status: Never Used  Substance and Sexual Activity   Alcohol use: No    Alcohol/week: 0.0 standard drinks of  alcohol   Drug use: No   Sexual activity: Not Currently    Birth control/protection: Post-menopausal    Comment: 1st intercourse 78 yo-Fewer than 5 partners  Other Topics Concern   Not on file  Social History Narrative   Not on file   Social Drivers of Health   Financial Resource Strain: Not on file  Food Insecurity: Patient Unable To Answer (10/04/2024)   Hunger Vital Sign    Worried About Running Out of Food in the Last Year: Patient unable to answer    Ran Out of Food in the Last Year: Patient unable to answer  Transportation Needs: No Transportation Needs (10/04/2024)   PRAPARE - Administrator, Civil Service (Medical): No  Lack of Transportation (Non-Medical): No  Physical Activity: Not on file  Stress: Not on file  Social Connections: Unknown (10/04/2024)   Social Connection and Isolation Panel    Frequency of Communication with Friends and Family: Patient unable to answer    Frequency of Social Gatherings with Friends and Family: Once a week    Attends Religious Services: Patient unable to answer    Active Member of Clubs or Organizations: Patient unable to answer    Attends Banker Meetings: Patient unable to answer    Marital Status: Patient unable to answer  Intimate Partner Violence: Patient Unable To Answer (10/04/2024)   Humiliation, Afraid, Rape, and Kick questionnaire    Fear of Current or Ex-Partner: Patient unable to answer    Emotionally Abused: Patient unable to answer    Physically Abused: Patient unable to answer    Sexually Abused: Patient unable to answer   Family History  Problem Relation Age of Onset   Heart disease Mother    Heart disease Father    Breast cancer Sister 70   Vitals BP (!) 117/58 (BP Location: Left Arm)   Pulse (!) 105   Temp 98.2 F (36.8 C) (Oral)   Resp 20   Ht 4' 11 (1.499 m)   Wt 72.8 kg   SpO2 94%   BMI 32.42 kg/m    Physical Exam Constitutional:  elderly female sitting in the recliner,  non toxic appearing     Comments: HEENT wnl  Cardiovascular:     Rate and Rhythm: Normal rate     Heart sounds: s1s2  Pulmonary:     Effort: Pulmonary effort is normal.     Comments: Normal breath sounds   Abdominal:     Palpations: Abdomen is soft.     Tenderness: non distended and non tender  Musculoskeletal:        General: No swelling or tenderness in peripheral joints   Skin:    Comments: no rashes   Neurological:     General: awake, alert and oriented, non focal exam   Psychiatric:        Mood and Affect: Mood normal.    Pertinent Microbiology Results for orders placed or performed during the hospital encounter of 10/03/24  Blood Culture (routine x 2)     Status: None (Preliminary result)   Collection Time: 10/03/24  5:51 PM   Specimen: BLOOD  Result Value Ref Range Status   Specimen Description   Final    BLOOD LEFT ANTECUBITAL Performed at [facility], [address], [place], [place] [address]    Special Requests   Final    BOTTLES DRAWN AEROBIC AND ANAEROBIC Blood Culture adequate volume Performed at [facility], [address] [place], [place] [address]    Culture   Final    NO GROWTH 4 DAYS Performed at [facility], [address], [place], [place] [address]    Report Status PENDING  Incomplete  Resp panel by RT-PCR (RSV, Flu A&B, Covid) Anterior Nasal Swab     Status: None   Collection Time: 10/03/24  6:50 PM   Specimen: Anterior Nasal Swab  Result Value Ref Range Status   SARS Coronavirus 2 by RT PCR NEGATIVE NEGATIVE Final    Comment: (NOTE) SARS-CoV-2 target nucleic acids are NOT DETECTED.  The SARS-CoV-2 RNA is generally detectable in upper respiratory specimens during the acute phase of infection. The lowest concentration of SARS-CoV-2 viral copies this assay  can detect is 138 copies/mL. A negative result does not preclude SARS-Cov-2 infection and should not be used as the sole basis for  treatment or other patient management decisions. A negative result may occur with  improper specimen collection/handling, submission of specimen other than nasopharyngeal swab, presence of viral mutation(s) within the areas targeted by this assay, and inadequate number of viral copies(<138 copies/mL). A negative result must be combined with clinical observations, patient history, and epidemiological information. The expected result is Negative.  Fact Sheet for Patients:  bloggercourse.com  Fact Sheet for Healthcare Providers:  seriousbroker.it  This test is no t yet approved or cleared by the [place]  FDA and  has been authorized for detection and/or diagnosis of SARS-CoV-2 by FDA under an Emergency Use Authorization (EUA). This EUA will remain  in effect (meaning this test can be used) for the duration of the COVID-19 declaration under Section 564(b)(1) of the Act, 21 U.S.C.section 360bbb-3(b)(1), unless the authorization is terminated  or revoked sooner.       Influenza A by PCR NEGATIVE NEGATIVE Final   Influenza B by PCR NEGATIVE NEGATIVE Final    Comment: (NOTE) The Xpert Xpress SARS-CoV-2/FLU/RSV plus assay is intended as an aid in the diagnosis of influenza from Nasopharyngeal swab specimens and should not be used as a sole basis for treatment. Nasal washings and aspirates are unacceptable for Xpert Xpress SARS-CoV-2/FLU/RSV testing.  Fact Sheet for Patients: bloggercourse.com  Fact Sheet for Healthcare Providers: seriousbroker.it  This test is not yet approved or cleared by the [place]  FDA and has been authorized for detection and/or diagnosis of SARS-CoV-2 by FDA under an Emergency Use Authorization (EUA). This EUA will remain in effect (meaning this test can be used) for the duration of the COVID-19 declaration under Section 564(b)(1) of the Act, 21  U.S.C. section 360bbb-3(b)(1), unless the authorization is terminated or revoked.     Resp Syncytial Virus by PCR NEGATIVE NEGATIVE Final    Comment: (NOTE) Fact Sheet for Patients: bloggercourse.com  Fact Sheet for Healthcare Providers: seriousbroker.it  This test is not yet approved or cleared by the [place]  FDA and has been authorized for detection and/or diagnosis of SARS-CoV-2 by FDA under an Emergency Use Authorization (EUA). This EUA will remain in effect (meaning this test can be used) for the duration of the COVID-19 declaration under Section 564(b)(1) of the Act, 21 U.S.C. section 360bbb-3(b)(1), unless the authorization is terminated or revoked.  Performed at [facility], [address] [place], [place] [address]   Urine Culture     Status: None   Collection Time: 10/03/24  8:55 PM   Specimen: Urine, Random  Result Value Ref Range Status   Specimen Description   Final    URINE, RANDOM Performed at [facility], [address], [place], [place] [address]    Special Requests   Final    NONE Reflexed from [id number] Performed at [facility], [address] [place], [place] [address]    Culture   Final    NO GROWTH Performed at [facility], [address] [place], [place] [address]    Report Status 10/05/2024 FINAL  Final  Blood Culture (routine x 2)     Status: None (Preliminary result)   Collection Time: 10/04/24  4:31 AM   Specimen: BLOOD LEFT HAND  Result Value Ref Range Status   Specimen Description   Final    BLOOD LEFT HAND Performed at [facility], [address] [place], [place] [address]    Special Requests   Final    Blood Culture adequate volume BOTTLES DRAWN AEROBIC AND ANAEROBIC Performed at [facility], [address] [place], [place] [address]    Culture   Final    NO GROWTH 3 DAYS Performed  at [facility], [address], [place], [place] [address]    Report Status PENDING  Incomplete  Culture, blood (Routine X 2) w Reflex to ID Panel     Status: None (Preliminary result)   Collection Time: 10/06/24  1:45 PM   Specimen: BLOOD LEFT ARM  Result Value Ref Range Status   Specimen Description   Final    BLOOD LEFT ARM Performed at [facility], [address], [place], [place] [address]    Special Requests   Final    BOTTLES DRAWN AEROBIC ONLY Blood Culture adequate volume Performed at [facility], [address], [place], [place] [address]    Culture   Final    NO GROWTH < 24 HOURS Performed at [facility], [address], [place], [place] [address]    Report Status PENDING  Incomplete  Culture, blood (Routine X 2) w Reflex to ID Panel     Status: None (Preliminary result)   Collection Time: 10/06/24  1:56 PM   Specimen: BLOOD LEFT ARM  Result Value Ref Range Status   Specimen Description   Final    BLOOD LEFT ARM Performed at [facility], [address] [place], [place] [address]    Special Requests   Final    BOTTLES DRAWN AEROBIC AND ANAEROBIC Blood Culture adequate volume Performed at [facility], [address] [place], [place] [address]    Culture   Final    NO GROWTH < 24 HOURS Performed at [facility], [address], [place], [place] [address]    Report Status PENDING  Incomplete  Body fluid culture w Gram Stain     Status: None (Preliminary result)   Collection Time: 10/07/24 12:24 PM   Specimen: PATH Cytology Pleural fluid  Result Value Ref Range Status   Specimen Description   Final    PLEURAL Performed at [facility], [address], [place], [place] [address]    Special Requests   Final    NONE Performed at [facility], [address], [place], [place] [address]    Gram Stain   Final    FEW WBC PRESENT, PREDOMINANTLY  MONONUCLEAR NO ORGANISMS SEEN Performed at [facility], [address], [place], [place] [address]    Culture PENDING  Incomplete   Report Status PENDING  Incomplete    Pertinent Lab seen by me:    Latest Ref Rng & Units 10/07/2024    4:08 AM 10/06/2024    7:51 AM 10/05/2024    4:26 AM  CBC  WBC 4.0 - 10.5 K/uL 3.5  3.8  3.9   Hemoglobin 12.0 - 15.0 g/dL 9.0  9.2  6.8   Hematocrit 36.0 - 46.0 % 26.3  26.5  20.5   Platelets 150 - 400 K/uL 101  103  118       Latest Ref Rng & Units 10/07/2024    4:08 AM 10/06/2024    7:51 AM 10/05/2024    4:26 AM  CMP  Glucose 70 - 99 mg/dL 881  97  131   BUN 8 - 23 mg/dL 13  10  10    Creatinine 0.44 - 1.00 mg/dL 9.42  9.42  9.37   Sodium 135 - 145 mmol/L 133  135  136   Potassium 3.5 - 5.1 mmol/L 3.8  3.7  3.7   Chloride 98 - 111 mmol/L 97  98  100   CO2 22 - 32 mmol/L 22  21  24    Calcium 8.9 - 10.3 mg/dL 8.4  8.6  8.3   Total Protein 6.5 - 8.1 g/dL 5.6   5.5   Total Bilirubin 0.0 - 1.2 mg/dL 0.6   0.5   Alkaline Phos 38 - 126 U/L 62   65   AST 15 - 41 U/L 29   28   ALT 0 - 44 U/L 7   7      Pertinent Imagings/Other Imagings Plain films and CT images have been personally visualized and interpreted; radiology reports have been reviewed. Decision making incorporated into the Impression / Recommendations.  US  THORACENTESIS ASP PLEURAL SPACE W/IMG GUIDE Result Date: 10/07/2024 INDICATION: [id number] Pleural effusion on right [id number] Inpatient being treated for pneumonia with bilateral pleural effusion. Team requesting right diagnostic and therapeutic thoracentesis. EXAM: ULTRASOUND GUIDED RIGHT THORACENTESIS MEDICATIONS: 8 mL 1% lidocaine  COMPLICATIONS: None immediate. PROCEDURE: An ultrasound guided thoracentesis was thoroughly discussed with the patient and questions answered. The benefits, risks, alternatives and complications were also discussed. The patient understands and wishes to proceed with the procedure. Written consent was  obtained. Ultrasound was performed to localize and mark an adequate pocket of fluid in the right chest. The area was then prepped and draped in the normal sterile fashion. 1% Lidocaine  was used for local anesthesia. Under ultrasound guidance a 19 gauge, 7-cm, Yueh catheter was introduced with patient in lateral decubitus position. Thoracentesis was performed. The catheter was removed and a dressing applied. FINDINGS: A total of approximately 550 mL of clear, yellow fluid was removed. Samples were sent to the laboratory as requested by the clinical team. IMPRESSION: Successful ultrasound guided RIGHT thoracentesis yielding 550 mL of pleural fluid. Performed by Laymon Coast, NP under the supervision of Thom Hall, MD Electronically Signed   By: Thom Hall M.D.   On: 10/07/2024 15:55   DG Chest Port 1 View Result Date: 10/07/2024 CLINICAL DATA:  Post thoracentesis. EXAM: PORTABLE CHEST 1 VIEW COMPARISON:  Radiographs 10/06/2024 and 10/03/2024.  CT 09/15/2024. FINDINGS: 1226 hours. The heart size and mediastinal contours are stable. Interval decreased right pleural effusion with improved aeration of the right lung base. No evidence of pneumothorax. Probable residual small bilateral pleural effusions with left greater than right basilar airspace opacities. The bones appear unchanged. IMPRESSION: Interval decreased right pleural effusion with improved aeration of the right lung base. No evidence of pneumothorax. Electronically Signed   By: Elsie Perone M.D.   On: 10/07/2024 12:58   DG CHEST PORT 1 VIEW Result Date: 10/06/2024 EXAM: 1 VIEW(S) XRAY OF THE CHEST 10/06/2024 10:41:00 AM COMPARISON: 10/03/2024 CLINICAL HISTORY: Pneumonia FINDINGS: LUNGS AND PLEURA: Blunting of both costophrenic sulci with increased airspace opacities in the lung bases. No pneumothorax. HEART AND MEDIASTINUM: Mild cardiomegaly. BONES AND SOFT TISSUES: Multilevel degenerative disc disease of the spine. IMPRESSION: 1.  Increasing hazy airspace opacities in both lung bases with blunting of both costophrenic sulci, possibly representing worsening small effusions with bibasilar atelectasis. Electronically signed by: Rogelia Myers MD 10/06/2024 04:53 PM EST RP Workstation: [id number]   EEG adult Result Date: 10/05/2024 Yadav, Priyanka  MALVA, MD     10/05/2024  4:10 PM Patient Name: Erica [name] MRN: [id number] Epilepsy Attending: Arlin MALVA Krebs Referring Physician/Provider: Michaela Aisha SQUIBB, MD Date: 10/05/2024 Duration: 24 minutes Patient history: 78 year old female with altered mental status.  EEG to assess for seizure. Level of alertness: Awake, asleep AEDs during EEG study: None Technical aspects: This EEG study was done with scalp electrodes positioned according to the 10-20 International system of electrode placement. Electrical activity was reviewed with band pass filter of 1-70Hz , sensitivity of 7 uV/mm, display speed of 66mm/sec with a 60Hz  notched filter applied as appropriate. EEG data were recorded continuously and digitally stored.  Video monitoring was available and reviewed as appropriate. Description: The posterior dominant rhythm consists of 8-9Hz  activity of moderate voltage (25-35 uV) seen predominantly in posterior head regions, symmetric and reactive to eye opening and eye closing. Sleep was characterized by vertex waves, sleep spindles (12 to 14 Hz), maximal frontocentral region. Hyperventilation and photic stimulation were not performed.   IMPRESSION: This study is within normal limits. No seizures or epileptiform discharges were seen throughout the recording. A normal interictal EEG does not exclude the diagnosis of epilepsy. Priyanka O Yadav   VAS US  LOWER EXTREMITY VENOUS (DVT) Result Date: 10/04/2024  Lower Venous DVT Study Patient Name:  Erica [name]  Date of Exam:   10/04/2024 Medical Rec #: [id number]         Accession #:    [id number] Date of Birth: 17-Feb-1946        Patient Gender: F  Patient Age:   46 years Exam Location[facility]  [facility] Procedure:      VAS US  LOWER EXTREMITY VENOUS (DVT) Referring Phys: REDIA CLEAVER --------------------------------------------------------------------------------  Indications: Edema.  Risk Factors: Diabetes. Comparison Study: 11/20/23 - Negative Performing Technologist: Ricka Sturdivant-Jones RDMS, RVT  Examination Guidelines: A complete evaluation includes B-mode imaging, spectral Doppler, color Doppler, and power Doppler as needed of all accessible portions of each vessel. Bilateral testing is considered an integral part of a complete examination. Limited examinations for reoccurring indications may be performed as noted. The reflux portion of the exam is performed with the patient in reverse Trendelenburg.  +---------+---------------+---------+-----------+----------+--------------+ RIGHT    CompressibilityPhasicitySpontaneityPropertiesThrombus Aging +---------+---------------+---------+-----------+----------+--------------+ CFV      Full           Yes      Yes                                 +---------+---------------+---------+-----------+----------+--------------+ SFJ      Full                                                        +---------+---------------+---------+-----------+----------+--------------+ FV Prox  Full                                                        +---------+---------------+---------+-----------+----------+--------------+ FV Mid   Full           Yes      Yes                                 +---------+---------------+---------+-----------+----------+--------------+  FV DistalFull                                                        +---------+---------------+---------+-----------+----------+--------------+ PFV      Full                                                        +---------+---------------+---------+-----------+----------+--------------+ POP      Full            Yes      Yes                                 +---------+---------------+---------+-----------+----------+--------------+ PTV      Full                                                        +---------+---------------+---------+-----------+----------+--------------+ PERO     Full                                                        +---------+---------------+---------+-----------+----------+--------------+   +---------+---------------+---------+-----------+----------+--------------+ LEFT     CompressibilityPhasicitySpontaneityPropertiesThrombus Aging +---------+---------------+---------+-----------+----------+--------------+ CFV      Full           Yes      Yes                                 +---------+---------------+---------+-----------+----------+--------------+ SFJ      Full                                                        +---------+---------------+---------+-----------+----------+--------------+ FV Prox  Full                                                        +---------+---------------+---------+-----------+----------+--------------+ FV Mid   Full           Yes      Yes                                 +---------+---------------+---------+-----------+----------+--------------+ FV DistalFull                                                        +---------+---------------+---------+-----------+----------+--------------+  PFV      Full                                                        +---------+---------------+---------+-----------+----------+--------------+ POP      Full           Yes      Yes                                 +---------+---------------+---------+-----------+----------+--------------+ PTV      Full                                                        +---------+---------------+---------+-----------+----------+--------------+ PERO     Full                                                         +---------+---------------+---------+-----------+----------+--------------+ Small popliteal fossa cyst measuring 2.3 x 0.80 cm.    Summary: BILATERAL: - No evidence of deep vein thrombosis seen in the lower extremities, bilaterally. - LEFT: - A cystic structure is found in the popliteal fossa.  *See table(s) above for measurements and observations. Electronically signed by Penne Colorado MD on 10/04/2024 at 5:05:34 PM.    Final    MR BRAIN WO CONTRAST Result Date: 10/04/2024 EXAM: MRI BRAIN WITHOUT CONTRAST 10/04/2024 08:08:43 AM TECHNIQUE: Multiplanar multisequence MRI of the head/brain was performed without the administration of intravenous contrast. COMPARISON: None available. CLINICAL HISTORY: 78 year old female with confusion and altered mental status. FINDINGS: BRAIN AND VENTRICLES: No acute infarct. No intracranial hemorrhage. No mass. No midline shift. No hydrocephalus. The sella is unremarkable. Normal flow voids. Evidence of a punctate focus within the body of the corpus callosum slightly to the left of midline on series 13 image 23. There is perhaps subtle associated T2 hyperintensity there on coronal images; however, axial FLAIR and T2 do not corroborate the lesion. No other convincing diffusion restriction is identified. This finding is nonspecific and can be seen in the setting of renal failure, hypertension, metabolic disturbances (including hypoglycemia, hypo/hypernatremia), ischemia, seizure, infection, demyelination, radiation, and chemotherapy. Background gray and white matter signal is normal for age. No cortical encephalomalacia or chronic cerebral blood products. ORBITS: No acute abnormality. SINUSES AND MASTOIDS: No acute abnormality. BONES AND SOFT TISSUES: Bone marrow signal is within normal limits. No acute soft tissue abnormality. Cervical spine degeneration is partially visible, including evidence of mild or moderate multifactorial spinal stenosis at C2-C3 related to disc and  posterior ligamentous degeneration (series 7 image 13). IMPRESSION: 1. Punctate left body corpus callosum diffusion abnormality suspected, although not well correlated on T2 / FLAIR. This is nonspecific and possible etiologies include metabolic disturbance, small vessel ischemia, seizure, infection, demyelination, or radiation and chemotherapy. 2. Otherwise Normal for age non-contrast MRI appearance of the brain. Electronically signed by: Helayne Hurst MD 10/04/2024 08:34 AM EST RP Workstation: [id number]   CT Head Wo Contrast Result Date: 10/03/2024 EXAM:  CT HEAD WITHOUT CONTRAST 10/03/2024 06:39:09 PM TECHNIQUE: CT of the head was performed without the administration of intravenous contrast. Automated exposure control, iterative reconstruction, and/or weight based adjustment of the mA/kV was utilized to reduce the radiation dose to as low as reasonably achievable. COMPARISON: 09/15/2024 CLINICAL HISTORY: Delirium FINDINGS: BRAIN AND VENTRICLES: No acute hemorrhage. No evidence of acute infarct. No hydrocephalus. No extra-axial collection. No mass effect or midline shift. Generalized volume loss. Vascular calcifications. ORBITS: Bilateral lens replacement. SINUSES: No acute abnormality. SOFT TISSUES AND SKULL: No acute soft tissue abnormality. No skull fracture. IMPRESSION: 1. No acute intracranial abnormality. Electronically signed by: Norman Gatlin MD 10/03/2024 07:14 PM EST RP Workstation: [id number]   DG Chest Port 1 View Result Date: 10/03/2024 CLINICAL DATA:  Questionable sepsis EXAM: PORTABLE CHEST 1 VIEW COMPARISON:  Chest x-ray 09/15/2024 FINDINGS: There is some hazy and patchy opacities in the right lung base which are new from prior. There is likely a small right pleural effusion. Cardiomediastinal silhouette is within normal limits. There is no pneumothorax or acute fracture. IMPRESSION: New hazy and patchy opacities in the right lung base with likely small right pleural effusion. Findings are  concerning for pneumonia. Electronically Signed   By: Greig Pique M.D.   On: 10/03/2024 18:41   CT Angio Chest PE W and/or Wo Contrast Result Date: 09/15/2024 CLINICAL DATA:  Dizziness and fatigue. Concern for pulmonary embolism. Abdominal pain. EXAM: CT ANGIOGRAPHY CHEST CT ABDOMEN AND PELVIS WITH CONTRAST TECHNIQUE: Multidetector CT imaging of the chest was performed using the standard protocol during bolus administration of intravenous contrast. Multiplanar CT image reconstructions and MIPs were obtained to evaluate the vascular anatomy. Multidetector CT imaging of the abdomen and pelvis was performed using the standard protocol during bolus administration of intravenous contrast. RADIATION DOSE REDUCTION: This exam was performed according to the departmental dose-optimization program which includes automated exposure control, adjustment of the mA and/or kV according to patient size and/or use of iterative reconstruction technique. CONTRAST:  OMNIPAQUE IOHEXOL 350 MG/ML SOLN COMPARISON:  CT abdomen pelvis dated 08/02/2024 FINDINGS: Evaluation of this exam is limited due to respiratory motion and streak artifact caused by patient's arms. CTA CHEST FINDINGS Cardiovascular: There is no cardiomegaly or pericardial effusion. The thoracic aorta is unremarkable. The origins of the great vessels of the aortic arch are patent. Evaluation of the pulmonary arteries is limited due to respiratory motion. No central pulmonary artery embolus identified. Mediastinum/Nodes: No hilar or mediastinal adenopathy. Mild thickened appearance of the esophagus may be related to underdistention. Esophagitis or underlying esophageal mass is not excluded. Clinical correlation is recommended. Lungs/Pleura: Small right pleural effusion. Subsegmental right lung base atelectasis. Pneumonia is not excluded. There is no pneumothorax. The central airways are patent. Musculoskeletal: Degenerative changes of the spine. No acute osseous  pathology. Review of the MIP images confirms the above findings. CT ABDOMEN and PELVIS FINDINGS No intra-abdominal free air or free fluid. Hepatobiliary: Similar appearance of faint hypodense lesions in the left lobe of the liver not characterized on this CT. This can be better evaluated with ultrasound or MRI on a nonemergent/outpatient basis. No biliary dilatation. No calcified gallstone. Pancreas: The pancreas is suboptimally evaluated due to respiratory motion but grossly unremarkable. Spleen: Normal in size without focal abnormality. Adrenals/Urinary Tract: The adrenal glands unremarkable. The kidneys, and urinary bladder unremarkable. Stomach/Bowel: There is no bowel obstruction or active inflammation. The appendix is normal. Vascular/Lymphatic: Mild aortoiliac atherosclerotic disease. The IVC is unremarkable. No portal venous gas. Indeterminate 1.8 x  2.9 cm infiltrative tissue in the portacaval region (20/4) of indeterminate etiology but suspicious for adenopathy or retroperitoneal fibrosis or a neoplastic process. This can be better evaluated with MRI without and with contrast on a nonemergent basis. Reproductive: The uterus is grossly unremarkable. No suspicious adnexal masses. Other: None Musculoskeletal: Degenerative changes of the spine and hips. No acute osseous pathology. IMPRESSION: 1. No CT evidence of central pulmonary artery embolus. 2. Small right pleural effusion with subsegmental right lung base atelectasis. Pneumonia is not excluded. 3. Mild thickened appearance of the esophagus may be related to underdistention. Esophagitis or underlying esophageal mass is not excluded. Endoscopy may provide better evaluation. 4. No bowel obstruction. Normal appendix. 5. Indeterminate 1.8 x 2.9 cm infiltrative tissue in the portacaval region of indeterminate etiology but suspicious for adenopathy or retroperitoneal fibrosis or a neoplastic process. This can be better evaluated with MRI without and with  contrast on a nonemergent basis. 6.  Aortic Atherosclerosis (ICD10-I70.0). Electronically Signed   By: Vanetta Chou M.D.   On: 09/15/2024 20:54   CT ABDOMEN PELVIS W CONTRAST Result Date: 09/15/2024 CLINICAL DATA:  Dizziness and fatigue. Concern for pulmonary embolism. Abdominal pain. EXAM: CT ANGIOGRAPHY CHEST CT ABDOMEN AND PELVIS WITH CONTRAST TECHNIQUE: Multidetector CT imaging of the chest was performed using the standard protocol during bolus administration of intravenous contrast. Multiplanar CT image reconstructions and MIPs were obtained to evaluate the vascular anatomy. Multidetector CT imaging of the abdomen and pelvis was performed using the standard protocol during bolus administration of intravenous contrast. RADIATION DOSE REDUCTION: This exam was performed according to the departmental dose-optimization program which includes automated exposure control, adjustment of the mA and/or kV according to patient size and/or use of iterative reconstruction technique. CONTRAST:  OMNIPAQUE IOHEXOL 350 MG/ML SOLN COMPARISON:  CT abdomen pelvis dated 08/02/2024 FINDINGS: Evaluation of this exam is limited due to respiratory motion and streak artifact caused by patient's arms. CTA CHEST FINDINGS Cardiovascular: There is no cardiomegaly or pericardial effusion. The thoracic aorta is unremarkable. The origins of the great vessels of the aortic arch are patent. Evaluation of the pulmonary arteries is limited due to respiratory motion. No central pulmonary artery embolus identified. Mediastinum/Nodes: No hilar or mediastinal adenopathy. Mild thickened appearance of the esophagus may be related to underdistention. Esophagitis or underlying esophageal mass is not excluded. Clinical correlation is recommended. Lungs/Pleura: Small right pleural effusion. Subsegmental right lung base atelectasis. Pneumonia is not excluded. There is no pneumothorax. The central airways are patent. Musculoskeletal: Degenerative  changes of the spine. No acute osseous pathology. Review of the MIP images confirms the above findings. CT ABDOMEN and PELVIS FINDINGS No intra-abdominal free air or free fluid. Hepatobiliary: Similar appearance of faint hypodense lesions in the left lobe of the liver not characterized on this CT. This can be better evaluated with ultrasound or MRI on a nonemergent/outpatient basis. No biliary dilatation. No calcified gallstone. Pancreas: The pancreas is suboptimally evaluated due to respiratory motion but grossly unremarkable. Spleen: Normal in size without focal abnormality. Adrenals/Urinary Tract: The adrenal glands unremarkable. The kidneys, and urinary bladder unremarkable. Stomach/Bowel: There is no bowel obstruction or active inflammation. The appendix is normal. Vascular/Lymphatic: Mild aortoiliac atherosclerotic disease. The IVC is unremarkable. No portal venous gas. Indeterminate 1.8 x 2.9 cm infiltrative tissue in the portacaval region (20/4) of indeterminate etiology but suspicious for adenopathy or retroperitoneal fibrosis or a neoplastic process. This can be better evaluated with MRI without and with contrast on a nonemergent basis. Reproductive: The uterus is  grossly unremarkable. No suspicious adnexal masses. Other: None Musculoskeletal: Degenerative changes of the spine and hips. No acute osseous pathology. IMPRESSION: 1. No CT evidence of central pulmonary artery embolus. 2. Small right pleural effusion with subsegmental right lung base atelectasis. Pneumonia is not excluded. 3. Mild thickened appearance of the esophagus may be related to underdistention. Esophagitis or underlying esophageal mass is not excluded. Endoscopy may provide better evaluation. 4. No bowel obstruction. Normal appendix. 5. Indeterminate 1.8 x 2.9 cm infiltrative tissue in the portacaval region of indeterminate etiology but suspicious for adenopathy or retroperitoneal fibrosis or a neoplastic process. This can be better  evaluated with MRI without and with contrast on a nonemergent basis. 6.  Aortic Atherosclerosis (ICD10-I70.0). Electronically Signed   By: Vanetta Chou M.D.   On: 09/15/2024 20:54   CT Head Wo Contrast Result Date: 09/15/2024 CLINICAL DATA:  Poly trauma fall dizzy EXAM: CT HEAD WITHOUT CONTRAST CT CERVICAL SPINE WITHOUT CONTRAST TECHNIQUE: Multidetector CT imaging of the head and cervical spine was performed following the standard protocol without intravenous contrast. Multiplanar CT image reconstructions of the cervical spine were also generated. RADIATION DOSE REDUCTION: This exam was performed according to the departmental dose-optimization program which includes automated exposure control, adjustment of the mA and/or kV according to patient size and/or use of iterative reconstruction technique. COMPARISON:  Radiograph 08/18/2018, CT brain 09/13/2024 FINDINGS: CT HEAD FINDINGS Brain: No acute territorial infarction, hemorrhage or intracranial mass. Mild atrophy. Stable ventricle size. Partially empty sella Vascular: No hyperdense vessels.  Carotid vascular calcification. Skull: Normal. Negative for fracture or focal lesion. Sinuses/Orbits: No acute finding. Other: None CT CERVICAL SPINE FINDINGS Alignment: Straightening of the cervical spine. No subluxation. Facet alignment is normal Skull base and vertebrae: No acute fracture. No primary bone lesion or focal pathologic process. Soft tissues and spinal canal: No prevertebral fluid or swelling. No visible canal hematoma. Disc levels: Moderate severe diffuse disc space narrowing C3 through T1. No high-grade canal stenosis Upper chest: Negative. Other: None IMPRESSION: 1. No CT evidence for acute intracranial abnormality. Mild atrophy. 2. Straightening of the cervical spine with degenerative changes. No acute osseous abnormality. Electronically Signed   By: Luke Bun M.D.   On: 09/15/2024 17:19   CT Cervical Spine Wo Contrast Result Date:  09/15/2024 CLINICAL DATA:  Poly trauma fall dizzy EXAM: CT HEAD WITHOUT CONTRAST CT CERVICAL SPINE WITHOUT CONTRAST TECHNIQUE: Multidetector CT imaging of the head and cervical spine was performed following the standard protocol without intravenous contrast. Multiplanar CT image reconstructions of the cervical spine were also generated. RADIATION DOSE REDUCTION: This exam was performed according to the departmental dose-optimization program which includes automated exposure control, adjustment of the mA and/or kV according to patient size and/or use of iterative reconstruction technique. COMPARISON:  Radiograph 08/18/2018, CT brain 09/13/2024 FINDINGS: CT HEAD FINDINGS Brain: No acute territorial infarction, hemorrhage or intracranial mass. Mild atrophy. Stable ventricle size. Partially empty sella Vascular: No hyperdense vessels.  Carotid vascular calcification. Skull: Normal. Negative for fracture or focal lesion. Sinuses/Orbits: No acute finding. Other: None CT CERVICAL SPINE FINDINGS Alignment: Straightening of the cervical spine. No subluxation. Facet alignment is normal Skull base and vertebrae: No acute fracture. No primary bone lesion or focal pathologic process. Soft tissues and spinal canal: No prevertebral fluid or swelling. No visible canal hematoma. Disc levels: Moderate severe diffuse disc space narrowing C3 through T1. No high-grade canal stenosis Upper chest: Negative. Other: None IMPRESSION: 1. No CT evidence for acute intracranial abnormality. Mild atrophy. 2. Straightening  of the cervical spine with degenerative changes. No acute osseous abnormality. Electronically Signed   By: Luke Bun M.D.   On: 09/15/2024 17:19   DG Chest Portable 1 View Result Date: 09/15/2024 EXAM: 1 VIEW(S) XRAY OF THE CHEST 09/15/2024 03:23:07 PM COMPARISON: 07/21/2024 CLINICAL HISTORY: fever FINDINGS: LUNGS AND PLEURA: Low lung volumes. No focal pulmonary opacity. No pulmonary edema. No pleural effusion. No  pneumothorax. HEART AND MEDIASTINUM: No acute abnormality of the cardiac and mediastinal silhouettes. BONES AND SOFT TISSUES: Bilateral shoulder degenerative changes. Thoracic spondylosis. IMPRESSION: 1. No acute cardiopulmonary process. 2. Low lung volumes. Electronically signed by: Waddell Calk MD 09/15/2024 03:51 PM EDT RP Workstation: [id number]   CT HEAD WO CONTRAST ( ) Result Date: 09/13/2024 EXAM: CT HEAD WITHOUT CONTRAST 09/13/2024 01:19:00 PM TECHNIQUE: CT of the head was performed without the administration of intravenous contrast. Automated exposure control, iterative reconstruction, and/or weight based adjustment of the mA/kV was utilized to reduce the radiation dose to as low as reasonably achievable. COMPARISON: None available. CLINICAL HISTORY: Pt fell 1 day ago dizziness ; No hx of strokes or seizures. FINDINGS: BRAIN AND VENTRICLES: No acute hemorrhage. No evidence of acute infarct. Mild cerebral volume loss with ex vacuo ventricular dilatation. Partial empty sella. No extra-axial collection. No mass effect or midline shift. ORBITS: Status post bilateral lens replacements. SINUSES: No acute abnormality. SOFT TISSUES AND SKULL: No acute soft tissue abnormality. Hyperostosis frontalis. No skull fracture. IMPRESSION: 1. No acute intracranial abnormality related to the reported fall and dizziness. 2. Mild cerebral volume loss with ex vacuo ventricular dilatation and partial empty sella. Electronically signed by: Franky Stanford MD 09/13/2024 01:33 PM EDT RP Workstation: [id number]    I spent 84 minutes involved in face-to-face and non-face-to-face activities for this patient on the day of the visit. Professional time spent includes the following activities: Preparing to see the patient (review of tests), Obtaining and reviewing separately obtained history (ED note, H&P, hospitalist progress note), Performing a medically appropriate examination and evaluation, Ordering medications/labs, referring  and communicating with other health care professionals, Documenting clinical information in the EMR, Independently interpreting results (not separately reported), Communicating results to the patient/family member, Counseling and educating the patient/family member and Care coordination (not separately reported).  Electronically signed by:   Plan d/w requesting provider as well as ID pharm D  Of note, portions of this note may have been created with voice recognition software. While this note has been edited for accuracy, occasional wrong-word or 'sound-a-like' substitutions may have occurred due to the inherent limitations of voice recognition software.   Annalee Orem, MD Infectious Disease Physician [facility] Pager: [phone]

## 2024-10-07 NOTE — Assessment & Plan Note (Signed)
 Has been on empiric antibiotics and culture negative

## 2024-10-07 NOTE — Assessment & Plan Note (Signed)
 Differential is normal, leukocyte is not elevated.   May continue holding Ponatinib  until outpatient follow-up. Follow-up with Dr. Perri after discharge

## 2024-10-07 NOTE — Assessment & Plan Note (Signed)
 Presumed with right lung base hazy opacity

## 2024-10-07 NOTE — Assessment & Plan Note (Signed)
 Etiology not included clear.  Suspect metabolic, dehydration She is alert and oriented today when I examined her

## 2024-10-07 NOTE — Assessment & Plan Note (Signed)
 Fairly controlled

## 2024-10-07 NOTE — Assessment & Plan Note (Signed)
 Right thoracentesis obtained today.  Will check echo

## 2024-10-07 NOTE — Progress Notes (Signed)
 Occupational Therapy Treatment Patient Details Name: Erica [name] MRN: [id number] DOB: [birth year] Today's Date: 10/07/2024   History of present illness Erica [name] is a 78 yr old female admitted to the hospital with weakness, confusion, decreased appetite and peripheral edema. She was found to have symptomatic anemia, acute encephalopathy and sepsis due to PNA and UTI. PMH: CML, recent hospital admission due to anemia and sepsis, DM, eczema, glaucoma, HTN, MVP, chronic diastolic heart failure   OT comments  The pt was seem for functional strengthening, facilitation of ADL participation, and progression of functional activity. She required CGA to min assist for sit to stand using a RW, upper body grooming standing at the sink, and for short distance ambulation using a RW. She denied having pain, however reported feelings of generalized weakness and slight lethargy. Continue OT plan of care to maximize her independence with ADLs and to decrease the risk for restricted participation in other meaningful activities. Patient will benefit from continued inpatient follow up therapy, <3 hours/day.       If plan is discharge home, recommend the following:  A little help with walking and/or transfers;A little help with bathing/dressing/bathroom;Assist for transportation;Assistance with cooking/housework;Help with stairs or ramp for entrance;Direct supervision/assist for medications management   Equipment Recommendations  Tub/shower seat    Recommendations for Other Services      Precautions / Restrictions Precautions Precautions: Fall Restrictions Weight Bearing Restrictions Per Provider Order: No       Mobility Bed Mobility               General bed mobility comments: Patient was received seated in the bedside chair    Transfers Overall transfer level: Needs assistance Equipment used: Rolling walker (2 wheels) Transfers: Sit to/from Stand Sit to Stand: Min assist           Balance       Sitting balance - Comments: static sitting-good. dynamic sitting-fair+       Standing balance comment: CGA to min assist with a RW       ADL either performed or assessed with clinical judgement   ADL Overall ADL's : Needs assistance/impaired     Grooming: Contact guard assist;Standing Grooming Details (indicate cue type and reason): She required steadying assist to perform hand washing, face washing, and teeth brushing in standing at sink level. She was instructed to step closer to the sink to perform tasks. She presented with increased reliance on sink surface for added support.        Vision Baseline Vision/History: 1 Wears glasses           Communication Communication Communication: No apparent difficulties   Cognition Arousal: Alert Behavior During Therapy: WFL for tasks assessed/performed      Following commands: Intact Following commands impaired: Only follows one step commands consistently      Cueing   Cueing Techniques: Verbal cues, Gestural cues             Pertinent Vitals/ Pain       Pain Assessment Pain Assessment: No/denies pain   Frequency  Min 2X/week        Progress Toward Goals  OT Goals(current goals can now be found in the care plan section)  Progress towards OT goals: Progressing toward goals  Acute Rehab OT Goals OT Goal Formulation: With patient Time For Goal Achievement: 10/19/24 Potential to Achieve Goals: Good  Plan         AM-PAC OT 6 Clicks Daily Activity  Outcome Measure   Help from another person eating meals?: None Help from another person taking care of personal grooming?: A Little Help from another person toileting, which includes using toliet, bedpan, or urinal?: A Little Help from another person bathing (including washing, rinsing, drying)?: A Lot Help from another person to put on and taking off regular upper body clothing?: A Little Help from another person to put on and taking off  regular lower body clothing?: A Lot 6 Click Score: 17    End of Session Equipment Utilized During Treatment: Gait belt;Rolling walker (2 wheels)  OT Visit Diagnosis: Unsteadiness on feet (R26.81);Other abnormalities of gait and mobility (R26.89);Muscle weakness (generalized) (M62.81)   Activity Tolerance Patient tolerated treatment well   Patient Left in chair;with call bell/phone within reach;with chair alarm set;with family/visitor present;Other (comment) (Patient and family requesting to speak with charge nurse)   Nurse Communication Mobility status        Time: 1030-1050 OT Time Calculation (min): 20 min  Charges: OT General Charges $OT Visit: 1 Visit OT Treatments $Therapeutic Activity: 8-22 mins     Erica [name], OTR/L 10/07/2024, 12:37 PM

## 2024-10-08 ENCOUNTER — Inpatient Hospital Stay (HOSPITAL_COMMUNITY)

## 2024-10-08 DIAGNOSIS — G9340 Encephalopathy, unspecified: Secondary | ICD-10-CM | POA: Diagnosis not present

## 2024-10-08 DIAGNOSIS — R0609 Other forms of dyspnea: Secondary | ICD-10-CM | POA: Diagnosis not present

## 2024-10-08 LAB — GLUCOSE, CAPILLARY
Glucose-Capillary: 101 mg/dL — ABNORMAL HIGH (ref 70–99)
Glucose-Capillary: 128 mg/dL — ABNORMAL HIGH (ref 70–99)
Glucose-Capillary: 139 mg/dL — ABNORMAL HIGH (ref 70–99)
Glucose-Capillary: 124 mg/dL — ABNORMAL HIGH (ref 70–99)

## 2024-10-08 LAB — CULTURE, BLOOD (ROUTINE X 2)
Culture: NO GROWTH
Special Requests: ADEQUATE

## 2024-10-08 LAB — RESPIRATORY PANEL BY PCR

## 2024-10-08 LAB — ECHOCARDIOGRAM COMPLETE
Area-P 1/2: 4.77 cm2
Height: 59 in
Weight: 2567.92 [oz_av]

## 2024-10-08 NOTE — Plan of Care (Signed)
  Problem: Education: Goal: Ability to describe self-care measures that may prevent or decrease complications (Diabetes Survival Skills Education) will improve Outcome: Progressing Goal: Individualized Educational Video(s) Outcome: Progressing   Problem: Coping: Goal: Ability to adjust to condition or change in health will improve Outcome: Progressing   Problem: Fluid Volume: Goal: Ability to maintain a balanced intake and output will improve Outcome: Progressing   Problem: Health Behavior/Discharge Planning: Goal: Ability to identify and utilize available resources and services will improve Outcome: Progressing Goal: Ability to manage health-related needs will improve Outcome: Progressing   Problem: Nutritional: Goal: Maintenance of adequate nutrition will improve Outcome: Progressing Goal: Progress toward achieving an optimal weight will improve Outcome: Progressing   Problem: Skin Integrity: Goal: Risk for impaired skin integrity will decrease Outcome: Progressing   Problem: Tissue Perfusion: Goal: Adequacy of tissue perfusion will improve Outcome: Progressing

## 2024-10-08 NOTE — Progress Notes (Signed)
  Echocardiogram 2D Echocardiogram has been performed.  Erica [name] 10/08/2024, 11:30 AM

## 2024-10-08 NOTE — Progress Notes (Signed)
 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  Recent hospitalization with symptomatic anemia and sepsis treated with empiric antibiotics and blood transfusions came back to emergency room about 2 weeks from discharge again with profound generalized weakness, confusion, increased lower extremity edema and fall at home.  In the emergency room temperature 101.4 chest x-ray with right lower lobe effusion, UA abnormal.  Hemoglobin 6.5 with recent hemoglobin of 9.3 on 10/30.  Hemoccult negative.  TSH, B12 and ammonia negative.  CT head without significant findings.  Blood cultures were drawn.  Started on ceftriaxone and azithromycin.  MRI of the brain and lower extremity venous Dopplers were ordered.  Subjective:  Patient was seen and examined.  She was alert awake and interactive today.  She had some left-sided superficial flank pain otherwise denied any complaints.  She tells me her appetite is improving.  Remains afebrile over the last 24 hours. Met with son at the bedside and we discussed in detail.  He feels comfortable with taking his mother home tomorrow if remains stable with home health PT OT.  Will continue antibiotics today.  Echocardiogram pending.  If remains afebrile, will discuss with ID if we need any oral antibiotics to go home with.  Does not want to go to a SNF.   Assessment & Plan:   Symptomatic anemia with history of CML: 2 units of PRBC transfusion 2 weeks ago 2 units PRBC transfusion this admission. FOBT negative.  Hemoglobin responded appropriately at this time. No evidence of hemolysis. Seen by oncology, does not think related to CML or Ponatinib . Patient on Ponatinib , on hold for now. Patient follows up with oncology at [facility].  Will hold her medications until seen by her  oncologist Dr. Perri at [facility].  Sepsis due to pneumonia and possible UTI: Presented with fever, tachycardia.  Chest x-ray with hazy patchy opacity right lung base and small right pleural effusion concerning for pneumonia.  Blood cultures and urine cultures negative so far. On Rocephin azithromycin.  Currently on Rocephin. Blood cultures on admission, negative Blood cultures 11/13, negative Thoracentesis 11/14, 550 mL removed.  Exudate.  Nucleated cells 7400.  Cultures negative so far. Procalcitonin 0.9. ID following.   Acute encephalopathy: Likely multifactorial. 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  No focal deficits.  EEG negative.  B12, TSH and ammonia normal.  Encephalopathy improved.  Treating infection.  Abnormal brain MRI: As above.  Seen by neurology.  Recommended low-dose aspirin .  Generalized weakness: Continue to work with PT OT.  Recommended SNF.  Patient and family prefers to go home with home health.  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  Continue to hold until seen by oncology as outpatient.  Chronic diastolic heart failure: Euvolemic.  Repeat echocardiogram today.  Thrombocytopenia: Stable.  Hypotension: Blood pressure remains soft.  Started on midodrine  that we will continue.  TSH was normal.  Wound 10/05/24 0356 Pressure Injury Buttocks Right Stage 2 -  Partial thickness loss of dermis presenting as a shallow open injury with a red, pink wound bed without slough. (Active)         DVT prophylaxis: enoxaparin  (LOVENOX ) injection 40 mg Start: 10/03/24 2215   Code Status: Full code.  Seen by palliative care. Family Communication: Son at the bedside. Disposition Plan: Status is: Inpatient  Remains inpatient appropriate because: Patient is symptomatic.  IV antibiotics     Consultants:  Palliative care Infectious disease Oncology  Procedures:  None  Antimicrobials:  Rocephin azithromycin  11/10--     Objective: Vitals:   10/07/24 1200 10/07/24 1253 10/07/24 2044 10/08/24 0538  BP: 117/66 120/61 120/64 (!) 119/56  Pulse:  (!) 110 (!) 108 (!) 103  Resp:  18 16 16   Temp:  99.3 F (37.4 C) 98.9 F (37.2 C) 99 F (37.2 C)  TempSrc:  Oral Oral Oral  SpO2:  97% 95% 98%  Weight:      Height:        Intake/Output Summary (Last 24 hours) at 10/08/2024 1441 Last data filed at 10/08/2024 0600 Gross per 24 hour  Intake 180 ml  Output --  Net 180 ml   Filed Weights   10/04/24 1434  Weight: 72.8 kg    Examination:  General exam: Alert awake.  Looks comfortable and interactive.  Flat affect. Respiratory system: Clear to auscultation. Respiratory effort normal.  On room air. Cardiovascular system: S1 & S2 heard, RRR. No JVD, murmurs, rubs, gallops or clicks.  Gastrointestinal system: Abdomen is nondistended, soft and nontender. No organomegaly or masses felt. Normal bowel sounds heard. Central nervous system: Alert and oriented. No focal neurological deficits.  Gross generalized weakness. Extremities: Symmetric 5 x 5 power.  No edema.    Data Reviewed: I have personally reviewed following labs and imaging studies  CBC: Recent Labs  Lab 10/03/24 1502 10/04/24 0431 10/05/24 0426 10/06/24 0751 10/07/24 0408  WBC 4.7 4.2 3.9* 3.8* 3.5*  NEUTROABS  --   --   --   --  1.7  HGB 6.5* 7.8* 6.8* 9.2* 9.0*  HCT 19.2* 23.7* 20.5* 26.5* 26.3*  MCV 86.1 86.5 86.1 84.7 85.4  PLT 149* 126* 118* 103* 101*   Basic Metabolic Panel: Recent Labs  Lab 10/03/24 1502 10/04/24 0431 10/05/24 0426 10/06/24 0751 10/07/24 0408  NA 133* 135 136 135 133*  K 4.3 3.9 3.7 3.7 3.8  CL 95* 97* 100 98 97*  CO2 26 24 24  21* 22  GLUCOSE 153* 104* 131* 97 118*  BUN 13 11 10 10 13   CREATININE 0.67 0.62 0.62 0.57 0.57  CALCIUM 9.0 8.6* 8.3* 8.6* 8.4*  MG  --   --  2.1 2.1 2.0  PHOS  --   --   --  2.3*  --    GFR: Estimated Creatinine Clearance: 51.1 mL/min (by C-G formula based  on SCr of 0.57 mg/dL). Liver Function Tests: Recent Labs  Lab 10/03/24 1502 10/04/24 0431 10/05/24 0426 10/06/24 0751 10/07/24 0408  AST 37 35 28  --  29  ALT 9 7 7   --  7  ALKPHOS 85 80 65  --  62  BILITOT 0.7 0.6 0.5  --  0.6  PROT 6.4* 5.9* 5.5*  --  5.6*  ALBUMIN 3.3* 3.0* 2.9* 3.0* 2.8*   No results for input(s): LIPASE, AMYLASE in the last 168 hours. Recent Labs  Lab 10/03/24 1850  AMMONIA 14   Coagulation Profile: No results for input(s): INR, PROTIME in the last 168 hours. Cardiac Enzymes: Recent Labs  Lab 10/05/24 0426  CKTOTAL 31*   BNP (last 3 results) No results for input(s): PROBNP in the last 8760 hours. HbA1C: No results for input(s): HGBA1C in the last 72 hours.  CBG: Recent Labs  Lab 10/07/24 1111 10/07/24 1639 10/07/24 2042 10/08/24 0729 10/08/24 1148  GLUCAP 104* 153*  116* 101* 128*   Lipid Profile: No results for input(s): CHOL, HDL, LDLCALC, TRIG, CHOLHDL, LDLDIRECT in the last 72 hours.  Thyroid Function Tests: No results for input(s): TSH, T4TOTAL, FREET4, T3FREE, THYROIDAB in the last 72 hours.  Anemia Panel: No results for input(s): VITAMINB12, FOLATE, FERRITIN, TIBC, IRON, RETICCTPCT in the last 72 hours.  Sepsis Labs: Recent Labs  Lab 10/03/24 1805 10/07/24 0408  PROCALCITON  --  0.90  LATICACIDVEN 1.9  --     Recent Results (from the past 240 hours)  Blood Culture (routine x 2)     Status: None   Collection Time: 10/03/24  5:51 PM   Specimen: BLOOD  Result Value Ref Range Status   Specimen Description   Final    BLOOD LEFT ANTECUBITAL Performed at [facility], [address], [place], [place] [address]    Special Requests   Final    BOTTLES DRAWN AEROBIC AND ANAEROBIC Blood Culture adequate volume Performed at [facility], [address] [place], [place] [address]    Culture   Final    NO GROWTH 5 DAYS Performed at [facility], [address] [place], [place] [address]    Report Status 10/08/2024 FINAL  Final  Resp panel by RT-PCR (RSV, Flu A&B, Covid) Anterior Nasal Swab     Status: None   Collection Time: 10/03/24  6:50 PM   Specimen: Anterior Nasal Swab  Result Value Ref Range Status   SARS Coronavirus 2 by RT PCR NEGATIVE NEGATIVE Final    Comment: (NOTE) SARS-CoV-2 target nucleic acids are NOT DETECTED.  The SARS-CoV-2 RNA is generally detectable in upper respiratory specimens during the acute phase of infection. The lowest concentration of SARS-CoV-2 viral copies this assay can detect is 138 copies/mL. A negative result does not preclude SARS-Cov-2 infection and should not be used as the sole basis for treatment or other patient management decisions. A negative result may occur with  improper specimen collection/handling, submission of specimen other than nasopharyngeal swab, presence of viral mutation(s) within the areas targeted by this assay, and inadequate number of viral copies(<138 copies/mL). A negative result must be combined with clinical observations, patient history, and epidemiological information. The expected result is Negative.  Fact Sheet for Patients:  bloggercourse.com  Fact Sheet for Healthcare Providers:  seriousbroker.it  This test is no t yet approved or cleared by the [place]  FDA and  has been authorized for detection and/or diagnosis of SARS-CoV-2 by FDA under an Emergency Use Authorization (EUA). This EUA will remain  in effect (meaning this test can be used) for the duration of the COVID-19 declaration under Section 564(b)(1) of the Act, 21 U.S.C.section 360bbb-3(b)(1), unless the authorization is terminated  or revoked sooner.       Influenza A by PCR NEGATIVE NEGATIVE Final   Influenza B by PCR NEGATIVE NEGATIVE Final    Comment: (NOTE) The Xpert Xpress SARS-CoV-2/FLU/RSV plus assay is intended as an  aid in the diagnosis of influenza from Nasopharyngeal swab specimens and should not be used as a sole basis for treatment. Nasal washings and aspirates are unacceptable for Xpert Xpress SARS-CoV-2/FLU/RSV testing.  Fact Sheet for Patients: bloggercourse.com  Fact Sheet for Healthcare Providers: seriousbroker.it  This test is not yet approved or cleared by the [place]  FDA and has been authorized for detection and/or diagnosis of SARS-CoV-2 by FDA under an Emergency Use Authorization (EUA). This EUA will remain in effect (meaning this test can be  used) for the duration of the COVID-19 declaration under Section 564(b)(1) of the Act, 21 U.S.C. section 360bbb-3(b)(1), unless the authorization is terminated or revoked.     Resp Syncytial Virus by PCR NEGATIVE NEGATIVE Final    Comment: (NOTE) Fact Sheet for Patients: bloggercourse.com  Fact Sheet for Healthcare Providers: seriousbroker.it  This test is not yet approved or cleared by the [place]  FDA and has been authorized for detection and/or diagnosis of SARS-CoV-2 by FDA under an Emergency Use Authorization (EUA). This EUA will remain in effect (meaning this test can be used) for the duration of the COVID-19 declaration under Section 564(b)(1) of the Act, 21 U.S.C. section 360bbb-3(b)(1), unless the authorization is terminated or revoked.  Performed at [facility], [address], [place], [place] [address]   Urine Culture     Status: None   Collection Time: 10/03/24  8:55 PM   Specimen: Urine, Random  Result Value Ref Range Status   Specimen Description   Final    URINE, RANDOM Performed at [facility], [address], [place], [place] [address]    Special Requests   Final    NONE Reflexed from [phone] Performed at [facility], [address],  [place], [place] [address]    Culture   Final    NO GROWTH Performed at [facility], [address], [place], [place] [address]    Report Status 10/05/2024 FINAL  Final  Blood Culture (routine x 2)     Status: None (Preliminary result)   Collection Time: 10/04/24  4:31 AM   Specimen: BLOOD LEFT HAND  Result Value Ref Range Status   Specimen Description   Final    BLOOD LEFT HAND Performed at [facility], [address], [place], [place] [address]    Special Requests   Final    Blood Culture adequate volume BOTTLES DRAWN AEROBIC AND ANAEROBIC Performed at [facility], [address] [place], [place] [address]    Culture   Final    NO GROWTH 4 DAYS Performed at [facility], [address] [place], [place] [address]    Report Status PENDING  Incomplete  Culture, blood (Routine X 2) w Reflex to ID Panel     Status: None (Preliminary result)   Collection Time: 10/06/24  1:45 PM   Specimen: BLOOD LEFT ARM  Result Value Ref Range Status   Specimen Description   Final    BLOOD LEFT ARM Performed at [facility], [address] [place], [place] [address]    Special Requests   Final    BOTTLES DRAWN AEROBIC ONLY Blood Culture adequate volume Performed at [facility], [address] [place], [place] [address]    Culture   Final    NO GROWTH 2 DAYS Performed at [facility], [address], [place], [place] [address]    Report Status PENDING  Incomplete  Culture, blood (Routine X 2) w Reflex to ID Panel     Status: None (Preliminary result)   Collection Time: 10/06/24  1:56 PM   Specimen: BLOOD LEFT ARM  Result Value Ref Range Status   Specimen Description   Final    BLOOD LEFT ARM Performed at [facility], [address], [place], [place] [address]    Special Requests   Final    BOTTLES DRAWN AEROBIC AND ANAEROBIC Blood Culture adequate volume Performed at  [facility],  [address] [place], [place] [address]    Culture   Final    NO GROWTH 2 DAYS Performed at [facility], [address], [place], [place] [address]    Report Status PENDING  Incomplete  Body fluid culture w Gram Stain     Status: None (Preliminary result)   Collection Time: 10/07/24 12:24 PM   Specimen: PATH Cytology Pleural fluid  Result Value Ref Range Status   Specimen Description   Final    PLEURAL Performed at [facility], [address] [place], [place] [address]    Special Requests   Final    NONE Performed at [facility], [address] [place], [place] [address]    Gram Stain   Final    FEW WBC PRESENT, PREDOMINANTLY MONONUCLEAR NO ORGANISMS SEEN    Culture   Final    NO GROWTH < 24 HOURS Performed at [facility], [address] [place], [place] [address]    Report Status PENDING  Incomplete  Respiratory (~20 pathogens) panel by PCR     Status: None   Collection Time: 10/07/24 11:59 PM   Specimen: Nasopharyngeal Swab; Respiratory  Result Value Ref Range Status   Adenovirus NOT DETECTED NOT DETECTED Final   Coronavirus 229E NOT DETECTED NOT DETECTED Final    Comment: (NOTE) The Coronavirus on the Respiratory Panel, DOES NOT test for the novel  Coronavirus (2019 nCoV)    Coronavirus HKU1 NOT DETECTED NOT DETECTED Final   Coronavirus NL63 NOT DETECTED NOT DETECTED Final   Coronavirus OC43 NOT DETECTED NOT DETECTED Final   Metapneumovirus NOT DETECTED NOT DETECTED Final   Rhinovirus / Enterovirus NOT DETECTED NOT DETECTED Final   Influenza A NOT DETECTED NOT DETECTED Final   Influenza B NOT DETECTED NOT DETECTED Final   Parainfluenza Virus 1 NOT DETECTED NOT DETECTED Final   Parainfluenza Virus 2 NOT DETECTED NOT DETECTED Final   Parainfluenza Virus 3 NOT DETECTED NOT DETECTED Final   Parainfluenza Virus 4 NOT DETECTED NOT DETECTED Final   Respiratory Syncytial Virus NOT DETECTED NOT DETECTED Final    Bordetella pertussis NOT DETECTED NOT DETECTED Final   Bordetella Parapertussis NOT DETECTED NOT DETECTED Final   Chlamydophila pneumoniae NOT DETECTED NOT DETECTED Final   Mycoplasma pneumoniae NOT DETECTED NOT DETECTED Final    Comment: Performed at [facility], [address] [place], [place] [address]         Radiology Studies: US  THORACENTESIS ASP PLEURAL SPACE W/IMG GUIDE Result Date: 10/07/2024 INDICATION: [id number] Pleural effusion on right [id number] Inpatient being treated for pneumonia with bilateral pleural effusion. Team requesting right diagnostic and therapeutic thoracentesis. EXAM: ULTRASOUND GUIDED RIGHT THORACENTESIS MEDICATIONS: 8 mL 1% lidocaine  COMPLICATIONS: None immediate. PROCEDURE: An ultrasound guided thoracentesis was thoroughly discussed with the patient and questions answered. The benefits, risks, alternatives and complications were also discussed. The patient understands and wishes to proceed with the procedure. Written consent was obtained. Ultrasound was performed to localize and mark an adequate pocket of fluid in the right chest. The area was then prepped and draped in the normal sterile fashion. 1% Lidocaine  was used for local anesthesia. Under ultrasound guidance a 19 gauge, 7-cm, Yueh catheter was introduced with patient in lateral decubitus position. Thoracentesis was performed. The catheter was removed and a dressing applied. FINDINGS: A total of approximately 550 mL of clear, yellow fluid was removed. Samples were sent to the laboratory as  requested by the clinical team. IMPRESSION: Successful ultrasound guided RIGHT thoracentesis yielding 550 mL of pleural fluid. Performed by Laymon Coast, NP under the supervision of Thom Hall, MD Electronically Signed   By: Thom Hall M.D.   On: 10/07/2024 15:55   DG Chest Port 1 View Result Date: 10/07/2024 CLINICAL DATA:  Post thoracentesis. EXAM: PORTABLE CHEST 1 VIEW COMPARISON:  Radiographs 10/06/2024 and  10/03/2024.  CT 09/15/2024. FINDINGS: 1226 hours. The heart size and mediastinal contours are stable. Interval decreased right pleural effusion with improved aeration of the right lung base. No evidence of pneumothorax. Probable residual small bilateral pleural effusions with left greater than right basilar airspace opacities. The bones appear unchanged. IMPRESSION: Interval decreased right pleural effusion with improved aeration of the right lung base. No evidence of pneumothorax. Electronically Signed   By: Elsie Perone M.D.   On: 10/07/2024 12:58        Scheduled Meds:  (feeding supplement) PROSource Plus  30 mL Oral BID BM   aspirin  EC  81 mg Oral Daily   enoxaparin  (LOVENOX ) injection  40 mg Subcutaneous Q24H   feeding supplement  1 Container Oral BID BM   folic acid  1 mg Oral Daily   insulin  aspart  0-6 Units Subcutaneous TID WC   latanoprost   1 drop Both Eyes QHS   midodrine   5 mg Oral TID WC   multivitamin with minerals  1 tablet Oral Daily   Continuous Infusions:  cefTRIAXone (ROCEPHIN)  IV 1 g (10/08/24 0926)     LOS: 5 days       Renato Applebaum, MD [facility]

## 2024-10-08 NOTE — Consult Note (Signed)
 Consultation Note Date: 10/08/2024   Patient Name: Erica [name]  DOB: 1946/06/01  MRN: [id number]  Age / Sex: 78 y.o., female  PCP: Erica Elsie SAUNDERS, MD Referring Physician: Raenelle Coria, MD  Reason for Consultation: Establishing goals of care  HPI/Patient Profile: 78 y.o. 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 Recent hospitalization with symptomatic anemia and sepsis treated with empiric antibiotics and blood transfusions came back to emergency room about 2 weeks from discharge again with profound generalized weakness, confusion, increased lower extremity edema and fall at home.   Patient admitted to hospital medicine service with symptomatic anemia with history of CML, sepsis due to PNA and possible UTI. MRI brain was also done for acute encephalopathy with punctate left parotic corpus callosum diffusion abnormality. Patient also underwent thoracentesis on 10-07-24 with 550 ml removed. Ponatinib  is being held, until she can follow up again with her usual oncologist.   Patient has been seen by oncology, PT OT also consulted. PMT for additional goals of care.   Palliative medicine is specialized medical care for people living with serious illness. It focuses on providing relief from the symptoms and stress of a serious illness. The goal is to improve quality of life for both the patient and the family.  Goals of care: Broad aims of medical therapy in relation to the patient's values and preferences. Our aim is to provide medical care aimed at enabling patients to achieve the goals that matter most to them, given the circumstances of their particular medical situation and their constraints.    HCPOA  Son Erica [name] present at bedside Lives at home, niece  lives locally.   SUMMARY OF RECOMMENDATIONS    Code status and goals of care discussions undertaken, with patient and HCPOA son Erica [name] present at bedside, son has arrived from [place]. We discussed about the patient's current condition and her underlying illness. Goals, wishes and values attempted to be explored.  Plan: Full code full scope care Home health and home based palliative care on discharge.  Thank you for the consult.   Code Status/Advance Care Planning: Full code   Symptom Management:     Palliative Prophylaxis:  Frequent Pain Assessment  Additional Recommendations (Limitations, Scope, Preferences): Full Scope Treatment  Psycho-social/Spiritual:  Desire for further Chaplaincy support:yes Additional Recommendations: Caregiving  Support/Resources  Prognosis:  Unable to determine  Discharge Planning: To Be Determined      Primary Diagnoses: Present on Admission:  Acute encephalopathy  Symptomatic anemia  Chronic myeloid leukemia (HCC)  Essential hypertension  Pleural effusion   I have reviewed the medical record, interviewed the patient and family, and examined the patient. The following aspects are pertinent.  Past Medical History:  Diagnosis Date   Bilateral swelling of feet    Cancer (HCC)    Cataract    Diabetes mellitus without complication (HCC)    diet controlled   Eczema  Elevated cholesterol    GERD (gastroesophageal reflux disease)    Glaucoma    Hypertension    Mitral valve prolapse    Simple endometrial hyperplasia without atypia 07/2016   In endometrial polyp with surrounding endometrium inactive recommend follow up ultrasound for endometrial echo 1 year   Spondylisthesis    Social History   Socioeconomic History   Marital status: Divorced    Spouse name: Not on file   Number of children: Not on file   Years of education: Not on file   Highest education level: Not on file  Occupational History   Not on file  Tobacco Use    Smoking status: Never   Smokeless tobacco: Never  Vaping Use   Vaping status: Never Used  Substance and Sexual Activity   Alcohol use: No    Alcohol/week: 0.0 standard drinks of alcohol   Drug use: No   Sexual activity: Not Currently    Birth control/protection: Post-menopausal    Comment: 1st intercourse 78 yo-Fewer than 5 partners  Other Topics Concern   Not on file  Social History Narrative   Not on file   Social Drivers of Health   Financial Resource Strain: Not on file  Food Insecurity: Patient Unable To Answer (10/04/2024)   Hunger Vital Sign    Worried About Running Out of Food in the Last Year: Patient unable to answer    Ran Out of Food in the Last Year: Patient unable to answer  Transportation Needs: No Transportation Needs (10/04/2024)   PRAPARE - Administrator, Civil Service (Medical): No    Lack of Transportation (Non-Medical): No  Physical Activity: Not on file  Stress: Not on file  Social Connections: Unknown (10/04/2024)   Social Connection and Isolation Panel    Frequency of Communication with Friends and Family: Patient unable to answer    Frequency of Social Gatherings with Friends and Family: Once a week    Attends Religious Services: Patient unable to answer    Active Member of Clubs or Organizations: Patient unable to answer    Attends Banker Meetings: Patient unable to answer    Marital Status: Patient unable to answer   Family History  Problem Relation Age of Onset   Heart disease Mother    Heart disease Father    Breast cancer Sister 110   Scheduled Meds:  (feeding supplement) PROSource Plus  30 mL Oral BID BM   aspirin  EC  81 mg Oral Daily   enoxaparin  (LOVENOX ) injection  40 mg Subcutaneous Q24H   feeding supplement  1 Container Oral BID BM   folic acid  1 mg Oral Daily   insulin  aspart  0-6 Units Subcutaneous TID WC   latanoprost   1 drop Both Eyes QHS   midodrine   5 mg Oral TID WC   multivitamin with minerals   1 tablet Oral Daily   Continuous Infusions:  cefTRIAXone (ROCEPHIN)  IV 1 g (10/08/24 0926)   PRN Meds:.acetaminophen  **OR** acetaminophen , ondansetron  (ZOFRAN ) IV Medications Prior to Admission:  Prior to Admission medications   Medication Sig Start Date End Date Taking? Authorizing Provider  folic acid (FOLVITE) 1 MG tablet Take 1 tablet (1 mg total) by mouth daily. 09/22/24  Yes Erica Coria, MD  furosemide  (LASIX ) 20 MG tablet Take 20 mg by mouth daily. 09/06/20  Yes [provider]  ibuprofen (ADVIL) 800 MG tablet Take 800 mg by mouth every 6 (six) hours as needed.   Yes [provider]  latanoprost  (XALATAN ) 0.005 % ophthalmic solution Place 1 drop into both eyes at bedtime. BRAND NAME   Yes [provider]  loratadine (CLARITIN) 10 MG tablet Take 10 mg by mouth daily as needed for allergies.   Yes [provider]  PONATinib  HCl (ICLUSIG ) 45 MG tablet Take 45 mg by mouth daily. 12/29/23 12/28/24 Yes [provider]  propranolol  (INDERAL ) 10 MG tablet Take 10 mg by mouth daily as needed (palpitations).   Yes [provider]  traMADol  (ULTRAM ) 50 MG tablet Take 50 mg by mouth every 6 (six) hours as needed for moderate pain.    Yes [provider]   Allergies  Allergen Reactions   Penicillins Hives   Sulfa Antibiotics Hives   Review of Systems +weakness  Physical Exam Awake alert Complains of constipation Regular work of breathing Abdomen not tender No distress Appears with generalized weakness  Vital Signs: BP (!) 119/56 (BP Location: Left Arm)   Pulse (!) 103   Temp 99 F (37.2 C) (Oral)   Resp 16   Ht 4' 11 (1.499 m)   Wt 72.8 kg   SpO2 98%   BMI 32.42 kg/m  Pain Scale: 0-10   Pain Score: Asleep   SpO2: SpO2: 98 % O2 Device:SpO2: 98 % O2 Flow Rate: .   IO: Intake/output summary:  Intake/Output Summary (Last 24 hours) at 10/08/2024 1348 Last data filed at 10/08/2024 0600 Gross per 24 hour   Intake 180 ml  Output --  Net 180 ml    LBM: Last BM Date : 10/05/24 Baseline Weight: Weight: 72.8 kg Most recent weight: Weight: 72.8 kg     Palliative Assessment/Data:   PPS 50%  Time In:  12 Time Out:  1315 Time Total:  75 Greater than 50%  of this time was spent counseling and coordinating care related to the above assessment and plan.  Signed by: Lonia Serve, MD   Please contact Palliative Medicine Team phone at [phone] for questions and concerns.  For individual provider: See Tracey

## 2024-10-09 ENCOUNTER — Inpatient Hospital Stay (HOSPITAL_COMMUNITY)

## 2024-10-09 DIAGNOSIS — D61818 Other pancytopenia: Secondary | ICD-10-CM | POA: Diagnosis not present

## 2024-10-09 DIAGNOSIS — R509 Fever, unspecified: Secondary | ICD-10-CM | POA: Diagnosis not present

## 2024-10-09 DIAGNOSIS — G9340 Encephalopathy, unspecified: Secondary | ICD-10-CM | POA: Diagnosis not present

## 2024-10-09 DIAGNOSIS — D649 Anemia, unspecified: Secondary | ICD-10-CM | POA: Diagnosis not present

## 2024-10-09 LAB — GLUCOSE, CAPILLARY
Glucose-Capillary: 134 mg/dL — ABNORMAL HIGH (ref 70–99)
Glucose-Capillary: 158 mg/dL — ABNORMAL HIGH (ref 70–99)
Glucose-Capillary: 158 mg/dL — ABNORMAL HIGH (ref 70–99)
Glucose-Capillary: 154 mg/dL — ABNORMAL HIGH (ref 70–99)

## 2024-10-09 LAB — CULTURE, BLOOD (ROUTINE X 2)
Culture: NO GROWTH
Special Requests: ADEQUATE

## 2024-10-09 NOTE — Progress Notes (Signed)
 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  Recent hospitalization with symptomatic anemia and sepsis treated with empiric antibiotics and blood transfusions came back to emergency room about 2 weeks from discharge again with profound generalized weakness, confusion, increased lower extremity edema and fall at home.  In the emergency room temperature 101.4 chest x-ray with right lower lobe effusion, UA abnormal.  Hemoglobin 6.5 with recent hemoglobin of 9.3 on 10/30.  Hemoccult negative.  TSH, B12 and ammonia negative.  CT head without significant findings.  Blood cultures were drawn.  Started on ceftriaxone and azithromycin.  MRI of the brain and lower extremity venous Dopplers were ordered.  Subjective:  Patient seen and examined.  She responds pleasantly but looks very frail and debilitated.  Does not keep up conversation.  Son at the bedside. She does not have any appetite.  Yesterday she told me her appetite is coming back, today she tells me that she does not like the food. So far remains afebrile.  Remains on Rocephin.  Discussed with her son at the bedside that all the infection workup were negative but are continuing antibiotics today.  She has also developed some confusion which is likely delirium. If we can improve her appetite some today, she should transition home with home health PT OT tomorrow.  Son is agreeable. I also suggested him to have frank conversation with his mom about goal of care including any limitation care, desire for feeding tube etc. in the future if needed.   Assessment & Plan:   Symptomatic anemia with history of CML: 2 units of PRBC transfusion 2 weeks ago 2 units PRBC transfusion this admission. FOBT negative.  Hemoglobin responded appropriately at this time. No  evidence of hemolysis. Seen by oncology, does not think related to CML or Ponatinib . Patient on Ponatinib , on hold for now. Patient follows up with oncology at [facility]  Will hold her medications until seen by her oncologist Dr. Perri at [facility]. Recheck hemoglobin tomorrow morning.  Sepsis due to pneumonia and possible UTI: Presented with fever, tachycardia.  Chest x-ray with hazy patchy opacity right lung base and small right pleural effusion concerning for pneumonia.  Blood cultures and urine cultures negative so far. On Rocephin azithromycin.  Currently on Rocephin. Blood cultures on admission, negative Blood cultures 11/13, negative Thoracentesis 11/14, 550 mL removed.  Exudate.  Nucleated cells 7400.  Cultures negative so far. Procalcitonin 0.9. ID following.  Will continue Rocephin today.   Acute encephalopathy: Likely multifactorial.  Some delirium. 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  No focal deficits.  EEG negative.  B12, TSH and ammonia normal.  Encephalopathy improved.  Treating infection.  Abnormal brain MRI: As above.  Seen by neurology.  Recommended low-dose aspirin .  Generalized weakness: Continue to work with PT OT.  Recommended SNF.  Patient and family prefers to go home with home health.  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  Continue to hold until seen by oncology as outpatient.  Chronic diastolic heart failure: Euvolemic.  Repeat echocardiogram with normal ejection fraction.  Avoiding diuretics now.  Thrombocytopenia: Stable.  Recheck tomorrow.  Hypotension: Blood pressure remains soft.  Started on midodrine  that we will continue.  TSH was normal.  Wound 10/05/24 0356 Pressure Injury Buttocks Right Stage 2 -  Partial  thickness loss of dermis presenting as a shallow open injury with a red, pink wound bed without slough. (Active)   Goal of care: Full code.  Palliative following.     DVT  prophylaxis: enoxaparin  (LOVENOX ) injection 40 mg Start: 10/03/24 2215   Code Status: Full code.  Seen by palliative care. Family Communication: Son at the bedside.  Updated in details. Disposition Plan: Status is: Inpatient Remains inpatient appropriate because: Patient is not eating adequately.  IV antibiotics     Consultants:  Palliative care Infectious disease Oncology  Procedures:  None  Antimicrobials:  Rocephin azithromycin 11/10--     Objective: Vitals:   10/08/24 1300 10/08/24 2124 10/09/24 0150 10/09/24 0628  BP: 120/76 135/63 (!) 124/59 (!) 117/55  Pulse: (!) 110  (!) 106 (!) 109  Resp: 18 (!) 22 20 16   Temp: 98 F (36.7 C) 99.8 F (37.7 C) 99 F (37.2 C) 98.6 F (37 C)  TempSrc: Oral Oral  Oral  SpO2: 99% 97% 96% 95%  Weight:      Height:        Intake/Output Summary (Last 24 hours) at 10/09/2024 1151 Last data filed at 10/09/2024 1054 Gross per 24 hour  Intake 170 ml  Output --  Net 170 ml   Filed Weights   10/04/24 1434  Weight: 72.8 kg    Examination:  General exam: Alert and awake.  Flat affect.  Pleasant to interaction, however not interested to talk today.  Sick looking.  Frail. Respiratory system: Clear to auscultation. Respiratory effort normal.  On room air. Cardiovascular system: S1 & S2 heard, RRR. No JVD, murmurs, rubs, gallops or clicks.  Gastrointestinal system: Abdomen is nondistended, soft and nontender. No organomegaly or masses felt. Normal bowel sounds heard. Central nervous system: Alert and oriented. No focal neurological deficits.  Gross generalized weakness.    Data Reviewed: I have personally reviewed following labs and imaging studies  CBC: Recent Labs  Lab 10/03/24 1502 10/04/24 0431 10/05/24 0426 10/06/24 0751 10/07/24 0408  WBC 4.7 4.2 3.9* 3.8* 3.5*  NEUTROABS  --   --   --   --  1.7  HGB 6.5* 7.8* 6.8* 9.2* 9.0*  HCT 19.2* 23.7* 20.5* 26.5* 26.3*  MCV 86.1 86.5 86.1 84.7 85.4  PLT 149* 126* 118*  103* 101*   Basic Metabolic Panel: Recent Labs  Lab 10/03/24 1502 10/04/24 0431 10/05/24 0426 10/06/24 0751 10/07/24 0408  NA 133* 135 136 135 133*  K 4.3 3.9 3.7 3.7 3.8  CL 95* 97* 100 98 97*  CO2 26 24 24  21* 22  GLUCOSE 153* 104* 131* 97 118*  BUN 13 11 10 10 13   CREATININE 0.67 0.62 0.62 0.57 0.57  CALCIUM 9.0 8.6* 8.3* 8.6* 8.4*  MG  --   --  2.1 2.1 2.0  PHOS  --   --   --  2.3*  --    GFR: Estimated Creatinine Clearance: 51.1 mL/min (by C-G formula based on SCr of 0.57 mg/dL). Liver Function Tests: Recent Labs  Lab 10/03/24 1502 10/04/24 0431 10/05/24 0426 10/06/24 0751 10/07/24 0408  AST 37 35 28  --  29  ALT 9 7 7   --  7  ALKPHOS 85 80 65  --  62  BILITOT 0.7 0.6 0.5  --  0.6  PROT 6.4* 5.9* 5.5*  --  5.6*  ALBUMIN 3.3* 3.0* 2.9* 3.0* 2.8*   No results for input(s): LIPASE, AMYLASE in the last 168 hours. Recent Labs  Lab  10/03/24 1850  AMMONIA 14   Coagulation Profile: No results for input(s): INR, PROTIME in the last 168 hours. Cardiac Enzymes: Recent Labs  Lab 10/05/24 0426  CKTOTAL 31*   BNP (last 3 results) No results for input(s): PROBNP in the last 8760 hours. HbA1C: No results for input(s): HGBA1C in the last 72 hours.  CBG: Recent Labs  Lab 10/08/24 1148 10/08/24 1656 10/08/24 2154 10/09/24 0727 10/09/24 1138  GLUCAP 128* 139* 124* 134* 158*   Lipid Profile: No results for input(s): CHOL, HDL, LDLCALC, TRIG, CHOLHDL, LDLDIRECT in the last 72 hours.  Thyroid Function Tests: No results for input(s): TSH, T4TOTAL, FREET4, T3FREE, THYROIDAB in the last 72 hours.  Anemia Panel: No results for input(s): VITAMINB12, FOLATE, FERRITIN, TIBC, IRON, RETICCTPCT in the last 72 hours.  Sepsis Labs: Recent Labs  Lab 10/03/24 1805 10/07/24 0408  PROCALCITON  --  0.90  LATICACIDVEN 1.9  --     Recent Results (from the past 240 hours)  Blood Culture (routine x 2)     Status: None    Collection Time: 10/03/24  5:51 PM   Specimen: BLOOD  Result Value Ref Range Status   Specimen Description   Final    BLOOD LEFT ANTECUBITAL Performed at [facility], [address], [place], [place] [address]    Special Requests   Final    BOTTLES DRAWN AEROBIC AND ANAEROBIC Blood Culture adequate volume Performed at [facility], [address], [place], [place] [address]    Culture   Final    NO GROWTH 5 DAYS Performed at [facility], [address], [place], [place] [address]    Report Status 10/08/2024 FINAL  Final  Resp panel by RT-PCR (RSV, Flu A&B, Covid) Anterior Nasal Swab     Status: None   Collection Time: 10/03/24  6:50 PM   Specimen: Anterior Nasal Swab  Result Value Ref Range Status   SARS Coronavirus 2 by RT PCR NEGATIVE NEGATIVE Final    Comment: (NOTE) SARS-CoV-2 target nucleic acids are NOT DETECTED.  The SARS-CoV-2 RNA is generally detectable in upper respiratory specimens during the acute phase of infection. The lowest concentration of SARS-CoV-2 viral copies this assay can detect is 138 copies/mL. A negative result does not preclude SARS-Cov-2 infection and should not be used as the sole basis for treatment or other patient management decisions. A negative result may occur with  improper specimen collection/handling, submission of specimen other than nasopharyngeal swab, presence of viral mutation(s) within the areas targeted by this assay, and inadequate number of viral copies(<138 copies/mL). A negative result must be combined with clinical observations, patient history, and epidemiological information. The expected result is Negative.  Fact Sheet for Patients:  bloggercourse.com  Fact Sheet for Healthcare Providers:  seriousbroker.it  This test is no t yet approved or cleared by the [place]  FDA and  has been authorized for detection and/or diagnosis  of SARS-CoV-2 by FDA under an Emergency Use Authorization (EUA). This EUA will remain  in effect (meaning this test can be used) for the duration of the COVID-19 declaration under Section 564(b)(1) of the Act, 21 U.S.C.section 360bbb-3(b)(1), unless the authorization is terminated  or revoked sooner.       Influenza A by PCR NEGATIVE NEGATIVE Final   Influenza B by PCR NEGATIVE NEGATIVE Final    Comment: (NOTE) The Xpert Xpress SARS-CoV-2/FLU/RSV plus assay is intended as an aid in the diagnosis of influenza from Nasopharyngeal swab specimens  and should not be used as a sole basis for treatment. Nasal washings and aspirates are unacceptable for Xpert Xpress SARS-CoV-2/FLU/RSV testing.  Fact Sheet for Patients: bloggercourse.com  Fact Sheet for Healthcare Providers: seriousbroker.it  This test is not yet approved or cleared by the [place]  FDA and has been authorized for detection and/or diagnosis of SARS-CoV-2 by FDA under an Emergency Use Authorization (EUA). This EUA will remain in effect (meaning this test can be used) for the duration of the COVID-19 declaration under Section 564(b)(1) of the Act, 21 U.S.C. section 360bbb-3(b)(1), unless the authorization is terminated or revoked.     Resp Syncytial Virus by PCR NEGATIVE NEGATIVE Final    Comment: (NOTE) Fact Sheet for Patients: bloggercourse.com  Fact Sheet for Healthcare Providers: seriousbroker.it  This test is not yet approved or cleared by the [place]  FDA and has been authorized for detection and/or diagnosis of SARS-CoV-2 by FDA under an Emergency Use Authorization (EUA). This EUA will remain in effect (meaning this test can be used) for the duration of the COVID-19 declaration under Section 564(b)(1) of the Act, 21 U.S.C. section 360bbb-3(b)(1), unless the authorization is terminated  or revoked.  Performed at [facility], [address], [place], [place] [address]   Urine Culture     Status: None   Collection Time: 10/03/24  8:55 PM   Specimen: Urine, Random  Result Value Ref Range Status   Specimen Description   Final    URINE, RANDOM Performed at [facility], [address], [place], [place] [address]    Special Requests   Final    NONE Reflexed from [id number] Performed at [facility], [address] [place], [place] [address]    Culture   Final    NO GROWTH Performed at [facility], [address] [place], [place] [address]    Report Status 10/05/2024 FINAL  Final  Blood Culture (routine x 2)     Status: None   Collection Time: 10/04/24  4:31 AM   Specimen: BLOOD LEFT HAND  Result Value Ref Range Status   Specimen Description   Final    BLOOD LEFT HAND Performed at [facility], [address] [place], [place] [address]    Special Requests   Final    Blood Culture adequate volume BOTTLES DRAWN AEROBIC AND ANAEROBIC Performed at [facility], [address] [place], [place] [address]    Culture   Final    NO GROWTH 5 DAYS Performed at [facility], [address], [place], [place] [address]    Report Status 10/09/2024 FINAL  Final  Culture, blood (Routine X 2) w Reflex to ID Panel     Status: None (Preliminary result)   Collection Time: 10/06/24  1:45 PM   Specimen: BLOOD LEFT ARM  Result Value Ref Range Status   Specimen Description   Final    BLOOD LEFT ARM Performed at [facility], [address], [place], [place] [address]    Special Requests   Final    BOTTLES DRAWN AEROBIC ONLY Blood Culture adequate volume Performed at [facility], [address] [place], [place] [address]    Culture   Final    NO GROWTH 3 DAYS Performed at [facility], [address], [place], [place] [address]     Report Status PENDING  Incomplete  Culture, blood (Routine X 2) w Reflex to  ID Panel     Status: None (Preliminary result)   Collection Time: 10/06/24  1:56 PM   Specimen: BLOOD LEFT ARM  Result Value Ref Range Status   Specimen Description   Final    BLOOD LEFT ARM Performed at [facility], [address] [place], [place] [address]    Special Requests   Final    BOTTLES DRAWN AEROBIC AND ANAEROBIC Blood Culture adequate volume Performed at [facility], [address] [place], [place] [address]    Culture   Final    NO GROWTH 3 DAYS Performed at [facility], [address] [place], [place] [address]    Report Status PENDING  Incomplete  Body fluid culture w Gram Stain     Status: None (Preliminary result)   Collection Time: 10/07/24 12:24 PM   Specimen: PATH Cytology Pleural fluid  Result Value Ref Range Status   Specimen Description   Final    PLEURAL Performed at [facility], [address] [place], [place] [address]    Special Requests   Final    NONE Performed at [facility], [address] [place], [place] [address]    Gram Stain   Final    FEW WBC PRESENT, PREDOMINANTLY MONONUCLEAR NO ORGANISMS SEEN    Culture   Final    NO GROWTH < 24 HOURS Performed at [facility], [address], [place], [place] [address]    Report Status PENDING  Incomplete  Respiratory (~20 pathogens) panel by PCR     Status: None   Collection Time: 10/07/24 11:59 PM   Specimen: Nasopharyngeal Swab; Respiratory  Result Value Ref Range Status   Adenovirus NOT DETECTED NOT DETECTED Final   Coronavirus 229E NOT DETECTED NOT DETECTED Final    Comment: (NOTE) The Coronavirus on the Respiratory Panel, DOES NOT test for the novel  Coronavirus (2019 nCoV)    Coronavirus HKU1 NOT DETECTED NOT DETECTED Final   Coronavirus NL63 NOT DETECTED NOT DETECTED Final   Coronavirus OC43 NOT DETECTED NOT DETECTED Final    Metapneumovirus NOT DETECTED NOT DETECTED Final   Rhinovirus / Enterovirus NOT DETECTED NOT DETECTED Final   Influenza A NOT DETECTED NOT DETECTED Final   Influenza B NOT DETECTED NOT DETECTED Final   Parainfluenza Virus 1 NOT DETECTED NOT DETECTED Final   Parainfluenza Virus 2 NOT DETECTED NOT DETECTED Final   Parainfluenza Virus 3 NOT DETECTED NOT DETECTED Final   Parainfluenza Virus 4 NOT DETECTED NOT DETECTED Final   Respiratory Syncytial Virus NOT DETECTED NOT DETECTED Final   Bordetella pertussis NOT DETECTED NOT DETECTED Final   Bordetella Parapertussis NOT DETECTED NOT DETECTED Final   Chlamydophila pneumoniae NOT DETECTED NOT DETECTED Final   Mycoplasma pneumoniae NOT DETECTED NOT DETECTED Final    Comment: Performed at [facility], [address], [place], [place] [address]         Radiology Studies: ECHOCARDIOGRAM COMPLETE Result Date: 10/08/2024    ECHOCARDIOGRAM REPORT   Patient Name:   ZYKERIA LAGUARDIA Date of Exam: 10/08/2024 Medical Rec #:  [id number]        Height:       59.0 in Accession #:    [id number]       Weight:       160.5 lb Date of Birth:  1946-11-22       BSA:          1.680 m Patient  Age:    77 years         BP:           119/56 mmHg Patient Gender: F                HR:           109 bpm. Exam Location:  Inpatient Procedure: 2D Echo (Both Spectral and Color Flow Doppler were utilized during            procedure). Indications:    Dyspnea  History:        Patient has prior history of Echocardiogram examinations. CHF;                 Risk Factors:Hypertension.  Sonographer:    Charmaine Gaskins Referring Phys: [id number] RUBENS C CHANG IMPRESSIONS  1. Left ventricular ejection fraction, by estimation, is 70 to 75%. The left ventricle has hyperdynamic function. Mild left ventricular hypertrophy. Left ventricular diastolic parameters are indeterminate. Intracavitary gradient measures at rest  2. Right ventricular systolic function is normal. The right  ventricular size is normal. There is moderately elevated pulmonary artery systolic pressure. The estimated right ventricular systolic pressure is 49.8 mmHg.  3. Moderate pericardial effusion. There is no evidence of cardiac tamponade.  4. The mitral valve is normal in structure. Trivial mitral valve regurgitation.  5. Tricuspid valve regurgitation is mild to moderate.  6. The aortic valve is tricuspid. Aortic valve regurgitation is not visualized. Aortic valve sclerosis/calcification is present, without any evidence of aortic stenosis.  7. The inferior vena cava is normal in size with greater than 50% respiratory variability, suggesting right atrial pressure of 3 mmHg. FINDINGS  Left Ventricle: Left ventricular ejection fraction, by estimation, is 70 to 75%. The left ventricle has hyperdynamic function. The left ventricle has no regional wall motion abnormalities. The left ventricular internal cavity size was normal in size. There is mild left ventricular hypertrophy. Left ventricular diastolic parameters are indeterminate. Right Ventricle: The right ventricular size is normal. No increase in right ventricular wall thickness. Right ventricular systolic function is normal. There is moderately elevated pulmonary artery systolic pressure. The tricuspid regurgitant velocity is 3.42 m/s, and with an assumed right atrial pressure of 3 mmHg, the estimated right ventricular systolic pressure is 49.8 mmHg. Left [facility]: Left atrial size was normal in size. Right [facility]: Right atrial size was normal in size. Pericardium: A moderately sized pericardial effusion is present. There is no evidence of cardiac tamponade. Mitral Valve: The mitral valve is normal in structure. Trivial mitral valve regurgitation. Tricuspid Valve: The tricuspid valve is normal in structure. Tricuspid valve regurgitation is mild to moderate. Aortic Valve: The aortic valve is tricuspid. Aortic valve regurgitation is not visualized. Aortic valve  sclerosis/calcification is present, without any evidence of aortic stenosis. Pulmonic Valve: The pulmonic valve was normal in structure. Pulmonic valve regurgitation is trivial. Aorta: The aortic root and ascending aorta are structurally normal, with no evidence of dilitation. Venous: The inferior vena cava is normal in size with greater than 50% respiratory variability, suggesting right atrial pressure of 3 mmHg. IAS/Shunts: The interatrial septum was not well visualized.  LEFT VENTRICLE PLAX 2D LVOT diam:     2.10 cm   Diastology LVOT Area:     3.46 cm  LV e' medial:    10.80 cm/s                          LV E/e' medial:  7.2                          LV e' lateral:   7.29 cm/s                          LV E/e' lateral: 10.7  RIGHT VENTRICLE RV Basal diam:  2.40 cm RV Mid diam:    2.50 cm RV S prime:     17.70 cm/s LEFT ATRIUM             Index        RIGHT ATRIUM           Index LA Vol (A2C):   51.7 ml 30.78 ml/m  RA Area:     11.70 cm LA Vol (A4C):   34.9 ml 20.78 ml/m  RA Volume:   24.70 ml  14.71 ml/m LA Biplane Vol: 43.6 ml 25.96 ml/m   AORTA Ao Root diam: 3.20 cm Ao Asc diam:  3.30 cm MITRAL VALVE                TRICUSPID VALVE MV Area (PHT): 4.77 cm     TR Peak grad:   46.8 mmHg MV Decel Time: 159 msec     TR Vmax:        342.00 cm/s MV E velocity: 78.00 cm/s MV A velocity: 124.00 cm/s  SHUNTS MV E/A ratio:  0.63         Systemic Diam: 2.10 cm Lonni Nanas MD Electronically signed by Lonni Nanas MD Signature Date/Time: 10/08/2024/3:11:32 PM    Final    US  THORACENTESIS ASP PLEURAL SPACE W/IMG GUIDE Result Date: 10/07/2024 INDICATION: [id number] Pleural effusion on right [id number] Inpatient being treated for pneumonia with bilateral pleural effusion. Team requesting right diagnostic and therapeutic thoracentesis. EXAM: ULTRASOUND GUIDED RIGHT THORACENTESIS MEDICATIONS: 8 mL 1% lidocaine  COMPLICATIONS: None immediate. PROCEDURE: An ultrasound guided thoracentesis was thoroughly discussed  with the patient and questions answered. The benefits, risks, alternatives and complications were also discussed. The patient understands and wishes to proceed with the procedure. Written consent was obtained. Ultrasound was performed to localize and mark an adequate pocket of fluid in the right chest. The area was then prepped and draped in the normal sterile fashion. 1% Lidocaine  was used for local anesthesia. Under ultrasound guidance a 19 gauge, 7-cm, Yueh catheter was introduced with patient in lateral decubitus position. Thoracentesis was performed. The catheter was removed and a dressing applied. FINDINGS: A total of approximately 550 mL of clear, yellow fluid was removed. Samples were sent to the laboratory as requested by the clinical team. IMPRESSION: Successful ultrasound guided RIGHT thoracentesis yielding 550 mL of pleural fluid. Performed by Laymon Coast, NP under the supervision of Thom Hall, MD Electronically Signed   By: Thom Hall M.D.   On: 10/07/2024 15:55   DG Chest Port 1 View Result Date: 10/07/2024 CLINICAL DATA:  Post thoracentesis. EXAM: PORTABLE CHEST 1 VIEW COMPARISON:  Radiographs 10/06/2024 and 10/03/2024.  CT 09/15/2024. FINDINGS: 1226 hours. The heart size and mediastinal contours are stable. Interval decreased right pleural effusion with improved aeration of the right lung base. No evidence of pneumothorax. Probable residual small bilateral pleural effusions with left greater than right basilar airspace opacities. The bones appear unchanged. IMPRESSION: Interval decreased right pleural effusion with improved aeration of the right lung base. No evidence of pneumothorax. Electronically Signed   By: Elsie Perone M.D.   On:  10/07/2024 12:58        Scheduled Meds:  (feeding supplement) PROSource Plus  30 mL Oral BID BM   aspirin  EC  81 mg Oral Daily   enoxaparin  (LOVENOX ) injection  40 mg Subcutaneous Q24H   feeding supplement  1 Container Oral BID BM   folic  acid  1 mg Oral Daily   insulin  aspart  0-6 Units Subcutaneous TID WC   latanoprost   1 drop Both Eyes QHS   midodrine   5 mg Oral TID WC   multivitamin with minerals  1 tablet Oral Daily   Continuous Infusions:  cefTRIAXone (ROCEPHIN)  IV 1 g (10/09/24 0912)     LOS: 6 days       Renato Applebaum, MD [facility]

## 2024-10-09 NOTE — Plan of Care (Signed)
  Problem: Education: Goal: Ability to describe self-care measures that may prevent or decrease complications (Diabetes Survival Skills Education) will improve Outcome: Not Progressing Goal: Individualized Educational Video(s) Outcome: Not Progressing   Problem: Coping: Goal: Ability to adjust to condition or change in health will improve Outcome: Not Progressing   Problem: Fluid Volume: Goal: Ability to maintain a balanced intake and output will improve Outcome: Not Progressing   Problem: Health Behavior/Discharge Planning: Goal: Ability to identify and utilize available resources and services will improve Outcome: Not Progressing Goal: Ability to manage health-related needs will improve Outcome: Not Progressing   Problem: Metabolic: Goal: Ability to maintain appropriate glucose levels will improve Outcome: Not Progressing   Problem: Nutritional: Goal: Maintenance of adequate nutrition will improve Outcome: Not Progressing Goal: Progress toward achieving an optimal weight will improve Outcome: Not Progressing   Problem: Skin Integrity: Goal: Risk for impaired skin integrity will decrease Outcome: Not Progressing   Problem: Tissue Perfusion: Goal: Adequacy of tissue perfusion will improve Outcome: Not Progressing   Problem: Education: Goal: Knowledge of General Education information will improve Description: Including pain rating scale, medication(s)/side effects and non-pharmacologic comfort measures Outcome: Not Progressing

## 2024-10-09 NOTE — Progress Notes (Signed)
 [facility] Infectious Diseases Follow Up Note  Patient Identification: Patient Name: Erica [name] MRN: [id number] Admit Date: 10/03/2024  2:42 PM Age: 78 y.o.Today's Date: 10/09/2024  Reason for Visit: Fevers  Principal Problem:   Acute encephalopathy Active Problems:   Pleural effusion   Symptomatic anemia   Chronic myeloid leukemia (HCC)   Diabetes mellitus type 2 in nonobese [facility])   Essential hypertension   Peripheral neuropathy   Acute on chronic heart failure with preserved ejection fraction (HFpEF) (HCC)   UTI (urinary tract infection)   Pneumonia   Pressure injury of skin   Dyspnea  Antibiotics:  Ceftriaxone 11/10- Azithromycin 11/10-11/14   Lines/Hardware:  Interval Events: Remains afebrile, last episode of fever was 11/13 at 1244 hrs. 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 Did not have fever at home. She reports being dizzy PTA. No reported cough, shortness of breath or chest pain or GI or GU symptoms during admit.    Recently hospitalized 10/23-10/29 for symptomatic anemia and possible sepsis treated with empiric antibiotics and blood transfusion. No infective source of fevers identified.   # Intermittent fevers of unclear cause -Differentials:    Probable pneumonia with pleural effusion s/p thoracentesis on 11/14. Cx NGTD. Does not seem to be empyema. Exudative by light's criteria ( no serum LDH)  ? UTI   11/10, 11/11 and 11/13 blood cx and 11/10 urine cx NG   11/14 RVP negative    Superficial thrombphlebitis in RT UE   Unlikely tick borne illness  #  Pancytopenia  # Symptomatic anemia - S/p 2 unit PRBC just admission  # CML - Ponatinib  on hold.  Evaluated by oncology  Recommendations - Will DC ceftriaxone - Monitor 24 hours  off antibiotics before discharge  - Will not pursue infectious work up further like EBV, CMV serologies. - Monitor CBC with differential - Universal/standard isolation precaution ID will sign off.  Recall back if any questions or concerns or febrile  Rest of the management as per the primary team. Thank you for the consult. Please page with pertinent questions or concerns.  ______________________________________________________________________ Subjective patient seen and examined at the bedside.  She just woke up from her sleep when I went to examine and states she is hungry and wants to eat.  No other concerns.   Vitals BP (!) 117/55 (BP Location: Left Arm)   Pulse (!) 109   Temp 98.6 F (37 C) (Oral)   Resp 16   Ht 4' 11 (1.499 m)   Wt 72.8 kg   SpO2 95%   BMI 32.42 kg/m     Physical Exam Constitutional: Elderly female lying in the bed, nontoxic-appearing    Comments: HEENT WNL  Cardiovascular:     Rate and Rhythm: Normal rate      Heart sounds  Pulmonary:     Effort: Pulmonary effort is normal.     Comments:   Abdominal:     Palpations: Abdomen is nondistended    Tenderness:   Musculoskeletal:        General: No swelling or tenderness in peripheral joints  Skin:    Comments: No rashes or wounds  Neurological:  General: Awake alert and oriented  Psychiatric:        Mood and Affect: Mood normal.   Pertinent Microbiology Results for orders placed or performed during the hospital encounter of 10/03/24  Blood Culture (routine x 2)     Status: None   Collection Time: 10/03/24  5:51 PM   Specimen: BLOOD  Result Value Ref Range Status   Specimen Description   Final    BLOOD LEFT ANTECUBITAL Performed at [facility], [address], [place], [place]  [address]    Special Requests   Final    BOTTLES DRAWN AEROBIC AND ANAEROBIC Blood Culture adequate volume Performed at [facility], [address], [place], [place] [address]    Culture   Final    NO GROWTH 5 DAYS Performed at [facility], [address] [place], [place] [address]    Report Status 10/08/2024 FINAL  Final  Resp panel by RT-PCR (RSV, Flu A&B, Covid) Anterior Nasal Swab     Status: None   Collection Time: 10/03/24  6:50 PM   Specimen: Anterior Nasal Swab  Result Value Ref Range Status   SARS Coronavirus 2 by RT PCR NEGATIVE NEGATIVE Final    Comment: (NOTE) SARS-CoV-2 target nucleic acids are NOT DETECTED.  The SARS-CoV-2 RNA is generally detectable in upper respiratory specimens during the acute phase of infection. The lowest concentration of SARS-CoV-2 viral copies this assay can detect is 138 copies/mL. A negative result does not preclude SARS-Cov-2 infection and should not be used as the sole basis for treatment or other patient management decisions. A negative result may occur with  improper specimen collection/handling, submission of specimen other than nasopharyngeal swab, presence of viral mutation(s) within the areas targeted by this assay, and inadequate number of viral copies(<138 copies/mL). A negative result must be combined with clinical observations, patient history, and epidemiological information. The expected result is Negative.  Fact Sheet for Patients:  bloggercourse.com  Fact Sheet for Healthcare Providers:  seriousbroker.it  This test is no t yet approved or cleared by the [place]  FDA and  has been authorized for detection and/or diagnosis of SARS-CoV-2 by FDA under an Emergency Use Authorization (EUA). This EUA will remain  in effect (meaning this test can be used) for the duration of the COVID-19 declaration under Section 564(b)(1) of the Act,  21 U.S.C.section 360bbb-3(b)(1), unless the authorization is terminated  or revoked sooner.       Influenza A by PCR NEGATIVE NEGATIVE Final   Influenza B by PCR NEGATIVE NEGATIVE Final    Comment: (NOTE) The Xpert Xpress SARS-CoV-2/FLU/RSV plus assay is intended as an aid in the diagnosis of influenza from Nasopharyngeal swab specimens and should not be used as a sole basis for treatment. Nasal washings and aspirates are unacceptable for Xpert Xpress SARS-CoV-2/FLU/RSV testing.  Fact Sheet for Patients: bloggercourse.com  Fact Sheet for Healthcare Providers: seriousbroker.it  This test is not yet approved or cleared by the [place]  FDA and has been authorized for detection and/or diagnosis of SARS-CoV-2 by FDA under an Emergency Use Authorization (EUA). This EUA will remain in effect (meaning this test can be used) for the duration of the COVID-19 declaration under Section 564(b)(1) of the Act, 21 U.S.C. section 360bbb-3(b)(1), unless the authorization is terminated or revoked.     Resp Syncytial Virus by PCR NEGATIVE NEGATIVE Final    Comment: (NOTE) Fact Sheet for Patients: bloggercourse.com  Fact Sheet for Healthcare Providers: seriousbroker.it  This  test is not yet approved or cleared by the [place]  FDA and has been authorized for detection and/or diagnosis of SARS-CoV-2 by FDA under an Emergency Use Authorization (EUA). This EUA will remain in effect (meaning this test can be used) for the duration of the COVID-19 declaration under Section 564(b)(1) of the Act, 21 U.S.C. section 360bbb-3(b)(1), unless the authorization is terminated or revoked.  Performed at [facility], [address] [place], [place] [address]   Urine Culture     Status: None   Collection Time: 10/03/24  8:55 PM   Specimen: Urine, Random  Result Value Ref Range  Status   Specimen Description   Final    URINE, RANDOM Performed at [facility], [address] [place], [place] [address]    Special Requests   Final    NONE Reflexed from [id number] Performed at [facility], [address], [place], [place] [address]    Culture   Final    NO GROWTH Performed at [facility], [address], [place], [place] [address]    Report Status 10/05/2024 FINAL  Final  Blood Culture (routine x 2)     Status: None (Preliminary result)   Collection Time: 10/04/24  4:31 AM   Specimen: BLOOD LEFT HAND  Result Value Ref Range Status   Specimen Description   Final    BLOOD LEFT HAND Performed at [facility], [address], [place], [place] [address]    Special Requests   Final    Blood Culture adequate volume BOTTLES DRAWN AEROBIC AND ANAEROBIC Performed at [facility], [address], [place], [place] [address]    Culture   Final    NO GROWTH 4 DAYS Performed at [facility], [address], [place], [place] [address]    Report Status PENDING  Incomplete  Culture, blood (Routine X 2) w Reflex to ID Panel     Status: None (Preliminary result)   Collection Time: 10/06/24  1:45 PM   Specimen: BLOOD LEFT ARM  Result Value Ref Range Status   Specimen Description   Final    BLOOD LEFT ARM Performed at [facility], [address], [place], [place] [address]    Special Requests   Final    BOTTLES DRAWN AEROBIC ONLY Blood Culture adequate volume Performed at [facility], [address] [place], [place] [address]    Culture   Final    NO GROWTH 2 DAYS Performed at [facility], [address], [place], [place] [address]    Report Status PENDING  Incomplete  Culture, blood (Routine X 2) w Reflex to ID Panel     Status: None (Preliminary result)   Collection Time: 10/06/24  1:56 PM   Specimen: BLOOD LEFT ARM  Result Value Ref Range  Status   Specimen Description   Final    BLOOD LEFT ARM Performed at [facility], [address] [place], [place] [address]    Special Requests   Final    BOTTLES DRAWN AEROBIC AND ANAEROBIC Blood Culture adequate volume Performed at [facility], [address] [place], [place] [address]    Culture   Final    NO GROWTH 2 DAYS Performed at [facility], [address] [place], [place] [address]    Report Status PENDING  Incomplete  Body fluid culture w Gram Stain  Status: None (Preliminary result)   Collection Time: 10/07/24 12:24 PM   Specimen: PATH Cytology Pleural fluid  Result Value Ref Range Status   Specimen Description   Final    PLEURAL Performed at [facility], [address] [place], [place] [address]    Special Requests   Final    NONE Performed at [facility], [address] [place], [place] [address]    Gram Stain   Final    FEW WBC PRESENT, PREDOMINANTLY MONONUCLEAR NO ORGANISMS SEEN    Culture   Final    NO GROWTH < 24 HOURS Performed at [facility], [address], [place], [place] [address]    Report Status PENDING  Incomplete  Respiratory (~20 pathogens) panel by PCR     Status: None   Collection Time: 10/07/24 11:59 PM   Specimen: Nasopharyngeal Swab; Respiratory  Result Value Ref Range Status   Adenovirus NOT DETECTED NOT DETECTED Final   Coronavirus 229E NOT DETECTED NOT DETECTED Final    Comment: (NOTE) The Coronavirus on the Respiratory Panel, DOES NOT test for the novel  Coronavirus (2019 nCoV)    Coronavirus HKU1 NOT DETECTED NOT DETECTED Final   Coronavirus NL63 NOT DETECTED NOT DETECTED Final   Coronavirus OC43 NOT DETECTED NOT DETECTED Final   Metapneumovirus NOT DETECTED NOT DETECTED Final   Rhinovirus / Enterovirus NOT DETECTED NOT DETECTED Final   Influenza A NOT DETECTED NOT DETECTED Final   Influenza B NOT DETECTED NOT DETECTED Final    Parainfluenza Virus 1 NOT DETECTED NOT DETECTED Final   Parainfluenza Virus 2 NOT DETECTED NOT DETECTED Final   Parainfluenza Virus 3 NOT DETECTED NOT DETECTED Final   Parainfluenza Virus 4 NOT DETECTED NOT DETECTED Final   Respiratory Syncytial Virus NOT DETECTED NOT DETECTED Final   Bordetella pertussis NOT DETECTED NOT DETECTED Final   Bordetella Parapertussis NOT DETECTED NOT DETECTED Final   Chlamydophila pneumoniae NOT DETECTED NOT DETECTED Final   Mycoplasma pneumoniae NOT DETECTED NOT DETECTED Final    Comment: Performed at [facility], [address] [place], [place] [address]   Pertinent Lab.    Latest Ref Rng & Units 10/07/2024    4:08 AM 10/06/2024    7:51 AM 10/05/2024    4:26 AM  CBC  WBC 4.0 - 10.5 K/uL 3.5  3.8  3.9   Hemoglobin 12.0 - 15.0 g/dL 9.0  9.2  6.8   Hematocrit 36.0 - 46.0 % 26.3  26.5  20.5   Platelets 150 - 400 K/uL 101  103  118       Latest Ref Rng & Units 10/07/2024    4:08 AM 10/06/2024    7:51 AM 10/05/2024    4:26 AM  CMP  Glucose 70 - 99 mg/dL 881  97  868   BUN 8 - 23 mg/dL 13  10  10    Creatinine 0.44 - 1.00 mg/dL 9.42  9.42  9.37   Sodium 135 - 145 mmol/L 133  135  136   Potassium 3.5 - 5.1 mmol/L 3.8  3.7  3.7   Chloride 98 - 111 mmol/L 97  98  100   CO2 22 - 32 mmol/L 22  21  24    Calcium 8.9 - 10.3 mg/dL 8.4  8.6  8.3   Total Protein 6.5 - 8.1 g/dL 5.6   5.5   Total Bilirubin 0.0 - 1.2 mg/dL 0.6   0.5   Alkaline Phos 38 -  126 U/L 62   65   AST 15 - 41 U/L 29   28   ALT 0 - 44 U/L 7   7      Pertinent Imaging today Plain films and CT images have been personally visualized and interpreted; radiology reports have been reviewed. Decision making incorporated into the Impression   VAS US  UPPER EXTREMITY VENOUS DUPLEX Result Date: 10/09/2024 UPPER VENOUS STUDY  Patient Name:  Erica [name]  Date of Exam:   10/09/2024 Medical Rec #: [id number]         Accession #:    [id number] Date of Birth: [birth year]        Patient  Gender: F Patient Age:   23 years Exam Location:  [facility] Procedure:      VAS US  UPPER EXTREMITY VENOUS DUPLEX Referring Phys: ANNALEE OREM --------------------------------------------------------------------------------  Indications: Fevers of unclear cause. Other Indications: Chronic myeloid leukemia. Comparison Study: No prior exam. Performing Technologist: Edilia Elden Appl  Examination Guidelines: A complete evaluation includes B-mode imaging, spectral Doppler, color Doppler, and power Doppler as needed of all accessible portions of each vessel. Bilateral testing is considered an integral part of a complete examination. Limited examinations for reoccurring indications may be performed as noted.  Right Findings: +----------+------------+---------+-----------+----------+-------+ RIGHT     CompressiblePhasicitySpontaneousPropertiesSummary +----------+------------+---------+-----------+----------+-------+ IJV           Full       Yes       Yes                      +----------+------------+---------+-----------+----------+-------+ Subclavian               Yes       Yes                      +----------+------------+---------+-----------+----------+-------+ Axillary      Full       Yes       Yes                      +----------+------------+---------+-----------+----------+-------+ Brachial      Full       Yes       Yes                      +----------+------------+---------+-----------+----------+-------+ Radial        Full                                          +----------+------------+---------+-----------+----------+-------+ Ulnar         Full                                          +----------+------------+---------+-----------+----------+-------+ Cephalic      None       No        No                       +----------+------------+---------+-----------+----------+-------+ Basilic       Full       Yes       Yes                       +----------+------------+---------+-----------+----------+-------+ Superficial vein  thrombosis noted in the right cephalic vein at the distal upper arm to the antecubital fossa by IV line.  Left Findings: +----------+------------+---------+-----------+----------+-------+ LEFT      CompressiblePhasicitySpontaneousPropertiesSummary +----------+------------+---------+-----------+----------+-------+ IJV           Full       Yes       Yes                      +----------+------------+---------+-----------+----------+-------+ Subclavian               Yes       Yes                      +----------+------------+---------+-----------+----------+-------+ Axillary      Full       Yes       Yes                      +----------+------------+---------+-----------+----------+-------+ Brachial      Full       Yes       Yes                      +----------+------------+---------+-----------+----------+-------+ Radial        Full                                          +----------+------------+---------+-----------+----------+-------+ Ulnar         Full                                          +----------+------------+---------+-----------+----------+-------+ Cephalic      None       No        No                       +----------+------------+---------+-----------+----------+-------+ Basilic       Full       Yes       Yes                      +----------+------------+---------+-----------+----------+-------+ Superficial vein thrombosis noted in the left cephalic vein at the antecubital fossa to the proximal forearm.  Summary:  Right: No evidence of deep vein thrombosis in the upper extremity. Findings consistent with acute superficial vein thrombosis involving the right cephalic vein.  Left: No evidence of deep vein thrombosis in the upper extremity. Findings consistent with acute superficial vein thrombosis involving the left cephalic vein.  *See table(s) above for  measurements and observations.     Preliminary    ECHOCARDIOGRAM COMPLETE Result Date: 10/08/2024    ECHOCARDIOGRAM REPORT   Patient Name:   Erica [name] Date of Exam: 10/08/2024 Medical Rec #:  [id number]        Height:       59.0 in Accession #:    [id number]       Weight:       160.5 lb Date of Birth:  [birth year]       BSA:          1.680 m Patient Age:    77 years         BP:  119/56 mmHg Patient Gender: F                HR:           109 bpm. Exam Location:  Inpatient Procedure: 2D Echo (Both Spectral and Color Flow Doppler were utilized during            procedure). Indications:    Dyspnea  History:        Patient has prior history of Echocardiogram examinations. CHF;                 Risk Factors:Hypertension.  Sonographer:    Charmaine Gaskins Referring Phys: [id number] RUBENS C CHANG IMPRESSIONS  1. Left ventricular ejection fraction, by estimation, is 70 to 75%. The left ventricle has hyperdynamic function. Mild left ventricular hypertrophy. Left ventricular diastolic parameters are indeterminate. Intracavitary gradient measures at rest  2. Right ventricular systolic function is normal. The right ventricular size is normal. There is moderately elevated pulmonary artery systolic pressure. The estimated right ventricular systolic pressure is 49.8 mmHg.  3. Moderate pericardial effusion. There is no evidence of cardiac tamponade.  4. The mitral valve is normal in structure. Trivial mitral valve regurgitation.  5. Tricuspid valve regurgitation is mild to moderate.  6. The aortic valve is tricuspid. Aortic valve regurgitation is not visualized. Aortic valve sclerosis/calcification is present, without any evidence of aortic stenosis.  7. The inferior vena cava is normal in size with greater than 50% respiratory variability, suggesting right atrial pressure of 3 mmHg. FINDINGS  Left Ventricle: Left ventricular ejection fraction, by estimation, is 70 to 75%. The left ventricle has hyperdynamic  function. The left ventricle has no regional wall motion abnormalities. The left ventricular internal cavity size was normal in size. There is mild left ventricular hypertrophy. Left ventricular diastolic parameters are indeterminate. Right Ventricle: The right ventricular size is normal. No increase in right ventricular wall thickness. Right ventricular systolic function is normal. There is moderately elevated pulmonary artery systolic pressure. The tricuspid regurgitant velocity is 3.42 m/s, and with an assumed right atrial pressure of 3 mmHg, the estimated right ventricular systolic pressure is 49.8 mmHg. Left Atrium: Left atrial size was normal in size. Right Atrium: Right atrial size was normal in size. Pericardium: A moderately sized pericardial effusion is present. There is no evidence of cardiac tamponade. Mitral Valve: The mitral valve is normal in structure. Trivial mitral valve regurgitation. Tricuspid Valve: The tricuspid valve is normal in structure. Tricuspid valve regurgitation is mild to moderate. Aortic Valve: The aortic valve is tricuspid. Aortic valve regurgitation is not visualized. Aortic valve sclerosis/calcification is present, without any evidence of aortic stenosis. Pulmonic Valve: The pulmonic valve was normal in structure. Pulmonic valve regurgitation is trivial. Aorta: The aortic root and ascending aorta are structurally normal, with no evidence of dilitation. Venous: The inferior vena cava is normal in size with greater than 50% respiratory variability, suggesting right atrial pressure of 3 mmHg. IAS/Shunts: The interatrial septum was not well visualized.  LEFT VENTRICLE PLAX 2D LVOT diam:     2.10 cm   Diastology LVOT Area:     3.46 cm  LV e' medial:    10.80 cm/s                          LV E/e' medial:  7.2  LV e' lateral:   7.29 cm/s                          LV E/e' lateral: 10.7  RIGHT VENTRICLE RV Basal diam:  2.40 cm RV Mid diam:    2.50 cm RV S prime:      17.70 cm/s LEFT ATRIUM             Index        RIGHT ATRIUM           Index LA Vol (A2C):   51.7 ml 30.78 ml/m  RA Area:     11.70 cm LA Vol (A4C):   34.9 ml 20.78 ml/m  RA Volume:   24.70 ml  14.71 ml/m LA Biplane Vol: 43.6 ml 25.96 ml/m   AORTA Ao Root diam: 3.20 cm Ao Asc diam:  3.30 cm MITRAL VALVE                TRICUSPID VALVE MV Area (PHT): 4.77 cm     TR Peak grad:   46.8 mmHg MV Decel Time: 159 msec     TR Vmax:        342.00 cm/s MV E velocity: 78.00 cm/s MV A velocity: 124.00 cm/s  SHUNTS MV E/A ratio:  0.63         Systemic Diam: 2.10 cm Lonni Nanas MD Electronically signed by Lonni Nanas MD Signature Date/Time: 10/08/2024/3:11:32 PM    Final     I spent 50 minutes involved in face-to-face and non-face-to-face activities for this patient on the day of the visit. Professional time spent includes the following activities: Preparing to see the patient (review of tests), Obtaining and reviewing separately obtained history (hospitalist progress note), performing a medically appropriate examination and evaluation , Ordering medications/labs, referring and communicating with other health care professionals, Documenting clinical information in the EMR, Independently interpreting results (not separately reported), and Care coordination (not separately reported).   Plan d/w requesting provider as well as ID pharm D  Of note, portions of this note may have been created with voice recognition software. While this note has been edited for accuracy, occasional wrong-word or 'sound-a-like' substitutions may have occurred due to the inherent limitations of voice recognition software.   Electronically signed by:   Annalee Orem, MD Infectious Disease Physician [facility] Infect[facility] Pager: [phone]

## 2024-10-09 NOTE — Progress Notes (Signed)
 PMT brief progress note  Seen earlier this am.  Resting in bed, pleasant and responds appropriately BP (!) 117/55 (BP Location: Left Arm)   Pulse (!) 109   Temp 98.6 F (37 C) (Oral)   Resp 16   Ht 4' 11 (1.499 m)   Wt 72.8 kg   SpO2 95%   BMI 32.42 kg/m  Labs and imaging noted Chart reviewed [facility] MD documentation noted.  As per PMT discussions - patient and HCPOA son wish for continuation of full code full scope care, patient to follow up with her usual oncologist. Recommend palliative support for ongoing goals of care discussions at the patient's cancer center if possible.  Continue current mode of care Low MDM Lonia Serve MD [facility] palliative.

## 2024-10-10 DIAGNOSIS — G9340 Encephalopathy, unspecified: Secondary | ICD-10-CM | POA: Diagnosis not present

## 2024-10-10 LAB — CBC WITH DIFFERENTIAL/PLATELET
Abs Immature Granulocytes: 0.32 K/uL — ABNORMAL HIGH (ref 0.00–0.07)
Basophils Absolute: 0.1 K/uL (ref 0.0–0.1)
Basophils Relative: 1 %
Eosinophils Absolute: 0 K/uL (ref 0.0–0.5)
Eosinophils Relative: 1 %
HCT: 26.9 % — ABNORMAL LOW (ref 36.0–46.0)
Hemoglobin: 8.8 g/dL — ABNORMAL LOW (ref 12.0–15.0)
Immature Granulocytes: 6 %
Lymphocytes Relative: 20 %
Lymphs Abs: 1 K/uL (ref 0.7–4.0)
MCH: 28.5 pg (ref 26.0–34.0)
MCHC: 32.7 g/dL (ref 30.0–36.0)
MCV: 87.1 fL (ref 80.0–100.0)
Monocytes Absolute: 1.5 K/uL — ABNORMAL HIGH (ref 0.1–1.0)
Monocytes Relative: 30 %
Neutro Abs: 2.2 K/uL (ref 1.7–7.7)
Neutrophils Relative %: 42 %
Platelets: 86 K/uL — ABNORMAL LOW (ref 150–400)
RBC: 3.09 MIL/uL — ABNORMAL LOW (ref 3.87–5.11)
RDW: 18 % — ABNORMAL HIGH (ref 11.5–15.5)
WBC: 5.1 K/uL (ref 4.0–10.5)
nRBC: 0 % (ref 0.0–0.2)

## 2024-10-10 LAB — BASIC METABOLIC PANEL WITH GFR
Anion gap: 20 — ABNORMAL HIGH (ref 5–15)
BUN: 23 mg/dL (ref 8–23)
CO2: 19 mmol/L — ABNORMAL LOW (ref 22–32)
Calcium: 8.7 mg/dL — ABNORMAL LOW (ref 8.9–10.3)
Chloride: 96 mmol/L — ABNORMAL LOW (ref 98–111)
Creatinine, Ser: 0.75 mg/dL (ref 0.44–1.00)
GFR, Estimated: >60 mL/min (ref >60)
Glucose, Bld: 148 mg/dL — ABNORMAL HIGH (ref 70–99)
Potassium: 4.6 mmol/L (ref 3.5–5.1)
Sodium: 135 mmol/L (ref 135–145)

## 2024-10-10 LAB — BODY FLUID CULTURE W GRAM STAIN: Culture: NO GROWTH

## 2024-10-10 LAB — GLUCOSE, CAPILLARY
Glucose-Capillary: 140 mg/dL — ABNORMAL HIGH (ref 70–99)
Glucose-Capillary: 140 mg/dL — ABNORMAL HIGH (ref 70–99)
Glucose-Capillary: 141 mg/dL — ABNORMAL HIGH (ref 70–99)
Glucose-Capillary: 140 mg/dL — ABNORMAL HIGH (ref 70–99)
Glucose-Capillary: 142 mg/dL — ABNORMAL HIGH (ref 70–99)

## 2024-10-10 LAB — PHOSPHORUS: Phosphorus: 2.3 mg/dL — ABNORMAL LOW (ref 2.5–4.6)

## 2024-10-10 LAB — MAGNESIUM: Magnesium: 2.3 mg/dL (ref 1.7–2.4)

## 2024-10-10 MED ORDER — K PHOS MONO-SOD PHOS DI & MONO 155-852-130 MG PO TABS
250.0000 mg | ORAL_TABLET | Freq: Two times a day (BID) | ORAL | Status: AC
  Filled 2024-10-10 (×2): qty 1

## 2024-10-10 MED ORDER — SENNOSIDES-DOCUSATE SODIUM 8.6-50 MG PO TABS
1.0000 | ORAL_TABLET | Freq: Two times a day (BID) | ORAL | Status: DC
  Administered 2024-10-10: 1 via ORAL
  Filled 2024-10-10 (×3): qty 1

## 2024-10-10 MED ORDER — POLYETHYLENE GLYCOL 3350 17 G PO PACK
17.0000 g | PACK | Freq: Two times a day (BID) | ORAL | Status: DC
  Administered 2024-10-10 – 2024-10-11 (×2): 17 g via ORAL
  Filled 2024-10-10 (×3): qty 1

## 2024-10-10 NOTE — Progress Notes (Signed)
 Physical Therapy Treatment Patient Details Name: Erica [name] MRN: [id number] DOB: [birth year] Today's Date: 10/10/2024   History of Present Illness Erica [name] is a 78 yo female admitted to the hospital with weakness, confusion, decreased appetite and peripheral edema. She was found to have symptomatic anemia, acute encephalopathy and sepsis due to PNA and UTI. PMH: CML, recent hospital admission due to anemia and sepsis, DM, eczema, glaucoma, HTN, MVP, chronic diastolic heart failure    PT Comments  Pt obtunded on PT arrival; eventually aroused with multi-modal stimuli but only for brief periods. Son present, encouraging pt to participate. Pt requiring significantly incr assist compared to prior PT sessions. Overall mod-max assist, +2 assist to scoot up in bed. Able to stand very briefly with mod assist. Noted incr WOB with minimally activity, SpO2=86% on RA (O2 replaced at 2L with sats incr to 99%), HR 128 max. RN present EOS. If pt d/c's home will likely need hospital bed, w/c and possibly hoyer lift.  Continue to follow in acute setting   If plan is discharge home, recommend the following: Two people to help with walking and/or transfers;Two people to help with bathing/dressing/bathroom;Help with stairs or ramp for entrance;Assist for transportation;Assistance with cooking/housework;Assistance with feeding   Can travel by private vehicle     No  Equipment Recommendations  Other (comment) (TBD-w/c and hospital bed if home)    Recommendations for Other Services       Precautions / Restrictions Precautions Precautions: Fall     Mobility  Bed Mobility Overal bed mobility: Needs Assistance Bed Mobility: Supine to Sit, Sit to Supine Rolling: Max assist, Total assist   Supine to sit: Max assist, Mod assist Sit to supine: Max assist, Mod assist   General bed mobility comments: pt requiring incr assist for bed mobility/rolling. minimal pt effort despite cues and son  encouraging;    Transfers Overall transfer level: Needs assistance Equipment used: Rolling walker (2 wheels) Transfers: Sit to/from Stand Sit to Stand: Min assist, Mod assist           General transfer comment: assist to power up to stand, pt only able to maintain standing briefly before sitting back on to bed and trying to lie down. HR 128, incr WOB, SpO2 =86%, O2 replaced at [employer] quickly returning to 99%    Ambulation/Gait               General Gait Details: unable   Stairs             Wheelchair Mobility     Tilt Bed    Modified Rankin (Stroke Patients Only)       Balance                                            Communication Communication Communication: Impaired Factors Affecting Communication: Difficulty expressing self  Cognition Arousal: Obtunded Behavior During Therapy: WFL for tasks assessed/performed   PT - Cognitive impairments: Sequencing, Problem solving   Orientation impairments: Time, Situation                   PT - Cognition Comments: pt minimally verbal even when more alert during session Following commands: Impaired Following commands impaired: Follows one step commands inconsistently    Cueing Cueing Techniques: Verbal cues, Gestural cues, Tactile cues  Exercises      General Comments  Pertinent Vitals/Pain Pain Assessment Pain Assessment: Faces Faces Pain Scale: Hurts little more Pain Location: generalized/back- with rolling Pain Descriptors / Indicators: Discomfort, Grimacing Pain Intervention(s): Limited activity within patient's tolerance, Monitored during session, Repositioned    Home Living                          Prior Function            PT Goals (current goals can now be found in the care plan section) Acute Rehab PT Goals PT Goal Formulation: With patient Time For Goal Achievement: 10/20/24 Potential to Achieve Goals: Fair Progress towards PT  goals: Not progressing toward goals - comment (medical status)    Frequency    Min 2X/week      PT Plan      Co-evaluation              AM-PAC PT 6 Clicks Mobility   Outcome Measure  Help needed turning from your back to your side while in a flat bed without using bedrails?: Total Help needed moving from lying on your back to sitting on the side of a flat bed without using bedrails?: A Lot Help needed moving to and from a bed to a chair (including a wheelchair)?: A Lot Help needed standing up from a chair using your arms (e.g., wheelchair or bedside chair)?: A Lot Help needed to walk in hospital room?: Total Help needed climbing 3-5 steps with a railing? : Total 6 Click Score: 9    End of Session Equipment Utilized During Treatment: Gait belt Activity Tolerance: Patient limited by lethargy Patient left: in bed;with call bell/phone within reach;with bed alarm set;with family/visitor present;with nursing/sitter in room Nurse Communication: Mobility status PT Visit Diagnosis: Unsteadiness on feet (R26.81);Difficulty in walking, not elsewhere classified (R26.2);Muscle weakness (generalized) (M62.81)     Time: 1630-1650 PT Time Calculation (min) (ACUTE ONLY): 20 min  Charges:    $Therapeutic Activity: 8-22 mins PT General Charges $$ ACUTE PT VISIT: 1 Visit                     Maebry Obrien, PT  Acute Rehab Dept [facility]) [phone]  10/10/2024    [facility] 10/10/2024, 5:25 PM

## 2024-10-10 NOTE — Progress Notes (Signed)
 PROGRESS NOTE    [name]  FMW:[id number] DOB: 01-17-1946 DOA: 10/03/2024 PCP: Erica Elsie SAUNDERS, MD   Brief Narrative:  78 year old with CML on Ponatinib , type 2 diabetes, hypertension hyperlipidemia and peripheral neuropathy. Recent hospitalization with symptomatic anemia and sepsis treated with empiric antibiotics and blood transfusions came back to emergency room about 2 weeks from discharge again with profound generalized weakness, confusion, increased lower extremity edema and fall at home. In the emergency room temperature 101.4 chest x-ray with right lower lobe effusion, UA abnormal. Hemoglobin 6.5 with recent hemoglobin of 9.3 on 10/30. Hemoccult negative. TSH, B12 and ammonia negative. CT head without significant findings. Blood cultures were drawn. Started on ceftriaxone and azithromycin.  MRI of the brain and lower extremity venous Dopplers were ordered.  Assessment & Plan:   Principal Problem:   Acute encephalopathy Active Problems:   Symptomatic anemia   Chronic myeloid leukemia (HCC)   Pleural effusion   Diabetes mellitus type 2 in nonobese [facility])   Essential hypertension   Peripheral neuropathy   Acute on chronic heart failure with preserved ejection fraction (HFpEF) (HCC)   UTI (urinary tract infection)   Pneumonia   Pressure injury of skin   Dyspnea  Symptomatic anemia with history of CML 2 units of PRBC transfusion 2 weeks ago 2 units PRBC transfusion this admission. FOBT negative. Hemoglobin responded appropriately at this time. No evidence of hemolysis. Seen by oncology, does not think related to CML or Ponatinib , on hold for now. Patient follows up with oncology at [facility], Dr Perri Bern hemoglobin tomorrow morning.   Rule out infection:  Sepsis due to pneumonia and possible UTI, POA Presented with fever, tachycardia.  Chest x-ray with hazy patchy opacity right lung base and small right pleural effusion concerning for pneumonia. Blood cultures  and urine cultures negative so far. DC Rocephin  Blood cultures on admission, negative Blood cultures 11/13, negative Thoracentesis 11/14, 550 mL removed. Exudate. Nucleated cells 7400. Cultures negative so far. Procalcitonin 0.9 Per ID:  - Monitor 24 hours  off antibiotics before discharge  - Will not pursue infectious work up further like EBV, CMV serologies.  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  No focal deficits.  EEG negative. B12, TSH and ammonia normal. Encephalopathy improved.  Treating infection.  Abnormal brain MRI: As above. Seen by neurology. Recommended low-dose aspirin . Generalized weakness: Continue to work with PT OT.  Recommended SNF.  Patient and family prefers to go home with home health. 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 Repeat echocardiogram with normal ejection fraction. Avoiding diuretics now. Thrombocytopenia: Stable.  Recheck tomorrow. Hypotension: Blood pressure improving on midodrine    DVT prophylaxis: enoxaparin  (LOVENOX ) injection 40 mg Start: 10/03/24 2215 Code Status:   Code Status: Full Code Family Communication: Son updated over the phone  Status is: Inpt Dispo: The patient is from: Home              Anticipated d/c is to: To be determined              Anticipated d/c date is: To be determined              Patient currently not medically stable for discharge  Consultants:  Palliative care, oncology, infectious disease, neurology  Antimicrobials:  Currently on hold  Subjective: No acute issues or events overnight, patient's mental status continues to wax and  wane, markedly somnolence this morning review of systems somewhat limited  Objective: Vitals:   10/09/24 1836 10/09/24 2308 10/10/24 0146 10/10/24 0629  BP: (!) 120/56 (!) 97/53 (!)  94/53 119/62  Pulse: (!) 116 (!) 118 (!) 120 (!) 117  Resp: 20 17 15 15   Temp: 97.8 F (36.6 C) 98.2 F (36.8 C) 98 F (36.7 C) 98 F (36.7 C)  TempSrc: Axillary     SpO2: 92% 92% 90% 100%  Weight:      Height:        Intake/Output Summary (Last 24 hours) at 10/10/2024 0819 Last data filed at 10/09/2024 1709 Gross per 24 hour  Intake 50 ml  Output 200 ml  Net -150 ml   Filed Weights   10/04/24 1434  Weight: 72.8 kg    Examination: General: Somnolent, arousable HEENT:  Normocephalic atraumatic.  Sclerae nonicteric, noninjected.  Extraocular movements intact bilaterally. Neck:  Without mass or deformity.  Trachea is midline. Lungs:  Clear to auscultate bilaterally without rhonchi, wheeze, or rales. Heart:  Regular rate and rhythm.  Without murmurs, rubs, or gallops. Abdomen:  Soft, nontender, nondistended.  Without guarding or rebound. Extremities: Without cyanosis, clubbing, edema, or obvious deformity.  Data Reviewed: I have personally reviewed following labs and imaging studies  CBC: Recent Labs  Lab 10/04/24 0431 10/05/24 0426 10/06/24 0751 10/07/24 0408 10/10/24 0401  WBC 4.2 3.9* 3.8* 3.5* 5.1  NEUTROABS  --   --   --  1.7 2.2  HGB 7.8* 6.8* 9.2* 9.0* 8.8*  HCT 23.7* 20.5* 26.5* 26.3* 26.9*  MCV 86.5 86.1 84.7 85.4 87.1  PLT 126* 118* 103* 101* 86*   Basic Metabolic Panel: Recent Labs  Lab 10/04/24 0431 10/05/24 0426 10/06/24 0751 10/07/24 0408 10/10/24 0401  NA 135 136 135 133* 135  K 3.9 3.7 3.7 3.8 4.6  CL 97* 100 98 97* 96*  CO2 24 24 21* 22 19*  GLUCOSE 104* 131* 97 118* 148*  BUN 11 10 10 13 23   CREATININE 0.62 0.62 0.57 0.57 0.75  CALCIUM 8.6* 8.3* 8.6* 8.4* 8.7*  MG  --  2.1 2.1 2.0 2.3  PHOS  --   --  2.3*  --  2.3*   GFR: Estimated Creatinine Clearance: 51.1 mL/min (by C-G formula based on SCr of 0.75 mg/dL). Liver Function Tests: Recent Labs  Lab 10/03/24 1502 10/04/24 0431 10/05/24 0426 10/06/24 0751 10/07/24 0408  AST  37 35 28  --  29  ALT 9 7 7   --  7  ALKPHOS 85 80 65  --  62  BILITOT 0.7 0.6 0.5  --  0.6  PROT 6.4* 5.9* 5.5*  --  5.6*  ALBUMIN 3.3* 3.0* 2.9* 3.0* 2.8*   No results for input(s): LIPASE, AMYLASE in the last 168 hours. Recent Labs  Lab 10/03/24 1850  AMMONIA 14   Coagulation Profile: No results for input(s): INR, PROTIME in the last 168 hours. Cardiac Enzymes: Recent Labs  Lab 10/05/24 0426  CKTOTAL 31*   BNP (last 3 results) No results for input(s): PROBNP in the last 8760 hours. HbA1C: No results for input(s): HGBA1C in the last 72 hours. CBG: Recent Labs  Lab 10/09/24 1138 10/09/24 1658 10/09/24 2024 10/10/24 0400 10/10/24 0748  GLUCAP 158* 158* 154* 140* 140*   Lipid Profile: No results for input(s): CHOL, HDL, LDLCALC, TRIG, CHOLHDL, LDLDIRECT in the last 72 hours. Thyroid Function Tests: No results for input(s): TSH, T4TOTAL, FREET4, T3FREE, THYROIDAB in the last 72 hours.  Anemia Panel: No results for input(s): VITAMINB12, FOLATE, FERRITIN, TIBC, IRON, RETICCTPCT in the last 72 hours. Sepsis Labs: Recent Labs  Lab 10/03/24 1805 10/07/24 0408  PROCALCITON  --  0.90  LATICACIDVEN 1.9  --     Recent Results (from the past 240 hours)  Blood Culture (routine x 2)     Status: None   Collection Time: 10/03/24  5:51 PM   Specimen: BLOOD  Result Value Ref Range Status   Specimen Description   Final    BLOOD LEFT ANTECUBITAL Performed at [facility], [address], [place], [place] [address]    Special Requests   Final    BOTTLES DRAWN AEROBIC AND ANAEROBIC Blood Culture adequate volume Performed at [facility], [address], [place], [place] [address]    Culture   Final    NO GROWTH 5 DAYS Performed at [facility], [address] [place], [place] [address]    Report Status 10/08/2024 FINAL  Final  Resp panel by RT-PCR (RSV, Flu A&B, Covid) Anterior Nasal  Swab     Status: None   Collection Time: 10/03/24  6:50 PM   Specimen: Anterior Nasal Swab  Result Value Ref Range Status   SARS Coronavirus 2 by RT PCR NEGATIVE NEGATIVE Final    Comment: (NOTE) SARS-CoV-2 target nucleic acids are NOT DETECTED.  The SARS-CoV-2 RNA is generally detectable in upper respiratory specimens during the acute phase of infection. The lowest concentration of SARS-CoV-2 viral copies this assay can detect is 138 copies/mL. A negative result does not preclude SARS-Cov-2 infection and should not be used as the sole basis for treatment or other patient management decisions. A negative result may occur with  improper specimen collection/handling, submission of specimen other than nasopharyngeal swab, presence of viral mutation(s) within the areas targeted by this assay, and inadequate number of viral copies(<138 copies/mL). A negative result must be combined with clinical observations, patient history, and epidemiological information. The expected result is Negative.  Fact Sheet for Patients:  bloggercourse.com  Fact Sheet for Healthcare Providers:  seriousbroker.it  This test is no t yet approved or cleared by the [place]  FDA and  has been authorized for detection and/or diagnosis of SARS-CoV-2 by FDA under an Emergency Use Authorization (EUA). This EUA will remain  in effect (meaning this test can be used) for the duration of the COVID-19 declaration under Section 564(b)(1) of the Act, 21 U.S.C.section 360bbb-3(b)(1), unless the authorization is terminated  or revoked sooner.       Influenza A by PCR NEGATIVE NEGATIVE Final   Influenza B by PCR NEGATIVE NEGATIVE Final    Comment: (NOTE) The Xpert Xpress SARS-CoV-2/FLU/RSV plus assay is intended as an aid in the diagnosis of influenza from Nasopharyngeal swab specimens and should not be used as a sole basis for treatment. Nasal washings and aspirates  are unacceptable for Xpert Xpress SARS-CoV-2/FLU/RSV testing.  Fact Sheet for Patients: bloggercourse.com  Fact Sheet for Healthcare Providers: seriousbroker.it  This test is not yet approved or cleared by the [place]  FDA and has been authorized for detection and/or diagnosis of SARS-CoV-2 by FDA under an Emergency Use Authorization (EUA). This EUA will remain in effect (meaning this test can be used) for the duration of the COVID-19 declaration under Section 564(b)(1) of the Act, 21 U.S.C. section 360bbb-3(b)(1), unless the authorization is terminated or revoked.     Resp Syncytial Virus by PCR NEGATIVE NEGATIVE Final    Comment: (NOTE)  Fact Sheet for Patients: bloggercourse.com  Fact Sheet for Healthcare Providers: seriousbroker.it  This test is not yet approved or cleared by the [place]  FDA and has been authorized for detection and/or diagnosis of SARS-CoV-2 by FDA under an Emergency Use Authorization (EUA). This EUA will remain in effect (meaning this test can be used) for the duration of the COVID-19 declaration under Section 564(b)(1) of the Act, 21 U.S.C. section 360bbb-3(b)(1), unless the authorization is terminated or revoked.  Performed at [facility], [address] [place], [place] [address]   Urine Culture     Status: None   Collection Time: 10/03/24  8:55 PM   Specimen: Urine, Random  Result Value Ref Range Status   Specimen Description   Final    URINE, RANDOM Performed at [facility], [address] [place], [place] [address]    Special Requests   Final    NONE Reflexed from [id number] Performed at [facility], [address] [place], [place] [address]    Culture   Final    NO GROWTH Performed at [facility], [address], [place], [place] [address]    Report Status  10/05/2024 FINAL  Final  Blood Culture (routine x 2)     Status: None   Collection Time: 10/04/24  4:31 AM   Specimen: BLOOD LEFT HAND  Result Value Ref Range Status   Specimen Description   Final    BLOOD LEFT HAND Performed at [facility], [address], [place], [place] [address]    Special Requests   Final    Blood Culture adequate volume BOTTLES DRAWN AEROBIC AND ANAEROBIC Performed at [facility], [address] [place], [place] [address]    Culture   Final    NO GROWTH 5 DAYS Performed at [facility], [address], [place], [place] [address]    Report Status 10/09/2024 FINAL  Final  Culture, blood (Routine X 2) w Reflex to ID Panel     Status: None (Preliminary result)   Collection Time: 10/06/24  1:45 PM   Specimen: BLOOD LEFT ARM  Result Value Ref Range Status   Specimen Description   Final    BLOOD LEFT ARM Performed at [facility], [address], [place], [place] [address]    Special Requests   Final    BOTTLES DRAWN AEROBIC ONLY Blood Culture adequate volume Performed at [facility], [address], [place], [place] [address]    Culture   Final    NO GROWTH 3 DAYS Performed at [facility], [address], [place], [place] [address]    Report Status PENDING  Incomplete  Culture, blood (Routine X 2) w Reflex to ID Panel     Status: None (Preliminary result)   Collection Time: 10/06/24  1:56 PM   Specimen: BLOOD LEFT ARM  Result Value Ref Range Status   Specimen Description   Final    BLOOD LEFT ARM Performed at [facility], [address], [place], [place] [address]    Special Requests   Final    BOTTLES DRAWN AEROBIC AND ANAEROBIC Blood Culture adequate volume Performed at [facility], [address], [place], [place] [address]    Culture   Final    NO GROWTH 3 DAYS Performed at [facility], [address], [place], [place] [address]     Report  Status PENDING  Incomplete  Body fluid culture w Gram Stain     Status: None (Preliminary result)   Collection Time: 10/07/24 12:24 PM   Specimen: PATH Cytology Pleural fluid  Result Value Ref Range Status   Specimen Description   Final    PLEURAL Performed at [facility], [address] [place], [place] [address]    Special Requests   Final    NONE Performed at [facility], [address], [place], [place] [address]    Gram Stain   Final    FEW WBC PRESENT, PREDOMINANTLY MONONUCLEAR NO ORGANISMS SEEN    Culture   Final    NO GROWTH 2 DAYS Performed at [facility], [address], [place], [place] [address]    Report Status PENDING  Incomplete  Respiratory (~20 pathogens) panel by PCR     Status: None   Collection Time: 10/07/24 11:59 PM   Specimen: Nasopharyngeal Swab; Respiratory  Result Value Ref Range Status   Adenovirus NOT DETECTED NOT DETECTED Final   Coronavirus 229E NOT DETECTED NOT DETECTED Final    Comment: (NOTE) The Coronavirus on the Respiratory Panel, DOES NOT test for the novel  Coronavirus (2019 nCoV)    Coronavirus HKU1 NOT DETECTED NOT DETECTED Final   Coronavirus NL63 NOT DETECTED NOT DETECTED Final   Coronavirus OC43 NOT DETECTED NOT DETECTED Final   Metapneumovirus NOT DETECTED NOT DETECTED Final   Rhinovirus / Enterovirus NOT DETECTED NOT DETECTED Final   Influenza A NOT DETECTED NOT DETECTED Final   Influenza B NOT DETECTED NOT DETECTED Final   Parainfluenza Virus 1 NOT DETECTED NOT DETECTED Final   Parainfluenza Virus 2 NOT DETECTED NOT DETECTED Final   Parainfluenza Virus 3 NOT DETECTED NOT DETECTED Final   Parainfluenza Virus 4 NOT DETECTED NOT DETECTED Final   Respiratory Syncytial Virus NOT DETECTED NOT DETECTED Final   Bordetella pertussis NOT DETECTED NOT DETECTED Final   Bordetella Parapertussis NOT DETECTED NOT DETECTED Final   Chlamydophila pneumoniae NOT DETECTED NOT DETECTED Final    Mycoplasma pneumoniae NOT DETECTED NOT DETECTED Final    Comment: Performed at [facility], [address], [place], [place] [address]         Radiology Studies: VAS US  UPPER EXTREMITY VENOUS DUPLEX Result Date: 10/10/2024 UPPER VENOUS STUDY  Patient Name:  Erica [name]  Date of Exam:   10/09/2024 Medical Rec #: [id number]         Accession #:    [id number] Date of Birth: 01-31-1946        Patient Gender: F Patient Age:   76 years Exam Location:  [facility] Procedure:      VAS US  UPPER EXTREMITY VENOUS DUPLEX Referring Phys: ANNALEE OREM --------------------------------------------------------------------------------  Indications: Fevers of unclear cause. Other Indications: Chronic myeloid leukemia. Comparison Study: No prior exam. Performing Technologist: Edilia Elden Appl  Examination Guidelines: A complete evaluation includes B-mode imaging, spectral Doppler, color Doppler, and power Doppler as needed of all accessible portions of each vessel. Bilateral testing is considered an integral part of a complete examination. Limited examinations for reoccurring indications may be performed as noted.  Right Findings: +----------+------------+---------+-----------+----------+-------+ RIGHT     CompressiblePhasicitySpontaneousPropertiesSummary +----------+------------+---------+-----------+----------+-------+ IJV           Full       Yes       Yes                      +----------+------------+---------+-----------+----------+-------+  Subclavian               Yes       Yes                      +----------+------------+---------+-----------+----------+-------+ Axillary      Full       Yes       Yes                      +----------+------------+---------+-----------+----------+-------+ Brachial      Full       Yes       Yes                      +----------+------------+---------+-----------+----------+-------+ Radial        Full                                           +----------+------------+---------+-----------+----------+-------+ Ulnar         Full                                          +----------+------------+---------+-----------+----------+-------+ Cephalic      None       No        No                       +----------+------------+---------+-----------+----------+-------+ Basilic       Full       Yes       Yes                      +----------+------------+---------+-----------+----------+-------+ Superficial vein thrombosis noted in the right cephalic vein at the distal upper arm to the antecubital fossa by IV line.  Left Findings: +----------+------------+---------+-----------+----------+-------+ LEFT      CompressiblePhasicitySpontaneousPropertiesSummary +----------+------------+---------+-----------+----------+-------+ IJV           Full       Yes       Yes                      +----------+------------+---------+-----------+----------+-------+ Subclavian               Yes       Yes                      +----------+------------+---------+-----------+----------+-------+ Axillary      Full       Yes       Yes                      +----------+------------+---------+-----------+----------+-------+ Brachial      Full       Yes       Yes                      +----------+------------+---------+-----------+----------+-------+ Radial        Full                                          +----------+------------+---------+-----------+----------+-------+ Ulnar         Full                                          +----------+------------+---------+-----------+----------+-------+  Cephalic      None       No        No                       +----------+------------+---------+-----------+----------+-------+ Basilic       Full       Yes       Yes                      +----------+------------+---------+-----------+----------+-------+ Superficial vein thrombosis noted in the left  cephalic vein at the antecubital fossa to the proximal forearm.  Summary:  Right: No evidence of deep vein thrombosis in the upper extremity. Findings consistent with acute superficial vein thrombosis involving the right cephalic vein.  Left: No evidence of deep vein thrombosis in the upper extremity. Findings consistent with acute superficial vein thrombosis involving the left cephalic vein.  *See table(s) above for measurements and observations.  Diagnosing physician: Debby Robertson Electronically signed by Debby Robertson on 10/10/2024 at 8:10:58 AM.    Final    ECHOCARDIOGRAM COMPLETE Result Date: 10/08/2024    ECHOCARDIOGRAM REPORT   Patient Name:   Erica [name] Date of Exam: 10/08/2024 Medical Rec #:  [id number]        Height:       59.0 in Accession #:    [id number]       Weight:       160.5 lb Date of Birth:  [birth year]       BSA:          1.680 m Patient Age:    77 years         BP:           119/56 mmHg Patient Gender: F                HR:           109 bpm. Exam Location:  Inpatient Procedure: 2D Echo (Both Spectral and Color Flow Doppler were utilized during            procedure). Indications:    Dyspnea  History:        Patient has prior history of Echocardiogram examinations. CHF;                 Risk Factors:Hypertension.  Sonographer:    Charmaine Gaskins Referring Phys: [id number] RUBENS C CHANG IMPRESSIONS  1. Left ventricular ejection fraction, by estimation, is 70 to 75%. The left ventricle has hyperdynamic function. Mild left ventricular hypertrophy. Left ventricular diastolic parameters are indeterminate. Intracavitary gradient measures at rest  2. Right ventricular systolic function is normal. The right ventricular size is normal. There is moderately elevated pulmonary artery systolic pressure. The estimated right ventricular systolic pressure is 49.8 mmHg.  3. Moderate pericardial effusion. There is no evidence of cardiac tamponade.  4. The mitral valve is normal in structure. Trivial  mitral valve regurgitation.  5. Tricuspid valve regurgitation is mild to moderate.  6. The aortic valve is tricuspid. Aortic valve regurgitation is not visualized. Aortic valve sclerosis/calcification is present, without any evidence of aortic stenosis.  7. The inferior vena cava is normal in size with greater than 50% respiratory variability, suggesting right atrial pressure of 3 mmHg. FINDINGS  Left Ventricle: Left ventricular ejection fraction, by estimation, is 70 to 75%. The left ventricle has hyperdynamic function. The left ventricle has no regional wall motion abnormalities. The left ventricular internal cavity size was normal  in size. There is mild left ventricular hypertrophy. Left ventricular diastolic parameters are indeterminate. Right Ventricle: The right ventricular size is normal. No increase in right ventricular wall thickness. Right ventricular systolic function is normal. There is moderately elevated pulmonary artery systolic pressure. The tricuspid regurgitant velocity is 3.42 m/s, and with an assumed right atrial pressure of 3 mmHg, the estimated right ventricular systolic pressure is 49.8 mmHg. Left [facility]: Left atrial size was normal in size. Right [facility]: Right atrial size was normal in size. Pericardium: A moderately sized pericardial effusion is present. There is no evidence of cardiac tamponade. Mitral Valve: The mitral valve is normal in structure. Trivial mitral valve regurgitation. Tricuspid Valve: The tricuspid valve is normal in structure. Tricuspid valve regurgitation is mild to moderate. Aortic Valve: The aortic valve is tricuspid. Aortic valve regurgitation is not visualized. Aortic valve sclerosis/calcification is present, without any evidence of aortic stenosis. Pulmonic Valve: The pulmonic valve was normal in structure. Pulmonic valve regurgitation is trivial. Aorta: The aortic root and ascending aorta are structurally normal, with no evidence of dilitation. Venous: The inferior  vena cava is normal in size with greater than 50% respiratory variability, suggesting right atrial pressure of 3 mmHg. IAS/Shunts: The interatrial septum was not well visualized.  LEFT VENTRICLE PLAX 2D LVOT diam:     2.10 cm   Diastology LVOT Area:     3.46 cm  LV e' medial:    10.80 cm/s                          LV E/e' medial:  7.2                          LV e' lateral:   7.29 cm/s                          LV E/e' lateral: 10.7  RIGHT VENTRICLE RV Basal diam:  2.40 cm RV Mid diam:    2.50 cm RV S prime:     17.70 cm/s LEFT ATRIUM             Index        RIGHT ATRIUM           Index LA Vol (A2C):   51.7 ml 30.78 ml/m  RA Area:     11.70 cm LA Vol (A4C):   34.9 ml 20.78 ml/m  RA Volume:   24.70 ml  14.71 ml/m LA Biplane Vol: 43.6 ml 25.96 ml/m   AORTA Ao Root diam: 3.20 cm Ao Asc diam:  3.30 cm MITRAL VALVE                TRICUSPID VALVE MV Area (PHT): 4.77 cm     TR Peak grad:   46.8 mmHg MV Decel Time: 159 msec     TR Vmax:        342.00 cm/s MV E velocity: 78.00 cm/s MV A velocity: 124.00 cm/s  SHUNTS MV E/A ratio:  0.63         Systemic Diam: 2.10 cm Lonni Nanas MD Electronically signed by Lonni Nanas MD Signature Date/Time: 10/08/2024/3:11:32 PM    Final         Scheduled Meds:  (feeding supplement) PROSource Plus  30 mL Oral BID BM   aspirin  EC  81 mg Oral Daily   enoxaparin  (LOVENOX ) injection  40 mg Subcutaneous Q24H  feeding supplement  1 Container Oral BID BM   folic acid  1 mg Oral Daily   insulin  aspart  0-6 Units Subcutaneous TID WC   latanoprost   1 drop Both Eyes QHS   midodrine   5 mg Oral TID WC   multivitamin with minerals  1 tablet Oral Daily   Continuous Infusions:   LOS: 7 days    Time spent:    Elsie JAYSON Montclair, DO [facility]  If 7PM-7AM, please contact night-coverage [email or web address]  10/10/2024, 8:19 AM

## 2024-10-10 NOTE — Progress Notes (Signed)
   10/10/24 0854  Assess: MEWS Score  Temp 98.1 F (36.7 C)  BP 112/63  MAP (mmHg) 76  Pulse Rate (!) 116  ECG Heart Rate (!) 115  Resp 20  Level of Consciousness Alert  SpO2 96 %  O2 Device Room Air  Patient Activity (if Appropriate) In chair  Assess: MEWS Score  MEWS Temp 0  MEWS Systolic 0  MEWS Pulse 2  MEWS RR 0  MEWS LOC 0  MEWS Score 2  MEWS Score Color Yellow  Assess: if the MEWS score is Yellow or Red  Were vital signs accurate and taken at a resting state? Yes  Does the patient meet 2 or more of the SIRS criteria? No  Does the patient have a confirmed or suspected source of infection? No  MEWS guidelines implemented  No, previously yellow, continue vital signs every 4 hours  Notify: Charge Nurse/RN  Name of Charge Nurse/RN Notified Gustavo Dimes, RN  Assess: SIRS CRITERIA  SIRS Temperature  0  SIRS Respirations  0  SIRS Pulse 1  SIRS WBC 0  SIRS Score Sum  1

## 2024-10-10 NOTE — Progress Notes (Signed)
 PMT brief progress note  Seen earlier this am.  Resting in chair, pleasant and responds appropriately BP 112/63 (BP Location: Left Arm)   Pulse (!) 116   Temp 98.1 F (36.7 C)   Resp 20   Ht 4' 11 (1.499 m)   Wt 72.8 kg   SpO2 96%   BMI 32.42 kg/m  Labs and imaging noted Chart reviewed TR[facility] MD documentation noted.  As per PMT discussions - patient and HCPOA son wish for continuation of full code full scope care, patient to follow up with her usual oncologist. Recommend palliative support for ongoing goals of care discussions at the patient's cancer center if possible. Patient has elected for returning back to home, declines SNF rehab at this time, TOC note reviewed.  Continue current mode of care Low MDM Lonia Serve MD [facility] palliative.

## 2024-10-10 NOTE — TOC Progression Note (Signed)
 Transition of Care [facility]) - Progression Note    Patient Details  Name: Erica [name] MRN: [id number] Date of Birth: Feb 18, 1946  [facility]) CM/SW Contact  Tawni CHRISTELLA Eva, LCSW Phone Number: 10/10/2024, 9:14 AM  Clinical Narrative:     CSW spoke with the [name], [name]. He stated that the pt has declined SNF placement and would like to return home with home health services. The pt is active with [facility] for home health services and will need new orders placed. Pt's son reports that she has a walker and cane at home and has no other DME needs at this time. Pt's son reports that the family will provide transportation home.  Expected Discharge Plan: Home w Home Health Services Barriers to Discharge: Continued Medical Work up               Expected Discharge Plan and Services       Living arrangements for the past 2 months: Single Family Home                                       Social Drivers of Health (SDOH) Interventions SDOH Screenings   Food Insecurity: Patient Unable To Answer (10/04/2024)  Housing: Unknown (10/04/2024)  Transportation Needs: No Transportation Needs (10/04/2024)  Utilities: Not At Risk (10/04/2024)  Social Connections: Unknown (10/04/2024)  Tobacco Use: Low Risk  (10/03/2024)    Readmission Risk Interventions     No data to display

## 2024-10-10 NOTE — Progress Notes (Signed)
 MD notified throughout the shift about patient being more lethargic than what patient was this morning. Patient was able to take her schedule morning meds, but after that patient would not wake up well enough to take the rest of schedule meds. RN tried a few times to attempt to give patient's afternoon/evening meds, but patient not able to take oral meds safely. When trying to attempt to feed patient, patient will not eat. MD aware of all this.

## 2024-10-11 DIAGNOSIS — G9340 Encephalopathy, unspecified: Secondary | ICD-10-CM | POA: Diagnosis not present

## 2024-10-11 LAB — GLUCOSE, CAPILLARY
Glucose-Capillary: 156 mg/dL — ABNORMAL HIGH (ref 70–99)
Glucose-Capillary: 185 mg/dL — ABNORMAL HIGH (ref 70–99)
Glucose-Capillary: 173 mg/dL — ABNORMAL HIGH (ref 70–99)
Glucose-Capillary: 161 mg/dL — ABNORMAL HIGH (ref 70–99)

## 2024-10-11 LAB — CULTURE, BLOOD (ROUTINE X 2)
Culture: NO GROWTH
Culture: NO GROWTH
Special Requests: ADEQUATE
Special Requests: ADEQUATE

## 2024-10-11 LAB — CBC
HCT: 28.5 % — ABNORMAL LOW (ref 36.0–46.0)
Hemoglobin: 8.8 g/dL — ABNORMAL LOW (ref 12.0–15.0)
MCH: 28.6 pg (ref 26.0–34.0)
MCHC: 30.9 g/dL (ref 30.0–36.0)
MCV: 92.5 fL (ref 80.0–100.0)
Platelets: 74 K/uL — ABNORMAL LOW (ref 150–400)
RBC: 3.08 MIL/uL — ABNORMAL LOW (ref 3.87–5.11)
RDW: 18.6 % — ABNORMAL HIGH (ref 11.5–15.5)
WBC: 6.1 K/uL (ref 4.0–10.5)
nRBC: 0 % (ref 0.0–0.2)

## 2024-10-11 LAB — CYTOLOGY - NON PAP

## 2024-10-11 MED ORDER — FLEET ENEMA RE ENEM
1.0000 | ENEMA | Freq: Once | RECTAL | Status: DC
  Filled 2024-10-11: qty 1

## 2024-10-11 MED ORDER — LACTATED RINGERS IV SOLN: Freq: Once | INTRAVENOUS | Status: AC

## 2024-10-11 MED ORDER — ENSURE PLUS HIGH PROTEIN PO LIQD: 237.0000 mL | Freq: Two times a day (BID) | ORAL | Status: DC

## 2024-10-11 MED ORDER — NYSTATIN 100000 UNIT/ML MT SUSP
5.0000 mL | Freq: Four times a day (QID) | OROMUCOSAL | Status: DC
  Administered 2024-10-11 (×2): 500000 [IU] via ORAL
  Filled 2024-10-11 (×2): qty 5

## 2024-10-11 NOTE — Progress Notes (Signed)
 Nutrition Follow-up  DOCUMENTATION CODES:   Obesity unspecified  INTERVENTION: - Regular diet.  - SLP eval pending.   - Boost Breeze po BID, each supplement provides 250 kcal and 9 grams of protein - ProSource Plus po BID, each supplement provides 100 kcal and 15 grams of protein - Ensure Plus High Protein po BID, each supplement provides 350 kcal and 20 grams of protein.   - Encourage intake at all meals and of supplements.    - Multivitamin with minerals daily.   - Monitor weight trends.   NUTRITION DIAGNOSIS:   Inadequate oral intake related to poor appetite, lethargy/confusion as evidenced by meal completion < 25%. *ongoing  GOAL:   Patient will meet greater than or equal to 90% of their needs *not met  MONITOR:   PO intake, Supplement acceptance, Weight trends, Skin  REASON FOR ASSESSMENT:   Consult Assessment of nutrition requirement/status  ASSESSMENT:   78 y.o. 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 Admitted for sepsis d/t PNA and UTI and acute encephalopathy.  Patient in bedside chair at time of visit. Appears confused and not oriented.   Son and sister at bedside. Son confirms the patient has been eating very poorly. She is documented to be consuming 0-25% of meals. 0% of all meals yesterday. She has also been refusing supplements the past 4 days.  Per RN, patient now too lethargic to take meds/oral intake.   Son reports the patient has had increased difficultly swallowing the past 48-72 hours. SLP eval has already been ordered.  Son inquiring about an appetite stimulant or feeding tube. Informed son would discuss with MD. For now, son and aunt report the patient seems to be doing okay with chicken broth and likes unsweetened ice tea, will add these to all trays.  Will also trial Ensure again as patient was  drinking these PTA and son reports she has been a little more accepting of these.  Discussed with MD. Per MD, patient's living will indicates no feeding tube so this is not an option.   Inquired about appetite stimulant as well. Unfortunately, not an option at this time due to potential side effects per MD.  Patient has not had a BM since 11/11 so constipation may be contributing. Bowel regimen was added yesterday, hopeful for BM soon.  Otherwise, waiting on SLP eval for further recs.    Medications reviewed and include: 1mg  folic acid , MVI, Miralax , Senokot   Labs reviewed:  Phosphorus 2.3 HA1C 6.9 (as of 11/11) Blood Glucose 140-156 x24 hours  Diet Order:   Diet Order             Diet regular Room service appropriate? Yes; Fluid consistency: Thin  Diet effective now                   EDUCATION NEEDS:  Education needs have been addressed  Skin:  Skin Assessment: Skin Integrity Issues: Skin Integrity Issues:: Stage II Stage II: Right Buttocks  Last BM:  11/11 per pt  Height:  Ht Readings from Last 1 Encounters:  10/04/24 4' 11 (1.499 m)   Weight:  Wt Readings from Last 1 Encounters:  10/04/24 72.8 kg   Ideal Body Weight:  44.7 kg  BMI:  Body mass index is 32.42 kg/m.  Estimated Nutritional Needs:  Kcal:  1750-1950 kcals Protein:  80-95 grams Fluid:  >/=  1.7L    Trude Ned RD, LDN Contact via Secure Chat.

## 2024-10-11 NOTE — Evaluation (Addendum)
 Clinical/Bedside Swallow Evaluation Patient Details  Name: Erica [name] MRN: [id number] Date of Birth: [birth year]  Today's Date: 10/11/2024 Time: SLP Start Time (ACUTE ONLY): 1635 SLP Stop Time (ACUTE ONLY): 1655 SLP Time Calculation (min) (ACUTE ONLY): 20 min  Past Medical History:  Past Medical History:  Diagnosis Date   Bilateral swelling of feet    Cancer (HCC)    Cataract    Diabetes mellitus without complication (HCC)    diet controlled   Eczema    Elevated cholesterol    GERD (gastroesophageal reflux disease)    Glaucoma    Hypertension    Mitral valve prolapse    Simple endometrial hyperplasia without atypia 07/2016   In endometrial polyp with surrounding endometrium inactive recommend follow up ultrasound for endometrial echo 1 year   Spondylisthesis    Past Surgical History:  Past Surgical History:  Procedure Laterality Date   CESAREAN SECTION     DILATATION & CURETTAGE/HYSTEROSCOPY WITH MYOSURE N/A 08/05/2016   Procedure: DILATATION & CURETTAGE/HYSTEROSCOPY WITH MYOSURE;  Surgeon: Evalene SHAUNNA Organ, MD;  Location: [facility];  Service: Gynecology;  Laterality: N/A;   EXCISION OF SKIN TAG Left 08/05/2016   Procedure: EXCISION OF SKIN TAG;  Surgeon: Evalene SHAUNNA Organ, MD;  Location: [facility];  Service: Gynecology;  Laterality: Left;   MOUTH SURGERY     spinal tap     THORACENTESIS N/A 11/02/2023   Procedure: THORACENTESIS;  Surgeon: Annella Donnice SAUNDERS, MD;  Location: [facility] ENDOSCOPY;  Service: Pulmonary;  Laterality: N/A;   HPI:  Pt adm 10/04/2024 with AMS, UA with bacteria and trace LE. CML HX. MRI  Punctate left body corpus callosum diffusion abnormality suspected, although This is nonspecific,  ? D/t metabolic disturbance, small vessel ischemia, seizure, infection, demyelination, or radiation and chemotherapy; o/w WNL  Most recent CXR Interval decreased right pleural effusion with improved aeration of  the right lung base AFTER thoracentesis.  No evidence of  pneumothorax.  Pt with very poor intake since admit and RN reports pt has been mostly lethargic during the day with islands of improved mentation. Palliative is seeing the patient.  Family wants full treatment and full code.   Swallow evaluation ordered.    Assessment / Plan / Recommendation  Clinical Impression  Pt is very lethargic at this time - even after being repositioned by SLP and RN.  She does open her eyes briefly and then returns to sleep.  Suspect she has oral candidiasis on bilateral buccal region - from limited view.  . Would recommend to change medications to IV if pt continues to be too lethargic for PO.  Provide tsps of clear liquids when fully alert and if tolerating.    Will follow up next date for further dysphagia evaluation when pt alert. Note that her intake has been poor since admission 8 days ago. She is s/p thoracentesis and repeat CXR showed Interval decreased right pleural effusion with improved aeration of the right lung base. No evidence of pneumothorax. Pt admits her intake has been poor via yes/no questions - but does not verbalize reasoning why - ? If oral candidiasis may contribute.      SLP Visit Diagnosis: Dysphagia, oropharyngeal phase (R13.12)    Aspiration Risk  Moderate aspiration risk;Risk for inadequate nutrition/hydration    Diet Recommendation Thin liquid (tsps of liquids only when fully alert and if tolerating)    Medication Administration: Via alternative means Compensations: Slow rate;Small sips/bites;Other (Comment) (observe pt to swallow - have oral suction set up  for use PRN   Other  Recommendations Oral Care Recommendations: Oral care QID (oral care prior to po intake of liquids) Caregiver Recommendations: Have oral suction available     Assistance Recommended at Discharge  Full assist  Functional Status Assessment Patient has had a recent decline in their functional status and demonstrates the ability to make significant improvements in  function in a reasonable and predictable amount of time.  Frequency and Duration min 1 x/week  1 week       Prognosis Prognosis for improved oropharyngeal function: Guarded      Swallow Study   General Date of Onset: 10/11/24 HPI: Pt adm 10/04/2024 with AMS, UA with bacteria and trace LE. CML HX. MRI  Punctate left body corpus callosum diffusion abnormality suspected, although This is nonspecific,  ? D/t metabolic disturbance, small vessel ischemia, seizure, infection, demyelination, or radiation and chemotherapy; o/w WNL  Most recent CXR Interval decreased right pleural effusion with improved aeration of  the right lung base. No evidence of pneumothorax.  Pt with very poor intake since admit and RN reports pt has been mostly lethargic during the day with islands of improved mentation. Palliative is seeing the patient.  Family wants full treatment and full code.   Swallow evaluation ordered. Type of Study: Bedside Swallow Evaluation Diet Prior to this Study: NPO Temperature Spikes Noted: No Respiratory Status: Room air History of Recent Intubation: No Behavior/Cognition: Alert;Cooperative;Pleasant mood Oral Cavity Assessment: Within Functional Limits Oral Care Completed by SLP: No Oral Cavity - Dentition: Adequate natural dentition Vision: Impaired for self-feeding Self-Feeding Abilities: Total assist;Able to feed self Patient Positioning: Upright in bed Baseline Vocal Quality: Low vocal intensity Volitional Cough: Cognitively unable to elicit Volitional Swallow: Unable to elicit    Oral/Motor/Sensory Function Overall Oral Motor/Sensory Function: Generalized oral weakness   Ice Chips Ice chips: Impaired Presentation: Spoon Other Comments: pt is not opening mouth adequately to accept po intake, nor does she follow directions   Thin Liquid Thin Liquid: Impaired Other Comments: 1/4 tsp provided which pt orally held and did not elicit swallow    Nectar Thick Nectar Thick Liquid: Not  tested   Honey Thick Honey Thick Liquid: Not tested   Puree Puree: Not tested   Solid     Solid: Not tested      Nicolas Emmie Caldron 10/11/2024,5:26 PM  Madelin POUR, MS [facility] SLP Acute Rehab Services Office [phone]

## 2024-10-11 NOTE — Progress Notes (Signed)
 Occupational Therapy Treatment Patient Details Name: Erica [name] MRN: [id number] DOB: 1945-12-25 Today's Date: 10/11/2024   History of present illness Erica [name] is a 78 yr old female admitted to the hospital with weakness, confusion, decreased appetite and peripheral edema. She was found to have symptomatic anemia, acute encephalopathy and sepsis due to PNA and UTI. PMH: CML, recent hospital admission due to anemia and sepsis, DM, eczema, glaucoma, HTN, MVP, chronic diastolic heart failure   OT comments  The pt required total assist for supine to sit. Once seated EOB, she required max assist to maintain static sitting balance. She was noted to be with increased somnolence throughout the session. She subsequently required max verbal, tactile, and gestural cues throughout for attention, alertness, orientation, and initiation. She presented with minimal initiation and was only able to state her name when asked orientation questions. Her overall functional abilities and occupational performance were notably impaired, as compared to her presentation and abilities during her last OT session. Given the aforementioned, the pt was not safe to attempt further out of bed activity. She was returned to supine. Continue OT plan of care. Patient will benefit from continued inpatient follow up therapy, <3 hours/day.       If plan is discharge home, recommend the following:  A lot of help with bathing/dressing/bathroom;Two people to help with walking and/or transfers;Direct supervision/assist for medications management;Direct supervision/assist for financial management;Supervision due to cognitive status;Help with stairs or ramp for entrance;Assistance with feeding;Assistance with cooking/housework;Assist for transportation   Equipment Recommendations  Tub/shower seat    Recommendations for Other Services      Precautions / Restrictions Precautions Precautions: Fall Restrictions Weight Bearing  Restrictions Per Provider Order: No       Mobility Bed Mobility Overal bed mobility: Needs Assistance Bed Mobility: Supine to Sit, Sit to Supine     Supine to sit: Total assist Sit to supine: Total assist   General bed mobility comments: required max cues for attention, initiation, and sequencing       Balance     Sitting balance-Leahy Scale: Poor         ADL either performed or assessed with clinical judgement   ADL Overall ADL's : Needs assistance/impaired        Lower Body Dressing: Total assistance;Bed level Lower Body Dressing Details (indicate cue type and reason): to donn/doff socks     Toileting- Clothing Manipulation and Hygiene: Total assistance;Bed level                     Communication Communication Communication: Impaired Factors Affecting Communication: Difficulty expressing self   Cognition Arousal: Obtunded Behavior During Therapy: Flat affect Cognition: Cognition impaired         Attention impairment (select first level of impairment): Divided attention, Selective attention, Alternating attention, Sustained attention, Focused attention Executive functioning impairment (select all impairments): Initiation, Problem solving, Sequencing, Reasoning, Organization OT - Cognition Comments:  (minimal initiation, impaired attention, she did not respond when asked orientation questions, required max assist for sequencing)                 Following commands: Impaired        Cueing   Cueing Techniques: Verbal cues, Gestural cues, Tactile cues, Visual cues             Pertinent Vitals/ Pain       Pain Assessment Pain Assessment: Faces Pain Score: 0-No pain   Frequency  [name] 2X/week  Progress Toward Goals  OT Goals(current goals can now be found in the care plan section)     Acute Rehab OT Goals OT Goal Formulation: With patient Time For Goal Achievement: 10/19/24 Potential to Achieve Goals: Fair  Plan          AM-PAC OT 6 Clicks Daily Activity     Outcome Measure   Help from another person eating meals?: Total Help from another person taking care of personal grooming?: Total Help from another person toileting, which includes using toliet, bedpan, or urinal?: Total Help from another person bathing (including washing, rinsing, drying)?: Total Help from another person to put on and taking off regular upper body clothing?: Total Help from another person to put on and taking off regular lower body clothing?: Total 6 Click Score: 6    End of Session Equipment Utilized During Treatment: Oxygen  OT Visit Diagnosis: Unsteadiness on feet (R26.81);Other abnormalities of gait and mobility (R26.89);Muscle weakness (generalized) (M62.81);Feeding difficulties (R63.3);Other symptoms and signs involving cognitive function   Activity Tolerance Patient limited by lethargy   Patient Left in bed;with call bell/phone within reach;with bed alarm set;with nursing/sitter in room   Nurse Communication Mobility status        Time: 8341-8288 OT Time Calculation ([name]): 13 [name]  Charges: OT General Charges $OT Visit: 1 Visit OT Treatments $Therapeutic Activity: 8-22 mins     Delanna JINNY Lesches, OTR/L 10/11/2024, 5:38 PM

## 2024-10-11 NOTE — Progress Notes (Signed)
 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 Recent hospitalization with symptomatic anemia and sepsis treated with empiric antibiotics and blood transfusions came back to emergency room about 2 weeks from discharge again with profound generalized weakness, confusion, increased lower extremity edema and fall at home. In the emergency room temperature 101.4 chest x-ray with right lower lobe effusion, UA abnormal. Hemoglobin 6.5 with recent hemoglobin of 9.3 on 10/30. Hemoccult negative. TSH, B12 and ammonia negative. CT head without significant findings. Blood cultures were drawn. Started on ceftriaxone and azithromycin.  MRI of the brain and lower extremity venous Dopplers were ordered.  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  No focal deficits.  EEG negative. B12, TSH and ammonia normal. Encephalopathy improved.  Treating infection. Patient mental status continues to wax/wane -somewhat improving over the past 24 hours  Failure to thrive Severe protein caloric malnutrition - Multifactorial secondary to above, even with improved mental status today patient continues to refuse food, having difficulty using a straw - Dietary following,  appreciate insight recommendations - Patient's living will states that she does not want a feeding tube, son has asked if placing a tube feed is appropriate which we discussed it is not based on patient's wishes  Symptomatic anemia with history of CML 2 units of PRBC transfusion 2 weeks ago 2 units PRBC transfusion this admission. FOBT negative. Hemoglobin responded appropriately at this time. No evidence of hemolysis. Seen by oncology, does not think related to CML or Ponatinib , on hold for now. Patient follows up with oncology at [facility], Dr Perri Bern hemoglobin tomorrow morning.   Sepsis due to pneumonia and possible UTI, POA Presented with fever, tachycardia.  Chest x-ray with hazy patchy opacity right lung base and small right pleural effusion concerning for pneumonia. Blood cultures and urine cultures negative so far. DC Rocephin  Blood cultures on admission, negative Blood cultures 11/13, negative Thoracentesis 11/14, 550 mL removed. Exudate. Nucleated cells 7400. Cultures negative so far. Procalcitonin 0.9 -Continue to hold antibiotics per infectious disease, no further inpatient workup at this time  Abnormal brain MRI: As above. Seen by neurology. Recommended low-dose aspirin . Generalized weakness: Continue to work with PT OT.  Recommended SNF.  Patient and family prefers to go home with home health. 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 Repeat echocardiogram with normal ejection fraction. Avoiding diuretics now. Thrombocytopenia: Stable.  Recheck tomorrow. Hypotension: Blood pressure improving on midodrine    DVT prophylaxis: enoxaparin  (LOVENOX ) injection 40 mg Start: 10/03/24 2215 Code Status:   Code Status: Full Code Family Communication: Son updated over the phone  Status is: Inpt Dispo: The patient is from: Home              Anticipated d/c is to: To be determined  Anticipated d/c date is: To be  determined              Patient currently not medically stable for discharge  Consultants:  Palliative care, oncology, infectious disease, neurology  Antimicrobials:  Currently on hold  Subjective: No acute issues or events overnight, patient's mental status somewhat improving today able to hold conversation but oriented to person and place only  Objective: Vitals:   10/10/24 1709 10/10/24 1955 10/10/24 2351 10/11/24 0414  BP: (!) 111/55 (!) 97/56 109/65 107/68  Pulse: (!) 118 (!) 115 (!) 120 (!) 120  Resp: 20 20 18 16   Temp: 98.4 F (36.9 C) 98.2 F (36.8 C) 98.3 F (36.8 C) 98.7 F (37.1 C)  TempSrc:  Oral    SpO2: 100% 95% 99% 98%  Weight:      Height:        Intake/Output Summary (Last 24 hours) at 10/11/2024 9175 Last data filed at 10/11/2024 0600 Gross per 24 hour  Intake 210 ml  Output 400 ml  Net -190 ml   Filed Weights   10/04/24 1434  Weight: 72.8 kg    Examination: General: Somnolent, arousable -oriented to person and place HEENT:  Normocephalic atraumatic.  Sclerae nonicteric, noninjected.  Extraocular movements intact bilaterally. Neck:  Without mass or deformity.  Trachea is midline. Lungs:  Clear to auscultate bilaterally without rhonchi, wheeze, or rales. Heart:  Regular rate and rhythm.  Without murmurs, rubs, or gallops. Abdomen:  Soft, nontender, nondistended.  Without guarding or rebound. Extremities: Without cyanosis, clubbing, edema, or obvious deformity.  Data Reviewed: I have personally reviewed following labs and imaging studies  CBC: Recent Labs  Lab 10/05/24 0426 10/06/24 0751 10/07/24 0408 10/10/24 0401  WBC 3.9* 3.8* 3.5* 5.1  NEUTROABS  --   --  1.7 2.2  HGB 6.8* 9.2* 9.0* 8.8*  HCT 20.5* 26.5* 26.3* 26.9*  MCV 86.1 84.7 85.4 87.1  PLT 118* 103* 101* 86*   Basic Metabolic Panel: Recent Labs  Lab 10/05/24 0426 10/06/24 0751 10/07/24 0408 10/10/24 0401  NA 136 135 133* 135  K 3.7 3.7 3.8 4.6  CL 100 98 97* 96*   CO2 24 21* 22 19*  GLUCOSE 131* 97 118* 148*  BUN 10 10 13 23   CREATININE 0.62 0.57 0.57 0.75  CALCIUM 8.3* 8.6* 8.4* 8.7*  MG 2.1 2.1 2.0 2.3  PHOS  --  2.3*  --  2.3*   GFR: Estimated Creatinine Clearance: 51.1 mL/min (by C-G formula based on SCr of 0.75 mg/dL). Liver Function Tests: Recent Labs  Lab 10/05/24 0426 10/06/24 0751 10/07/24 0408  AST 28  --  29  ALT 7  --  7  ALKPHOS 65  --  62  BILITOT 0.5  --  0.6  PROT 5.5*  --  5.6*  ALBUMIN 2.9* 3.0* 2.8*   No results for input(s): LIPASE, AMYLASE in the last 168 hours. No results for input(s): AMMONIA in the last 168 hours.  Coagulation Profile: No results for input(s): INR, PROTIME in the last 168 hours. Cardiac Enzymes: Recent Labs  Lab 10/05/24 0426  CKTOTAL 31*   CBG: Recent Labs  Lab 10/10/24 0748 10/10/24 1152 10/10/24 1702 10/10/24 1959 10/11/24 0735  GLUCAP 140* 141* 140* 142* 156*   Sepsis Labs: Recent Labs  Lab 10/07/24 0408  PROCALCITON 0.90    Recent Results (from the past 240 hours)  Blood Culture (routine x 2)     Status: None   Collection Time: 10/03/24  5:51  PM   Specimen: BLOOD  Result Value Ref Range Status   Specimen Description   Final    BLOOD LEFT ANTECUBITAL Performed at [facility], [address] [place], [place] [address]    Special Requests   Final    BOTTLES DRAWN AEROBIC AND ANAEROBIC Blood Culture adequate volume Performed at [facility], [address] [place], [place] [address]    Culture   Final    NO GROWTH 5 DAYS Performed at [facility], [address], [place], [place] [address]    Report Status 10/08/2024 FINAL  Final  Resp panel by RT-PCR (RSV, Flu A&B, Covid) Anterior Nasal Swab     Status: None   Collection Time: 10/03/24  6:50 PM   Specimen: Anterior Nasal Swab  Result Value Ref Range Status   SARS Coronavirus 2 by RT PCR NEGATIVE NEGATIVE Final    Comment: (NOTE) SARS-CoV-2 target  nucleic acids are NOT DETECTED.  The SARS-CoV-2 RNA is generally detectable in upper respiratory specimens during the acute phase of infection. The lowest concentration of SARS-CoV-2 viral copies this assay can detect is 138 copies/mL. A negative result does not preclude SARS-Cov-2 infection and should not be used as the sole basis for treatment or other patient management decisions. A negative result may occur with  improper specimen collection/handling, submission of specimen other than nasopharyngeal swab, presence of viral mutation(s) within the areas targeted by this assay, and inadequate number of viral copies(<138 copies/mL). A negative result must be combined with clinical observations, patient history, and epidemiological information. The expected result is Negative.  Fact Sheet for Patients:  bloggercourse.com  Fact Sheet for Healthcare Providers:  seriousbroker.it  This test is no t yet approved or cleared by the [place]  FDA and  has been authorized for detection and/or diagnosis of SARS-CoV-2 by FDA under an Emergency Use Authorization (EUA). This EUA will remain  in effect (meaning this test can be used) for the duration of the COVID-19 declaration under Section 564(b)(1) of the Act, 21 U.S.C.section 360bbb-3(b)(1), unless the authorization is terminated  or revoked sooner.       Influenza A by PCR NEGATIVE NEGATIVE Final   Influenza B by PCR NEGATIVE NEGATIVE Final    Comment: (NOTE) The Xpert Xpress SARS-CoV-2/FLU/RSV plus assay is intended as an aid in the diagnosis of influenza from Nasopharyngeal swab specimens and should not be used as a sole basis for treatment. Nasal washings and aspirates are unacceptable for Xpert Xpress SARS-CoV-2/FLU/RSV testing.  Fact Sheet for Patients: bloggercourse.com  Fact Sheet for Healthcare  Providers: seriousbroker.it  This test is not yet approved or cleared by the [place]  FDA and has been authorized for detection and/or diagnosis of SARS-CoV-2 by FDA under an Emergency Use Authorization (EUA). This EUA will remain in effect (meaning this test can be used) for the duration of the COVID-19 declaration under Section 564(b)(1) of the Act, 21 U.S.C. section 360bbb-3(b)(1), unless the authorization is terminated or revoked.     Resp Syncytial Virus by PCR NEGATIVE NEGATIVE Final    Comment: (NOTE) Fact Sheet for Patients: bloggercourse.com  Fact Sheet for Healthcare Providers: seriousbroker.it  This test is not yet approved or cleared by the [place]  FDA and has been authorized for detection and/or diagnosis of SARS-CoV-2 by FDA under an Emergency Use Authorization (EUA). This EUA will remain in effect (meaning this test can be used) for the duration of the COVID-19 declaration under Section 564(b)(1) of the  Act, 21 U.S.C. section 360bbb-3(b)(1), unless the authorization is terminated or revoked.  Performed at [facility], [address], [place], [place] [address]   Urine Culture     Status: None   Collection Time: 10/03/24  8:55 PM   Specimen: Urine, Random  Result Value Ref Range Status   Specimen Description   Final    URINE, RANDOM Performed at [facility], [address] [place], [place] [address]    Special Requests   Final    NONE Reflexed from [id number] Performed at [facility], [address], [place], [place] [address]    Culture   Final    NO GROWTH Performed at [facility], [address], [place], [place] [address]    Report Status 10/05/2024 FINAL  Final  Blood Culture (routine x 2)     Status: None   Collection Time: 10/04/24  4:31 AM   Specimen: BLOOD LEFT HAND  Result Value Ref Range Status    Specimen Description   Final    BLOOD LEFT HAND Performed at [facility], [address] [place], [place] [address]    Special Requests   Final    Blood Culture adequate volume BOTTLES DRAWN AEROBIC AND ANAEROBIC Performed at [facility], [address] [place], [place] [address]    Culture   Final    NO GROWTH 5 DAYS Performed at [facility], [address], [place], [place] [address]    Report Status 10/09/2024 FINAL  Final  Culture, blood (Routine X 2) w Reflex to ID Panel     Status: None (Preliminary result)   Collection Time: 10/06/24  1:45 PM   Specimen: BLOOD LEFT ARM  Result Value Ref Range Status   Specimen Description   Final    BLOOD LEFT ARM Performed at [facility], [address] [place], [place] [address]    Special Requests   Final    BOTTLES DRAWN AEROBIC ONLY Blood Culture adequate volume Performed at [facility], [address] [place], [place] [address]    Culture   Final    NO GROWTH 4 DAYS Performed at [facility], [address], [place], [place] [address]    Report Status PENDING  Incomplete  Culture, blood (Routine X 2) w Reflex to ID Panel     Status: None (Preliminary result)   Collection Time: 10/06/24  1:56 PM   Specimen: BLOOD LEFT ARM  Result Value Ref Range Status   Specimen Description   Final    BLOOD LEFT ARM Performed at [facility], [address] [place], [place] [address]    Special Requests   Final    BOTTLES DRAWN AEROBIC AND ANAEROBIC Blood Culture adequate volume Performed at [facility], [address], [place], [place] [address]    Culture   Final    NO GROWTH 4 DAYS Performed at [facility], [address], [place], [place] [address]    Report Status PENDING  Incomplete  Body fluid culture w Gram Stain     Status: None   Collection Time: 10/07/24 12:24 PM   Specimen: PATH Cytology Pleural fluid   Result Value Ref Range Status   Specimen Description   Final    PLEURAL Performed at [facility], [address], [place], [place] [address]    Special Requests  Final    NONE Performed at [facility], [address], [place], [place] [address]    Gram Stain   Final    FEW WBC PRESENT, PREDOMINANTLY MONONUCLEAR NO ORGANISMS SEEN    Culture   Final    NO GROWTH 3 DAYS Performed at [facility], [address], [place], [place] [address]    Report Status 10/10/2024 FINAL  Final  Respiratory (~20 pathogens) panel by PCR     Status: None   Collection Time: 10/07/24 11:59 PM   Specimen: Nasopharyngeal Swab; Respiratory  Result Value Ref Range Status   Adenovirus NOT DETECTED NOT DETECTED Final   Coronavirus 229E NOT DETECTED NOT DETECTED Final    Comment: (NOTE) The Coronavirus on the Respiratory Panel, DOES NOT test for the novel  Coronavirus (2019 nCoV)    Coronavirus HKU1 NOT DETECTED NOT DETECTED Final   Coronavirus NL63 NOT DETECTED NOT DETECTED Final   Coronavirus OC43 NOT DETECTED NOT DETECTED Final   Metapneumovirus NOT DETECTED NOT DETECTED Final   Rhinovirus / Enterovirus NOT DETECTED NOT DETECTED Final   Influenza A NOT DETECTED NOT DETECTED Final   Influenza B NOT DETECTED NOT DETECTED Final   Parainfluenza Virus 1 NOT DETECTED NOT DETECTED Final   Parainfluenza Virus 2 NOT DETECTED NOT DETECTED Final   Parainfluenza Virus 3 NOT DETECTED NOT DETECTED Final   Parainfluenza Virus 4 NOT DETECTED NOT DETECTED Final   Respiratory Syncytial Virus NOT DETECTED NOT DETECTED Final   Bordetella pertussis NOT DETECTED NOT DETECTED Final   Bordetella Parapertussis NOT DETECTED NOT DETECTED Final   Chlamydophila pneumoniae NOT DETECTED NOT DETECTED Final   Mycoplasma pneumoniae NOT DETECTED NOT DETECTED Final    Comment: Performed at [facility], [address], [place], [place] [address]         Radiology  Studies: VAS US  UPPER EXTREMITY VENOUS DUPLEX Result Date: 10/10/2024 UPPER VENOUS STUDY  Patient Name:  TIFFNEY HAUGHTON  Date of Exam:   10/09/2024 Medical Rec #: [id number]         Accession #:    [id number] Date of Birth: Jan 18, 1946        Patient Gender: F Patient Age:   11 years Exam Location:  [facility] Procedure:      VAS US  UPPER EXTREMITY VENOUS DUPLEX Referring Phys: ANNALEE OREM --------------------------------------------------------------------------------  Indications: Fevers of unclear cause. Other Indications: Chronic myeloid leukemia. Comparison Study: No prior exam. Performing Technologist: Edilia Elden Appl  Examination Guidelines: A complete evaluation includes B-mode imaging, spectral Doppler, color Doppler, and power Doppler as needed of all accessible portions of each vessel. Bilateral testing is considered an integral part of a complete examination. Limited examinations for reoccurring indications may be performed as noted.  Right Findings: +----------+------------+---------+-----------+----------+-------+ RIGHT     CompressiblePhasicitySpontaneousPropertiesSummary +----------+------------+---------+-----------+----------+-------+ IJV           Full       Yes       Yes                      +----------+------------+---------+-----------+----------+-------+ Subclavian               Yes       Yes                      +----------+------------+---------+-----------+----------+-------+ Axillary      Full       Yes       Yes                      +----------+------------+---------+-----------+----------+-------+  Brachial      Full       Yes       Yes                      +----------+------------+---------+-----------+----------+-------+ Radial        Full                                          +----------+------------+---------+-----------+----------+-------+ Ulnar         Full                                           +----------+------------+---------+-----------+----------+-------+ Cephalic      None       No        No                       +----------+------------+---------+-----------+----------+-------+ Basilic       Full       Yes       Yes                      +----------+------------+---------+-----------+----------+-------+ Superficial vein thrombosis noted in the right cephalic vein at the distal upper arm to the antecubital fossa by IV line.  Left Findings: +----------+------------+---------+-----------+----------+-------+ LEFT      CompressiblePhasicitySpontaneousPropertiesSummary +----------+------------+---------+-----------+----------+-------+ IJV           Full       Yes       Yes                      +----------+------------+---------+-----------+----------+-------+ Subclavian               Yes       Yes                      +----------+------------+---------+-----------+----------+-------+ Axillary      Full       Yes       Yes                      +----------+------------+---------+-----------+----------+-------+ Brachial      Full       Yes       Yes                      +----------+------------+---------+-----------+----------+-------+ Radial        Full                                          +----------+------------+---------+-----------+----------+-------+ Ulnar         Full                                          +----------+------------+---------+-----------+----------+-------+ Cephalic      None       No        No                       +----------+------------+---------+-----------+----------+-------+ Basilic  Full       Yes       Yes                      +----------+------------+---------+-----------+----------+-------+ Superficial vein thrombosis noted in the left cephalic vein at the antecubital fossa to the proximal forearm.  Summary:  Right: No evidence of deep vein thrombosis in the upper extremity. Findings  consistent with acute superficial vein thrombosis involving the right cephalic vein.  Left: No evidence of deep vein thrombosis in the upper extremity. Findings consistent with acute superficial vein thrombosis involving the left cephalic vein.  *See table(s) above for measurements and observations.  Diagnosing physician: Debby Robertson Electronically signed by Debby Robertson on 10/10/2024 at 8:10:58 AM.    Final    Scheduled Meds:  (feeding supplement) PROSource Plus  30 mL Oral BID BM   aspirin  EC  81 mg Oral Daily   enoxaparin  (LOVENOX ) injection  40 mg Subcutaneous Q24H   feeding supplement  1 Container Oral BID BM   folic acid  1 mg Oral Daily   insulin  aspart  0-6 Units Subcutaneous TID WC   latanoprost   1 drop Both Eyes QHS   midodrine   5 mg Oral TID WC   multivitamin with minerals  1 tablet Oral Daily   phosphorus  250 mg Oral BID   polyethylene glycol  17 g Oral BID   senna-docusate  1 tablet Oral BID   Continuous Infusions:   LOS: 8 days   Time spent:  Elsie JAYSON Montclair, DO [facility]  If 7PM-7AM, please contact night-coverage [email or web address]  10/11/2024, 8:24 AM

## 2024-10-11 NOTE — Progress Notes (Signed)
   10/11/24 0831  Assess: MEWS Score  Temp 98.6 F (37 C)  BP (!) 94/56  MAP (mmHg) 66  Pulse Rate (!) 117  ECG Heart Rate (!) 117  Resp 20  Level of Consciousness Alert  SpO2 99 %  O2 Device Nasal Cannula  Patient Activity (if Appropriate) In bed  O2 Flow Rate (L/min) 2 L/min  Assess: MEWS Score  MEWS Temp 0  MEWS Systolic 1  MEWS Pulse 2  MEWS RR 0  MEWS LOC 0  MEWS Score 3  MEWS Score Color Yellow  Assess: if the MEWS score is Yellow or Red  Were vital signs accurate and taken at a resting state? Yes  Does the patient meet 2 or more of the SIRS criteria? No  Does the patient have a confirmed or suspected source of infection? No  MEWS guidelines implemented  No, previously yellow, continue vital signs every 4 hours  Notify: Charge Nurse/RN  Name of Charge Nurse/RN Notified Lonell Alstrom, RN  Assess: SIRS CRITERIA  SIRS Temperature  0  SIRS Respirations  0  SIRS Pulse 1  SIRS WBC 0  SIRS Score Sum  1

## 2024-10-11 NOTE — Significant Event (Signed)
 Requested by RN to check on patient. No signs of pain or distress at this time. Reinforced delirium precautions with staff. Continue current plan of care. Alan LITTIE Portugal, RN

## 2024-10-12 ENCOUNTER — Inpatient Hospital Stay (HOSPITAL_COMMUNITY)

## 2024-10-12 DIAGNOSIS — Z7189 Other specified counseling: Secondary | ICD-10-CM

## 2024-10-12 DIAGNOSIS — Z711 Person with feared health complaint in whom no diagnosis is made: Secondary | ICD-10-CM

## 2024-10-12 DIAGNOSIS — G9340 Encephalopathy, unspecified: Secondary | ICD-10-CM | POA: Diagnosis not present

## 2024-10-12 DIAGNOSIS — Z79899 Other long term (current) drug therapy: Secondary | ICD-10-CM

## 2024-10-12 DIAGNOSIS — Z66 Do not resuscitate: Secondary | ICD-10-CM

## 2024-10-12 DIAGNOSIS — Z515 Encounter for palliative care: Secondary | ICD-10-CM

## 2024-10-12 LAB — BASIC METABOLIC PANEL WITH GFR
Anion gap: 20 — ABNORMAL HIGH (ref 5–15)
BUN: 35 mg/dL — ABNORMAL HIGH (ref 8–23)
CO2: 20 mmol/L — ABNORMAL LOW (ref 22–32)
Calcium: 9 mg/dL (ref 8.9–10.3)
Chloride: 96 mmol/L — ABNORMAL LOW (ref 98–111)
Creatinine, Ser: 0.95 mg/dL (ref 0.44–1.00)
GFR, Estimated: >60 mL/min (ref >60)
Glucose, Bld: 176 mg/dL — ABNORMAL HIGH (ref 70–99)
Potassium: 4.8 mmol/L (ref 3.5–5.1)
Sodium: 136 mmol/L (ref 135–145)

## 2024-10-12 LAB — GLUCOSE, CAPILLARY: Glucose-Capillary: 176 mg/dL — ABNORMAL HIGH (ref 70–99)

## 2024-10-12 MED ORDER — FUROSEMIDE 10 MG/ML IJ SOLN
40.0000 mg | Freq: Two times a day (BID) | INTRAMUSCULAR | Status: AC
  Administered 2024-10-12 (×2): 40 mg via INTRAVENOUS
  Filled 2024-10-12 (×2): qty 4

## 2024-10-12 MED ORDER — LACTATED RINGERS IV SOLN: INTRAVENOUS | Status: DC

## 2024-10-12 MED ORDER — LORAZEPAM 2 MG/ML IJ SOLN: 1.0000 mg | INTRAMUSCULAR | Status: DC | PRN

## 2024-10-12 MED ORDER — IPRATROPIUM-ALBUTEROL 0.5-2.5 (3) MG/3ML IN SOLN: 3.0000 mL | RESPIRATORY_TRACT | Status: DC | PRN

## 2024-10-12 MED ORDER — BIOTENE DRY MOUTH MT LIQD: 15.0000 mL | OROMUCOSAL | Status: DC | PRN

## 2024-10-12 MED ORDER — HALOPERIDOL LACTATE 5 MG/ML IJ SOLN: 1.0000 mg | INTRAMUSCULAR | Status: DC | PRN

## 2024-10-12 MED ORDER — POLYVINYL ALCOHOL 1.4 % OP SOLN: 1.0000 [drp] | Freq: Four times a day (QID) | OPHTHALMIC | Status: DC | PRN

## 2024-10-12 MED ORDER — HYDROMORPHONE HCL 1 MG/ML IJ SOLN
0.5000 mg | INTRAMUSCULAR | Status: DC | PRN
  Administered 2024-10-12 (×5): 1 mg via INTRAVENOUS
  Filled 2024-10-12 (×5): qty 1

## 2024-10-12 MED ORDER — BISACODYL 10 MG RE SUPP
10.0000 mg | Freq: Every day | RECTAL | Status: DC | PRN
Start: 2024-10-12 — End: 2024-10-13

## 2024-10-12 MED ORDER — GLYCOPYRROLATE 0.2 MG/ML IJ SOLN: 0.2000 mg | INTRAMUSCULAR | Status: DC | PRN

## 2024-10-24 NOTE — Progress Notes (Signed)
 Nutrition Brief Note  Chart reviewed. Pt now transitioning to comfort care.  No further nutrition interventions planned at this time.  Please re-consult as needed.   Shelle Iron RD, LDN Contact via [employer].

## 2024-10-24 NOTE — Death Summary Note (Signed)
 DEATH SUMMARY   Patient Details  Name: Erica [name] MRN: [id number] DOB: 1946-07-19 ERE:Yjmmpd, Elsie SAUNDERS, MD Admission/Discharge Information   Admit Date:  October 21, 2024  Date of Death: Date of Death: 10/30/24  Time of Death: Time of Death: 11-03-2218  Length of Stay: 9   Principle Cause of death:  Adult failure to thrive Severe protein calorie malnutrition Symptomatic anemia Community-acquired pneumonia History of CML    Hospital Diagnoses: Principal Problem:   Acute encephalopathy Active Problems:   Symptomatic anemia   Chronic myeloid leukemia (HCC)   Pleural effusion   Diabetes mellitus type 2 in nonobese [facility])   Essential hypertension   Peripheral neuropathy   Acute on chronic heart failure with preserved ejection fraction (HFpEF) (HCC)   UTI (urinary tract infection)   Pneumonia   Pressure injury of skin   Dyspnea   Palliative care encounter   Goals of care, counseling/discussion   DNR (do not resuscitate)   Concern about end of life   High risk medication use   Medication management   Counseling and coordination of care  Brief Narrative:    Erica [name] is a 78 y.o. 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 ED from home with profound generalized weakness, confusion, increased lower extremity edema, fall at home.  Recently hospitalized with symptomatic anemia, sepsis treated with empiric antibiotics and blood transfusion 2 weeks prior.   In the ED, temperature 9 9.6 F, HR 105, RR 16, BP 115/55, SpO2 99% on room air.  VBG with pH 7.38, pCO2 45, pO2 less than 31.  WBC 4.7, hemoglobin 6.5, plate count 850.  Sodium 133, potassium 4.3, chloride 95, CO2 26, glucose 153, BUN 13, creatinine 0.67.  AST 37, ALT 9, total bilirubin 0.7.  Lactic acid 1.9.  COVID/influenza/RSV PCR negative.  Urinalysis with trace leukocytes, negative nitrite, many bacteria, 11-20 WBCs.  FOBT negative.  CT  head without contrast with no acute intracranial abnormality.  Blood cultures were drawn.  Patient was started on azithromycin and ceftriaxone.  MRI brain, lower extremity venous Dopplers were ordered.  TRH was consulted for admission.   Hospital Course:  Acute metabolic encephalopathy, POA Nonspecific diffusion abnormality MRI Patient presenting with confusion, CT head without contrast negative.  Also concern for infection with pneumonia and UTI which was adequately treated with no significant improvement in mentation.  MRI brain without contrast with punctate left body corpus callosum diffusion abnormality suspected although not well correlated on T2/FLAIR which is nonspecific with possible etiologies including metabolic disturbance, small vessel ischemia, seizure, infection, demyelination or radiation/chemotherapy.  Seen by neurology, underwent EEG with no seizure or epileptiform discharges noted.  Neurology believes delirium/confusion in the setting of her underlying medical issues and signed off.  Given failure to thrive, refusing oral intake and medical directives previously accomplished with requesting no heroic measures or artificial measures to continue survival including feeding tube, discussion with patient's son and patient was transition to comfort measures.     Adult failure to thrive Severe protein calorie malnutrition Body mass index is 32.42 kg/m. Nutrition Status: Nutrition Problem: Inadequate oral intake Etiology: poor appetite, lethargy/confusion Signs/Symptoms: meal completion < 25% Interventions: Refer to RD note for recommendations, Boost Breeze, MVI Patient was seen by speech therapy, has severe dysphagia and unable to tolerate oral intake safely.  Patient has medical directives within the EMR advising against artificial means of nutrition including feeding tubes.  Given that she is  unable to tolerate any oral intake with progressive failure to thrive, transitioning to  comfort measures in which patient's son [facility]) in agreement.   Symptomatic anemia Hx CML On Ponatinib  at baseline.  Transfused 1 unit PRBC on 11/11 followed by an additional 1 unit on 11/12.   Sepsis, POA Commune acquired pneumonia Possible urinary tract infection Right pleural effusion Patient presenting with fever, tachycardia.  Chest x-ray with hazy patchy opacity right lung base and small right pleural effusion.  Urine culture not performed.  Blood cultures remain negative.  Underwent thoracentesis on 11/14 with 500 mL removed, pleural fluid culture negative.  Completed 5-day course of azithromycin  and 7-day course of IV ceftriaxone .  Seen by infectious disease with recommendation of no further antibiotics or further workup.   DM2 Hemoglobin A1c 6.9% on 10/04/2024.    HLD Not on medication outpatient.       Procedures: EEG, thoracentesis  Consultations:  Neurology Infectious disease Palliative care Interventional radiology  The results of significant diagnostics from this hospitalization (including imaging, microbiology, ancillary and laboratory) are listed below for reference.   Significant Diagnostic Studies: DG CHEST PORT 1 VIEW Result Date: 03-Nov-2024 EXAM: 1 VIEW(S) XRAY OF THE CHEST November 03, 2024 10:31:43 AM COMPARISON: 10/07/2024 CLINICAL HISTORY: SOB (shortness of breath) FINDINGS: LUNGS AND PLEURA: Increased bibasilar heterogeneous opacities. Increased interstitial markings, favoring pulmonary edema. Increased small to moderate right pleural effusion. Persistent small left pleural effusion. No pneumothorax. HEART AND MEDIASTINUM: Cardiomegaly. No acute abnormality of the mediastinal silhouette. BONES AND SOFT TISSUES: No acute osseous abnormality. IMPRESSION: 1. Increased bibasilar opacities and interstitial markings, favoring pulmonary edema. 2. Increased right pleural effusion and persistent small left pleural effusion. 3. Cardiomegaly. Electronically signed by:  Waddell Calk MD 2024/11/03 01:46 PM EST RP Workstation: [id number]   VAS US  UPPER EXTREMITY VENOUS DUPLEX Result Date: 10/10/2024 UPPER VENOUS STUDY  Patient Name:  Erica [name]  Date of Exam:   10/09/2024 Medical Rec #: [id number]         Accession #:    [id number] Date of Birth: 15-Mar-1946        Patient Gender: F Patient Age:   44 years Exam Location:  [facility] Procedure:      VAS US  UPPER EXTREMITY VENOUS DUPLEX Referring Phys: ANNALEE OREM --------------------------------------------------------------------------------  Indications: Fevers of unclear cause. Other Indications: Chronic myeloid leukemia. Comparison Study: No prior exam. Performing Technologist: Edilia Elden Appl  Examination Guidelines: A complete evaluation includes B-mode imaging, spectral Doppler, color Doppler, and power Doppler as needed of all accessible portions of each vessel. Bilateral testing is considered an integral part of a complete examination. Limited examinations for reoccurring indications may be performed as noted.  Right Findings: +----------+------------+---------+-----------+----------+-------+ RIGHT     CompressiblePhasicitySpontaneousPropertiesSummary +----------+------------+---------+-----------+----------+-------+ IJV           Full       Yes       Yes                      +----------+------------+---------+-----------+----------+-------+ Subclavian               Yes       Yes                      +----------+------------+---------+-----------+----------+-------+ Axillary      Full       Yes       Yes                      +----------+------------+---------+-----------+----------+-------+  Brachial      Full       Yes       Yes                      +----------+------------+---------+-----------+----------+-------+ Radial        Full                                          +----------+------------+---------+-----------+----------+-------+ Ulnar          Full                                          +----------+------------+---------+-----------+----------+-------+ Cephalic      None       No        No                       +----------+------------+---------+-----------+----------+-------+ Basilic       Full       Yes       Yes                      +----------+------------+---------+-----------+----------+-------+ Superficial vein thrombosis noted in the right cephalic vein at the distal upper arm to the antecubital fossa by IV line.  Left Findings: +----------+------------+---------+-----------+----------+-------+ LEFT      CompressiblePhasicitySpontaneousPropertiesSummary +----------+------------+---------+-----------+----------+-------+ IJV           Full       Yes       Yes                      +----------+------------+---------+-----------+----------+-------+ Subclavian               Yes       Yes                      +----------+------------+---------+-----------+----------+-------+ Axillary      Full       Yes       Yes                      +----------+------------+---------+-----------+----------+-------+ Brachial      Full       Yes       Yes                      +----------+------------+---------+-----------+----------+-------+ Radial        Full                                          +----------+------------+---------+-----------+----------+-------+ Ulnar         Full                                          +----------+------------+---------+-----------+----------+-------+ Cephalic      None       No        No                       +----------+------------+---------+-----------+----------+-------+ Basilic  Full       Yes       Yes                      +----------+------------+---------+-----------+----------+-------+ Superficial vein thrombosis noted in the left cephalic vein at the antecubital fossa to the proximal forearm.  Summary:  Right: No evidence of deep vein  thrombosis in the upper extremity. Findings consistent with acute superficial vein thrombosis involving the right cephalic vein.  Left: No evidence of deep vein thrombosis in the upper extremity. Findings consistent with acute superficial vein thrombosis involving the left cephalic vein.  *See table(s) above for measurements and observations.  Diagnosing physician: Debby Robertson Electronically signed by Debby Robertson on 10/10/2024 at 8:10:58 AM.    Final    ECHOCARDIOGRAM COMPLETE Result Date: 10/08/2024    ECHOCARDIOGRAM REPORT   Patient Name:   Erica [name] Date of Exam: 10/08/2024 Medical Rec #:  [id number]        Height:       59.0 in Accession #:    [id number]       Weight:       160.5 lb Date of Birth:  12/17/1945       BSA:          1.680 m Patient Age:    77 years         BP:           119/56 mmHg Patient Gender: F                HR:           109 bpm. Exam Location:  Inpatient Procedure: 2D Echo (Both Spectral and Color Flow Doppler were utilized during            procedure). Indications:    Dyspnea  History:        Patient has prior history of Echocardiogram examinations. CHF;                 Risk Factors:Hypertension.  Sonographer:    Charmaine Gaskins Referring Phys: [id number] RUBENS C CHANG IMPRESSIONS  1. Left ventricular ejection fraction, by estimation, is 70 to 75%. The left ventricle has hyperdynamic function. Mild left ventricular hypertrophy. Left ventricular diastolic parameters are indeterminate. Intracavitary gradient measures at rest  2. Right ventricular systolic function is normal. The right ventricular size is normal. There is moderately elevated pulmonary artery systolic pressure. The estimated right ventricular systolic pressure is 49.8 mmHg.  3. Moderate pericardial effusion. There is no evidence of cardiac tamponade.  4. The mitral valve is normal in structure. Trivial mitral valve regurgitation.  5. Tricuspid valve regurgitation is mild to moderate.  6. The aortic valve is  tricuspid. Aortic valve regurgitation is not visualized. Aortic valve sclerosis/calcification is present, without any evidence of aortic stenosis.  7. The inferior vena cava is normal in size with greater than 50% respiratory variability, suggesting right atrial pressure of 3 mmHg. FINDINGS  Left Ventricle: Left ventricular ejection fraction, by estimation, is 70 to 75%. The left ventricle has hyperdynamic function. The left ventricle has no regional wall motion abnormalities. The left ventricular internal cavity size was normal in size. There is mild left ventricular hypertrophy. Left ventricular diastolic parameters are indeterminate. Right Ventricle: The right ventricular size is normal. No increase in right ventricular wall thickness. Right ventricular systolic function is normal. There is moderately elevated pulmonary artery systolic pressure. The tricuspid regurgitant velocity is 3.42 m/s, and with  an assumed right atrial pressure of 3 mmHg, the estimated right ventricular systolic pressure is 49.8 mmHg. Left Atrium: Left atrial size was normal in size. Right Atrium: Right atrial size was normal in size. Pericardium: A moderately sized pericardial effusion is present. There is no evidence of cardiac tamponade. Mitral Valve: The mitral valve is normal in structure. Trivial mitral valve regurgitation. Tricuspid Valve: The tricuspid valve is normal in structure. Tricuspid valve regurgitation is mild to moderate. Aortic Valve: The aortic valve is tricuspid. Aortic valve regurgitation is not visualized. Aortic valve sclerosis/calcification is present, without any evidence of aortic stenosis. Pulmonic Valve: The pulmonic valve was normal in structure. Pulmonic valve regurgitation is trivial. Aorta: The aortic root and ascending aorta are structurally normal, with no evidence of dilitation. Venous: The inferior vena cava is normal in size with greater than 50% respiratory variability, suggesting right atrial pressure  of 3 mmHg. IAS/Shunts: The interatrial septum was not well visualized.  LEFT VENTRICLE PLAX 2D LVOT diam:     2.10 cm   Diastology LVOT Area:     3.46 cm  LV e' medial:    10.80 cm/s                          LV E/e' medial:  7.2                          LV e' lateral:   7.29 cm/s                          LV E/e' lateral: 10.7  RIGHT VENTRICLE RV Basal diam:  2.40 cm RV Mid diam:    2.50 cm RV S prime:     17.70 cm/s LEFT ATRIUM             Index        RIGHT ATRIUM           Index LA Vol (A2C):   51.7 ml 30.78 ml/m  RA Area:     11.70 cm LA Vol (A4C):   34.9 ml 20.78 ml/m  RA Volume:   24.70 ml  14.71 ml/m LA Biplane Vol: 43.6 ml 25.96 ml/m   AORTA Ao Root diam: 3.20 cm Ao Asc diam:  3.30 cm MITRAL VALVE                TRICUSPID VALVE MV Area (PHT): 4.77 cm     TR Peak grad:   46.8 mmHg MV Decel Time: 159 msec     TR Vmax:        342.00 cm/s MV E velocity: 78.00 cm/s MV A velocity: 124.00 cm/s  SHUNTS MV E/A ratio:  0.63         Systemic Diam: 2.10 cm Lonni Nanas MD Electronically signed by Lonni Nanas MD Signature Date/Time: 10/08/2024/3:11:32 PM    Final    US  THORACENTESIS ASP PLEURAL SPACE W/IMG GUIDE Result Date: 10/07/2024 INDICATION: [id number] Pleural effusion on right [id number] Inpatient being treated for pneumonia with bilateral pleural effusion. Team requesting right diagnostic and therapeutic thoracentesis. EXAM: ULTRASOUND GUIDED RIGHT THORACENTESIS MEDICATIONS: 8 mL 1% lidocaine  COMPLICATIONS: None immediate. PROCEDURE: An ultrasound guided thoracentesis was thoroughly discussed with the patient and questions answered. The benefits, risks, alternatives and complications were also discussed. The patient understands and wishes to proceed with the procedure. Written consent was obtained. Ultrasound was performed to localize and mark  an adequate pocket of fluid in the right chest. The area was then prepped and draped in the normal sterile fashion. 1% Lidocaine  was used for local  anesthesia. Under ultrasound guidance a 19 gauge, 7-cm, Yueh catheter was introduced with patient in lateral decubitus position. Thoracentesis was performed. The catheter was removed and a dressing applied. FINDINGS: A total of approximately 550 mL of clear, yellow fluid was removed. Samples were sent to the laboratory as requested by the clinical team. IMPRESSION: Successful ultrasound guided RIGHT thoracentesis yielding 550 mL of pleural fluid. Performed by Laymon Coast, NP under the supervision of Thom Hall, MD Electronically Signed   By: Thom Hall M.D.   On: 10/07/2024 15:55   DG Chest Port 1 View Result Date: 10/07/2024 CLINICAL DATA:  Post thoracentesis. EXAM: PORTABLE CHEST 1 VIEW COMPARISON:  Radiographs 10/06/2024 and 10/03/2024.  CT 09/15/2024. FINDINGS: 1226 hours. The heart size and mediastinal contours are stable. Interval decreased right pleural effusion with improved aeration of the right lung base. No evidence of pneumothorax. Probable residual small bilateral pleural effusions with left greater than right basilar airspace opacities. The bones appear unchanged. IMPRESSION: Interval decreased right pleural effusion with improved aeration of the right lung base. No evidence of pneumothorax. Electronically Signed   By: Elsie Perone M.D.   On: 10/07/2024 12:58   DG CHEST PORT 1 VIEW Result Date: 10/06/2024 EXAM: 1 VIEW(S) XRAY OF THE CHEST 10/06/2024 10:41:00 AM COMPARISON: 10/03/2024 CLINICAL HISTORY: Pneumonia FINDINGS: LUNGS AND PLEURA: Blunting of both costophrenic sulci with increased airspace opacities in the lung bases. No pneumothorax. HEART AND MEDIASTINUM: Mild cardiomegaly. BONES AND SOFT TISSUES: Multilevel degenerative disc disease of the spine. IMPRESSION: 1. Increasing hazy airspace opacities in both lung bases with blunting of both costophrenic sulci, possibly representing worsening small effusions with bibasilar atelectasis. Electronically signed by: Rogelia Myers  MD 10/06/2024 04:53 PM EST RP Workstation: [id number]   EEG adult Result Date: 10/05/2024 Shelton Arlin KIDD, MD     10/05/2024  4:10 PM Patient Name: Erica [name] MRN: [id number] Epilepsy Attending: Arlin KIDD Shelton Referring Physician/Provider: Michaela Aisha SQUIBB, MD Date: 10/05/2024 Duration: 24 minutes Patient history: 78 year old female with altered mental status.  EEG to assess for seizure. Level of alertness: Awake, asleep AEDs during EEG study: None Technical aspects: This EEG study was done with scalp electrodes positioned according to the 10-20 International system of electrode placement. Electrical activity was reviewed with band pass filter of 1-70Hz , sensitivity of 7 uV/mm, display speed of 62mm/sec with a 60Hz  notched filter applied as appropriate. EEG data were recorded continuously and digitally stored.  Video monitoring was available and reviewed as appropriate. Description: The posterior dominant rhythm consists of 8-9Hz  activity of moderate voltage (25-35 uV) seen predominantly in posterior head regions, symmetric and reactive to eye opening and eye closing. Sleep was characterized by vertex waves, sleep spindles (12 to 14 Hz), maximal frontocentral region. Hyperventilation and photic stimulation were not performed.   IMPRESSION: This study is within normal limits. No seizures or epileptiform discharges were seen throughout the recording. A normal interictal EEG does not exclude the diagnosis of epilepsy. Priyanka O Yadav   VAS US  LOWER EXTREMITY VENOUS (DVT) Result Date: 10/04/2024  Lower Venous DVT Study Patient Name:  Erica [name]  Date of Exam:   10/04/2024 Medical Rec #: [id number]         Accession #:    [id number] Date of Birth: August 02, 1946        Patient Gender: F Patient  Age:   89 years Exam Location:  [facility] Procedure:      VAS US  LOWER EXTREMITY VENOUS (DVT) Referring Phys: REDIA CLEAVER  --------------------------------------------------------------------------------  Indications: Edema.  Risk Factors: Diabetes. Comparison Study: 11/20/23 - Negative Performing Technologist: Ricka Sturdivant-Jones RDMS, RVT  Examination Guidelines: A complete evaluation includes B-mode imaging, spectral Doppler, color Doppler, and power Doppler as needed of all accessible portions of each vessel. Bilateral testing is considered an integral part of a complete examination. Limited examinations for reoccurring indications may be performed as noted. The reflux portion of the exam is performed with the patient in reverse Trendelenburg.  +---------+---------------+---------+-----------+----------+--------------+ RIGHT    CompressibilityPhasicitySpontaneityPropertiesThrombus Aging +---------+---------------+---------+-----------+----------+--------------+ CFV      Full           Yes      Yes                                 +---------+---------------+---------+-----------+----------+--------------+ SFJ      Full                                                        +---------+---------------+---------+-----------+----------+--------------+ FV Prox  Full                                                        +---------+---------------+---------+-----------+----------+--------------+ FV Mid   Full           Yes      Yes                                 +---------+---------------+---------+-----------+----------+--------------+ FV DistalFull                                                        +---------+---------------+---------+-----------+----------+--------------+ PFV      Full                                                        +---------+---------------+---------+-----------+----------+--------------+ POP      Full           Yes      Yes                                 +---------+---------------+---------+-----------+----------+--------------+ PTV      Full                                                         +---------+---------------+---------+-----------+----------+--------------+ PERO     Full                                                        +---------+---------------+---------+-----------+----------+--------------+   +---------+---------------+---------+-----------+----------+--------------+  LEFT     CompressibilityPhasicitySpontaneityPropertiesThrombus Aging +---------+---------------+---------+-----------+----------+--------------+ CFV      Full           Yes      Yes                                 +---------+---------------+---------+-----------+----------+--------------+ SFJ      Full                                                        +---------+---------------+---------+-----------+----------+--------------+ FV Prox  Full                                                        +---------+---------------+---------+-----------+----------+--------------+ FV Mid   Full           Yes      Yes                                 +---------+---------------+---------+-----------+----------+--------------+ FV DistalFull                                                        +---------+---------------+---------+-----------+----------+--------------+ PFV      Full                                                        +---------+---------------+---------+-----------+----------+--------------+ POP      Full           Yes      Yes                                 +---------+---------------+---------+-----------+----------+--------------+ PTV      Full                                                        +---------+---------------+---------+-----------+----------+--------------+ PERO     Full                                                        +---------+---------------+---------+-----------+----------+--------------+ Small popliteal fossa cyst measuring 2.3 x 0.80 cm.    Summary:  BILATERAL: - No evidence of deep vein thrombosis seen in the lower extremities, bilaterally. - LEFT: - A cystic structure is found in the popliteal fossa.  *See table(s) above for measurements and observations. Electronically signed by Penne Colorado MD  on 10/04/2024 at 5:05:34 PM.    Final    MR BRAIN WO CONTRAST Result Date: 10/04/2024 EXAM: MRI BRAIN WITHOUT CONTRAST 10/04/2024 08:08:43 AM TECHNIQUE: Multiplanar multisequence MRI of the head/brain was performed without the administration of intravenous contrast. COMPARISON: None available. CLINICAL HISTORY: 78 year old female with confusion and altered mental status. FINDINGS: BRAIN AND VENTRICLES: No acute infarct. No intracranial hemorrhage. No mass. No midline shift. No hydrocephalus. The sella is unremarkable. Normal flow voids. Evidence of a punctate focus within the body of the corpus callosum slightly to the left of midline on series 13 image 23. There is perhaps subtle associated T2 hyperintensity there on coronal images; however, axial FLAIR and T2 do not corroborate the lesion. No other convincing diffusion restriction is identified. This finding is nonspecific and can be seen in the setting of renal failure, hypertension, metabolic disturbances (including hypoglycemia, hypo/hypernatremia), ischemia, seizure, infection, demyelination, radiation, and chemotherapy. Background gray and white matter signal is normal for age. No cortical encephalomalacia or chronic cerebral blood products. ORBITS: No acute abnormality. SINUSES AND MASTOIDS: No acute abnormality. BONES AND SOFT TISSUES: Bone marrow signal is within normal limits. No acute soft tissue abnormality. Cervical spine degeneration is partially visible, including evidence of mild or moderate multifactorial spinal stenosis at C2-C3 related to disc and posterior ligamentous degeneration (series 7 image 13). IMPRESSION: 1. Punctate left body corpus callosum diffusion abnormality suspected, although  not well correlated on T2 / FLAIR. This is nonspecific and possible etiologies include metabolic disturbance, small vessel ischemia, seizure, infection, demyelination, or radiation and chemotherapy. 2. Otherwise Normal for age non-contrast MRI appearance of the brain. Electronically signed by: Helayne Hurst MD 10/04/2024 08:34 AM EST RP Workstation: [id number]   CT Head Wo Contrast Result Date: 10/03/2024 EXAM: CT HEAD WITHOUT CONTRAST 10/03/2024 06:39:09 PM TECHNIQUE: CT of the head was performed without the administration of intravenous contrast. Automated exposure control, iterative reconstruction, and/or weight based adjustment of the mA/kV was utilized to reduce the radiation dose to as low as reasonably achievable. COMPARISON: 09/15/2024 CLINICAL HISTORY: Delirium FINDINGS: BRAIN AND VENTRICLES: No acute hemorrhage. No evidence of acute infarct. No hydrocephalus. No extra-axial collection. No mass effect or midline shift. Generalized volume loss. Vascular calcifications. ORBITS: Bilateral lens replacement. SINUSES: No acute abnormality. SOFT TISSUES AND SKULL: No acute soft tissue abnormality. No skull fracture. IMPRESSION: 1. No acute intracranial abnormality. Electronically signed by: Norman Gatlin MD 10/03/2024 07:14 PM EST RP Workstation: [id number]   DG Chest Port 1 View Result Date: 10/03/2024 CLINICAL DATA:  Questionable sepsis EXAM: PORTABLE CHEST 1 VIEW COMPARISON:  Chest x-ray 09/15/2024 FINDINGS: There is some hazy and patchy opacities in the right lung base which are new from prior. There is likely a small right pleural effusion. Cardiomediastinal silhouette is within normal limits. There is no pneumothorax or acute fracture. IMPRESSION: New hazy and patchy opacities in the right lung base with likely small right pleural effusion. Findings are concerning for pneumonia. Electronically Signed   By: Greig Pique M.D.   On: 10/03/2024 18:41   CT Angio Chest PE W and/or Wo Contrast Result  Date: 09/15/2024 CLINICAL DATA:  Dizziness and fatigue. Concern for pulmonary embolism. Abdominal pain. EXAM: CT ANGIOGRAPHY CHEST CT ABDOMEN AND PELVIS WITH CONTRAST TECHNIQUE: Multidetector CT imaging of the chest was performed using the standard protocol during bolus administration of intravenous contrast. Multiplanar CT image reconstructions and MIPs were obtained to evaluate the vascular anatomy. Multidetector CT imaging of the abdomen and pelvis was performed using the standard protocol  during bolus administration of intravenous contrast. RADIATION DOSE REDUCTION: This exam was performed according to the departmental dose-optimization program which includes automated exposure control, adjustment of the mA and/or kV according to patient size and/or use of iterative reconstruction technique. CONTRAST:  OMNIPAQUE IOHEXOL 350 MG/ML SOLN COMPARISON:  CT abdomen pelvis dated 08/02/2024 FINDINGS: Evaluation of this exam is limited due to respiratory motion and streak artifact caused by patient's arms. CTA CHEST FINDINGS Cardiovascular: There is no cardiomegaly or pericardial effusion. The thoracic aorta is unremarkable. The origins of the great vessels of the aortic arch are patent. Evaluation of the pulmonary arteries is limited due to respiratory motion. No central pulmonary artery embolus identified. Mediastinum/Nodes: No hilar or mediastinal adenopathy. Mild thickened appearance of the esophagus may be related to underdistention. Esophagitis or underlying esophageal mass is not excluded. Clinical correlation is recommended. Lungs/Pleura: Small right pleural effusion. Subsegmental right lung base atelectasis. Pneumonia is not excluded. There is no pneumothorax. The central airways are patent. Musculoskeletal: Degenerative changes of the spine. No acute osseous pathology. Review of the MIP images confirms the above findings. CT ABDOMEN and PELVIS FINDINGS No intra-abdominal free air or free fluid.  Hepatobiliary: Similar appearance of faint hypodense lesions in the left lobe of the liver not characterized on this CT. This can be better evaluated with ultrasound or MRI on a nonemergent/outpatient basis. No biliary dilatation. No calcified gallstone. Pancreas: The pancreas is suboptimally evaluated due to respiratory motion but grossly unremarkable. Spleen: Normal in size without focal abnormality. Adrenals/Urinary Tract: The adrenal glands unremarkable. The kidneys, and urinary bladder unremarkable. Stomach/Bowel: There is no bowel obstruction or active inflammation. The appendix is normal. Vascular/Lymphatic: Mild aortoiliac atherosclerotic disease. The IVC is unremarkable. No portal venous gas. Indeterminate 1.8 x 2.9 cm infiltrative tissue in the portacaval region (20/4) of indeterminate etiology but suspicious for adenopathy or retroperitoneal fibrosis or a neoplastic process. This can be better evaluated with MRI without and with contrast on a nonemergent basis. Reproductive: The uterus is grossly unremarkable. No suspicious adnexal masses. Other: None Musculoskeletal: Degenerative changes of the spine and hips. No acute osseous pathology. IMPRESSION: 1. No CT evidence of central pulmonary artery embolus. 2. Small right pleural effusion with subsegmental right lung base atelectasis. Pneumonia is not excluded. 3. Mild thickened appearance of the esophagus may be related to underdistention. Esophagitis or underlying esophageal mass is not excluded. Endoscopy may provide better evaluation. 4. No bowel obstruction. Normal appendix. 5. Indeterminate 1.8 x 2.9 cm infiltrative tissue in the portacaval region of indeterminate etiology but suspicious for adenopathy or retroperitoneal fibrosis or a neoplastic process. This can be better evaluated with MRI without and with contrast on a nonemergent basis. 6.  Aortic Atherosclerosis (ICD10-I70.0). Electronically Signed   By: Vanetta Chou M.D.   On: 09/15/2024  20:54   CT ABDOMEN PELVIS W CONTRAST Result Date: 09/15/2024 CLINICAL DATA:  Dizziness and fatigue. Concern for pulmonary embolism. Abdominal pain. EXAM: CT ANGIOGRAPHY CHEST CT ABDOMEN AND PELVIS WITH CONTRAST TECHNIQUE: Multidetector CT imaging of the chest was performed using the standard protocol during bolus administration of intravenous contrast. Multiplanar CT image reconstructions and MIPs were obtained to evaluate the vascular anatomy. Multidetector CT imaging of the abdomen and pelvis was performed using the standard protocol during bolus administration of intravenous contrast. RADIATION DOSE REDUCTION: This exam was performed according to the departmental dose-optimization program which includes automated exposure control, adjustment of the mA and/or kV according to patient size and/or use of iterative reconstruction technique. CONTRAST:  OMNIPAQUE IOHEXOL 350 MG/ML SOLN COMPARISON:  CT abdomen pelvis dated 08/02/2024 FINDINGS: Evaluation of this exam is limited due to respiratory motion and streak artifact caused by patient's arms. CTA CHEST FINDINGS Cardiovascular: There is no cardiomegaly or pericardial effusion. The thoracic aorta is unremarkable. The origins of the great vessels of the aortic arch are patent. Evaluation of the pulmonary arteries is limited due to respiratory motion. No central pulmonary artery embolus identified. Mediastinum/Nodes: No hilar or mediastinal adenopathy. Mild thickened appearance of the esophagus may be related to underdistention. Esophagitis or underlying esophageal mass is not excluded. Clinical correlation is recommended. Lungs/Pleura: Small right pleural effusion. Subsegmental right lung base atelectasis. Pneumonia is not excluded. There is no pneumothorax. The central airways are patent. Musculoskeletal: Degenerative changes of the spine. No acute osseous pathology. Review of the MIP images confirms the above findings. CT ABDOMEN and PELVIS FINDINGS No  intra-abdominal free air or free fluid. Hepatobiliary: Similar appearance of faint hypodense lesions in the left lobe of the liver not characterized on this CT. This can be better evaluated with ultrasound or MRI on a nonemergent/outpatient basis. No biliary dilatation. No calcified gallstone. Pancreas: The pancreas is suboptimally evaluated due to respiratory motion but grossly unremarkable. Spleen: Normal in size without focal abnormality. Adrenals/Urinary Tract: The adrenal glands unremarkable. The kidneys, and urinary bladder unremarkable. Stomach/Bowel: There is no bowel obstruction or active inflammation. The appendix is normal. Vascular/Lymphatic: Mild aortoiliac atherosclerotic disease. The IVC is unremarkable. No portal venous gas. Indeterminate 1.8 x 2.9 cm infiltrative tissue in the portacaval region (20/4) of indeterminate etiology but suspicious for adenopathy or retroperitoneal fibrosis or a neoplastic process. This can be better evaluated with MRI without and with contrast on a nonemergent basis. Reproductive: The uterus is grossly unremarkable. No suspicious adnexal masses. Other: None Musculoskeletal: Degenerative changes of the spine and hips. No acute osseous pathology. IMPRESSION: 1. No CT evidence of central pulmonary artery embolus. 2. Small right pleural effusion with subsegmental right lung base atelectasis. Pneumonia is not excluded. 3. Mild thickened appearance of the esophagus may be related to underdistention. Esophagitis or underlying esophageal mass is not excluded. Endoscopy may provide better evaluation. 4. No bowel obstruction. Normal appendix. 5. Indeterminate 1.8 x 2.9 cm infiltrative tissue in the portacaval region of indeterminate etiology but suspicious for adenopathy or retroperitoneal fibrosis or a neoplastic process. This can be better evaluated with MRI without and with contrast on a nonemergent basis. 6.  Aortic Atherosclerosis (ICD10-I70.0). Electronically Signed   By:  Vanetta Chou M.D.   On: 09/15/2024 20:54   CT Head Wo Contrast Result Date: 09/15/2024 CLINICAL DATA:  Poly trauma fall dizzy EXAM: CT HEAD WITHOUT CONTRAST CT CERVICAL SPINE WITHOUT CONTRAST TECHNIQUE: Multidetector CT imaging of the head and cervical spine was performed following the standard protocol without intravenous contrast. Multiplanar CT image reconstructions of the cervical spine were also generated. RADIATION DOSE REDUCTION: This exam was performed according to the departmental dose-optimization program which includes automated exposure control, adjustment of the mA and/or kV according to patient size and/or use of iterative reconstruction technique. COMPARISON:  Radiograph 08/18/2018, CT brain 09/13/2024 FINDINGS: CT HEAD FINDINGS Brain: No acute territorial infarction, hemorrhage or intracranial mass. Mild atrophy. Stable ventricle size. Partially empty sella Vascular: No hyperdense vessels.  Carotid vascular calcification. Skull: Normal. Negative for fracture or focal lesion. Sinuses/Orbits: No acute finding. Other: None CT CERVICAL SPINE FINDINGS Alignment: Straightening of the cervical spine. No subluxation. Facet alignment is normal Skull base and vertebrae: No acute  fracture. No primary bone lesion or focal pathologic process. Soft tissues and spinal canal: No prevertebral fluid or swelling. No visible canal hematoma. Disc levels: Moderate severe diffuse disc space narrowing C3 through T1. No high-grade canal stenosis Upper chest: Negative. Other: None IMPRESSION: 1. No CT evidence for acute intracranial abnormality. Mild atrophy. 2. Straightening of the cervical spine with degenerative changes. No acute osseous abnormality. Electronically Signed   By: Luke Bun M.D.   On: 09/15/2024 17:19   CT Cervical Spine Wo Contrast Result Date: 09/15/2024 CLINICAL DATA:  Poly trauma fall dizzy EXAM: CT HEAD WITHOUT CONTRAST CT CERVICAL SPINE WITHOUT CONTRAST TECHNIQUE: Multidetector CT  imaging of the head and cervical spine was performed following the standard protocol without intravenous contrast. Multiplanar CT image reconstructions of the cervical spine were also generated. RADIATION DOSE REDUCTION: This exam was performed according to the departmental dose-optimization program which includes automated exposure control, adjustment of the mA and/or kV according to patient size and/or use of iterative reconstruction technique. COMPARISON:  Radiograph 08/18/2018, CT brain 09/13/2024 FINDINGS: CT HEAD FINDINGS Brain: No acute territorial infarction, hemorrhage or intracranial mass. Mild atrophy. Stable ventricle size. Partially empty sella Vascular: No hyperdense vessels.  Carotid vascular calcification. Skull: Normal. Negative for fracture or focal lesion. Sinuses/Orbits: No acute finding. Other: None CT CERVICAL SPINE FINDINGS Alignment: Straightening of the cervical spine. No subluxation. Facet alignment is normal Skull base and vertebrae: No acute fracture. No primary bone lesion or focal pathologic process. Soft tissues and spinal canal: No prevertebral fluid or swelling. No visible canal hematoma. Disc levels: Moderate severe diffuse disc space narrowing C3 through T1. No high-grade canal stenosis Upper chest: Negative. Other: None IMPRESSION: 1. No CT evidence for acute intracranial abnormality. Mild atrophy. 2. Straightening of the cervical spine with degenerative changes. No acute osseous abnormality. Electronically Signed   By: Luke Bun M.D.   On: 09/15/2024 17:19   DG Chest Portable 1 View Result Date: 09/15/2024 EXAM: 1 VIEW(S) XRAY OF THE CHEST 09/15/2024 03:23:07 PM COMPARISON: 07/21/2024 CLINICAL HISTORY: fever FINDINGS: LUNGS AND PLEURA: Low lung volumes. No focal pulmonary opacity. No pulmonary edema. No pleural effusion. No pneumothorax. HEART AND MEDIASTINUM: No acute abnormality of the cardiac and mediastinal silhouettes. BONES AND SOFT TISSUES: Bilateral shoulder  degenerative changes. Thoracic spondylosis. IMPRESSION: 1. No acute cardiopulmonary process. 2. Low lung volumes. Electronically signed by: Waddell Calk MD 09/15/2024 03:51 PM EDT RP Workstation: [id number]   CT HEAD WO CONTRAST ( ) Result Date: 09/13/2024 EXAM: CT HEAD WITHOUT CONTRAST 09/13/2024 01:19:00 PM TECHNIQUE: CT of the head was performed without the administration of intravenous contrast. Automated exposure control, iterative reconstruction, and/or weight based adjustment of the mA/kV was utilized to reduce the radiation dose to as low as reasonably achievable. COMPARISON: None available. CLINICAL HISTORY: Pt fell 1 day ago dizziness ; No hx of strokes or seizures. FINDINGS: BRAIN AND VENTRICLES: No acute hemorrhage. No evidence of acute infarct. Mild cerebral volume loss with ex vacuo ventricular dilatation. Partial empty sella. No extra-axial collection. No mass effect or midline shift. ORBITS: Status post bilateral lens replacements. SINUSES: No acute abnormality. SOFT TISSUES AND SKULL: No acute soft tissue abnormality. Hyperostosis frontalis. No skull fracture. IMPRESSION: 1. No acute intracranial abnormality related to the reported fall and dizziness. 2. Mild cerebral volume loss with ex vacuo ventricular dilatation and partial empty sella. Electronically signed by: Franky Stanford MD 09/13/2024 01:33 PM EDT RP Workstation: [id number]    Microbiology: Recent Results (from the past 240 hours)  Blood  Culture (routine x 2)     Status: None   Collection Time: 10/03/24  5:51 PM   Specimen: BLOOD  Result Value Ref Range Status   Specimen Description   Final    BLOOD LEFT ANTECUBITAL Performed at [facility], [address] [place], [place] [address]    Special Requests   Final    BOTTLES DRAWN AEROBIC AND ANAEROBIC Blood Culture adequate volume Performed at [facility], [address] [place], [place] [address]    Culture   Final    NO GROWTH  5 DAYS Performed at [facility], [address], [place], [place] [address]    Report Status 10/08/2024 FINAL  Final  Resp panel by RT-PCR (RSV, Flu A&B, Covid) Anterior Nasal Swab     Status: None   Collection Time: 10/03/24  6:50 PM   Specimen: Anterior Nasal Swab  Result Value Ref Range Status   SARS Coronavirus 2 by RT PCR NEGATIVE NEGATIVE Final    Comment: (NOTE) SARS-CoV-2 target nucleic acids are NOT DETECTED.  The SARS-CoV-2 RNA is generally detectable in upper respiratory specimens during the acute phase of infection. The lowest concentration of SARS-CoV-2 viral copies this assay can detect is 138 copies/mL. A negative result does not preclude SARS-Cov-2 infection and should not be used as the sole basis for treatment or other patient management decisions. A negative result may occur with  improper specimen collection/handling, submission of specimen other than nasopharyngeal swab, presence of viral mutation(s) within the areas targeted by this assay, and inadequate number of viral copies(<138 copies/mL). A negative result must be combined with clinical observations, patient history, and epidemiological information. The expected result is Negative.  Fact Sheet for Patients:  bloggercourse.com  Fact Sheet for Healthcare Providers:  seriousbroker.it  This test is no t yet approved or cleared by the [place]  FDA and  has been authorized for detection and/or diagnosis of SARS-CoV-2 by FDA under an Emergency Use Authorization (EUA). This EUA will remain  in effect (meaning this test can be used) for the duration of the COVID-19 declaration under Section 564(b)(1) of the Act, 21 U.S.C.section 360bbb-3(b)(1), unless the authorization is terminated  or revoked sooner.       Influenza A by PCR NEGATIVE NEGATIVE Final   Influenza B by PCR NEGATIVE NEGATIVE Final    Comment: (NOTE) The Xpert Xpress  SARS-CoV-2/FLU/RSV plus assay is intended as an aid in the diagnosis of influenza from Nasopharyngeal swab specimens and should not be used as a sole basis for treatment. Nasal washings and aspirates are unacceptable for Xpert Xpress SARS-CoV-2/FLU/RSV testing.  Fact Sheet for Patients: bloggercourse.com  Fact Sheet for Healthcare Providers: seriousbroker.it  This test is not yet approved or cleared by the [place]  FDA and has been authorized for detection and/or diagnosis of SARS-CoV-2 by FDA under an Emergency Use Authorization (EUA). This EUA will remain in effect (meaning this test can be used) for the duration of the COVID-19 declaration under Section 564(b)(1) of the Act, 21 U.S.C. section 360bbb-3(b)(1), unless the authorization is terminated or revoked.     Resp Syncytial Virus by PCR NEGATIVE NEGATIVE Final    Comment: (NOTE) Fact Sheet for Patients: bloggercourse.com  Fact Sheet for Healthcare Providers: seriousbroker.it  This test is not yet approved or cleared by the [place]  FDA and has been authorized for detection and/or diagnosis of SARS-CoV-2 by FDA under an Emergency Use Authorization (EUA). This EUA will remain in effect (meaning  this test can be used) for the duration of the COVID-19 declaration under Section 564(b)(1) of the Act, 21 U.S.C. section 360bbb-3(b)(1), unless the authorization is terminated or revoked.  Performed at [facility], [address], [place], [place] [address]   Urine Culture     Status: None   Collection Time: 10/03/24  8:55 PM   Specimen: Urine, Random  Result Value Ref Range Status   Specimen Description   Final    URINE, RANDOM Performed at [facility], [address], [place], [place] [address]    Special Requests   Final    NONE Reflexed from [id number] Performed at [facility], [address], [place], [place] [address]    Culture   Final    NO GROWTH Performed at [facility], [address], [place], [place] [address]    Report Status 10/05/2024 FINAL  Final  Blood Culture (routine x 2)     Status: None   Collection Time: 10/04/24  4:31 AM   Specimen: BLOOD LEFT HAND  Result Value Ref Range Status   Specimen Description   Final    BLOOD LEFT HAND Performed at [facility], [address], [place], [place] [address]    Special Requests   Final    Blood Culture adequate volume BOTTLES DRAWN AEROBIC AND ANAEROBIC Performed at [facility], [address] [place], [place] [address]    Culture   Final    NO GROWTH 5 DAYS Performed at [facility], [address], [place], [place] [address]    Report Status 10/09/2024 FINAL  Final  Culture, blood (Routine X 2) w Reflex to ID Panel     Status: None   Collection Time: 10/06/24  1:45 PM   Specimen: BLOOD LEFT ARM  Result Value Ref Range Status   Specimen Description   Final    BLOOD LEFT ARM Performed at [facility], [address] [place], [place] [address]    Special Requests   Final    BOTTLES DRAWN AEROBIC ONLY Blood Culture adequate volume Performed at [facility], [address], [place], [place] [address]    Culture   Final    NO GROWTH 5 DAYS Performed at [facility], [address] [place], [place] [address]    Report Status 10/11/2024 FINAL  Final  Culture, blood (Routine X 2) w Reflex to ID Panel     Status: None   Collection Time: 10/06/24  1:56 PM   Specimen: BLOOD LEFT ARM  Result Value Ref Range Status   Specimen Description   Final    BLOOD LEFT ARM Performed at [facility], [address] [place], [place] [address]    Special Requests   Final    BOTTLES DRAWN AEROBIC AND ANAEROBIC Blood Culture adequate volume Performed at [facility], [address], [place], [place] [address]    Culture   Final    NO GROWTH 5 DAYS Performed at [facility], [address], [place], [place] [address]    Report Status 10/11/2024 FINAL  Final  Body fluid culture w Gram Stain     Status: None   Collection Time: 10/07/24 12:24 PM   Specimen: PATH Cytology Pleural fluid  Result Value Ref Range Status   Specimen Description   Final    PLEURAL Performed at [facility], [address] [place], [place] [address]    Special Requests   Final    NONE Performed at [facility], [address], [place], [place] [address]    Gram Stain   Final    FEW WBC PRESENT, PREDOMINANTLY MONONUCLEAR NO ORGANISMS SEEN    Culture   Final    NO GROWTH 3 DAYS Performed at [facility], [address] [place], [place] [address]    Report Status 10/10/2024 FINAL  Final  Respiratory (~20 pathogens) panel by PCR     Status: None   Collection Time: 10/07/24 11:59 PM   Specimen: Nasopharyngeal Swab; Respiratory  Result Value Ref Range Status   Adenovirus NOT DETECTED NOT DETECTED Final   Coronavirus 229E NOT DETECTED NOT DETECTED Final    Comment: (NOTE) The Coronavirus on the Respiratory Panel, DOES NOT test for the novel  Coronavirus (2019 nCoV)    Coronavirus HKU1 NOT DETECTED NOT DETECTED Final   Coronavirus NL63 NOT DETECTED NOT DETECTED Final   Coronavirus OC43 NOT DETECTED NOT DETECTED Final   Metapneumovirus NOT DETECTED NOT DETECTED Final   Rhinovirus / Enterovirus NOT DETECTED NOT DETECTED Final   Influenza A NOT DETECTED NOT DETECTED Final   Influenza B NOT DETECTED NOT DETECTED Final   Parainfluenza Virus 1 NOT DETECTED NOT DETECTED Final   Parainfluenza Virus 2 NOT DETECTED NOT DETECTED Final   Parainfluenza Virus 3 NOT DETECTED NOT DETECTED Final   Parainfluenza Virus 4 NOT DETECTED NOT DETECTED Final   Respiratory Syncytial Virus NOT DETECTED NOT DETECTED Final   Bordetella pertussis NOT  DETECTED NOT DETECTED Final   Bordetella Parapertussis NOT DETECTED NOT DETECTED Final   Chlamydophila pneumoniae NOT DETECTED NOT DETECTED Final   Mycoplasma pneumoniae NOT DETECTED NOT DETECTED Final    Comment: Performed at [facility], [address] [place], [place] [address]    Time spent: 32 minutes  Signed: Camellia PARAS Debara Kamphuis, DO 2024-10-30

## 2024-10-24 NOTE — Progress Notes (Signed)
 PROGRESS NOTE    Erica [name]  FMW:[id number] DOB: 1946-11-03 DOA: 10/03/2024 PCP: Arloa Elsie SAUNDERS, MD    Brief Narrative:   Erica [name] is a 78 y.o. 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  Recently hospitalized with symptomatic anemia, sepsis treated with empiric antibiotics and blood transfusion 2 weeks prior.  In the ED, temperature 9 9.6 F, HR 105, RR 16, BP 115/55, SpO2 99% on room air.  VBG with pH 7.38, pCO2 45, pO2 less than 31.  WBC 4.7, hemoglobin 6.5, plate count 850.  Sodium 133, potassium 4.3, chloride 95, CO2 26, glucose 153, BUN 13, creatinine 0.67.  AST 37, ALT 9, total bilirubin 0.7.  Lactic acid 1.9.  COVID/influenza/RSV PCR negative.  Urinalysis with trace leukocytes, negative nitrite, many bacteria, 11-20 WBCs.  FOBT negative.  CT head without contrast with no acute intracranial abnormality.  Blood cultures were drawn.  Patient was started on azithromycin and ceftriaxone.  MRI brain, lower extremity venous Dopplers were ordered.  TRH was consulted for admission.  Assessment & Plan:   Acute metabolic encephalopathy, POA Nonspecific diffusion abnormality MRI Patient presenting with confusion, CT head without contrast negative.  Also concern for infection with pneumonia and UTI which was adequately treated with no significant improvement in mentation.  MRI brain without contrast with punctate left body corpus callosum diffusion abnormality suspected although not well correlated on T2/FLAIR which is nonspecific with possible etiologies including metabolic disturbance, small vessel ischemia, seizure, infection, demyelination or radiation/chemotherapy.  Seen by neurology, underwent EEG with no seizure or epileptiform discharges noted.  Neurology believes delirium/confusion in the  setting of her underlying medical issues and signed off.  Now transitioning to comfort measures.  Adult failure to thrive Severe protein calorie malnutrition Body mass index is 32.42 kg/m. Nutrition Status: Nutrition Problem: Inadequate oral intake Etiology: poor appetite, lethargy/confusion Signs/Symptoms: meal completion < 25% Interventions: Refer to RD note for recommendations, Boost Breeze, MVI Patient was seen by speech therapy, has severe dysphagia and unable to tolerate oral intake safely.  Patient has medical directives within the EMR advising against artificial means of nutrition including feeding tubes.  Given that she is unable to tolerate any oral intake with progressive failure to thrive, transitioning to comfort measures in which patient's son Erica [name]) in agreement.  Symptomatic anemia Hx CML On Ponatinib  at basline.  Transfused 1 unit PRBC on 11/11 followed by an additional 1 units on 11/12.  Sepsis, POA Commune acquired pneumonia Possible urinary tract infection Right pleural effusion Patient presenting with fever, tachycardia.  Chest x-ray with hazy patchy opacity right lung base and small right pleural effusion.  Urine culture not performed.  Blood cultures remain negative.  Underwent thoracentesis on 11/14 with 500 mL removed, pleural fluid culture negative.  Completed 5-day course of azithromycin and 7-day course of IV ceftriaxone.  Seen by infectious disease with recommendation of no further antibiotics or further workup.  DM2 Hemoglobin A1c 6.9% on 10/04/2024.  Diet controlled at baseline.  HLD Not on medication outpatient.   DVT prophylaxis: Comfort measures    Code Status: Do not attempt resuscitation (DNR) - Comfort care Family Communication: Updated patient's son (POA) at bedside  Disposition Plan:  Level of care: Palliative Care Status is: Inpatient Remains inpatient appropriate because: Transition to comfort measures    Consultants:   Neurology Infectious  disease Palliative care Interventional radiology  Procedures:  EEG Thoracentesis  Antimicrobials:  Azithromycin  11/10 - 11/14 Ceftriaxone  11/10 - 11/17   Subjective: Patient seen examined bedside, lying in bed.  Confused, appears to be in respiratory distress.  RN present at bedside as well as patient's son.  Discussed overall course and continued worsening/progression of her overall health.  Unfortunately has not responded to aggressive medical treatment including IV antibiotics and extensive encephalopathic workup.  Given progression and patient's distress discussed with her son, Erica regarding plans moving forward.  Given her declination to having any artificial means to survive including feeding tubes would recommend transitioning to comfort measures which he is agreeable as stated in her advance directives.  Objective: Vitals:   10/11/24 2002 10-29-2024 0018 2024-10-29 0423 2024-10-29 0826  BP: (!) 91/56 (!) 105/56 (!) 103/47 (!) 106/59  Pulse: (!) 119 77 (!) 121 (!) 121  Resp: 16 16 15 20   Temp: 98.8 F (37.1 C) 98.5 F (36.9 C) 98.3 F (36.8 C) 98.4 F (36.9 C)  TempSrc: Oral Oral  Oral  SpO2: 99% 99% 99% 95%  Weight:      Height:        Intake/Output Summary (Last 24 hours) at October 29, 2024 1228 Last data filed at 10/29/2024 1111 Gross per 24 hour  Intake 290.55 ml  Output 450 ml  Net -159.45 ml   Filed Weights   10/04/24 1434  Weight: 72.8 kg    Examination:  Physical Exam: GEN: Chronically ill appearance, mild respiratory distress HEENT: NCAT, PERRL, EOMI, sclera clear, dry mucous membranes PULM: Diminished breath sounds bilateral bases, increased respiratory effort without accessory muscle use, on 2 L nasal cannula with SpO2 95% CV: Tachycardic, regular rhythm GI: abd soft, NTTP, nondistended, + BS    Data Reviewed: I have personally reviewed following labs and imaging studies  CBC: Recent Labs  Lab 10/06/24 0751 10/07/24 0408  10/10/24 0401 10/11/24 1620  WBC 3.8* 3.5* 5.1 6.1  NEUTROABS  --  1.7 2.2  --   HGB 9.2* 9.0* 8.8* 8.8*  HCT 26.5* 26.3* 26.9* 28.5*  MCV 84.7 85.4 87.1 92.5  PLT 103* 101* 86* 74*   Basic Metabolic Panel: Recent Labs  Lab 10/06/24 0751 10/07/24 0408 10/10/24 0401 Oct 29, 2024 0414  NA 135 133* 135 136  K 3.7 3.8 4.6 4.8  CL 98 97* 96* 96*  CO2 21* 22 19* 20*  GLUCOSE 97 118* 148* 176*  BUN 10 13 23  35*  CREATININE 0.57 0.57 0.75 0.95  CALCIUM 8.6* 8.4* 8.7* 9.0  MG 2.1 2.0 2.3  --   PHOS 2.3*  --  2.3*  --    GFR: Estimated Creatinine Clearance: 43.1 mL/min (by C-G formula based on SCr of 0.95 mg/dL). Liver Function Tests: Recent Labs  Lab 10/06/24 0751 10/07/24 0408  AST  --  29  ALT  --  7  ALKPHOS  --  62  BILITOT  --  0.6  PROT  --  5.6*  ALBUMIN 3.0* 2.8*   No results for input(s): LIPASE, AMYLASE in the last 168 hours. No results for input(s): AMMONIA in the last 168 hours. Coagulation Profile: No results for input(s): INR, PROTIME in the last 168 hours. Cardiac Enzymes: No results for input(s): CKTOTAL, CKMB, CKMBINDEX, TROPONINI in the last 168 hours. BNP (last 3 results) No results for input(s): PROBNP in the last 8760 hours. HbA1C: No results for input(s): HGBA1C in the last 72 hours. CBG: Recent Labs  Lab 10/11/24 0735 10/11/24 1205  10/11/24 1648 10/11/24 2012 November 11, 2024 0736  GLUCAP 156* 185* 173* 161* 176*   Lipid Profile: No results for input(s): CHOL, HDL, LDLCALC, TRIG, CHOLHDL, LDLDIRECT in the last 72 hours. Thyroid Function Tests: No results for input(s): TSH, T4TOTAL, FREET4, T3FREE, THYROIDAB in the last 72 hours. Anemia Panel: No results for input(s): VITAMINB12, FOLATE, FERRITIN, TIBC, IRON, RETICCTPCT in the last 72 hours. Sepsis Labs: Recent Labs  Lab 10/07/24 0408  PROCALCITON 0.90    Recent Results (from the past 240 hours)  Blood Culture (routine x 2)     Status:  None   Collection Time: 10/03/24  5:51 PM   Specimen: BLOOD  Result Value Ref Range Status   Specimen Description   Final    BLOOD LEFT ANTECUBITAL Performed at [facility], [address], [place], [place] [address]    Special Requests   Final    BOTTLES DRAWN AEROBIC AND ANAEROBIC Blood Culture adequate volume Performed at [facility], [address], [place], [place] [address]    Culture   Final    NO GROWTH 5 DAYS Performed at [facility], [address], [place], [place] [address]    Report Status 10/08/2024 FINAL  Final  Resp panel by RT-PCR (RSV, Flu A&B, Covid) Anterior Nasal Swab     Status: None   Collection Time: 10/03/24  6:50 PM   Specimen: Anterior Nasal Swab  Result Value Ref Range Status   SARS Coronavirus 2 by RT PCR NEGATIVE NEGATIVE Final    Comment: (NOTE) SARS-CoV-2 target nucleic acids are NOT DETECTED.  The SARS-CoV-2 RNA is generally detectable in upper respiratory specimens during the acute phase of infection. The lowest concentration of SARS-CoV-2 viral copies this assay can detect is 138 copies/mL. A negative result does not preclude SARS-Cov-2 infection and should not be used as the sole basis for treatment or other patient management decisions. A negative result may occur with  improper specimen collection/handling, submission of specimen other than nasopharyngeal swab, presence of viral mutation(s) within the areas targeted by this assay, and inadequate number of viral copies(<138 copies/mL). A negative result must be combined with clinical observations, patient history, and epidemiological information. The expected result is Negative.  Fact Sheet for Patients:  bloggercourse.com  Fact Sheet for Healthcare Providers:  seriousbroker.it  This test is no t yet approved or cleared by the [place]  FDA and  has been authorized for detection and/or  diagnosis of SARS-CoV-2 by FDA under an Emergency Use Authorization (EUA). This EUA will remain  in effect (meaning this test can be used) for the duration of the COVID-19 declaration under Section 564(b)(1) of the Act, 21 U.S.C.section 360bbb-3(b)(1), unless the authorization is terminated  or revoked sooner.       Influenza A by PCR NEGATIVE NEGATIVE Final   Influenza B by PCR NEGATIVE NEGATIVE Final    Comment: (NOTE) The Xpert Xpress SARS-CoV-2/FLU/RSV plus assay is intended as an aid in the diagnosis of influenza from Nasopharyngeal swab specimens and should not be used as a sole basis for treatment. Nasal washings and aspirates are unacceptable for Xpert Xpress SARS-CoV-2/FLU/RSV testing.  Fact Sheet for Patients: bloggercourse.com  Fact Sheet for Healthcare Providers: seriousbroker.it  This test is not yet approved or cleared by the [place]  FDA and has been authorized for detection and/or diagnosis of SARS-CoV-2 by FDA under an Emergency Use Authorization (EUA). This EUA will remain in effect (meaning this test can be used) for the duration  of the COVID-19 declaration under Section 564(b)(1) of the Act, 21 U.S.C. section 360bbb-3(b)(1), unless the authorization is terminated or revoked.     Resp Syncytial Virus by PCR NEGATIVE NEGATIVE Final    Comment: (NOTE) Fact Sheet for Patients: bloggercourse.com  Fact Sheet for Healthcare Providers: seriousbroker.it  This test is not yet approved or cleared by the [place]  FDA and has been authorized for detection and/or diagnosis of SARS-CoV-2 by FDA under an Emergency Use Authorization (EUA). This EUA will remain in effect (meaning this test can be used) for the duration of the COVID-19 declaration under Section 564(b)(1) of the Act, 21 U.S.C. section 360bbb-3(b)(1), unless the authorization is terminated  or revoked.  Performed at [facility], [address] [place], [place] [address]   Urine Culture     Status: None   Collection Time: 10/03/24  8:55 PM   Specimen: Urine, Random  Result Value Ref Range Status   Specimen Description   Final    URINE, RANDOM Performed at [facility], [address], [place], [place] [address]    Special Requests   Final    NONE Reflexed from [id number] Performed at [facility], [address], [place], [place] [address]    Culture   Final    NO GROWTH Performed at [facility], [address], [place], [place] [address]    Report Status 10/05/2024 FINAL  Final  Blood Culture (routine x 2)     Status: None   Collection Time: 10/04/24  4:31 AM   Specimen: BLOOD LEFT HAND  Result Value Ref Range Status   Specimen Description   Final    BLOOD LEFT HAND Performed at [facility], [address], [place], [place] [address]    Special Requests   Final    Blood Culture adequate volume BOTTLES DRAWN AEROBIC AND ANAEROBIC Performed at [facility], [address], [place], [place] [address]    Culture   Final    NO GROWTH 5 DAYS Performed at [facility], [address] [place], [place] [address]    Report Status 10/09/2024 FINAL  Final  Culture, blood (Routine X 2) w Reflex to ID Panel     Status: None   Collection Time: 10/06/24  1:45 PM   Specimen: BLOOD LEFT ARM  Result Value Ref Range Status   Specimen Description   Final    BLOOD LEFT ARM Performed at [facility], [address] [place], [place] [address]    Special Requests   Final    BOTTLES DRAWN AEROBIC ONLY Blood Culture adequate volume Performed at [facility], [address], [place], [place] [address]    Culture   Final    NO GROWTH 5 DAYS Performed at [facility], [address] [place], [place] [address]    Report Status 10/11/2024  FINAL  Final  Culture, blood (Routine X 2) w Reflex to ID Panel     Status: None   Collection Time: 10/06/24  1:56 PM   Specimen: BLOOD LEFT ARM  Result Value Ref Range Status   Specimen Description   Final    BLOOD LEFT ARM Performed at [facility], [address] [place], [place] [address]    Special Requests   Final    BOTTLES DRAWN AEROBIC AND ANAEROBIC Blood Culture adequate volume Performed at [facility], [address] [place], [place] [address]    Culture   Final    NO GROWTH 5 DAYS Performed at [facility], [address], [place], [place] [address]    Report Status 10/11/2024 FINAL  Final  Body fluid culture w Gram Stain     Status: None   Collection Time: 10/07/24 12:24 PM   Specimen: PATH Cytology Pleural fluid  Result Value Ref Range Status   Specimen Description   Final    PLEURAL Performed at [facility], [address], [place], [place] [address]    Special Requests   Final    NONE Performed at [facility], [address] [place], [place] [address]    Gram Stain   Final    FEW WBC PRESENT, PREDOMINANTLY MONONUCLEAR NO ORGANISMS SEEN    Culture   Final    NO GROWTH 3 DAYS Performed at [facility], [address], [place], [place] [address]    Report Status 10/10/2024 FINAL  Final  Respiratory (~20 pathogens) panel by PCR     Status: None   Collection Time: 10/07/24 11:59 PM   Specimen: Nasopharyngeal Swab; Respiratory  Result Value Ref Range Status   Adenovirus NOT DETECTED NOT DETECTED Final   Coronavirus 229E NOT DETECTED NOT DETECTED Final    Comment: (NOTE) The Coronavirus on the Respiratory Panel, DOES NOT test for the novel  Coronavirus (2019 nCoV)    Coronavirus HKU1 NOT DETECTED NOT DETECTED Final   Coronavirus NL63 NOT DETECTED NOT DETECTED Final   Coronavirus OC43 NOT DETECTED NOT DETECTED Final   Metapneumovirus NOT DETECTED NOT DETECTED Final    Rhinovirus / Enterovirus NOT DETECTED NOT DETECTED Final   Influenza A NOT DETECTED NOT DETECTED Final   Influenza B NOT DETECTED NOT DETECTED Final   Parainfluenza Virus 1 NOT DETECTED NOT DETECTED Final   Parainfluenza Virus 2 NOT DETECTED NOT DETECTED Final   Parainfluenza Virus 3 NOT DETECTED NOT DETECTED Final   Parainfluenza Virus 4 NOT DETECTED NOT DETECTED Final   Respiratory Syncytial Virus NOT DETECTED NOT DETECTED Final   Bordetella pertussis NOT DETECTED NOT DETECTED Final   Bordetella Parapertussis NOT DETECTED NOT DETECTED Final   Chlamydophila pneumoniae NOT DETECTED NOT DETECTED Final   Mycoplasma pneumoniae NOT DETECTED NOT DETECTED Final    Comment: Performed at [facility], [address] [place], [place] [address]         Radiology Studies: No results found.      Scheduled Meds:  furosemide   40 mg Intravenous BID   latanoprost   1 drop Both Eyes QHS   Continuous Infusions:   LOS: 9 days    Time spent: 52 minutes spent on 10-17-24 caring for this patient face-to-face including chart review, ordering labs/tests, documenting, discussion with nursing staff, consultants, updating family and interview/physical exam    Erica PARAS Shyah Cadmus, DO [facility] Available via Epic secure chat 7am-7pm After these hours, please refer to coverage provider listed on [email or web address] 10/17/2024, 12:28 PM

## 2024-10-24 NOTE — Plan of Care (Signed)
  Problem: Pain Managment: Goal: General experience of comfort will improve and/or be controlled Outcome: Progressing   Problem: Skin Integrity: Goal: Risk for impaired skin integrity will decrease Outcome: Progressing   Problem: Role Relationship: Goal: Family's ability to cope with current situation will improve Outcome: Progressing

## 2024-10-24 NOTE — Progress Notes (Signed)
    Patient Name: Erica [name]           DOB: 1946/05/17  MRN: [id number]       OVERNIGHT EVENT    Notified by RN that patient has expired at 2112.  2 RN verified. Patient was comfort care.   Family has been notified by RN.    Donnalee Cellucci, DNP, ACNPC- AG [facility] [place]

## 2024-10-24 NOTE — Progress Notes (Signed)
 Daily Progress Note   Patient Name: Erica [name]       Date: 2024/11/01 DOB: 1945/12/07  Age: 78 y.o. MRN#: [id number] Attending Physician: [place], Eric J, DO Primary Care Physician: Arloa Elsie SAUNDERS, MD Admit Date: 10/03/2024 Length of Stay: 9 days  Reason for Consultation/Follow-up: Establishing goals of care and Terminal Care  Subjective:   Reviewed EMR including documentation from hospitalist, SLP, and OT.  Discussed care with hospitalist, SLP, and RN for medical updates.  Patient has continued to medically deteriorate.  Minimal oral intake, more lethargic, and unable to take oral medications.  Hospitalist has discussed care with with son this morning to express concern that patient is at end-of-life and transitioning with her current medical evaluation.  Son agreeing with transition to comfort focused care.  Changing code status appropriately to DNR/DNI- comfort focused care.   This provider presented to bedside to see patient.  Patient nonverbal and clearly noted to have increased work of breathing.  Was able to discuss care with son at bedside to introduce myself as a member of the palliative medicine team.  Again confirmed comfort focused measures at this time and what that would and would not entail.  Discussed providing medications for symptom management at end-of-life.  Son inquiring about prognosis which expressed could be days to potentially a couple of weeks.  Son planning on calling family and to come and visit at this time.  Spent time providing emotional support via active listening.  Noted palliative medicine team to continue to follow along with patient's medical journey.  Objective:   Vital Signs:  BP (!) 106/59 (BP Location: Left Arm)   Pulse (!) 121   Temp 98.4 F (36.9 C) (Oral)   Resp 20   Ht 4' 11 (1.499 m)   Wt 72.8 kg   SpO2 95%   BMI 32.42 kg/m   Physical Exam: General: Awake, nonverbal, ill-appearing Cardiovascular: Tachycardia noted Respiratory:  increased work of breathing noted Neuro: Awake though nonverbal  Assessment & Plan:   Assessment: Patient is a 78 year old female with a past medical history of CML on ponatinib , diabetes mellitus type 2, hypertension, hyperlipidemia, and peripheral neuropathy who was admitted on 10/03/2024 home for management of profound generalized weakness, confusion, increased lower extremity edema, and a fall at home.  During hospitalization patient has received management for failure to thrive with associated severe protein calorie malnutrition, symptomatic anemia in the setting of CML, community-acquired pneumonia, possible urinary tract infection, right pleural effusion, acute metabolic encephalopathy and nonspecific diffusion abnormality on MRI.  Neurology and infectious disease consulted for recommendations.  Palliative medicine team consulted to assist with complex medical decision making.  Recommendations/Plan: # Complex medical decision making/goals of care:  # Complex medical decision making/goals of care  -Patient unable to participate in medical decision making secondary to medical status.  -Spoke with patient's son, Erica [name].  Patient appears to be at end-of-life.  Son has agreed with transition to full comfort focused care at this time.  Have appropriately updated orders accordingly.  Son reaching out to family members to come and visit patient at this time.  -At this time we will discontinue interventions that are no longer focused on comfort such as IV fluids, imaging, or lab work.  Will instead focus on symptom management of pain, dyspnea, and agitation in the setting of end-of-life care.    Code Status: Do not attempt resuscitation (DNR) - Comfort care  # Symptom management Patient is receiving these palliative interventions for symptom  management with an intent to improve quality of life.     -Pain/Dyspnea, acute in the setting of end-of-life care                               -Start IV  Dilaudid 0.5-2 mg mg IV every 2 minutes as needed.  Continue to adjust based on patient's symptom burden.  If patient needing frequent dosing, may need to consider continuous infusion.                  -Anxiety/agitation, in the setting of end-of-life care                               -Start IV Ativan 1 mg every 4 hours as needed. Continue to adjust based on patient's symptom burden.                                 -Start IV Haldol 1 mg every 4 hours as needed. Continue to adjust based on patient's symptom burden.                   -Secretions, in the setting of end-of-life care                               -Start IV glycopyrrolate 0.2 mg every 4 hours as needed.  # Psychosocial Support:  -Son  - Consulted chaplain for spiritual support  # Discharge Planning: Anticipated Hospital Death at this time as transition to full comfort focused care.  If appears stable once comfort focused measures enacted, may need to consider inpatient hospice transfer.  Discussed with: Patient's son, RN, SLP, hospitalist, chaplains  Thank you for allowing the palliative care team to participate in the care Heron VEAR Fetters.  Tinnie Radar, DO Palliative Care Provider PMT # [phone]  If patient remains symptomatic despite maximum doses, please call PMT at [phone] between 0700 and 1900. Outside of these hours, please call attending, as PMT does not have night coverage.  Billing based on MDM: High  Problems Addressed: One or more chronic illnesses with severe exacerbation, progression, or side effects of treatment.  Risks: Parenteral controlled substances

## 2024-10-24 NOTE — Progress Notes (Signed)
 Chaplain responded to spiritual consult request for emotional/spiritual support. I met with son Erica [name], who shared his thoughts and emotions regarding his mother's state of health. He is understandably in a state of shock. Given the situation (including having traveled to be here from his home in [place]), he appears to be processing everything in a healthy way. He shared many details from his own life story and relationship with pt Erica [name]. He welcomed prayer over Erica [name], which I happily provided, and expressed gratitude for chaplain presence.  I will follow up in the morning, however chaplains remain available as needs arise.

## 2024-10-24 DEATH — deceased

## 2024-11-11 ENCOUNTER — Other Ambulatory Visit: Payer: Self-pay

## 2024-12-23 ENCOUNTER — Other Ambulatory Visit: Payer: Self-pay

## 2025-02-07 ENCOUNTER — Ambulatory Visit: Admitting: Diagnostic Neuroimaging
